# Patient Record
Sex: Female | Born: 1945
Health system: Southern US, Community
[De-identification: ages and names within clinical notes are randomized; demographics above are authoritative.]

## PROBLEM LIST (undated history)

## (undated) DIAGNOSIS — E785 Hyperlipidemia, unspecified: Secondary | ICD-10-CM

## (undated) DIAGNOSIS — H269 Unspecified cataract: Secondary | ICD-10-CM

## (undated) DIAGNOSIS — B3781 Candidal esophagitis: Secondary | ICD-10-CM

## (undated) DIAGNOSIS — I739 Peripheral vascular disease, unspecified: Secondary | ICD-10-CM

## (undated) DIAGNOSIS — K219 Gastro-esophageal reflux disease without esophagitis: Secondary | ICD-10-CM

## (undated) DIAGNOSIS — Z8489 Family history of other specified conditions: Secondary | ICD-10-CM

## (undated) DIAGNOSIS — I10 Essential (primary) hypertension: Secondary | ICD-10-CM

## (undated) DIAGNOSIS — R55 Syncope and collapse: Secondary | ICD-10-CM

## (undated) DIAGNOSIS — M65341 Trigger finger, right ring finger: Secondary | ICD-10-CM

## (undated) DIAGNOSIS — K220 Achalasia of cardia: Secondary | ICD-10-CM

## (undated) DIAGNOSIS — N179 Acute kidney failure, unspecified: Secondary | ICD-10-CM

## (undated) DIAGNOSIS — U071 COVID-19: Secondary | ICD-10-CM

## (undated) HISTORY — PX: ABDOMINAL HYSTERECTOMY: SHX81

## (undated) HISTORY — DX: Candidal esophagitis: B37.81

## (undated) HISTORY — DX: Unspecified cataract: H26.9

## (undated) HISTORY — DX: Hyperlipidemia, unspecified: E78.5

## (undated) HISTORY — PX: TOTAL HIP ARTHROPLASTY: SHX124

## (undated) HISTORY — DX: Peripheral vascular disease, unspecified: I73.9

## (undated) HISTORY — DX: Gastro-esophageal reflux disease without esophagitis: K21.9

## (undated) HISTORY — PX: BREAST BIOPSY: SHX20

## (undated) HISTORY — PX: UPPER GASTROINTESTINAL ENDOSCOPY: SHX188

---
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
## 1997-11-13 ENCOUNTER — Emergency Department (HOSPITAL_COMMUNITY): Admission: EM | Admit: 1997-11-13 | Discharge: 1997-11-13 | Payer: Self-pay | Admitting: Emergency Medicine

## 2002-10-29 ENCOUNTER — Ambulatory Visit (HOSPITAL_COMMUNITY): Admission: RE | Admit: 2002-10-29 | Discharge: 2002-10-29 | Payer: Self-pay | Admitting: Internal Medicine

## 2003-07-06 ENCOUNTER — Emergency Department (HOSPITAL_COMMUNITY): Admission: EM | Admit: 2003-07-06 | Discharge: 2003-07-06 | Payer: Self-pay

## 2005-10-18 ENCOUNTER — Ambulatory Visit: Payer: Self-pay | Admitting: Internal Medicine

## 2005-11-22 ENCOUNTER — Ambulatory Visit: Payer: Self-pay | Admitting: Internal Medicine

## 2005-11-22 ENCOUNTER — Encounter (INDEPENDENT_AMBULATORY_CARE_PROVIDER_SITE_OTHER): Payer: Self-pay | Admitting: *Deleted

## 2007-06-07 ENCOUNTER — Encounter: Admission: RE | Admit: 2007-06-07 | Discharge: 2007-06-07 | Payer: Self-pay | Admitting: Internal Medicine

## 2007-09-20 ENCOUNTER — Inpatient Hospital Stay (HOSPITAL_COMMUNITY): Admission: RE | Admit: 2007-09-20 | Discharge: 2007-09-26 | Payer: Self-pay | Admitting: Orthopedic Surgery

## 2007-09-25 ENCOUNTER — Ambulatory Visit: Payer: Self-pay | Admitting: Surgery

## 2007-09-25 ENCOUNTER — Encounter (INDEPENDENT_AMBULATORY_CARE_PROVIDER_SITE_OTHER): Payer: Self-pay | Admitting: Orthopedic Surgery

## 2008-04-08 ENCOUNTER — Emergency Department (HOSPITAL_COMMUNITY): Admission: EM | Admit: 2008-04-08 | Discharge: 2008-04-08 | Payer: Self-pay | Admitting: Emergency Medicine

## 2008-06-16 ENCOUNTER — Encounter: Admission: RE | Admit: 2008-06-16 | Discharge: 2008-06-16 | Payer: Self-pay | Admitting: Orthopedic Surgery

## 2008-10-30 ENCOUNTER — Encounter (INDEPENDENT_AMBULATORY_CARE_PROVIDER_SITE_OTHER): Payer: Self-pay | Admitting: *Deleted

## 2009-08-14 ENCOUNTER — Telehealth: Payer: Self-pay | Admitting: Internal Medicine

## 2009-12-01 DIAGNOSIS — D126 Benign neoplasm of colon, unspecified: Secondary | ICD-10-CM | POA: Insufficient documentation

## 2009-12-01 DIAGNOSIS — M1990 Unspecified osteoarthritis, unspecified site: Secondary | ICD-10-CM | POA: Insufficient documentation

## 2009-12-01 DIAGNOSIS — K219 Gastro-esophageal reflux disease without esophagitis: Secondary | ICD-10-CM | POA: Insufficient documentation

## 2010-03-05 DIAGNOSIS — M545 Low back pain, unspecified: Secondary | ICD-10-CM | POA: Insufficient documentation

## 2010-03-23 ENCOUNTER — Encounter (INDEPENDENT_AMBULATORY_CARE_PROVIDER_SITE_OTHER): Payer: Self-pay | Admitting: *Deleted

## 2010-05-08 ENCOUNTER — Ambulatory Visit: Payer: Self-pay | Admitting: Internal Medicine

## 2010-05-08 ENCOUNTER — Encounter (INDEPENDENT_AMBULATORY_CARE_PROVIDER_SITE_OTHER): Payer: Self-pay | Admitting: *Deleted

## 2010-05-08 DIAGNOSIS — E119 Type 2 diabetes mellitus without complications: Secondary | ICD-10-CM

## 2010-05-08 DIAGNOSIS — K220 Achalasia of cardia: Secondary | ICD-10-CM

## 2010-05-08 DIAGNOSIS — Z860100 Personal history of colon polyps, unspecified: Secondary | ICD-10-CM | POA: Insufficient documentation

## 2010-05-08 DIAGNOSIS — R109 Unspecified abdominal pain: Secondary | ICD-10-CM

## 2010-05-08 HISTORY — DX: Achalasia of cardia: K22.0

## 2010-06-08 DIAGNOSIS — F5104 Psychophysiologic insomnia: Secondary | ICD-10-CM | POA: Insufficient documentation

## 2010-06-19 ENCOUNTER — Telehealth: Payer: Self-pay | Admitting: Internal Medicine

## 2010-06-23 ENCOUNTER — Telehealth: Payer: Self-pay | Admitting: Internal Medicine

## 2010-06-24 ENCOUNTER — Ambulatory Visit: Payer: Self-pay | Admitting: Internal Medicine

## 2010-06-28 ENCOUNTER — Encounter: Payer: Self-pay | Admitting: Internal Medicine

## 2010-09-03 NOTE — Procedures (Signed)
Summary: Colonoscopy: Adenoma   Colonoscopy  Procedure date:  11/22/2005  Findings:      Comments: 1) Four (4) polyps removed from right colon. 2) Otherwise normal. 3) 17 min withdrawal time from cecum, about 5-6 mins involved in polyp removal.  ***MICROSCOPIC EXAMINATION AND DIAGNOSIS*** 2. COLON, POLYP(S): MULTIPLE FRAGMENTS OF ADENOMATOUS POLYP(S).   NO HIGH GRADE DYSPLASIA OR INVASIVE MALIGNANCY IDENTIFIED.   (BIOPSIES AT APPENDIX)    3. COLON, POLYP: TWO PORTIONS OF TUBULOVILLOUS ADENOMA(S), AND MULTIPLE FRAGMENTS OF ADENOMATOUS POLYPS. NO HIGH GRADE DYSPLASIA OR MALIGNANCY IDENTIFIED. (BIOPSIES, ASCENDING)  Procedures Next Due Date:    Colonoscopy: 11/2008  Patient Name: Meggie, Laseter. MRN:  Procedure Procedures: Colonoscopy CPT: [phone].    with biopsy. CPT: Q5068410.    with polypectomy. CPT: A3573898.  Personnel: Endoscopist: Iva Boop, MD, [facility].  Exam Location: Exam performed in Outpatient Clinic. Outpatient  Patient Consent: Procedure, Alternatives, Risks and Benefits discussed, consent obtained, from patient. Consent was obtained by the RN.  Indications Symptoms: Constipation Rectal Bleed.  History  Current Medications: Patient is not currently taking Coumadin.  Allergies: No known allergies.  Pre-Exam Physical: Performed Nov 22, 2005. Cardio-pulmonary exam, Rectal exam, HEENT exam , Abdominal exam, Mental status exam WNL.  Comments: Pt. history reviewed/updated, physical exam performed prior to initiation of sedation? YES Exam Exam: Extent of exam reached: Cecum, extent intended: Cecum.  The cecum was identified by appendiceal orifice and IC valve. Patient position: on left side. Images taken. ASA Classification: II.  Monitoring: Pulse and BP monitoring, Oximetry used. Supplemental O2 given.  Colon Prep Used MiraLax for colon prep. Prep results: excellent.  Sedation Meds: Patient assessed and found to be appropriate for moderate  (conscious) sedation. Residual sedation present from prior procedure today.  Versed 2 mg. given IV. Fentanyl 50 mcg. given IV.  Findings - NORMAL EXAM: Ascending Colon to Rectum.  MULTIPLE POLYPS: Ascending Colon. minimum size 2 mm, maximum size 7 mm. Procedure:  snare with cautery, removed, Polyp retrieved, 2 polyps Polyps sent to pathology. ICD9: Neoplasia, Benign, Large Bowel: 211.3.  MULTIPLE POLYPS: Cecum. minimum size 2 mm, maximum size 3 mm. Procedure:  biopsy without cautery, removed, Polyp retrieved, 2 polyps ICD9: Neoplasia, Benign, Large Bowel: 211.3.   Assessment  Diagnoses: 211.3: Neoplasia, Benign, Large Bowel.   Comments: 1) Four (4) polyps removed from right colon. 2) Otherwise normal. 3) 17 min withdrawal time from cecum, about 5-6 mins involved in polyp removal.  Events  Unplanned Interventions: No intervention was required.  Plans  Post Exam Instructions: No aspirin or non-steroidal containing medications: 2 weeks.  Comments: Prilosec OTC qd Disposition: After procedure patient sent to recovery. After recovery patient sent home.  Scheduling/Referral: Await pathology to schedule patient. Path Letter, to The Patient,  Colonoscopy, to Iva Boop, MD, [facility], 3- 5 yrs,  Clinic Visit, to Iva Boop, MD, [facility], 4-6 weeks,   CC:   Jarome Matin, MD  This report was created from the original endoscopy report, which was reviewed and signed by the above listed endoscopist.

## 2010-09-03 NOTE — Letter (Signed)
Summary: Patient Notice- Polyp Results  [facility]  [address] [place], [place] [address]   Phone: [phone]  Fax: [phone]        June 28, 2010 MRN: [id number]    Catherine Ramsey [address] [place], [place]  [address]    Dear [name],  The polyps removed from your colon were adenomatous. This means that they were pre-cancerous or that  they had the potential to change into cancer over time.   I recommend that you have a repeat colonoscopy in 3 years to determine if you have developed any new polyps over time and screen for colorectal cancer. If you develop any new rectal bleeding, abdominal pain or significant bowel habit changes, please contact us before then.  Please call us if you are having persistent problems or have questions about your condition that have not been fully answered at this time.  Sincerely,  Iva Boop MD, [facility]  This letter has been electronically signed by your physician.  Appended Document: Patient Notice- Polyp Results Letter mailed

## 2010-09-03 NOTE — Procedures (Signed)
Summary: EGD: H.Pylori   EGD  Procedure date:  11/22/2005  Findings:      Comments: 1) Distal esophageal stenosis, possibly related to dysphagia. Dilate with 54 Fr Maloney 2) Submucosal nodule in proximal stomach (biopsied) Doubt related to symptoms 3) Otherwise ok  Patient Name: Catherine Ramsey, Catherine Ramsey. MRN:  Procedure Procedures: Panendoscopy (EGD) CPT: 43235.    with biopsy(s)/brushing(s). CPT: [id number].    with [facility] Dilation of Esophagus Personnel: Endoscopist: Iva Boop, MD, [facility].  Exam Location: Exam performed in Outpatient Clinic. Outpatient  Patient Consent: Procedure, Alternatives, Risks and Benefits discussed, consent obtained, from patient. Consent was obtained by the RN.  Indications Symptoms: Dysphagia. Abdominal pain,  History  Current Medications: Patient is not currently taking Coumadin.  Allergies: No known allergies.  Comments: Periumbilical abdominal pain Pre-Exam Physical: Performed Nov 22, 2005  Cardio-pulmonary exam, HEENT exam, Abdominal exam, Mental status exam WNL.  Comments: Pt. history reviewed/updated, physical exam performed prior to initiation of sedation? YES Exam Exam Info: Maximum depth of insertion Duodenum, intended Duodenum. Patient position: on left side. Gastric retroflexion performed. Images taken. ASA Classification: II. Tolerance: excellent.  Sedation Meds: Patient assessed and found to be appropriate for moderate (conscious) sedation. Fentanyl 50 mcg. given IV. Versed 5 mg. given IV. Cetacaine Spray 2 sprays given aerosolized.  Monitoring: BP and pulse monitoring done. Oximetry used. Supplemental O2 given  Findings - Normal: Proximal Esophagus to Distal Esophagus.  NODULE: Maximum size: 5 mm. submucosal nodule in Fundus. Erosions not present. Biopsy/Nodule taken. Comments: hard .  STRICTURE / STENOSIS: Stenosis in Distal Esophagus.   - Dilation: Distal Esophagus. for dysphagia without stricture. Maloney dilator  used, Diameter: 18 mm, Minimal Resistance, No Heme present on extraction. Patient tolerance excellent. Comments: z-line at 40 cm.  - Normal: Body to Duodenal 2nd Portion. Comments: some patchy gastric erythema (non-specific).   Assessment  Comments: 1) Distal esophageal stenosis, possibly related to dysphagia. Dilate with 54 Fr Maloney 2) Submucosal nodule in proximal stomach (biopsied) Doubt related to symptoms 3) Otherwise ok Events  Unplanned Intervention: No unplanned interventions were required.  Plans Instructions: Clear or full liquids: clears until 1030 then soft today.  Disposition: After procedure patient sent to recovery. After recovery patient sent home.  Scheduling: Colonoscopy, to Iva Boop, MD, [facility], next   CC:   Jarome Matin, MD  This report was created from the original endoscopy report, which was reviewed and signed by the above listed endoscopist.     Appended Document: EGD: H.Pylori   EGD  Procedure date:  11/22/2005  Findings:      Comments: 1) Distal esophageal stenosis, possibly related to dysphagia. Dilate with 54 Fr Maloney 2) Submucosal nodule in proximal stomach (biopsied) Doubt related to symptoms 3) Otherwise ok   ***MICROSCOPIC EXAMINATION AND DIAGNOSIS***    1. STOMACH: CHRONIC GASTRITIS WITH HELICOBACTER PYLORI. NO INTESTINAL METAPLASIA, DYSPLASIA OR MALIGNANCY IDENTIFIED.   (BIOPSIES, FUNDUS)

## 2010-09-03 NOTE — Assessment & Plan Note (Signed)
Summary: lower abd pain,reflux...as.   History of Present Illness Visit Type: Initial Visit Primary GI MD: Stan Head MD [facility] Primary Adya Wirz: Jarome Matin, MD Chief Complaint: Abdominal pain upper region, dysphagia, and rectal bleeding History of Present Illness:   65 yo African-american woman with solid food dysphaga, especially meats. Helped by water and chewing well. Will regurgitate food bolus at times. Has had some heartburn at times but not much. Abdomen is also sore and achy most of the time. Has not been on a PPI in some time Remembers improvement after H. pyloi Rx 2007. Occasional constipation, chronically. Relieved with increased roughage.   GI Review of Systems    Reports abdominal pain, dysphagia with solids, and  weight gain.     Location of  Abdominal pain: upper abdomen.    Denies acid reflux, belching, bloating, chest pain, dysphagia with liquids, heartburn, loss of appetite, nausea, vomiting, vomiting blood, and  weight loss.      Reports constipation, hemorrhoids, and  rectal bleeding.     Denies anal fissure, black tarry stools, change in bowel habit, diarrhea, diverticulosis, fecal incontinence, heme positive stool, irritable bowel syndrome, jaundice, light color stool, liver problems, and  rectal pain. Preventive Screening-Counseling & Management  Alcohol-Tobacco     Smoking Status: never    Clinical Reports Reviewed:  Colonoscopy:  11/22/2005:  Comments: 1) Four (4) polyps removed from right colon. 2) Otherwise normal. 3) 17 min withdrawal time from cecum, about 5-6 mins involved in polyp removal.  ***MICROSCOPIC EXAMINATION AND DIAGNOSIS*** 2. COLON, POLYP(S): MULTIPLE FRAGMENTS OF ADENOMATOUS POLYP(S).   NO HIGH GRADE DYSPLASIA OR INVASIVE MALIGNANCY IDENTIFIED.   (BIOPSIES AT APPENDIX)    3. COLON, POLYP: TWO PORTIONS OF TUBULOVILLOUS ADENOMA(S), AND MULTIPLE FRAGMENTS OF ADENOMATOUS POLYPS. NO HIGH GRADE DYSPLASIA OR MALIGNANCY IDENTIFIED.  (BIOPSIES, ASCENDING)  EGD:  11/22/2005:  Comments: 1) Distal esophageal stenosis, possibly related to dysphagia. Dilate with 54 Fr Maloney 2) Submucosal nodule in proximal stomach (biopsied) Doubt related to symptoms 3) Otherwise ok   ***MICROSCOPIC EXAMINATION AND DIAGNOSIS***    1. STOMACH: CHRONIC GASTRITIS WITH HELICOBACTER PYLORI. NO INTESTINAL METAPLASIA, DYSPLASIA OR MALIGNANCY IDENTIFIED.   (BIOPSIES, FUNDUS)   Current Medications (verified): 1)  Humulin R 100 Unit/ml Soln (Insulin Regular Human) .... Take As Directed 2)  Lisinopril-Hydrochlorothiazide 10-12.5 Mg Tabs (Lisinopril-Hydrochlorothiazide) .Marland Kitchen.. 1 By Mouth Once Daily 3)  Klor-Con 8 Meq Cr-Tabs (Potassium Chloride) .Marland Kitchen.. 1 Two Times A Day 4)  Atenolol 50 Mg Tabs (Atenolol) .Marland Kitchen.. 1 By Mouth Two Times A Day 5)  Amlodipine Besylate 10 Mg Tabs (Amlodipine Besylate) .Marland Kitchen.. 1 By Mouth Once Daily 6)  Novolog Mix 70/30 70-30 % Susp (Insulin Aspart Prot & Aspart) .... Take As Directed  Allergies (verified): No Known Drug Allergies  Past History:  Past Medical History: Arthritis Diabetes II Hyperlipidemia Hypertension Obesity Colon adenomas 2007 GERD/esophageal dilation H. pylori gastritis 2007 Tx - Omep/Biaxin/Amox x 10 d  Past Surgical History: Hysterectomy - fibroids Hip Replacement  Rt.  Family History: Reviewed history and no changes required. Family History of Diabetes:  No FH of Colon Cancer:  Social History: Reviewed history and no changes required. Married 4 boys 4 boys Retired Patient has never smoked.  Alcohol Use - no Daily Caffeine Use Smoking Status:  never  Review of Systems       The patient complains of arthritis/joint pain, back pain, and change in vision.         occasional bluury vision, if blood sugars high All other  ROS negative except as per HPI.   Vital Signs:  Patient profile:   65 year old female Height:      67.5 inches Weight:      216 pounds BMI:     33.45 Pulse  rate:   76 / minute Pulse rhythm:   regular BP sitting:   162 / 84  (left arm)  Vitals Entered By: Milford Cage NCMA (May 08, 2010 1:45 PM)  Physical Exam  General:  obese.  NAD Eyes:   no icterus. Mouth:  upper dentures, missing molares lower and remaining teeth fair no mucosal lesions in mouth or pharynx Neck:  Supple; no masses or thyromegaly. Lungs:  Clear throughout to auscultation. Heart:  Regular rate and rhythm; no murmurs, rubs,  or bruits. Abdomen:  obese, soft, mildly tender diffusely in upper and periumbilical area no HSM or mass Rectal:  deferred until time of colonoscopy.   Extremities:  trace LE edema Neurologic:  Alert and  oriented x3 Cervical Nodes:  No significant cervical or supraclavicular adenopathy.  Psych:  Alert and cooperative. Normal mood and affect.   Impression & Recommendations:  Problem # 1:  ABDOMINAL PAIN, UPPER (ICD-789.09) Assessment New  Suspect acid-peptic in origin. start PPI and EGD if unhelpful consider other work-up other possibilities include functional and/or neuropathic - does have mild paresthesias in LE's but has not had retinopathy/ laser Tx  Orders: Endo Savary  Dil/Colon (Endo Sav Dil/Col)  Problem # 2:  OTHER DYSPHAGIA (ICD-787.29)  Recurent problem though last seen and treated for this in 2007. She had responded to PPI and dilation of esophagel stenosis then. Restart PPI EGD/dili Risks, benefits,and indications of endoscopic procedure(s) were reviewed with the patient and all questions answered.  Orders: Endo Savary  Dil/Colon (Endo Sav Dil/Col)  Problem # 3:  TUBULOVILLOUS ADENOMA, COLON, HX OF (ICD-V12.72)  Orders: Endo Savary  Dil/Colon (Endo Sav Dil/Col)  Problem # 4:  SCREENING, COLON CANCER (ICD-V76.51)  Orders: Endo Savary  Dil/Colon (Endo Sav Dil/Col)  Patient Instructions: 1)  Please pick up your medications at your pharmacy. MOVIPREP 2)  We will see you at your procedure on 06/24/10 3)   Begin Omeprazole is directed below. 4)  [facility] Patient Information Guide given to patient.  5)  Colonoscopy and Flexible Sigmoidoscopy brochure given.  6)  Upper Endoscopy with Dilatation brochure given.  7)  Copy sent to : Jarome Matin, MD 8)  The medication list was reviewed and reconciled.  All changed / newly prescribed medications were explained.  A complete medication list was provided to the patient / caregiver. Prescriptions: MOVIPREP 100 GM  SOLR (PEG-KCL-NACL-NASULF-NA ASC-C) As per prep instructions.  #1 x 0   Entered by:   Francee Piccolo CMA (AAMA)   Authorized by:   Iva Boop MD, [facility]   Signed by:   Francee Piccolo CMA (AAMA) on 05/08/2010   Method used:   [facility]  [address] [phone]* (retail)       [address]       [place], [place]  [address]       Ph: [phone] or [phone]       Fax: [phone]   RxID:   [id number] OMEPRAZOLE 40 MG CPDR (OMEPRAZOLE) 1 by mouth once daily take 30-60 minutes before breakfast  #30 x 2   Entered and Authorized by:   Iva Boop  MD, [facility]   Signed by:   Iva Boop MD, FACG on 05/08/2010   Method used:   [facility]        [facility]  [address] [phone]* (retail)       [address]       [place], [place]  [address]       Ph: [phone] or [phone]       Fax: [phone]   RxID:   [id number]

## 2010-09-03 NOTE — Progress Notes (Signed)
Summary: Procedure questions  Phone Note Call from Patient Call back at Home Phone [phone]   Caller: Patient Call For: Dr. Leone Payor Summary of Call: PT wants to speak with nurse about procedures, wants to only follow through with one or the other due to insurance, is not employed and scared of medical bills Initial call taken by: Raechel Chute,  June 19, 2010 3:12 PM  Follow-up for Phone Call        patient states she is doing better with her swallowing.  She states she chews her meat very well and is doing much better.  She is worried about other medical bills and want to proceed with the colon only.  I have canceled the EGD at her request. Follow-up by: Darcey Nora RN, CGRN,  June 19, 2010 3:35 PM  Additional Follow-up for Phone Call Additional follow up Details #1::        ok  Additional Follow-up by: Iva Boop MD, [facility],  June 19, 2010 4:02 PM

## 2010-09-03 NOTE — Progress Notes (Signed)
Summary: Schedule Colonoscopy  Phone Note Outgoing Call Call back at Home Phone [phone]   Call placed by: Harlow Mares CMA Duncan Dull),  August 14, 2009 10:11 AM Call placed to: Patient Summary of Call: patient states that she is disabilied and she will be getting MCD in May so she asked if i would call her back in late May or early June. I will send myself a reminder to call her when we get that schedule and she has MCD. She is on Insulin but can be done as a direct since the previsit nurses have standing orders for insulin patients now.  Initial call taken by: Harlow Mares CMA (AAMA),  August 14, 2009 10:13 AM

## 2010-09-03 NOTE — Letter (Signed)
Summary: Diabetic Instructions  [facility]  [address] [place], [place] [address]   Phone: [phone]  Fax: [phone]    JALIZA SEIFRIED July 20, 1946 MRN: [id number]    _X_   INSULIN (LONG ACTING) MEDICATION INSTRUCTIONS (Lantus, NPH, 70/30, Humulin, Novolin-N)   The day before your procedure:   Take  your regular evening dose    The day of your procedure:   Do not take your morning dose    _X_   INSULIN (SHORT ACTING) MEDICATION INSTRUCTIONS (Regular, Humulog, Novolog)   The day before your procedure:   Do not take your evening dose   The day of your procedure:   Do not take your morning dose

## 2010-09-03 NOTE — Progress Notes (Signed)
Summary: Prep meds too expensive  Phone Note Call from Patient Call back at Home Phone [phone] Call back at or cell [phone]   Call For: Dr Leone Payor Summary of Call: Wonders if she can have the generic alternative to the Moviprep that was ordered for her procedure tomorrow? [facility] [address] Initial call taken by: Leanor Kail [facility],  June 23, 2010 8:25 AM  Follow-up for Phone Call        Spoke to pt.  Advised to drink half of Colyte tonight and 1/2 tomorrow following Moviprep instruction times.  Pt to take 2 dulcolax 2 hours prior to first dose of Colyte.  Pt voices understanding.  Prep and Dulcolax sent to pharm. Follow-up by: Francee Piccolo CMA Duncan Dull),  June 23, 2010 10:31 AM    New/Updated Medications: COLYTE WITH FLAVOR PACKS 240 GM  SOLR (PEG 3350-KCL-NABCB-NACL-NASULF) As per prep instructions. DULCOLAX 5 MG  TBEC (BISACODYL) Day before procedure take 2 at 3pm Prescriptions: DULCOLAX 5 MG  TBEC (BISACODYL) Day before procedure take 2 at 3pm  #2 x 0   Entered by:   Francee Piccolo CMA (AAMA)   Authorized by:   Iva Boop MD, [facility]   Signed by:   Francee Piccolo CMA (AAMA) on 06/23/2010   Method used:   [facility]        [facility]  [address] [phone]* (retail)       [address]       [place], [place]  [address]       Ph: [phone] or [phone]       Fax: [phone]   RxID:   [id number] COLYTE WITH FLAVOR PACKS 240 GM  SOLR (PEG 3350-KCL-NABCB-NACL-NASULF) As per prep instructions.  #1 x 0   Entered by:   Francee Piccolo CMA (AAMA)   Authorized by:   Iva Boop MD, [facility]   Signed by:   Francee Piccolo CMA (AAMA) on 06/23/2010   Method used:   [facility]        [facility]  [address] [phone]* (retail)       [address]       [place], [place]  [address]       Ph: [phone] or [phone]       Fax: [phone]   RxID:   [id number]

## 2010-09-03 NOTE — Letter (Signed)
Summary: [facility] Instructions  [facility]  [address] [place], [place] [address]   Phone: [phone]  Fax: [phone]       Catherine Ramsey    12-31-1945    MRN: [id number]      Procedure Day Dorna BloomLulu Riding, 06/24/10     Arrival Time: 10:30 AM      Procedure Time: 11:30 AM    Location of Procedure:                    _X_  [facility] (4th Floor)                     PREPARATION FOR COLONOSCOPY WITH MOVIPREP   Starting 5 days prior to your procedure 06/19/10 do not eat nuts, seeds, popcorn, corn, beans, peas,  salads, or any raw vegetables.  Do not take any fiber supplements (e.g. Metamucil, Citrucel, and Benefiber).  THE DAY BEFORE YOUR PROCEDURE         TUESDAY, 06/23/10  1.  Drink clear liquids the entire day-NO SOLID FOOD  2.  Do not drink anything colored red or purple.  Avoid juices with pulp.  No orange juice.  3.  Drink at least 64 oz. (8 glasses) of fluid/clear liquids during the day to prevent dehydration and help the prep work efficiently.  CLEAR LIQUIDS INCLUDE: Water Jello Ice Popsicles Tea (sugar ok, no milk/cream) Powdered fruit flavored drinks Coffee (sugar ok, no milk/cream) Gatorade Juice: apple, white grape, white cranberry  Lemonade Clear bullion, consomm, broth Carbonated beverages (any kind) Strained chicken noodle soup Hard Candy                           4.  In the morning, mix first dose of MoviPrep solution:    Empty 1 Pouch A and 1 Pouch B into the disposable container    Add lukewarm drinking water to the top line of the container. Mix to dissolve    Refrigerate (mixed solution should be used within 24 hrs)  5.  Begin drinking the prep at 5:00 p.m. The MoviPrep container is divided by 4 marks.   Every 15 minutes drink the solution down to the next mark (approximately 8 oz) until the full liter is complete.   6.  Follow completed prep with 16 oz of clear liquid of your choice (Nothing red or purple).   Continue to drink clear liquids until bedtime.  7.  Before going to bed, mix second dose of MoviPrep solution:    Empty 1 Pouch A and 1 Pouch B into the disposable container    Add lukewarm drinking water to the top line of the container. Mix to dissolve    Refrigerate  THE DAY OF YOUR PROCEDURE      WEDNESDAY, 06/24/10  Beginning at 6:30 a.m. (5 hours before procedure):         1. Every 15 minutes, drink the solution down to the next mark (approx 8 oz) until the full liter is complete.  2. Follow completed prep with 16 oz. of clear liquid of your choice.    3. You may drink clear liquids until 9:30AM (2 HOURS BEFORE PROCEDURE).  MEDICATION INSTRUCTIONS  Unless otherwise instructed, you should take regular prescription medications with a small sip of water   as early as possible the morning of your procedure.  Diabetic patients - see separate instructions.      OTHER  INSTRUCTIONS  You will need a responsible adult at least 63 years of age to accompany you and drive you home.   This person must remain in the waiting room during your procedure.  Wear loose fitting clothing that is easily removed.  Leave jewelry and other valuables at home.  However, you may wish to bring a book to read or  an iPod/MP3 player to listen to music as you wait for your procedure to start.  Remove all body piercing jewelry and leave at home.  Total time from sign-in until discharge is approximately 2-3 hours.  You should go home directly after your procedure and rest.  You can resume normal activities the  day after your procedure.  The day of your procedure you should not:   Drive   Make legal decisions   Operate machinery   Drink alcohol   Return to work  You will receive specific instructions about eating, activities and medications before you leave.  The above instructions have been reviewed and explained to me by   _______________________   I fully understand and can  verbalize these instructions _____________________________ Date _________

## 2010-09-03 NOTE — Letter (Signed)
Summary: New Patient letter  [facility]  [address] [place], [place] [address]   Phone: [phone]  Fax: [phone]       03/23/2010 MRN: [id number]  Catherine Ramsey [address] [place], [place]  [address]  Dear Ms. Laural Benes,  Welcome to the Gastroenterology Division at [facility].    You are scheduled to see Dr. Leone Payor on 05/08/2010 at 1:45PM on the 3rd floor at [facility], [address]  We ask that you try to arrive at our office 15 minutes prior to your appointment time to allow for check-in.  We would like you to complete the enclosed self-administered evaluation form prior to your visit and bring it with you on the day of your appointment.  We will review it with you.  Also, please bring a complete list of all your medications or, if you prefer, bring the medication bottles and we will list them.  Please bring your insurance card so that we may make a copy of it.  If your insurance requires a referral to see a specialist, please bring your referral form from your primary care physician.  Co-payments are due at the time of your visit and may be paid by cash, check or credit card.     Your office visit will consist of a consult with your physician (includes a physical exam), any laboratory testing he/she may order, scheduling of any necessary diagnostic testing (e.g. x-ray, ultrasound, CT-scan), and scheduling of a procedure (e.g. Endoscopy, Colonoscopy) if required.  Please allow enough time on your schedule to allow for any/all of these possibilities.    If you cannot keep your appointment, please call [phone] to cancel or reschedule prior to your appointment date.  This allows [place] the opportunity to schedule an appointment for another patient in need of care.  If you do not cancel or reschedule by 5 p.m. the business day prior to your appointment date, you will be charged a $50.00 late cancellation/no-show fee.    Thank you for choosing  [facility] for your medical needs.  We appreciate the opportunity to care for you.  Please visit [place] at our website  to learn more about our practice.                     Sincerely,                                                             The Gastroenterology Division

## 2010-09-03 NOTE — Procedures (Signed)
Summary: Colonoscopy  Patient: Catherine Ramsey Note: All result statuses are Final unless otherwise noted.  Tests: (1) Colonoscopy (COL)   COL Colonoscopy           DONE     [facility]     [address]     [place], [place]  [address]           COLONOSCOPY PROCEDURE REPORT           PATIENT:  [name], [name]  MR#:  [id number]     BIRTHDATE:  06/13/1946, 64 yrs. old  GENDER:  female     ENDOSCOPIST:  Iva Boop, MD, [facility]           PROCEDURE DATE:  06/24/2010     PROCEDURE:  Colonoscopy with biopsy and snare polypectomy     ASA CLASS:  Class II     INDICATIONS:  surveillance and high-risk screening, history of     pre-cancerous (adenomatous) colon polyps 4 polyps (2 TV adenoma     and 2 adenoma) max size 7 mm removed 2007     MEDICATIONS:   Fentanyl 75 mcg IV, Versed 6 mg           DESCRIPTION OF PROCEDURE:   After the risks benefits and     alternatives of the procedure were thoroughly explained, informed     consent was obtained.  Digital rectal exam was performed and     revealed no abnormalities.   The LB PCF-[id number] endoscope     was introduced through the anus and advanced to the cecum, which     was identified by both the appendix and ileocecal valve, without     limitations.  The quality of the prep was excellent, using     MoviPrep.  The instrument was then slowly withdrawn as the colon     was fully examined. Insertion: 8:53 minutes Withdrawal: 9:36     minutes     <<PROCEDUREIMAGES>>           FINDINGS:  Five polyps were found. All diminutive (5mm or less)     cecum (1), ascending (2), transverse (2). The polyps were removed     using cold biopsy forceps. Polyps were snared without cautery.     Retrieval was successful.  This was otherwise a normal examination     of the colon.   Retroflexed views in the rectum revealed no     abnormalities.    The scope was then withdrawn from the patient     and the procedure completed.        COMPLICATIONS:  None     ENDOSCOPIC IMPRESSION:     1) Five diminutive polyps removed     2) Otherwise normal examination with excellent prep     3) personal history of tubulovillous adenomas and tubular     adenomas (4) 2007.           REPEAT EXAM:  In for Colonoscopy, pending biopsy results.           Iva Boop, MD, [facility]           CC:  The Patient     Jarome Matin, MD           n.     Rosalie Doctor:   Iva Boop at 06/24/2010 01:00 PM           [place] No, [id number]  Note: An exclamation mark (!) indicates a result  that was not dispersed into the flowsheet. Document Creation Date: 06/24/2010 1:00 PM _______________________________________________________________________  (1) Order result status: Final Collection or observation date-time: 06/24/2010 12:46 Requested date-time:  Receipt date-time:  Reported date-time:  Referring Physician:   Ordering Physician: Stan Head [phone]) Specimen Source:  Source: Launa Grill Order Number: [id number] Lab site:   Appended Document: Colonoscopy   Colonoscopy  Procedure date:  06/24/2010  Findings:          1) Five diminutive polyps removed     2) Otherwise normal examination with excellent prep     3) personal history of tubulovillous adenomas and tubular     adenomas (4) 2007.  1. Colon, polyp(s), cecum, ascending (2), transverse (2) :  TUBULAR ADENOMAS AND A SERRATED ADENOMA.  NO HIGH GRADE DYSPLASIA OR MALIGNANCY IDENTIFIED.  Comments:      Repeat colonoscopy in 3 years.     Procedures Next Due Date:    Colonoscopy: 07/2013

## 2010-10-13 LAB — GLUCOSE, CAPILLARY
Glucose-Capillary: 178 mg/dL — ABNORMAL HIGH (ref 70–99)
Glucose-Capillary: 224 mg/dL — ABNORMAL HIGH (ref 70–99)

## 2010-12-15 NOTE — Op Note (Signed)
Catherine Ramsey, Catherine Ramsey               ACCOUNT NO.:  [id number]   MEDICAL RECORD NO.:  [id number]          PATIENT TYPE:  INP   LOCATION:  0005                         FACILITY:  [facility]   PHYSICIAN:  Ollen Gross, M.D.    DATE OF BIRTH:  Jul 14, 1946   DATE OF PROCEDURE:  09/20/2007  DATE OF DISCHARGE:                               OPERATIVE REPORT   PREOPERATIVE DIAGNOSIS:  Post-traumatic osteoarthritis right hip.   POSTOPERATIVE DIAGNOSIS:  Post-traumatic osteoarthritis right hip.   PROCEDURE:  Conversion of previous hip surgery to right total hip  arthroplasty.   SURGEON:  Ollen Gross, M.D.   ASSISTANT:  Avel Peace PA-C.   ANESTHESIA:  General.   ESTIMATED BLOOD LOSS:  400 mL.   DRAIN:  Hemovac x1.   COMPLICATIONS:  None.   CONDITION:  Stable to recovery room.   BRIEF CLINICAL NOTE:  Catherine Ramsey is a 65 year old female with severe end-  stage arthritic changes of the right hip.  She had a trauma many years  ago requiring hip pinning.  She had Knowles hip pins placed.  She healed  uneventfully but went on to develop severe post-traumatic arthritis.  She presents now for conversion of her previous operation to a total hip  arthroplasty.   PROCEDURE IN DETAIL:  After successful initiation of general anesthetic,  the patient is placed in the left lateral decubitus position with the  right side up and held with the hip positioner.  Right lower extremity  is isolated from her perineum with plastic drapes, prepped and draped in  the usual sterile fashion.  Standard posterolateral incision was made  with a 10 blade through subcutaneous tissue to the level of the fascia  lata which was incised in line with the skin incision.  Sciatic nerve  was palpated and protected and short rotators isolated off the femur.  Capsulectomy is performed and I attempted to dislocate the hip.  It  could not be dislocated due to severe ankylosis.  I then made an  incision in the fascia of the vastus  lateralis to find the heads for the  Knowles pins.  We found them and then I removed each of the pins without  any adverse effects.   Since we could not dislocate the hip, I cut the femoral head in situ.  The head is cut and then separated from the femoral neck and shaft.  I  retracted the neck and shaft anteriorly and then removed the head from  the acetabulum with the corkscrew device.  We used the head for bone  graft.  The retractors were placed around the acetabulum and then the  labrum and osteophytes removed.  Acetabular reaming begins at 45 mm  coursing increments of 2 up to 53 mm.  She was in protrusio to begin  with so I used some of the reamings and some of our graft from the  femoral head in order to graft medially to build the cup out to a normal  position.  A 54 mL pinnacle acetabular shell was then impacted in  anatomic position and transfixed with two dome  screws.  The apex hole  eliminator was placed and the permanent 36 mm neutral Ultramet metal  liner was placed for a metal-on-metal hip replacement.   The femur was then prepared with the canal finder and irrigation.  Axial  reaming was performed to 17.5 mm, proximal reaming to  a 22B and the  sleeve machine to a large.  A 22B large trial sleeve was placed with 22  x 17 stem and a 36 standard neck about 10 degrees beyond her native  anteversion.  The 36 + 0  trial head is placed in the hips reduced with  great stability.  There was full extension, full external rotation, 70  degrees flexion, 40 degrees adduction, 90 degrees internal rotation, 90  degrees of flexion and 70 degrees of internal rotation.  By placing the  right leg on top of the left I felt as though leg lengths were equal.  Hips then dislocated.  Trials removed.  The permanent 22B large sleeve  is placed and 22 x 17 stem and 36 standard neck about 10 degrees beyond  native anteversion.  Once the stem was impacted, then the permit 36 + 0  head is placed  and the hip is reduced in same stability parameters.  Wound was copiously irrigated with saline solution and the short  rotators reattached to the femur through drill holes.  Fascia lata was  closed over Hemovac drain with interrupted #1 Vicryl, subcu closed with  #1-0 and #2-0 Vicryl and subcuticular running 4-0 Monocryl.  Incision  was cleaned and dried and Steri-Strips and a bulky sterile dressing are  applied.  The patient is then placed into the knee immobilizer, awakened  and transported to recovery in stable condition.      Ollen Gross, M.D.  Electronically Signed     FA/MEDQ  D:  09/20/2007  T:  09/21/2007  Job:  [id number]

## 2010-12-15 NOTE — H&P (Signed)
Catherine Ramsey, Catherine Ramsey NO.:  [id number]   MEDICAL RECORD NO.:  [id number]          PATIENT TYPE:  INP   LOCATION:  0005                         FACILITY:  [facility]   PHYSICIAN:  Ollen Gross, M.D.    DATE OF BIRTH:  07-31-1946   DATE OF ADMISSION:  09/20/2007  DATE OF DISCHARGE:                              HISTORY & PHYSICAL   NOTE:  Date of office visit history and physical was performed on  September 15, 2007.  Date of admission is September 20, 2007.   CHIEF COMPLAINT:  Right hip pain.   HISTORY OF PRESENT ILLNESS:  The patient is a 65 year old female seen by  Dr. Lequita Halt for ongoing right hip pain.  She has known end-stage  arthritis.  The pain has been getting progressively worse over time.  She had a previous hip surgery with a hip pinning years ago at age 46  which sounds like it was for a slipped capital epiphysis.  She has  developed arthritis beyond that.  She does have three Knowles pins in.  Unfortunately she has gone on to develop arthritis and has reached the  point where she would like to proceed with surgical intervention.  Risks  and benefits have been discussed and surgery will be performed by Dr.  Ollen Gross.   ALLERGIES:  No known drug allergies.   CURRENT MEDICATIONS:  Lantus, NovoLog, atenolol, amlodipine, quinapril,  potassium.   PAST MEDICAL HISTORY:  Arthritis, hypertension, insulin dependent  diabetes mellitus.   PAST SURGICAL HISTORY:  Hip pinning around 1962, hysterectomy 1989,  colonoscopy 2007, also lumpectomy.   SOCIAL HISTORY:  Married, works as a Engineer, structural, nonsmoker.  No alcohol.  Eight children and family will be assisting with care after surgery.   FAMILY HISTORY:  Father with diabetes.  Has a son with diabetes and a  daughter with kidney disease.   REVIEW OF SYSTEMS:  GENERAL:  No fevers, chills, night sweats.  NEUROLOGICAL:  No seizures, syncope or paralysis.  RESPIRATORY:  No  shortness of breath, productive cough  or hemoptysis.  CARDIOVASCULAR:  No chest pain, orthopnea.  GI:  No nausea, vomiting, diarrhea or  constipation.  GU:  No dysuria, hematuria or discharge.  MUSCULOSKELETAL:  Right hip.   PHYSICAL EXAMINATION:  VITAL SIGNS:  Pulse 74, respirations 12, blood  pressure 160/80.  GENERAL:  A 65 year old African American female well-nourished, well-  developed, slightly overweight.  She is alert and cooperative and  pleasant.  HEENT:  Normocephalic, atraumatic.  Pupils are round and reactive.  EOMs  intact.  NECK:  Neck is supple.  No carotid bruits.  CHEST:  Clear.  Anterior and posterior chest reveals no rhonchi, rales  or wheezes.  She did have a slight kyphotic thoracic spine.  HEART:  Regular rate and rhythm with grade 2/6 systolic ejection murmur  best heard over aortic point.  ABDOMEN:  Soft, slightly round.  Bowel sounds present.  BREASTS:  Not done.  RECTAL:  Not done.  GENITALIA:  Not done.  EXTREMITIES:  Right hip flexion 90 to zero, internal rotation 10  degrees, external  rotation about 10 degrees of abduction and she does  ambulate with an antalgic gait.   IMPRESSION:  1. Osteoarthritis right hip.  2. Hypertension.  3. Insulin dependent diabetes mellitus.   PLAN:  The patient was admitted to [facility] to undergo  removal of the Knowles pins and conversion over to a right total hip  arthroplasty.  Surgery will be performed by Dr. Ollen Gross.      Alexzandrew L. Perkins, P.A.C.      Ollen Gross, M.D.  Electronically Signed    ALP/MEDQ  D:  09/20/2007  T:  09/20/2007  Job:  [id number]

## 2010-12-18 NOTE — Discharge Summary (Signed)
[name], [name]               ACCOUNT NO.:  [id number]   MEDICAL RECORD NO.:  [id number]          PATIENT TYPE:  INP   LOCATION:  1613                         FACILITY:  [facility]   PHYSICIAN:  Ollen Gross, M.D.    DATE OF BIRTH:  08/30/1945   DATE OF ADMISSION:  09/20/2007  DATE OF DISCHARGE:  09/26/2007                               DISCHARGE SUMMARY   ADMISSION DIAGNOSES:  1. Osteoarthritis of the right hip.  2. Hypertension.  3. Insulin-dependent diabetes mellitus.   DISCHARGE DIAGNOSES:  1. Osteoarthritis right hip posttraumatic status post conversion      previous hip surgery over to right total hip arthroplasty. 2.      Postop blood loss anemia.  2. Status post transfusion without sequelae.  3. Postop hyponatremia improved.  4. Postop fevers likely viral syndrome.  5. Hypertension.  6. Insulin-dependent diabetes mellitus.   PROCEDURE:  September 20, 2007 conversion of previous hip surgery over to  right total hip surgery.  Surgeon Dr. Lequita Halt, assisting Avel Peace PA-  C.  Anesthesia general.   CONSULTATIONS:  Medical Dr. Jarome Matin.   BRIEF HISTORY:  Catherine Ramsey is a 65 year old female with severe history of  arthritis of the right hip.  She had a trauma many years ago requiring  hip pinning.  She had pins placed.  Eventually healed but went on to  develop severe posttraumatic arthritis and now plans for conversion over  to a total hip.   LABORATORY DATA:  Preop CBC:  Hemoglobin 11.5, hematocrit 34.7, white  cell count 6.5, platelets 176, hemoglobin dropped down to 9.4, then 8.9,  back up to 9, and then as low as 8.2.  Given 2 units of blood.  Postoperatively hemoglobin back up to 10.3 and __________ 29.6.  PT/PTT  preop 14.3 and 25, respectively.  INR 1.1.  Serial protimes followed.  PT/INR 32.8 and 3.1.  D-dimers taken on September 24, 2007 elevated to  2.34.  Chem panel on admission:  Low albumin 3.3, elevated glucose 352,  known diabetic.  Remaining chem  panel within normal limits.  Serial  BMET's were followed.  Sodium did drop from 140 down to 132, back up to  138.  Glucose came down to 251 and then 143.  Last noted normal level  82.  Creatinine went up from 1.07 to 1.3 back down to 1.1.  B-type  natriuretic peptide taken on September 22, 2007 less than 30.  Preop UA:  Positive glucose, few bacteria, 0-2 red, 0-2 white cells; otherwise,  negative. Follow-up UA:  Large hemoglobin 0-2 white cells, 11-20 red  cells, rare bacteria.  Blood group type B+.  Blood cultures x2 on  September 22, 2007 no growth on five days.  Urine culture no growth.  Influenza type A and type B both negative.   Doppler ultrasound:  No evidence of lower extremity DVT or superficial  thrombosis, right lower extremity difficulty due to suboptimal  positioning, fluid-filled area consistent with ruptured Baker's cyst.  EKG September 13, 2007:  Normal sinus rhythm, moderate voltage criteria  for LVH.  No change since last tracing  of July 06, 2003 confirmed by  Dr. Lady Deutscher.  Preop hip films September 13, 2007:  Advanced  osteoarthritis right hip.  Two-view chest preop September 13, 2007:  No  active cardiopulmonary disease.  Portable chest and hip on September 20, 2007:  Right hip arthroplasty without evidence of complication.  Two-  view chest taken on September 22, 2007:  No active disease.  Follow-up  chest on September 23, 2007:  Mild cardiac enlargement without an acute  pulmonary process.  CT chest September 24, 2007:  No evidence of acute  pulmonary embolus, small nodule in the right lung felt to be benign,  gastroesophageal reflux, hiatal hernia.   HOSPITAL COURSE:  The patient admitted to [facility].  Tolerated the procedure well.  Later transferred to the recovery room of  the orthopedic floor.  Started on the PCA and p.o. analgesic pain  control following surgery.  Did run a temperature on the evening of  surgery of 102.4 back down to 99 on the  morning of day #1.  Encouraged  incentive spirometer.  Unable to get much rest.  A little bit of itching  through the night was a little bit better.  If the temp spiked again we  would do further workup.  Had a normal white count of 7.7.  Started on  iron since hemoglobin was down to 9.4.  Low urinary output.  Did a  little bit of mild diuresis with Lasix.  Pressure was up.  The evening  of surgery had to be given clonidine. Started back on her home  medications.  She is a diabetic.  Started on sliding scale insulin for  diabetic control.  By day  #2 doing a little bit better, but spiked again up to 102.5.  Ordered  chest x-ray.  White count went up from 7 to 12.2 with a little bit of  leukocytosis and spiked a temp.  Did chest x-ray and UA.  Chest x-ray  did prove to be negative.  UA also negative.  Still had some positive  volume so we checked a BNP which was less than 30 and was normal.  Sodium dropped probably due to dilutional component, but that was  stable.  Dressing change incision looked good.  Started getting up out  of bed with therapy slowly progressing.  On the following day she was  still spiking temps so we called in a consult.  The patient was seen in  consultation by Dr. Jarome Matin who felt that most likely it was of  some type of acute viral syndrome.  Checked influenza A and B rapid  test, and those were both negative.  Repeated chest x-ray to rule out  early pneumonia, but that was also negative again.  Checked a D-dimer  the next day which was elevated.  On day #3 DC'd the Foley.  From a  therapy standpoint she was moving very slow and only walking short  distances.  Ordered a CT chest angiogram to rule out PE that was  negative.  Doppler studies were ordered, but were found to be negative  for blood clots but did show ruptured Baker's cyst with some fluid area.  Due to the negative workup it was felt that it was likely due to a viral  syndrome of a noninfectious  cause.  Continued to work with therapy, and  by September 25, 2007 the patient was doing much better.  Dressings were  changed starting on day #2.  Checked every day.  Incision was healing  well.  By September 25, 2007 she was walking 100 feet.  Incision  continued to heal.  Fevers with spikes initially in the first part of  the hospital course had improved back down once Doppler proved to be  negative.  The CT was proved to be negative.  It was felt that she was  stable.  She was discharged home later that day on September 25, 2007.   DISCHARGE PLAN:  1. The patient discharged home on September 25, 2007.  2. Discharge Diagnoses, please see above.  3. Discharge medications:  Coumadin, Percocet, Robaxin, Nu-Iron.   ACTIVITY:  Partial weightbearing right lower extremity.  Home health PT,  home health nursing, 25-50% weightbearing, hip precautions, total hip  protocol.   FOLLOWUP:  Follow up two weeks.   DIET:  Diabetic heart-healthy diet.   DISPOSITION:  Home.   CONDITION ON DISCHARGE:  Improved.      Alexzandrew L. Perkins, P.A.C.      Ollen Gross, M.D.  Electronically Signed    ALP/MEDQ  D:  11/06/2007  T:  11/06/2007  Job:  [id number]   cc:   Barry Dienes. Eloise Harman, M.D.  Fax: [phone]

## 2011-04-23 LAB — COMPREHENSIVE METABOLIC PANEL
ALT: 20
AST: 16
Albumin: 3.3 — ABNORMAL LOW
Alkaline Phosphatase: 88
BUN: 13
CO2: 29
Calcium: 9.2
Chloride: 105
Creatinine, Ser: 1.07
GFR calc Af Amer: >60
GFR calc non Af Amer: 52 — ABNORMAL LOW
Glucose, Bld: 352 — ABNORMAL HIGH
Potassium: 4.2
Sodium: 140
Total Bilirubin: 0.8
Total Protein: 6.7

## 2011-04-23 LAB — URINALYSIS, ROUTINE W REFLEX MICROSCOPIC
Bilirubin Urine: NEGATIVE
Bilirubin Urine: NEGATIVE
Glucose, UA: >1000 — AB
Glucose, UA: NEGATIVE
Hgb urine dipstick: NEGATIVE
Ketones, ur: NEGATIVE
Leukocytes, UA: NEGATIVE
Nitrite: NEGATIVE
Protein, ur: NEGATIVE
Protein, ur: NEGATIVE
Specific Gravity, Urine: 1.03
Urobilinogen, UA: 1
pH: 6

## 2011-04-23 LAB — BASIC METABOLIC PANEL
BUN: 13
BUN: 15
BUN: 17
CO2: 24
CO2: 28
CO2: 29
Calcium: 7.7 — ABNORMAL LOW
Calcium: 7.8 — ABNORMAL LOW
Chloride: 100
Creatinine, Ser: 1.29 — ABNORMAL HIGH
Creatinine, Ser: 1.34 — ABNORMAL HIGH
GFR calc Af Amer: 51 — ABNORMAL LOW
GFR calc non Af Amer: 42 — ABNORMAL LOW
Glucose, Bld: 110 — ABNORMAL HIGH
Glucose, Bld: 251 — ABNORMAL HIGH
Glucose, Bld: 82
Potassium: 3.9
Potassium: 4.2
Sodium: 132 — ABNORMAL LOW
Sodium: 138

## 2011-04-23 LAB — CBC
HCT: 34.7 — ABNORMAL LOW
HCT: 27.8 — ABNORMAL LOW
HCT: 29.6 — ABNORMAL LOW
Hemoglobin: 11.5 — ABNORMAL LOW
Hemoglobin: 10.3 — ABNORMAL LOW
Hemoglobin: 9.4 — ABNORMAL LOW
MCHC: 33.2
MCHC: 33.8
MCHC: 34.2
MCHC: 34.3
MCV: 83.9
MCV: 83.3
MCV: 83.8
Platelets: 176
Platelets: 123 — ABNORMAL LOW
Platelets: 129 — ABNORMAL LOW
Platelets: 136 — ABNORMAL LOW
Platelets: 137 — ABNORMAL LOW
RBC: 4.14
RDW: 13
RDW: 13
RDW: 13.3
RDW: 13.6
RDW: 13.6
WBC: 6.5
WBC: 14.8 — ABNORMAL HIGH

## 2011-04-23 LAB — URINE MICROSCOPIC-ADD ON

## 2011-04-23 LAB — CROSSMATCH
ABO/RH(D): B POS
Antibody Screen: NEGATIVE

## 2011-04-23 LAB — INFLUENZA A+B VIRUS AG-DIRECT(RAPID)
Inflenza A Ag: NEGATIVE
Influenza B Ag: NEGATIVE

## 2011-04-23 LAB — PROTIME-INR
INR: 1.1
INR: 1.8 — ABNORMAL HIGH
INR: 2.1 — ABNORMAL HIGH
INR: 2.3 — ABNORMAL HIGH
Prothrombin Time: 14.3
Prothrombin Time: 19.8 — ABNORMAL HIGH
Prothrombin Time: 21.6 — ABNORMAL HIGH
Prothrombin Time: 24.5 — ABNORMAL HIGH

## 2011-04-23 LAB — CULTURE, BLOOD (ROUTINE X 2): Culture: NO GROWTH

## 2011-04-23 LAB — APTT: aPTT: 25

## 2011-04-23 LAB — URINE CULTURE
Colony Count: NO GROWTH
Special Requests: NEGATIVE

## 2011-04-23 LAB — DIFFERENTIAL
Lymphocytes Relative: 17
Lymphs Abs: 2.5
Neutrophils Relative %: 74

## 2011-08-04 DIAGNOSIS — E669 Obesity, unspecified: Secondary | ICD-10-CM | POA: Insufficient documentation

## 2011-10-28 ENCOUNTER — Other Ambulatory Visit: Payer: Self-pay | Admitting: Gynecology

## 2011-10-28 DIAGNOSIS — Z1231 Encounter for screening mammogram for malignant neoplasm of breast: Secondary | ICD-10-CM

## 2011-11-02 ENCOUNTER — Ambulatory Visit: Payer: Self-pay

## 2012-01-24 ENCOUNTER — Emergency Department (HOSPITAL_COMMUNITY)
Admission: EM | Admit: 2012-01-24 | Discharge: 2012-01-24 | Disposition: A | Discharge status: Home / Self Care | Payer: Medicare Other | Source: Home / Self Care | Attending: Emergency Medicine | Admitting: Emergency Medicine

## 2012-01-24 ENCOUNTER — Encounter (HOSPITAL_COMMUNITY): Payer: Self-pay

## 2012-01-24 ENCOUNTER — Emergency Department (INDEPENDENT_AMBULATORY_CARE_PROVIDER_SITE_OTHER): Payer: Medicare Other

## 2012-01-24 DIAGNOSIS — M25519 Pain in unspecified shoulder: Secondary | ICD-10-CM

## 2012-01-24 HISTORY — DX: Essential (primary) hypertension: I10

## 2012-01-24 MED ORDER — MELOXICAM 7.5 MG PO TABS: 7.5000 mg | ORAL_TABLET | Freq: Every day | ORAL | Status: AC

## 2012-01-24 NOTE — ED Provider Notes (Signed)
History     CSN: [id number]  Arrival date & time 01/24/12  1112   First MD Initiated Contact with Patient 01/24/12 1117      Chief Complaint  Patient presents with  . Shoulder Pain    (Consider location/radiation/quality/duration/timing/severity/associated sxs/prior treatment) HPI Comments: Patient presents this morning to urgent care complaining of ongoing left shoulder pain (points to left acromioclavicular joint). Patient describes that she was stepping out of her physical she tripped and fell and a hard surface, since then her shoulder has been bothering her. She has been taking some pain medicines that she had leftovers from a previous hip replacement she can't recall the name that has been helping her. She decided to come in as it's been almost 2 weeks but the pain has not gone away completely she wanted to make sure she that she did not sustain any type of fracture. Says the pain is somewhat better and she's able to do most movements but it does hurt. Patient denies any other symptomatology such as numbness, tingling sensation or distal weakness.  Patient also denies any constitutional symptoms such as fevers, unintentional weight loss, body aches or arthralgias.  Patient is a 66 y.o. female presenting with shoulder pain. The history is provided by the patient.  Shoulder Pain This is a new problem. The current episode started more than 1 week ago. The problem occurs constantly. The problem has been gradually worsening. Pertinent negatives include no chest pain, no abdominal pain, no headaches and no shortness of breath. Exacerbated by: movement. Nothing relieves the symptoms. She has tried nothing for the symptoms.    Past Medical History  Diagnosis Date  . Diabetes mellitus   . Hypertension     Past Surgical History  Procedure Date  . Abdominal hysterectomy   . Total hip arthroplasty     No family history on file.  History  Substance Use Topics  . Smoking status: Never  Smoker   . Smokeless tobacco: Not on file  . Alcohol Use: No    OB History    Grav Para Term Preterm Abortions TAB SAB Ect Mult Living                  Review of Systems  Constitutional: Negative for fever, chills, activity change, appetite change and fatigue.  HENT: Negative for congestion and neck pain.   Respiratory: Negative for shortness of breath.   Cardiovascular: Negative for chest pain, palpitations and leg swelling.  Gastrointestinal: Negative for abdominal pain.  Genitourinary: Negative for flank pain.  Skin: Negative for color change and rash.  Neurological: Negative for dizziness, seizures, facial asymmetry, weakness, light-headedness, numbness and headaches.    Allergies  Review of patient's allergies indicates no known allergies.  Home Medications   Current Outpatient Rx  Name Route Sig Dispense Refill  . AMLODIPINE BESYLATE PO Oral Take by mouth.    . ATENOLOL PO Oral Take by mouth.    . INSULIN ISOPHANE HUMAN 100 UNIT/ML [place] SUSP Subcutaneous Inject 31 Units into the skin daily before breakfast.    . INSULIN ISOPHANE HUMAN 100 UNIT/ML [place] SUSP Subcutaneous Inject 11 Units into the skin at bedtime.    Marland Kitchen KLOR-CON PO Oral Take by mouth.      BP 148/87  Pulse 73  Temp 98.5 F (36.9 C) (Oral)  Resp 16  SpO2 100%  Physical Exam  Nursing note and vitals reviewed. Constitutional: She is oriented to person, place, and time. She appears well-developed and well-nourished.  HENT:  Head: Normocephalic.  Eyes: Conjunctivae are normal. Right eye exhibits no discharge.  Neck: Neck supple. No JVD present.  Abdominal: Soft.  Musculoskeletal: She exhibits tenderness. She exhibits no edema.       Left shoulder: She exhibits tenderness, bony tenderness and pain. She exhibits normal range of motion, no swelling, no effusion, no crepitus, no laceration, no spasm, normal pulse and normal strength.       Arms: Lymphadenopathy:    She has no cervical adenopathy.    Neurological: She is alert and oriented to person, place, and time. She displays normal reflexes. No cranial nerve deficit. She exhibits normal muscle tone. Coordination normal.    ED Course  Procedures (including critical care time)  Labs Reviewed - No data to display No results found.   No diagnosis found.    MDM  Acromioclavicular sprain strain. We are performing x-rays to rule out subluxations or other associated abnormalities.        Jimmie Molly, MD 01/24/12 1149

## 2012-01-24 NOTE — Discharge Instructions (Signed)
If this discomfort persists beyond 2 weeks despite this medicine doctor Dr. as you might need some guided physical therapy or you could also called his orthopedic provider.   Acromioclavicular Injuries The AC (acromioclavicular) joint is the joint in the shoulder where the collarbone (clavicle) meets the shoulder blade (scapula). The part of the shoulder blade connected to the collarbone is called the acromion. Common problems with and treatments for the [facility] joint are detailed below. ARTHRITIS Arthritis occurs when the joint has been injured and the smooth padding between the joints (cartilage) is lost. This is the wear and tear seen in most joints of the body if they have been overused. This causes the joint to produce pain and swelling which is worse with activity.  AC JOINT SEPARATION AC joint separation means that the ligaments connecting the acromion of the shoulder blade and collarbone have been damaged, and the two bones no longer line up. AC separations can be anywhere from mild to severe, and are "graded" depending upon which ligaments are torn and how badly they are torn.  Grade I Injury: the least damage is done, and the [facility] joint still lines up.   Grade II Injury: damage to the ligaments which reinforce the [facility] joint. In a Grade II injury, these ligaments are stretched but not entirely torn. When stressed, the [facility] joint becomes painful and unstable.   Grade III Injury: AC and secondary ligaments are completely torn, and the collarbone is no longer attached to the shoulder blade. This results in deformity; a prominence of the end of the clavicle.  AC JOINT FRACTURE AC joint fracture means that there has been a break in the bones of the [facility] joint, usually the end of the clavicle. TREATMENT TREATMENT OF AC ARTHRITIS  There is currently no way to replace the cartilage damaged by arthritis. The best way to improve the condition is to decrease the activities which aggravate the problem.  Application of ice to the joint helps decrease pain and soreness (inflammation). The use of non-steroidal anti-inflammatory medication is helpful.   If less conservative measures do not work, then cortisone shots (injections) may be used. These are anti-inflammatories; they decrease the soreness in the joint and swelling.   If non-surgical measures fail, surgery may be recommended. The procedure is generally removal of a portion of the end of the clavicle. This is the part of the collarbone closest to your acromion which is stabilized with ligaments to the acromion of the shoulder blade. This surgery may be performed using a tube-like instrument with a light (arthroscope) for looking into a joint. It may also be performed as an open surgery through a small incision by the surgeon. Most patients will have good range of motion within 6 weeks and may return to all activity including sports by 8-12 weeks, barring complications.  TREATMENT OF AN AC SEPARATION  The initial treatment is to decrease pain. This is best accomplished by immobilizing the arm in a sling and placing an ice pack to the shoulder for 20 to 30 minutes every 2 hours as needed. As the pain starts to subside, it is important to begin moving the fingers, wrist, elbow and eventually the shoulder in order to prevent a stiff or "frozen" shoulder. Instruction on when and how much to move the shoulder will be provided by your caregiver. The length of time needed to regain full motion and function depends on the amount or grade of the injury. Recovery from a Grade I AC separation  usually takes 10 to 14 days, whereas a Grade III may take 6 to 8 weeks.   Grade I and II separations usually do not require surgery. Even Grade III injuries usually allow return to full activity with few restrictions. Treatment is also based on the activity demands of the injured shoulder. For example, a high level quarterback with an injured throwing arm will receive more  aggressive treatment than someone with a desk job who rarely uses his/her arm for strenuous activities. In some cases, a painful lump may persist which could require a later surgery. Surgery can be very successful, but the benefits must be weighed against the potential risks.  TREATMENT OF AN AC JOINT FRACTURE Fracture treatment depends on the type of fracture. Sometimes a splint or sling may be all that is required. Other times surgery may be required for repair. This is more frequently the case when the ligaments supporting the clavicle are completely torn. Your caregiver will help you with these decisions and together you can decide what will be the best treatment. HOME CARE INSTRUCTIONS   Apply ice to the injury for 15 to 20 minutes each hour while awake for 2 days. Put the ice in a plastic bag and place a towel between the bag of ice and skin.   If a sling has been applied, wear it constantly for as long as directed by your caregiver, even at night. The sling or splint can be removed for bathing or showering or as directed. Be sure to keep the shoulder in the same place as when the sling is on. Do not lift the arm.   If a figure-of-eight splint has been applied it should be tightened gently by another person every day. Tighten it enough to keep the shoulders held back. Allow enough room to place the index finger between the body and strap. Loosen the splint immediately if there is numbness or tingling in the hands.   Take over-the-counter or prescription medicines for pain, discomfort or fever as directed by your caregiver.   If you or your child has received a follow up appointment, it is very important to keep that appointment in order to avoid long term complications, chronic pain or disability.  SEEK MEDICAL CARE IF:   The pain is not relieved with medications.   There is increased swelling or discoloration that continues to get worse rather than better.   You or your child has been  unable to follow up as instructed.   There is progressive numbness and tingling in the arm, forearm or hand.  SEEK IMMEDIATE MEDICAL CARE IF:   The arm is numb, cold or pale.   There is increasing pain in the hand, forearm or fingers.  MAKE SURE YOU:   Understand these instructions.   Will watch your condition.   Will get help right away if you are not doing well or get worse.  Document Released: 04/28/2005 Document Revised: 07/08/2011 Document Reviewed: 10/21/2008 [facility] Patient Information 2012 [place], [place].Acromioclavicular Injuries The acromioclavicular [facility]) joint is the joint in the shoulder. There are many bands of tissue (ligaments) that surround the [facility] bones and joints. These bands of tissue can tear, which can lead to sprains and separations. The bones of the [facility] joint can also break (fracture).  HOME CARE   Put ice on the injured area.   Put ice in a plastic bag.   Place a towel between your skin and the bag.   Leave the ice on for 15  to 20 minutes, 3 to 4 times a day.   Wear your sling as told by your doctor. Remove the sling before showering and bathing. Keep the shoulder in the same place as when the sling is on. Do not lift the arm.   Gently tighten your figure-eight splint (if applied) every day. Tighten it enough to keep the shoulders held back. There should be room to place your finger between your body and the strap. Loosen the splint right away if you lose feeling (numbness) or have tingling in your hands.   Only take medicine as told by your doctor.   Keep all follow-up visits with your doctor.  GET HELP RIGHT AWAY IF:   Your medicine does not help your pain.   You have more puffiness (swelling) or your bruising gets worse rather than better.   You were unable to follow up as told by your doctor.   You have tingling or lose even more feeling in your arm, forearm, or hand.   Your arm is cold or pale.   You have more pain in the hand, forearm, or  fingers.  MAKE SURE YOU:   Understand these instructions.   Will watch your condition.   Will get help right away if you are not doing well or get worse.  Document Released: 01/06/2010 Document Revised: 07/08/2011 Document Reviewed: 01/06/2010 [facility] Patient Information 2012 [place], [place].

## 2012-01-24 NOTE — ED Notes (Signed)
Pt states she fell 2 weeks ago and landed on lt shoulder.  Continues to have pain in lt shoulder with movement.

## 2012-07-08 ENCOUNTER — Emergency Department (HOSPITAL_COMMUNITY): Payer: Medicare Other

## 2012-07-08 ENCOUNTER — Encounter (HOSPITAL_COMMUNITY): Payer: Self-pay | Admitting: Emergency Medicine

## 2012-07-08 ENCOUNTER — Emergency Department (HOSPITAL_COMMUNITY)
Admission: EM | Admit: 2012-07-08 | Discharge: 2012-07-08 | Disposition: A | Discharge status: Home / Self Care | Payer: Medicare Other | Attending: Emergency Medicine | Admitting: Emergency Medicine

## 2012-07-08 DIAGNOSIS — R05 Cough: Secondary | ICD-10-CM | POA: Insufficient documentation

## 2012-07-08 DIAGNOSIS — R112 Nausea with vomiting, unspecified: Secondary | ICD-10-CM | POA: Insufficient documentation

## 2012-07-08 DIAGNOSIS — R059 Cough, unspecified: Secondary | ICD-10-CM | POA: Insufficient documentation

## 2012-07-08 DIAGNOSIS — R509 Fever, unspecified: Secondary | ICD-10-CM | POA: Insufficient documentation

## 2012-07-08 DIAGNOSIS — E119 Type 2 diabetes mellitus without complications: Secondary | ICD-10-CM | POA: Insufficient documentation

## 2012-07-08 DIAGNOSIS — I10 Essential (primary) hypertension: Secondary | ICD-10-CM | POA: Insufficient documentation

## 2012-07-08 DIAGNOSIS — B9789 Other viral agents as the cause of diseases classified elsewhere: Secondary | ICD-10-CM | POA: Insufficient documentation

## 2012-07-08 DIAGNOSIS — Z794 Long term (current) use of insulin: Secondary | ICD-10-CM | POA: Insufficient documentation

## 2012-07-08 DIAGNOSIS — B349 Viral infection, unspecified: Secondary | ICD-10-CM

## 2012-07-08 DIAGNOSIS — Z79899 Other long term (current) drug therapy: Secondary | ICD-10-CM | POA: Insufficient documentation

## 2012-07-08 LAB — CBC WITH DIFFERENTIAL/PLATELET
Eosinophils Absolute: 0 10*3/uL (ref 0.0–0.7)
Hemoglobin: 11.8 g/dL — ABNORMAL LOW (ref 12.0–15.0)
Lymphocytes Relative: 11 % — ABNORMAL LOW (ref 12–46)
Lymphs Abs: 0.9 10*3/uL (ref 0.7–4.0)
MCH: 27.8 pg (ref 26.0–34.0)
MCV: 83.1 fL (ref 78.0–100.0)
Monocytes Relative: 8 % (ref 3–12)
Neutrophils Relative %: 80 % — ABNORMAL HIGH (ref 43–77)
Platelets: 141 10*3/uL — ABNORMAL LOW (ref 150–400)
RBC: 4.25 MIL/uL (ref 3.87–5.11)
WBC: 7.7 10*3/uL (ref 4.0–10.5)

## 2012-07-08 LAB — BASIC METABOLIC PANEL
BUN: 15 mg/dL (ref 6–23)
CO2: 24 mEq/L (ref 19–32)
Chloride: 100 mEq/L (ref 96–112)
GFR calc non Af Amer: 60 mL/min — ABNORMAL LOW (ref >90)
Glucose, Bld: 214 mg/dL — ABNORMAL HIGH (ref 70–99)
Potassium: 3.2 mEq/L — ABNORMAL LOW (ref 3.5–5.1)
Sodium: 136 mEq/L (ref 135–145)

## 2012-07-08 LAB — URINALYSIS, ROUTINE W REFLEX MICROSCOPIC
Bilirubin Urine: NEGATIVE
Glucose, UA: 100 mg/dL — AB
Hgb urine dipstick: NEGATIVE
Specific Gravity, Urine: 1.029 (ref 1.005–1.030)
Urobilinogen, UA: 1 mg/dL (ref 0.0–1.0)
pH: 5.5 (ref 5.0–8.0)

## 2012-07-08 LAB — URINE MICROSCOPIC-ADD ON

## 2012-07-08 MED ORDER — OSELTAMIVIR PHOSPHATE 75 MG PO CAPS: 75.0000 mg | ORAL_CAPSULE | Freq: Two times a day (BID) | ORAL | Status: DC

## 2012-07-08 MED ORDER — MORPHINE SULFATE 4 MG/ML IJ SOLN: 6.0000 mg | Freq: Once | INTRAMUSCULAR | Status: DC

## 2012-07-08 MED ORDER — OSELTAMIVIR PHOSPHATE 75 MG PO CAPS
75.0000 mg | ORAL_CAPSULE | Freq: Once | ORAL | Status: AC
  Administered 2012-07-08: 75 mg via ORAL
  Filled 2012-07-08: qty 1

## 2012-07-08 MED ORDER — OSELTAMIVIR PHOSPHATE 75 MG PO CAPS: 75.0000 mg | ORAL_CAPSULE | Freq: Once | ORAL | Status: DC

## 2012-07-08 MED ORDER — ONDANSETRON HCL 4 MG PO TABS: 4.0000 mg | ORAL_TABLET | Freq: Three times a day (TID) | ORAL | Status: DC | PRN

## 2012-07-08 MED ORDER — ONDANSETRON HCL 4 MG/2ML IJ SOLN: 4.0000 mg | Freq: Once | INTRAMUSCULAR | Status: DC

## 2012-07-08 MED ORDER — POTASSIUM CHLORIDE 20 MEQ/15ML (10%) PO LIQD
40.0000 meq | Freq: Once | ORAL | Status: AC
  Administered 2012-07-08: 40 meq via ORAL
  Filled 2012-07-08: qty 30

## 2012-07-08 MED ORDER — ACETAMINOPHEN 325 MG PO TABS
325.0000 mg | ORAL_TABLET | Freq: Once | ORAL | Status: AC
  Administered 2012-07-08: 325 mg via ORAL
  Filled 2012-07-08: qty 1

## 2012-07-08 MED ORDER — ONDANSETRON 4 MG PO TBDP
4.0000 mg | ORAL_TABLET | Freq: Once | ORAL | Status: AC
  Administered 2012-07-08: 4 mg via ORAL
  Filled 2012-07-08: qty 1

## 2012-07-08 MED ORDER — HYDROCOD POLST-CHLORPHEN POLST 10-8 MG/5ML PO LQCR: 5.0000 mL | Freq: Two times a day (BID) | ORAL | Status: DC | PRN

## 2012-07-08 MED ORDER — OXYCODONE-ACETAMINOPHEN 5-325 MG PO TABS: ORAL_TABLET | ORAL | Status: DC

## 2012-07-08 MED ORDER — SODIUM CHLORIDE 0.9 % IV BOLUS (SEPSIS): 1000.0000 mL | Freq: Once | INTRAVENOUS | Status: DC

## 2012-07-08 NOTE — ED Provider Notes (Signed)
History     CSN: [id number]  Arrival date & time 07/08/12  1039   First MD Initiated Contact with Patient 07/08/12 1132      Chief Complaint  Patient presents with  . Generalized Body Aches    (Consider location/radiation/quality/duration/timing/severity/associated sxs/prior treatment) HPI  Catherine Ramsey is a 66 y.o. female complaining of dry cough, subjective fever, nausea, pleuritic chest pain, abdominal pain that is exacerbated by coughing, SOB, myalgia, HA (6/10 generalized). Denies change in bowel or bladder habits, sick contacts. Has not had a flu shot this year.   Past Medical History  Diagnosis Date  . Diabetes mellitus   . Hypertension     Past Surgical History  Procedure Date  . Abdominal hysterectomy   . Total hip arthroplasty     No family history on file.  History  Substance Use Topics  . Smoking status: Never Smoker   . Smokeless tobacco: Not on file  . Alcohol Use: No    OB History    Grav Para Term Preterm Abortions TAB SAB Ect Mult Living                  Review of Systems  Constitutional: Positive for fever.  Respiratory: Positive for cough and shortness of breath.   Cardiovascular: Negative for chest pain.  Gastrointestinal: Positive for nausea and vomiting. Negative for abdominal pain and diarrhea.  Musculoskeletal: Positive for myalgias.  Neurological: Positive for headaches.  All other systems reviewed and are negative.    Allergies  Review of patient's allergies indicates no known allergies.  Home Medications   Current Outpatient Rx  Name  Route  Sig  Dispense  Refill  . AMLODIPINE BESYLATE PO   Oral   Take by mouth.         . ATENOLOL PO   Oral   Take by mouth.         . INSULIN ISOPHANE HUMAN 100 UNIT/ML [place] SUSP   Subcutaneous   Inject 31 Units into the skin daily before breakfast.         . INSULIN ISOPHANE HUMAN 100 UNIT/ML [place] SUSP   Subcutaneous   Inject 11 Units into the skin at bedtime.         Marland Kitchen  KLOR-CON PO   Oral   Take by mouth.         . TRAMADOL HCL 50 MG PO TABS   Oral   Take 50 mg by mouth every 6 (six) hours as needed. For pain           BP 157/73  Pulse 70  Temp 100.7 F (38.2 C) (Oral)  Resp 23  SpO2 97%  Physical Exam  Nursing note and vitals reviewed. Constitutional: She is oriented to person, place, and time. She appears well-developed and well-nourished. No distress.       Appears acutely ill  HENT:  Head: Normocephalic.  Mouth/Throat: Oropharynx is clear and moist.  Eyes: Conjunctivae normal and EOM are normal. Pupils are equal, round, and reactive to light.  Cardiovascular: Normal rate, regular rhythm and intact distal pulses.   Pulmonary/Chest: Effort normal and breath sounds normal. No stridor. No respiratory distress. She has no wheezes. She has no rales. She exhibits no tenderness.       Lung sounds are clear, no respiratory distress.  Abdominal: Soft. Bowel sounds are normal. She exhibits no distension and no mass. There is no tenderness. There is no rebound and no guarding.  Musculoskeletal: Normal range  of motion.  Neurological: She is alert and oriented to person, place, and time.  Psychiatric: She has a normal mood and affect.    ED Course  Procedures (including critical care time)  Labs Reviewed  CBC WITH DIFFERENTIAL - Abnormal; Notable for the following:    Hemoglobin 11.8 (*)     HCT 35.3 (*)     Platelets 141 (*)     Neutrophils Relative 80 (*)     Lymphocytes Relative 11 (*)     All other components within normal limits  BASIC METABOLIC PANEL - Abnormal; Notable for the following:    Potassium 3.2 (*)     Glucose, Bld 214 (*)     GFR calc non Af Amer 60 (*)     GFR calc Af Amer 69 (*)     All other components within normal limits  URINALYSIS, ROUTINE W REFLEX MICROSCOPIC   Dg Chest 2 View  07/08/2012  *RADIOLOGY REPORT*  Clinical Data: Generalized body ache, nausea  CHEST - 2 VIEW  Comparison: 09/23/2007  Findings:  Cardiomediastinal silhouette is stable.  No acute infiltrate or pleural effusion.  No pulmonary edema.  Mild degenerative changes thoracic spine.  IMPRESSION: .  No active disease. No significant change.   Original Report Authenticated By: Natasha Mead, M.D.      1. Viral syndrome       MDM  Likely influenza, although patient is outside the window for Tamiflu she has a comorbidity of diabetes I think it is reasonable to start her on the medication in the ED. No leukocytosis, tachycardia or signs of sepsis. CXR shows no infiltrate.     Filed Vitals:   07/08/12 1137  BP: 157/73  Pulse: 70  Temp: 100.7 F (38.2 C)  Resp: 23    Pt verbalized understanding and agrees with care plan. Outpatient follow-up and return precautions given.    New Prescriptions   CHLORPHENIRAMINE-HYDROCODONE (TUSSIONEX PENNKINETIC ER) 10-8 MG/5ML LQCR    Take 5 mLs by mouth every 12 (twelve) hours as needed (Cough).   ONDANSETRON (ZOFRAN) 4 MG TABLET    Take 1 tablet (4 mg total) by mouth every 8 (eight) hours as needed for nausea.   OSELTAMIVIR (TAMIFLU) 75 MG CAPSULE    Take 1 capsule (75 mg total) by mouth every 12 (twelve) hours.   OXYCODONE-ACETAMINOPHEN (PERCOCET/ROXICET) 5-325 MG PER TABLET    1 to 2 tabs PO q6hrs  PRN for pain     Wynetta Emery, PA-C 07/08/12 1513

## 2012-07-08 NOTE — ED Notes (Signed)
Multiple IV start attempts unsuccessful.   IV team to start IV.

## 2012-07-08 NOTE — ED Notes (Signed)
Pt. Stated, my head hurst I'm coughing I just feel bad all over started yesterday.

## 2012-07-11 NOTE — ED Provider Notes (Signed)
Medical screening examination/treatment/procedure(s) were performed by non-physician practitioner and as supervising physician I was immediately available for consultation/collaboration.   Gavin Pound. Oletta Lamas, MD 07/11/12 [phone]

## 2012-09-11 DIAGNOSIS — M6780 Other specified disorders of synovium and tendon, unspecified site: Secondary | ICD-10-CM | POA: Insufficient documentation

## 2012-10-04 ENCOUNTER — Other Ambulatory Visit: Payer: Self-pay | Admitting: Internal Medicine

## 2012-10-04 DIAGNOSIS — R109 Unspecified abdominal pain: Secondary | ICD-10-CM

## 2012-10-05 ENCOUNTER — Other Ambulatory Visit: Payer: Self-pay | Admitting: Internal Medicine

## 2012-10-05 ENCOUNTER — Ambulatory Visit
Admission: RE | Admit: 2012-10-05 | Discharge: 2012-10-05 | Disposition: A | Discharge status: Home / Self Care | Payer: Medicare Other | Source: Ambulatory Visit | Attending: Internal Medicine | Admitting: Internal Medicine

## 2013-07-02 ENCOUNTER — Encounter: Payer: Self-pay | Admitting: Internal Medicine

## 2013-11-07 ENCOUNTER — Encounter (HOSPITAL_COMMUNITY): Payer: Self-pay | Admitting: Emergency Medicine

## 2013-11-07 ENCOUNTER — Emergency Department (HOSPITAL_COMMUNITY): Payer: Medicare HMO

## 2013-11-07 ENCOUNTER — Emergency Department (HOSPITAL_COMMUNITY)
Admission: EM | Admit: 2013-11-07 | Discharge: 2013-11-07 | Disposition: A | Discharge status: Home / Self Care | Payer: Medicare HMO | Attending: Emergency Medicine | Admitting: Emergency Medicine

## 2013-11-07 DIAGNOSIS — M25559 Pain in unspecified hip: Secondary | ICD-10-CM | POA: Insufficient documentation

## 2013-11-07 DIAGNOSIS — Z79899 Other long term (current) drug therapy: Secondary | ICD-10-CM | POA: Insufficient documentation

## 2013-11-07 DIAGNOSIS — Z794 Long term (current) use of insulin: Secondary | ICD-10-CM | POA: Insufficient documentation

## 2013-11-07 DIAGNOSIS — Z9889 Other specified postprocedural states: Secondary | ICD-10-CM | POA: Insufficient documentation

## 2013-11-07 DIAGNOSIS — R1012 Left upper quadrant pain: Secondary | ICD-10-CM | POA: Insufficient documentation

## 2013-11-07 DIAGNOSIS — M533 Sacrococcygeal disorders, not elsewhere classified: Secondary | ICD-10-CM | POA: Insufficient documentation

## 2013-11-07 DIAGNOSIS — R6883 Chills (without fever): Secondary | ICD-10-CM | POA: Insufficient documentation

## 2013-11-07 DIAGNOSIS — R1032 Left lower quadrant pain: Secondary | ICD-10-CM | POA: Insufficient documentation

## 2013-11-07 DIAGNOSIS — M461 Sacroiliitis, not elsewhere classified: Secondary | ICD-10-CM

## 2013-11-07 DIAGNOSIS — M25552 Pain in left hip: Secondary | ICD-10-CM

## 2013-11-07 DIAGNOSIS — I10 Essential (primary) hypertension: Secondary | ICD-10-CM | POA: Insufficient documentation

## 2013-11-07 DIAGNOSIS — E119 Type 2 diabetes mellitus without complications: Secondary | ICD-10-CM | POA: Insufficient documentation

## 2013-11-07 LAB — CBC WITH DIFFERENTIAL/PLATELET
BASOS PCT: 0 % (ref 0–1)
Basophils Absolute: 0 10*3/uL (ref 0.0–0.1)
EOS ABS: 0.2 10*3/uL (ref 0.0–0.7)
Eosinophils Relative: 3 % (ref 0–5)
HCT: 35.2 % — ABNORMAL LOW (ref 36.0–46.0)
Hemoglobin: 11.6 g/dL — ABNORMAL LOW (ref 12.0–15.0)
Lymphocytes Relative: 31 % (ref 12–46)
Lymphs Abs: 2.5 10*3/uL (ref 0.7–4.0)
MCH: 28 pg (ref 26.0–34.0)
MCHC: 33 g/dL (ref 30.0–36.0)
MCV: 84.8 fL (ref 78.0–100.0)
MONOS PCT: 8 % (ref 3–12)
Monocytes Absolute: 0.6 10*3/uL (ref 0.1–1.0)
NEUTROS PCT: 58 % (ref 43–77)
Neutro Abs: 4.6 10*3/uL (ref 1.7–7.7)
PLATELETS: 164 10*3/uL (ref 150–400)
RBC: 4.15 MIL/uL (ref 3.87–5.11)
RDW: 13.5 % (ref 11.5–15.5)
WBC: 7.9 10*3/uL (ref 4.0–10.5)

## 2013-11-07 LAB — URINALYSIS, ROUTINE W REFLEX MICROSCOPIC
BILIRUBIN URINE: NEGATIVE
Glucose, UA: NEGATIVE mg/dL
Hgb urine dipstick: NEGATIVE
Ketones, ur: NEGATIVE mg/dL
NITRITE: NEGATIVE
PROTEIN: 30 mg/dL — AB
SPECIFIC GRAVITY, URINE: 1.028 (ref 1.005–1.030)
UROBILINOGEN UA: 0.2 mg/dL (ref 0.0–1.0)
pH: 6 (ref 5.0–8.0)

## 2013-11-07 LAB — URINE MICROSCOPIC-ADD ON

## 2013-11-07 LAB — BASIC METABOLIC PANEL
BUN: 15 mg/dL (ref 6–23)
CALCIUM: 9 mg/dL (ref 8.4–10.5)
CO2: 26 mEq/L (ref 19–32)
Chloride: 107 mEq/L (ref 96–112)
Creatinine, Ser: 1.03 mg/dL (ref 0.50–1.10)
GFR, EST AFRICAN AMERICAN: 64 mL/min — AB (ref >90)
GFR, EST NON AFRICAN AMERICAN: 55 mL/min — AB (ref >90)
Glucose, Bld: 119 mg/dL — ABNORMAL HIGH (ref 70–99)
POTASSIUM: 4 meq/L (ref 3.7–5.3)
SODIUM: 145 meq/L (ref 137–147)

## 2013-11-07 MED ORDER — SODIUM CHLORIDE 0.9 % IV SOLN
INTRAVENOUS | Status: DC
  Administered 2013-11-07: 11:00:00 via INTRAVENOUS

## 2013-11-07 MED ORDER — ONDANSETRON HCL 4 MG/2ML IJ SOLN: 4.0000 mg | Freq: Once | INTRAMUSCULAR | Status: DC

## 2013-11-07 MED ORDER — IOHEXOL 300 MG/ML  SOLN
25.0000 mL | Freq: Once | INTRAMUSCULAR | Status: AC | PRN
  Administered 2013-11-07: 25 mL via ORAL

## 2013-11-07 MED ORDER — PREDNISONE 20 MG PO TABS: 40.0000 mg | ORAL_TABLET | Freq: Every day | ORAL | Status: DC

## 2013-11-07 MED ORDER — OXYCODONE-ACETAMINOPHEN 5-325 MG PO TABS: 1.0000 | ORAL_TABLET | ORAL | Status: DC | PRN

## 2013-11-07 MED ORDER — IOHEXOL 300 MG/ML  SOLN
80.0000 mL | Freq: Once | INTRAMUSCULAR | Status: AC | PRN
  Administered 2013-11-07: 80 mL via INTRAVENOUS

## 2013-11-07 NOTE — ED Provider Notes (Signed)
CSN: [id number]     Arrival date & time 11/07/13  0945 History   First MD Initiated Contact with Patient 11/07/13 1006    This chart was scribed for Earney Navy, a non-physician practitioner working with No att. providers found by Denice Bors, ED Scribe. This patient was seen in room A09C/A09C and the patient's care was started at 6:35 PM      Chief Complaint  Patient presents with  . Hip Pain     (Consider location/radiation/quality/duration/timing/severity/associated sxs/prior Treatment) The history is provided by the patient. No language interpreter was used.   HPI Comments: Catherine Ramsey is a 68 y.o. female who presents to the Emergency Department complaining of constant left hip pain onset 5 days. Denies any precipitating factors. Describes pain as shooting, worsening in severity, and radiating to anterior right upper thigh. Reports pain is exacerbated by weight bearing. Denies any alleviating factors. Denies associated recent injury/trauma, fever, emesis, weakness, diarrhea, change in bowel movements, nausea, abdominal pain, constipation, and numbness.Reports PMHx of total right hip replacement.  Past Medical History  Diagnosis Date  . Diabetes mellitus   . Hypertension    Past Surgical History  Procedure Laterality Date  . Abdominal hysterectomy    . Total hip arthroplasty     No family history on file. History  Substance Use Topics  . Smoking status: Never Smoker   . Smokeless tobacco: Not on file  . Alcohol Use: No   OB History   Grav Para Term Preterm Abortions TAB SAB Ect Mult Living                 Review of Systems  Constitutional: Positive for chills. Negative for fever.  Gastrointestinal: Positive for abdominal pain. Negative for nausea, vomiting, diarrhea and constipation.  Musculoskeletal: Positive for arthralgias.  Psychiatric/Behavioral: Negative for confusion.      Allergies  Review of patient's allergies indicates no known  allergies.  Home Medications   Current Outpatient Rx  Name  Route  Sig  Dispense  Refill  . amLODipine (NORVASC) 10 MG tablet   Oral   Take 10 mg by mouth daily.         Marland Kitchen atenolol (TENORMIN) 50 MG tablet   Oral   Take 50 mg by mouth 2 (two) times daily.         . insulin NPH (HUMULIN N,NOVOLIN N) 100 UNIT/ML injection   Subcutaneous   Inject 31 Units into the skin daily before breakfast.         . potassium chloride (KLOR-CON) 8 MEQ tablet   Oral   Take 8 mEq by mouth 2 (two) times daily.         . traMADol (ULTRAM) 50 MG tablet   Oral   Take 50 mg by mouth every 6 (six) hours as needed. For pain         . insulin NPH (HUMULIN N,NOVOLIN N) 100 UNIT/ML injection   Subcutaneous   Inject 11 Units into the skin at bedtime.         Marland Kitchen oxyCODONE-acetaminophen (PERCOCET/ROXICET) 5-325 MG per tablet   Oral   Take 1 tablet by mouth every 4 (four) hours as needed.   15 tablet   0   . predniSONE (DELTASONE) 20 MG tablet   Oral   Take 2 tablets (40 mg total) by mouth daily. Take 40 mg by mouth daily for 3 days, then 20mg  by mouth daily for 3 days, then 10mg  daily for 3 days  12 tablet   0    BP 176/76  Pulse 58  Temp(Src) 97.7 F (36.5 C) (Oral)  Resp 16  Ht 5\' 7"  (1.702 m)  Wt 210 lb (95.255 kg)  BMI 32.88 kg/m2  SpO2 98% Physical Exam  Nursing note and vitals reviewed. Constitutional: She is oriented to person, place, and time. She appears well-developed and well-nourished. No distress.  HENT:  Head: Normocephalic and atraumatic.  Eyes: EOM are normal.  Neck: Neck supple. No tracheal deviation present.  Cardiovascular: Normal rate and regular rhythm.   Pulmonary/Chest: Effort normal and breath sounds normal. No respiratory distress.  Abdominal: Soft. She exhibits no distension. There is tenderness. There is guarding. There is no rebound.  TTP of LLQ and LUQ  Exam was limited by body habitus   Musculoskeletal: Normal range of motion.  TTP to left  lumbar paraspinal region   Antalgic gait   No midline C-spine, T-spine, or L-spine tenderness with no step-offs or deformities noted   Normal and symmetric strength to bilateral legs   Neurological: She is alert and oriented to person, place, and time.  Skin: Skin is warm and dry. No rash noted. No erythema.  No lesions, no induration noted   Psychiatric: She has a normal mood and affect. Her behavior is normal.    ED Course  Procedures (including critical care time) COORDINATION OF CARE:  Nursing notes reviewed. Vital signs reviewed. Initial pt interview and examination performed.   Filed Vitals:   11/07/13 1130 11/07/13 1230 11/07/13 1315 11/07/13 1345  BP: 172/76 172/76 170/79 176/76  Pulse:  65 65 58  Temp:      TempSrc:      Resp:      Height:      Weight:      SpO2:  99% 93% 98%    6:35 PM-Discussed work up plan with pt at bedside, which includes  Orders Placed This Encounter  Procedures  . CT Abdomen Pelvis W Contrast    Standing Status: Standing     Number of Occurrences: 1     Standing Expiration Date:     Order Specific Question:  Reason for exam:    Answer:  LLQ pain and left hip pain    Order Specific Question:  Does the patient have a contrast media/X-ray dye allergy?    Answer:  No  . CBC with Differential    Standing Status: Standing     Number of Occurrences: 1     Standing Expiration Date:   . Basic metabolic panel    Standing Status: Standing     Number of Occurrences: 1     Standing Expiration Date:   . Urinalysis, Routine w reflex microscopic    Standing Status: Standing     Number of Occurrences: 1     Standing Expiration Date:   . Urine microscopic-add on    Standing Status: Standing     Number of Occurrences: 1     Standing Expiration Date:   . Pt agrees with plan.   Treatment plan initiated: Medications  iohexol (OMNIPAQUE) 300 MG/ML solution 25 mL (25 mLs Oral Contrast Given 11/07/13 1048)  iohexol (OMNIPAQUE) 300 MG/ML solution  80 mL (80 mLs Intravenous Contrast Given 11/07/13 1158)     Initial diagnostic testing ordered.       Labs Review Labs Reviewed  CBC WITH DIFFERENTIAL - Abnormal; Notable for the following:    Hemoglobin 11.6 (*)    HCT 35.2 (*)  All other components within normal limits  BASIC METABOLIC PANEL - Abnormal; Notable for the following:    Glucose, Bld 119 (*)    GFR calc non Af Amer 55 (*)    GFR calc Af Amer 64 (*)    All other components within normal limits  URINALYSIS, ROUTINE W REFLEX MICROSCOPIC - Abnormal; Notable for the following:    APPearance CLOUDY (*)    Protein, ur 30 (*)    Leukocytes, UA MODERATE (*)    All other components within normal limits  URINE MICROSCOPIC-ADD ON - Abnormal; Notable for the following:    Squamous Epithelial / LPF MANY (*)    Bacteria, UA FEW (*)    All other components within normal limits   Imaging Review Ct Abdomen Pelvis W Contrast  11/07/2013   CLINICAL DATA:  Abdominal and pelvic pain.  EXAM: CT ABDOMEN AND PELVIS WITH CONTRAST  TECHNIQUE: Multidetector CT imaging of the abdomen and pelvis was performed using the standard protocol following bolus administration of intravenous contrast.  CONTRAST:  66mL OMNIPAQUE IOHEXOL 300 MG/ML  SOLN  COMPARISON:  None.  FINDINGS: The lung bases are clear. No pleural effusion or pulmonary nodule. There is a moderate-sized hiatal hernia. The heart is normal in size. No pericardial effusion.  The liver is unremarkable. No focal hepatic lesions or intrahepatic biliary dilatation. The gallbladder is normal. No common bile duct dilatation. The pancreas is normal. The spleen is normal. The adrenal glands and kidneys are unremarkable.  The stomach, duodenum, small bowel and colon are unremarkable. No inflammatory changes, mass lesions or obstructive findings. The appendix is normal. No mesenteric or retroperitoneal mass or adenopathy. There are atherosclerotic calcifications involving the aorta but no focal aneurysm  or dissection. The major branch vessels are patent. She major venous structures are patent.  The uterus is surgically absent. Both ovaries are still present and appear normal except for small calcifications. The bladder is unremarkable. No pelvic mass or adenopathy. No free pelvic fluid collections. No inguinal mass or adenopathy.  Examination bony structures demonstrates a right hip prosthesis. No complicating features are demonstrated. The left hip demonstrates advanced degenerative changes with joint space narrowing, osteophytic spurring and subchondral cystic change. No fracture or AVN. There are left-sided SI joint degenerative changes suggesting sacroiliitis with small erosions and mild sclerosis.  Moderate lumbar spine degenerative spondylosis.  IMPRESSION: 1. No acute abdominal/pelvic findings, mass lesions or adenopathy. 2. Moderate-sized hiatal hernia. 3. Left-sided sacroiliitis 4. Moderate to advanced left hip joint degenerative changes. Intact right hip prosthesis.   Electronically Signed   By: Kalman Jewels M.D.   On: 11/07/2013 12:26     EKG Interpretation None      MDM   Final diagnoses:  Sacroiliac inflammation  Left hip pain    Patient originally describes hip pain to triage but during examination I am concerned about pain from abdominal etiology because of her tenderness. Labs and CT scan abd/pelv ordered and patient transferred to [place] from [place] for continued care. Pt hand off to Quincy Carnes, PA-C  I personally performed the services described in this documentation, which was scribed in my presence. The recorded information has been reviewed and is accurate.   Linus Mako, PA-C 11/07/13 [phone]

## 2013-11-07 NOTE — Discharge Instructions (Signed)
Take the prescribed medication as directed.  Prednisone may cause your blood sugar to spike-- adjust insulin accordingly. Follow-up with your primary care physician.  Resource guide attached if you do not have one. Return to the ED for new or worsening symptoms--  Numbness/weakness of legs, inability to walk, loss of bowel or bladder control, etc.   Emergency Department Resource Guide 1) Find a Doctor and Pay Out of Pocket Although you won't have to find out who is covered by your insurance plan, it is a good idea to ask around and get recommendations. You will then need to call the office and see if the doctor you have chosen will accept you as a new patient and what types of options they offer for patients who are self-pay. Some doctors offer discounts or will set up payment plans for their patients who do not have insurance, but you will need to ask so you aren't surprised when you get to your appointment.  2) Contact Your Local Health Department Not all health departments have doctors that can see patients for sick visits, but many do, so it is worth a call to see if yours does. If you don't know where your local health department is, you can check in your phone book. The CDC also has a tool to help you locate your state's health department, and many state websites also have listings of all of their local health departments.  3) Find a [facility] If your illness is not likely to be very severe or complicated, you may want to try a walk in clinic. These are popping up all over the country in pharmacies, drugstores, and shopping centers. They're usually staffed by nurse practitioners or physician assistants that have been trained to treat common illnesses and complaints. They're usually fairly quick and inexpensive. However, if you have serious medical issues or chronic medical problems, these are probably not your best option.  No Primary Care Doctor: - Call Health Connect at  [phone] - they  can help you locate a primary care doctor that  accepts your insurance, provides certain services, etc. - Physician Referral Service- [phone]  Chronic Pain Problems: Organization         Address  Phone   Notes  [facility]  [phone] Patients need to be referred by their primary care doctor.   Medication Assistance: Organization         Address  Phone   Notes  [facility] Medication [facility] [address] [place], [place] [address] [phone] --Must be a resident of [facility] -- Must have NO insurance coverage whatsoever (no Medicaid/ Medicare, etc.) -- The pt. MUST have a primary care doctor that directs their care regularly and follows them in the community   MedAssist  [phone]   [employer]  [phone]    Agencies that provide inexpensive medical care: Organization         Address  Phone   Notes  [place]  [phone]   [facility]    [phone]   [facility] [place], [place] [address] [phone]   [place] [address], [place] [phone]   Planned Parenthood    [phone]   [facility]    [phone]   [place] [address], [place] Phone:  [phone], Fax:  [phone] Hours of Operation:  9 am - 6 pm, M-F.  Also accepts Medicaid/Medicare and self-pay.  [facility] for [place], [address], [place] Phone: [phone], Fax: [phone]. Hours of Operation:  8:30 am - 5:30 pm, M-F.  Also accepts Medicaid and self-pay.  [facility] [place] [address], [place] Phone: [phone]   [place], [place], [place] [phone], Ext. [phone] Mondays & Thursdays: 7-9 AM.  First 15 patients are seen on a first come, first serve basis.    [place] Providers:  Organization         Address  Phone   Notes  [facility] [address], [place] [phone] Also accepts self-pay patients.  [facility] [address], [place]  [phone]   [place], [address], [place] [phone]   [facility] [address] [place] [phone]   Lucianne Lei [address], [place]   [phone] Only accepts [place] Access [place] patients after they have their name applied to their card.   Self-Pay (no insurance) in [facility]:  Organization         Address  Phone   Notes  Sickle Cell Patients, [facility] [place] [phone]   [facility] [place] [phone]   [facility] [place]  [place], [place], [place] [phone]   [facility]/Dr. Osei-Bonsu  [address], [place] [address], [place] [phone] Phone number for both [place] and [place] locations is the same.  [facility] [address] [place] [phone]   [facility] [address] [place] or [address] [phone]   [facility] [address], [place] [phone], phone; [phone], fax Sees patients 1st and 3rd Saturday of every month.  Must not qualify for public or private insurance (i.e. Medicaid, Medicare, [place] Health Choice, Veterans' Benefits)  Household income should be no more than 200% of the poverty level The clinic cannot treat you if you are pregnant or think you are pregnant  Sexually transmitted diseases are not treated at the clinic.    Dental Care: Organization         Address  Phone  Notes  [facility] Department of [facility] [place] [phone] Accepts children up to age 51 who are enrolled in [place] or [place]; pregnant women with a Medicaid card; and children who have applied for Medicaid or [place] Health Choice, but were declined, whose parents can pay a reduced fee at time of service.  [facility] Department of [facility]  [address], [place] [phone] Accepts children up to age 57 who are enrolled in [place] or [place]; pregnant women with a Medicaid card; and children who have applied for Medicaid or [place] Health Choice, but were declined, whose parents can pay a reduced fee at time of service.  [place] Adult [facility] Access PROGRAM  [place] [phone] Patients are seen by appointment only. Walk-ins are not accepted. [place]  [facility]ental will see patients 74 years of age and older. Monday - Tuesday (8am-5pm) Most Wednesdays (8:30-5pm) $30 per visit, cash only  [facility] Adult Dental Access PROGRAM  [address], [facility] [phone] Patients are seen by appointment only. Walk-ins are not accepted. [place] will see patients 52 years of age and older. One Wednesday Evening (Monthly: Volunteer Based).  $30 per visit, cash only  [place]  [phone] for adults; Children under age 91, call Graduate Pediatric Dentistry at [phone]. Children aged 57-14, please call [phone] to request a pediatric application.  Dental services are provided in all areas of dental care including fillings, crowns and bridges, complete and partial dentures, implants, gum treatment, root canals, and extractions. Preventive care is also provided. Treatment is provided to both adults and children. Patients are selected via a lottery and there is often a waiting list.   [facility] [address] [place]  [phone] [email or web address]   Rescue  [facility] [address] [place], [place] [phone], Ext. [phone] Second and Fourth Thursday of each month, opens at 6:30 AM; Clinic ends at 9 AM.  Patients are seen on a first-come first-served basis, and a limited number are seen during each clinic.   [facility]  [address] [place] D[place]er [place], [place] [phone]   Eligibility Requirements You must have lived in [place], [place], or [place] counties for at least the last three months.   You cannot be eligible for state or federal sponsored [employer], including [employer], [place], or [employer].   You generally cannot be eligible for healthcare insurance through your employer.    How to apply: Eligibility screenings are held every Tuesday and Wednesday afternoon from 1:00 pm until 4:00 pm. You do not need an appointment for the interview!  [facility] [address] [place], [place]   [place]  [place] Department  [place]  [phone]    Behavioral Health Resources in the Community: Intensive Outpatient Programs Organization         Address  Phone  Notes  [place]. [address] [place], [place] [phone]   [facility] Outpatient [address], [place], [place]   ADS: Alcohol & Drug Svcs [address], [place], [place]   [place] [address]  [place], [place] or [phone]   Substance Abuse Resources Organization         Address  Phone  Notes  Alcohol and Drug Services  [phone]   [place]  [phone]   [facility] [place]   Chinita Pester  [phone]   Residential & Outpatient Substance Abuse Program  [phone]   Psychological Services Organization         Address  Phone  Notes  [facility] [place]   [place]  [phone]   [place] [address], [place] or [phone]    Mobile Crisis Teams Organization         Address  Phone  Notes  [facility], Mobile Crisis Care Unit  [phone]   Assertive Psychotherapeutic Services  [address] [place], [place]   [facility] [address] [place] [phone]    Self-Help/Support Groups Organization  Address  Phone             Notes  [place]. of [place] - variety of support groups  [place] Call for more information  Narcotics Anonymous (NA), [facility] [address], [employer] [place]  2 meetings at this location   Special educational needs teacher         Address  Phone  Notes  ASAP Residential Treatment [place],    [place]  [phone]   [facility]  [address], [place] [address], [place], [place]   [place], [place] [phone] Admissions: 8am-3pm M-F  Incentives Substance [place] [address],    [place], [place] [phone]   [facility] [address] [place], [place], [place]   [facility] [address]  [place], [place]   [facility] Programs - Intensive Outpatient [address], [place], [place]   [facility] ([address] [place].,  [place], [place] [phone] or [phone]   [facility] Services (RTS) [address], [place], [place] Accepts Medicaid  [facility] [place] [address]  [place] [phone] Substance Abuse/Addiction Treatment   [facility] Organization         Address  Phone  Notes  [facility]  [phone]   Domenic Schwab, PhD [address] Arlis Porta [place], [place]   [phone] or  [phone]   [place], [place] [phone]   [facility] [address] [place], [place] [phone] Insurance/Medicaid/sponsorship through [facility] Families [address], [address] [place]                                    [place], [place] [phone] [place] [address], [place] [phone]    Dr. Adele Schilder  [phone]   [facility] Dept. 1) [address], [place] 2) [place] 3)  [place] [address], [place] [phone]  [phone]   [place] [phone] or [phone] (After Hours)

## 2013-11-07 NOTE — ED Provider Notes (Signed)
Medical screening examination/treatment/procedure(s) were performed by non-physician practitioner and as supervising physician I was immediately available for consultation/collaboration.   EKG Interpretation None        Blanchard Kelch, MD 11/07/13 2154

## 2013-11-07 NOTE — ED Provider Notes (Signed)
Medical screening examination/treatment/procedure(s) were performed by non-physician practitioner and as supervising physician I was immediately available for consultation/collaboration.   EKG Interpretation None        Santiaga Butzin S Gedalia Mcmillon, MD 11/07/13 2154 

## 2013-11-07 NOTE — ED Notes (Signed)
Patient states started having L hip pain yesterday.  Patient denies injury.  She states "it just started hurting".

## 2013-11-07 NOTE — ED Notes (Signed)
Upon examination by PA - patient's pain is not actually hip pain.   It is abdominal pain.

## 2013-11-07 NOTE — ED Provider Notes (Signed)
Pt received in sign out from [employer].  68 y.o. F with PMH significant for HTN, DM, presenting to the ED for left hip pain.  No recent injuries, trauma, or falls.  On exam, pt was found to have some left posterior hip pain as well as LLQ abdominal pain.  No nausea, vomiting, or diarrhea.  No focal neuro deficits on exam.  No loss of bowel or bladder control.  Plan:  Labs and CT abd/pelvis pending.  Dispo accordingly.  Results for orders placed during the hospital encounter of 11/07/13  CBC WITH DIFFERENTIAL      Result Value Ref Range   WBC 7.9  4.0 - 10.5 K/uL   RBC 4.15  3.87 - 5.11 MIL/uL   Hemoglobin 11.6 (*) 12.0 - 15.0 g/dL   HCT 35.2 (*) 36.0 - 46.0 %   MCV 84.8  78.0 - 100.0 fL   MCH 28.0  26.0 - 34.0 pg   MCHC 33.0  30.0 - 36.0 g/dL   RDW 13.5  11.5 - 15.5 %   Platelets 164  150 - 400 K/uL   Neutrophils Relative % 58  43 - 77 %   Neutro Abs 4.6  1.7 - 7.7 K/uL   Lymphocytes Relative 31  12 - 46 %   Lymphs Abs 2.5  0.7 - 4.0 K/uL   Monocytes Relative 8  3 - 12 %   Monocytes Absolute 0.6  0.1 - 1.0 K/uL   Eosinophils Relative 3  0 - 5 %   Eosinophils Absolute 0.2  0.0 - 0.7 K/uL   Basophils Relative 0  0 - 1 %   Basophils Absolute 0.0  0.0 - 0.1 K/uL  BASIC METABOLIC PANEL      Result Value Ref Range   Sodium 145  137 - 147 mEq/L   Potassium 4.0  3.7 - 5.3 mEq/L   Chloride 107  96 - 112 mEq/L   CO2 26  19 - 32 mEq/L   Glucose, Bld 119 (*) 70 - 99 mg/dL   BUN 15  6 - 23 mg/dL   Creatinine, Ser 1.03  0.50 - 1.10 mg/dL   Calcium 9.0  8.4 - 10.5 mg/dL   GFR calc non Af Amer 55 (*) >90 mL/min   GFR calc Af Amer 64 (*) >90 mL/min  URINALYSIS, ROUTINE W REFLEX MICROSCOPIC      Result Value Ref Range   Color, Urine YELLOW  YELLOW   APPearance CLOUDY (*) CLEAR   Specific Gravity, Urine 1.028  1.005 - 1.030   pH 6.0  5.0 - 8.0   Glucose, UA NEGATIVE  NEGATIVE mg/dL   Hgb urine dipstick NEGATIVE  NEGATIVE   Bilirubin Urine NEGATIVE  NEGATIVE   Ketones, ur NEGATIVE   NEGATIVE mg/dL   Protein, ur 30 (*) NEGATIVE mg/dL   Urobilinogen, UA 0.2  0.0 - 1.0 mg/dL   Nitrite NEGATIVE  NEGATIVE   Leukocytes, UA MODERATE (*) NEGATIVE  URINE MICROSCOPIC-ADD ON      Result Value Ref Range   Squamous Epithelial / LPF MANY (*) RARE   WBC, UA 3-6  <3 WBC/hpf   RBC / HPF 0-2  <3 RBC/hpf   Bacteria, UA FEW (*) RARE   Ct Abdomen Pelvis W Contrast  11/07/2013   CLINICAL DATA:  Abdominal and pelvic pain.  EXAM: CT ABDOMEN AND PELVIS WITH CONTRAST  TECHNIQUE: Multidetector CT imaging of the abdomen and pelvis was performed using the standard protocol following bolus administration of intravenous  contrast.  CONTRAST:  28mL OMNIPAQUE IOHEXOL 300 MG/ML  SOLN  COMPARISON:  None.  FINDINGS: The lung bases are clear. No pleural effusion or pulmonary nodule. There is a moderate-sized hiatal hernia. The heart is normal in size. No pericardial effusion.  The liver is unremarkable. No focal hepatic lesions or intrahepatic biliary dilatation. The gallbladder is normal. No common bile duct dilatation. The pancreas is normal. The spleen is normal. The adrenal glands and kidneys are unremarkable.  The stomach, duodenum, small bowel and colon are unremarkable. No inflammatory changes, mass lesions or obstructive findings. The appendix is normal. No mesenteric or retroperitoneal mass or adenopathy. There are atherosclerotic calcifications involving the aorta but no focal aneurysm or dissection. The major branch vessels are patent. She major venous structures are patent.  The uterus is surgically absent. Both ovaries are still present and appear normal except for small calcifications. The bladder is unremarkable. No pelvic mass or adenopathy. No free pelvic fluid collections. No inguinal mass or adenopathy.  Examination bony structures demonstrates a right hip prosthesis. No complicating features are demonstrated. The left hip demonstrates advanced degenerative changes with joint space narrowing,  osteophytic spurring and subchondral cystic change. No fracture or AVN. There are left-sided SI joint degenerative changes suggesting sacroiliitis with small erosions and mild sclerosis.  Moderate lumbar spine degenerative spondylosis.  IMPRESSION: 1. No acute abdominal/pelvic findings, mass lesions or adenopathy. 2. Moderate-sized hiatal hernia. 3. Left-sided sacroiliitis 4. Moderate to advanced left hip joint degenerative changes. Intact right hip prosthesis.   Electronically Signed   By: Kalman Jewels M.D.   On: 11/07/2013 12:26    CT revealing left sacroiliitis, no abdominal findings.  After pain meds pt states she feels better, has been able to ambulate to the bathroom without difficulty.  No remains without focal neuro deficits or signs/sx concerning for cauda equina.  Will be discharged with percocet and prednisone.  She was advised that prednisone may elevate her blood sugar and she should adjust insulin accordingly.  FU with PCP.  Signs or symptoms that would warrant ED return including increasing pain, numbness or paresthesias of lower extremities, loss of bowel or bladder control, gait disturbance, were discussed-- patient acknowledged understanding and agreed with plan of care.  Larene Pickett, PA-C 11/07/13 1453

## 2013-11-19 ENCOUNTER — Encounter: Payer: Self-pay | Admitting: Internal Medicine

## 2013-12-11 ENCOUNTER — Encounter: Payer: Self-pay | Admitting: Internal Medicine

## 2014-01-21 ENCOUNTER — Ambulatory Visit (AMBULATORY_SURGERY_CENTER): Payer: Commercial Managed Care - HMO | Admitting: *Deleted

## 2014-01-21 VITALS — Ht 67.0 in | Wt 206.0 lb

## 2014-01-21 DIAGNOSIS — Z86010 Personal history of colonic polyps: Secondary | ICD-10-CM

## 2014-01-21 MED ORDER — NA SULFATE-K SULFATE-MG SULF 17.5-3.13-1.6 GM/177ML PO SOLN: ORAL | Status: DC

## 2014-01-21 NOTE — Progress Notes (Signed)
Patient denies any allergies to eggs or soy. Patient denies any problems with anesthesia/sedation. Patient denies any oxygen use at home and does not take any diet/weight loss medications. EMMI education assisgned to patient on colonoscopy, this was explained and instructions given to patient. Patient c/o difficulty swallowing meats, States has to drink water then all of it comes back up, states this has been going on for years. PCP is aware and told her to chew food better. She states this does not happen with soft foods. Denies any reflux symptoms, no burning. Made patient office visit to see Dr.Gessner, and told her to call us back if symptoms worsen. Also told her to talk with Dr.Gessner during colonoscopy visit. Pt verbalizes understanding.

## 2014-01-31 ENCOUNTER — Ambulatory Visit (AMBULATORY_SURGERY_CENTER): Payer: 59 | Admitting: Internal Medicine

## 2014-01-31 ENCOUNTER — Encounter: Payer: Self-pay | Admitting: Internal Medicine

## 2014-01-31 VITALS — BP 169/84 | HR 58 | Temp 97.5°F | Resp 14 | Ht 67.0 in | Wt 206.0 lb

## 2014-01-31 DIAGNOSIS — Z86010 Personal history of colonic polyps: Secondary | ICD-10-CM

## 2014-01-31 DIAGNOSIS — D126 Benign neoplasm of colon, unspecified: Secondary | ICD-10-CM

## 2014-01-31 HISTORY — PX: COLONOSCOPY: SHX174

## 2014-01-31 LAB — GLUCOSE, CAPILLARY
GLUCOSE-CAPILLARY: 176 mg/dL — AB (ref 70–99)
Glucose-Capillary: 191 mg/dL — ABNORMAL HIGH (ref 70–99)

## 2014-01-31 MED ORDER — SODIUM CHLORIDE 0.9 % IV SOLN: 500.0000 mL | INTRAVENOUS | Status: DC

## 2014-01-31 NOTE — Patient Instructions (Addendum)
I found and removed 2 tiny polyps today. I suspect you will not need another routine colonoscopy until 2020. I will let you know pathology results and when to have another routine colonoscopy by mail.  I appreciate the opportunity to care for you. Gatha Mayer, MD, FACG YOU HAD AN ENDOSCOPIC PROCEDURE TODAY AT [facility]: Refer to the procedure report that was given to you for any specific questions about what was found during the examination.  If the procedure report does not answer your questions, please call your gastroenterologist to clarify.  If you requested that your care partner not be given the details of your procedure findings, then the procedure report has been included in a sealed envelope for you to review at your convenience later.  YOU SHOULD EXPECT: Some feelings of bloating in the abdomen. Passage of more gas than usual.  Walking can help get rid of the air that was put into your GI tract during the procedure and reduce the bloating. If you had a lower endoscopy (such as a colonoscopy or flexible sigmoidoscopy) you may notice spotting of blood in your stool or on the toilet paper. If you underwent a bowel prep for your procedure, then you may not have a normal bowel movement for a few days.  DIET: Your first meal following the procedure should be a light meal and then it is ok to progress to your normal diet.  A half-sandwich or bowl of soup is an example of a good first meal.  Heavy or fried foods are harder to digest and may make you feel nauseous or bloated.  Likewise meals heavy in dairy and vegetables can cause extra gas to form and this can also increase the bloating.  Drink plenty of fluids but you should avoid alcoholic beverages for 24 hours.  ACTIVITY: Your care partner should take you home directly after the procedure.  You should plan to take it easy, moving slowly for the rest of the day.  You can resume normal activity the day after the procedure  however you should NOT DRIVE or use heavy machinery for 24 hours (because of the sedation medicines used during the test).    SYMPTOMS TO REPORT IMMEDIATELY: A gastroenterologist can be reached at any hour.  During normal business hours, 8:30 AM to 5:00 PM Monday through Friday, call [phone].  After hours and on weekends, please call the GI answering service at [phone] who will take a message and have the physician on call contact you.   Following lower endoscopy (colonoscopy or flexible sigmoidoscopy):  Excessive amounts of blood in the stool  Significant tenderness or worsening of abdominal pains  Swelling of the abdomen that is new, acute  Fever of 100F or higher    FOLLOW UP: If any biopsies were taken you will be contacted by phone or by letter within the next 1-3 weeks.  Call your gastroenterologist if you have not heard about the biopsies in 3 weeks.  Our staff will call the home number listed on your records the next business day following your procedure to check on you and address any questions or concerns that you may have at that time regarding the information given to you following your procedure. This is a courtesy call and so if there is no answer at the home number and we have not heard from you through the emergency physician on call, we will assume that you have returned to your regular daily activities without  incident.  SIGNATURES/CONFIDENTIALITY: You and/or your care partner have signed paperwork which will be entered into your electronic medical record.  These signatures attest to the fact that that the information above on your After Visit Summary has been reviewed and is understood.  Full responsibility of the confidentiality of this discharge information lies with you and/or your care-partner.   [place] office visit for August 31.  Previsit for upper endoscopy August 14, at [place].  Upper endoscopy August 21, at 3:30PM.  Polyp information given.

## 2014-01-31 NOTE — Progress Notes (Signed)
Procedure ends, to recovery, report given and VSS. 

## 2014-01-31 NOTE — Progress Notes (Signed)
Called to room to assist during endoscopic procedure.  Patient ID and intended procedure confirmed with present staff. Received instructions for my participation in the procedure from the performing physician.  

## 2014-01-31 NOTE — Op Note (Addendum)
[facility]  [address] [place], [id number]   COLONOSCOPY PROCEDURE REPORT  PATIENT: Catherine Ramsey, Catherine Ramsey  MR#: [id number] BIRTHDATE: 1946/06/26 , 83  yrs. old GENDER: Female ENDOSCOPIST: Gatha Mayer, MD, [facility] PROCEDURE DATE:  01/31/2014 PROCEDURE:   Colonoscopy with biopsy and snare polypectomy First Screening Colonoscopy - Avg.  risk and is 50 yrs.  old or older - No.  Prior Negative Screening - Now for repeat screening. N/A  History of Adenoma - Now for follow-up colonoscopy & has been > or = to 3 yrs.  Yes hx of adenoma.  Has been 3 or more years since last colonoscopy.  Polyps Removed Today? Yes. ASA CLASS:   Class III INDICATIONS:Patient's personal history of adenomatous colon polyps.  MEDICATIONS: Propofol (Diprivan) 240 mg IV, MAC sedation, administered by CRNA, and These medications were titrated to patient response per physician's verbal order  DESCRIPTION OF PROCEDURE:   After the risks benefits and alternatives of the procedure were thoroughly explained, informed consent was obtained.  A digital rectal exam revealed no abnormalities of the rectum.   The LB [id number]  endoscope was introduced through the anus and advanced to the cecum, which was identified by both the appendix and ileocecal valve. No adverse events experienced.   The quality of the prep was excellent using Suprep  The instrument was then slowly withdrawn as the colon was fully examined.  COLON FINDINGS: Two diminutive sessile polyps were found at the cecum and in the ascending colon.  A polypectomy was performed with cold forceps (ascending) and with a cold snare (cecum).  The resection was complete and the polyp tissue was completely retrieved.   The colon mucosa was otherwise normal.  Retroflexed views revealed no abnormalities. The time to cecum=2 minutes 37 seconds.  Withdrawal time=8 minutes 45 seconds.  The scope was withdrawn and the procedure  completed. COMPLICATIONS: There were no complications.  ENDOSCOPIC IMPRESSION: 1.   Two diminutive sessile polyps were found at the cecum and in the ascending colon; polypectomy was performed with cold forceps and with a cold snare 2.   The colon mucosa was otherwise normal - exellent prep - hx pre-cancerous polyps 2007 and 2011  RECOMMENDATIONS: 1.  Timing of repeat colonoscopy will be determined by pathology findings. 2.   Likely 2020 (5 yrs) 3.   Having recurrent solid dysphagia - will arrange for EGD/dilation (direct)   eSigned:  Gatha Mayer, MD, [facility] 01/31/2014 9:26 AM Revised: 01/31/2014 9:26 AM  cc: The Patient and Leanna Battles, MD

## 2014-02-04 ENCOUNTER — Telehealth: Payer: Self-pay | Admitting: *Deleted

## 2014-02-04 NOTE — Telephone Encounter (Signed)
No answer.Message left for the patient. 

## 2014-02-04 NOTE — Telephone Encounter (Signed)
Duplicate

## 2014-02-08 ENCOUNTER — Encounter: Payer: Self-pay | Admitting: Internal Medicine

## 2014-02-08 NOTE — Progress Notes (Signed)
Quick Note:  Polyps not pre-cancerous - repeat colonoscopy 2020 (prior adenomas) ______

## 2014-03-08 ENCOUNTER — Encounter: Payer: Self-pay | Admitting: Internal Medicine

## 2014-03-15 ENCOUNTER — Ambulatory Visit (AMBULATORY_SURGERY_CENTER): Payer: Commercial Managed Care - HMO

## 2014-03-15 VITALS — Ht 67.5 in | Wt 204.4 lb

## 2014-03-15 DIAGNOSIS — R1319 Other dysphagia: Secondary | ICD-10-CM

## 2014-03-15 NOTE — Progress Notes (Signed)
Per pt, no allergies to soy or egg products.Pt not taking any weight loss meds or using  O2 at home. 

## 2014-03-21 ENCOUNTER — Encounter (HOSPITAL_COMMUNITY): Payer: Self-pay

## 2014-03-21 ENCOUNTER — Ambulatory Visit (AMBULATORY_SURGERY_CENTER): Payer: Commercial Managed Care - HMO | Admitting: Internal Medicine

## 2014-03-21 ENCOUNTER — Other Ambulatory Visit: Payer: Self-pay

## 2014-03-21 ENCOUNTER — Encounter: Payer: Self-pay | Admitting: Internal Medicine

## 2014-03-21 VITALS — BP 161/80 | HR 62 | Temp 97.3°F | Resp 15 | Ht 67.5 in | Wt 204.0 lb

## 2014-03-21 DIAGNOSIS — K208 Other esophagitis without bleeding: Secondary | ICD-10-CM

## 2014-03-21 DIAGNOSIS — B3781 Candidal esophagitis: Secondary | ICD-10-CM

## 2014-03-21 DIAGNOSIS — R1319 Other dysphagia: Secondary | ICD-10-CM | POA: Diagnosis present

## 2014-03-21 DIAGNOSIS — R1310 Dysphagia, unspecified: Secondary | ICD-10-CM

## 2014-03-21 DIAGNOSIS — K209 Esophagitis, unspecified without bleeding: Secondary | ICD-10-CM

## 2014-03-21 LAB — GLUCOSE, CAPILLARY
Glucose-Capillary: 180 mg/dL — ABNORMAL HIGH (ref 70–99)
Glucose-Capillary: 161 mg/dL — ABNORMAL HIGH (ref 70–99)

## 2014-03-21 SURGERY — MANOMETRY, ESOPHAGUS
Anesthesia: Topical

## 2014-03-21 SURGERY — MANOMETRY, ESOPHAGUS

## 2014-03-21 MED ORDER — SODIUM CHLORIDE 0.9 % IV SOLN: 500.0000 mL | INTRAVENOUS | Status: DC

## 2014-03-21 NOTE — Patient Instructions (Addendum)
The esophagus looks like it is not squeezing and relaxing as it should. You need further testing -   1) a barium swallow (drink barium to check esophagus)   2) an esophageal manometry (slender tube is inserted into esophagus for a few minutes to measure esophagus function)  My office will set these up.  I appreciate the opportunity to care for you. Gatha Mayer, MD, FACG  YOU HAD AN ENDOSCOPIC PROCEDURE TODAY AT [facility]: Refer to the procedure report that was given to you for any specific questions about what was found during the examination.  If the procedure report does not answer your questions, please call your gastroenterologist to clarify.  If you requested that your care partner not be given the details of your procedure findings, then the procedure report has been included in a sealed envelope for you to review at your convenience later.  YOU SHOULD EXPECT: Some feelings of bloating in the abdomen. Passage of more gas than usual.  Walking can help get rid of the air that was put into your GI tract during the procedure and reduce the bloating. If you had a lower endoscopy (such as a colonoscopy or flexible sigmoidoscopy) you may notice spotting of blood in your stool or on the toilet paper. If you underwent a bowel prep for your procedure, then you may not have a normal bowel movement for a few days.  DIET: Your first meal following the procedure should be a light meal and then it is ok to progress to your normal diet.  A half-sandwich or bowl of soup is an example of a good first meal.  Heavy or fried foods are harder to digest and may make you feel nauseous or bloated.  Likewise meals heavy in dairy and vegetables can cause extra gas to form and this can also increase the bloating.  Drink plenty of fluids but you should avoid alcoholic beverages for 24 hours.  ACTIVITY: Your care partner should take you home directly after the procedure.  You should plan to take it  easy, moving slowly for the rest of the day.  You can resume normal activity the day after the procedure however you should NOT DRIVE or use heavy machinery for 24 hours (because of the sedation medicines used during the test).    SYMPTOMS TO REPORT IMMEDIATELY: A gastroenterologist can be reached at any hour.  During normal business hours, 8:30 AM to 5:00 PM Monday through Friday, call [phone].  After hours and on weekends, please call the GI answering service at [phone] who will take a message and have the physician on call contact you.   Following upper endoscopy (EGD)  Vomiting of blood or coffee ground material  New chest pain or pain under the shoulder blades  Painful or persistently difficult swallowing  New shortness of breath  Fever of 100F or higher  Black, tarry-looking stools  FOLLOW UP: If any biopsies were taken you will be contacted by phone or by letter within the next 1-3 weeks.  Call your gastroenterologist if you have not heard about the biopsies in 3 weeks.  Our staff will call the home number listed on your records the next business day following your procedure to check on you and address any questions or concerns that you may have at that time regarding the information given to you following your procedure. This is a courtesy call and so if there is no answer at the home number and  we have not heard from you through the emergency physician on call, we will assume that you have returned to your regular daily activities without incident.  SIGNATURES/CONFIDENTIALITY: You and/or your care partner have signed paperwork which will be entered into your electronic medical record.  These signatures attest to the fact that that the information above on your After Visit Summary has been reviewed and is understood.  Full responsibility of the confidentiality of this discharge information lies with you and/or your care-partner.

## 2014-03-21 NOTE — Op Note (Signed)
[facility]  [address] [place], [id number]   ENDOSCOPY PROCEDURE REPORT  PATIENT: Catherine, Ramsey  MR#: [id number] BIRTHDATE: 30-Jan-1946 , 50  yrs. old GENDER: Female ENDOSCOPIST: Gatha Mayer, MD, [facility] PROCEDURE DATE:  03/21/2014 PROCEDURE:  EGD w/ biopsy ASA CLASS:     Class II INDICATIONS:  Dysphagia. MEDICATIONS: propofol (Diprivan) 200mg  IV, MAC sedation, administered by CRNA, and These medications were titrated to patient response per physician's verbal order TOPICAL ANESTHETIC: none  DESCRIPTION OF PROCEDURE: After the risks benefits and alternatives of the procedure were thoroughly explained, informed consent was obtained.  The LB [id number] endoscope was introduced through the mouth and advanced to the second portion of the duodenum. Without limitations.  The instrument was slowly withdrawn as the mucosa was fully examined.     ESOPHAGUS: Dilated, diffuse white mucosal plaques.  Biopsies taken. Stenosis at GE junction, passed with slight scope pressure.  The remainder of the upper endoscopy exam was otherwise normal. Retroflexed views revealed no abnormalities.     The scope was then withdrawn from the patient and the procedure completed.  COMPLICATIONS: There were no complications. ENDOSCOPIC IMPRESSION: 1.   Dilated, diffuse white plaques.  Biopsies taken. 2.   Stenosis at GE junction, passed with slight scope pressure.  - Overall findings suggest achalasia 3.   The remainder of the upper endoscopy exam was otherwise normal  RECOMMENDATIONS: 1.  Await biopsy results 2.  Schedule barium swallow with tablet and esophageal manometry to evaluate dysphagia.   eSigned:  Gatha Mayer, MD, [facility] 03/21/2014 2:09 PM   XT:GGYIRS Philip Aspen, MD and The Patient

## 2014-03-21 NOTE — Progress Notes (Signed)
Report to PACU, RN, vss, BBS= Clear.  

## 2014-03-21 NOTE — Progress Notes (Signed)
Called to room to assist during endoscopic procedure.  Patient ID and intended procedure confirmed with present staff. Received instructions for my participation in the procedure from the performing physician.  

## 2014-03-22 ENCOUNTER — Telehealth: Payer: Self-pay

## 2014-03-22 ENCOUNTER — Encounter: Payer: Commercial Managed Care - HMO | Admitting: Internal Medicine

## 2014-03-22 ENCOUNTER — Ambulatory Visit (HOSPITAL_COMMUNITY): Admit: 2014-03-22 | Payer: Self-pay | Admitting: Internal Medicine

## 2014-03-22 ENCOUNTER — Telehealth: Payer: Self-pay | Admitting: *Deleted

## 2014-03-22 NOTE — Telephone Encounter (Signed)
Message copied by Greggory Keen on Fri Mar 22, 2014  3:16 PM ------      Message from: Silvano Rusk E      Created: Thu Mar 21, 2014  2:13 PM      Regarding: needs mano and ba swallow       She will need a ba swallow with tablet and an esophageal manometry scheduled (just completed EGD)             ------

## 2014-03-22 NOTE — Telephone Encounter (Signed)
Patient advised to go to [facility] 03/28/14 at 10:15 for barium swallow with tablet and again on 04/01/14 at 8:15 for esophageal manometry. Instructed on NPO and do not take her insulin until after procedure. Patient expresses understanding.

## 2014-03-22 NOTE — Telephone Encounter (Signed)
  Follow up Call-  Call back number 03/21/2014 01/31/2014  Post procedure Call Back phone  # [phone]  Permission to leave phone message Yes Yes     Patient questions:  Message left to call us if necessary.

## 2014-03-26 ENCOUNTER — Encounter: Payer: Self-pay | Admitting: Internal Medicine

## 2014-03-26 DIAGNOSIS — B3781 Candidal esophagitis: Secondary | ICD-10-CM

## 2014-03-26 HISTORY — DX: Candidal esophagitis: B37.81

## 2014-03-26 NOTE — Progress Notes (Signed)
Quick Note:  She has Candida esophagitis - Tx with fluconazole 100 mg - take 2 day 1 then 1 each day total 21 days # 22 no refill  Await Ba swallow also ______

## 2014-03-28 ENCOUNTER — Ambulatory Visit (HOSPITAL_COMMUNITY)
Admission: RE | Admit: 2014-03-28 | Discharge: 2014-03-28 | Disposition: A | Discharge status: Home / Self Care | Payer: Medicare HMO | Source: Ambulatory Visit | Attending: Internal Medicine | Admitting: Internal Medicine

## 2014-03-28 ENCOUNTER — Other Ambulatory Visit: Payer: Self-pay

## 2014-03-28 DIAGNOSIS — R1310 Dysphagia, unspecified: Secondary | ICD-10-CM | POA: Diagnosis present

## 2014-03-28 DIAGNOSIS — K220 Achalasia of cardia: Secondary | ICD-10-CM | POA: Insufficient documentation

## 2014-03-28 MED ORDER — FLUCONAZOLE 100 MG PO TABS: ORAL_TABLET | ORAL | Status: DC

## 2014-03-28 NOTE — Progress Notes (Signed)
Quick Note:  Let her know this test shows achalasia- esophagus does not squeeze properly and valve between esophagus and stomach does not relax properly  She has esophageal mano Monday which will be last test - after that will likely recommend she have surgery to fix - i will call her after the Virgel Gess is done (may take until Wed to get results)  In mean time try Levsin SL 0.125 mg Sl before meals # 90 no refills ______

## 2014-03-29 ENCOUNTER — Other Ambulatory Visit: Payer: Self-pay

## 2014-03-29 MED ORDER — HYOSCYAMINE SULFATE 0.125 MG SL SUBL: SUBLINGUAL_TABLET | SUBLINGUAL | Status: DC

## 2014-04-01 ENCOUNTER — Ambulatory Visit: Payer: Commercial Managed Care - HMO | Admitting: Internal Medicine

## 2014-04-01 ENCOUNTER — Ambulatory Visit (HOSPITAL_COMMUNITY)
Admission: RE | Admit: 2014-04-01 | Discharge: 2014-04-01 | Disposition: A | Discharge status: Home / Self Care | Payer: Medicare HMO | Source: Ambulatory Visit | Attending: Internal Medicine | Admitting: Internal Medicine

## 2014-04-01 ENCOUNTER — Encounter (HOSPITAL_COMMUNITY)
Admission: RE | Disposition: A | Discharge status: Home / Self Care | Payer: Self-pay | Source: Ambulatory Visit | Attending: Internal Medicine

## 2014-04-01 DIAGNOSIS — K220 Achalasia of cardia: Secondary | ICD-10-CM | POA: Diagnosis not present

## 2014-04-01 DIAGNOSIS — R1310 Dysphagia, unspecified: Secondary | ICD-10-CM | POA: Insufficient documentation

## 2014-04-01 HISTORY — DX: Achalasia of cardia: K22.0

## 2014-04-01 HISTORY — PX: ESOPHAGEAL MANOMETRY: SHX5429

## 2014-04-01 SURGERY — MANOMETRY, ESOPHAGUS

## 2014-04-01 MED ORDER — LIDOCAINE VISCOUS 2 % MT SOLN
OROMUCOSAL | Status: AC
  Filled 2014-04-01: qty 15

## 2014-04-01 SURGICAL SUPPLY — 2 items
FACESHIELD LNG OPTICON STERILE (SAFETY) IMPLANT
GLOVE BIO SURGEON STRL SZ8 (GLOVE) ×6 IMPLANT

## 2014-04-02 ENCOUNTER — Encounter (HOSPITAL_COMMUNITY): Payer: Self-pay | Admitting: Internal Medicine

## 2014-04-02 NOTE — Op Note (Signed)
Patient: Catherine Ramsey, Catherine Ramsey   [id number] Gender: Female Physician: Dr. Silvano Rusk   DOB / Age: [birth year] Operator: Verlon Au, RN, BSN   Height: 5 ft 7 in Referring Physician:    Procedure: EMZ Examination Date: 04/01/2014    Swallow Composite (mean of 10 swallows) Resting Pressure Profile & Anatomy     Basal Pressures* LES, respiratory mean(mmHg) 58.8 (13-43) UES mean(mmHg) 103.0 (34-104)  Anatomy* LES proximal(cm) 42.2 LES intraabdominal(cm) 3.0 Esophageal length(cm) 24.9 Hiatal hernia No    Motility* Distal contr. integral(mmHg-cm-s) N/A [phone]) Distal contr. int. (highest)(mmHg-cm-s) N/A Incomplete bolus clearance(%) 100  Residual Pressures* LES (mean)(mmHg) 38.4 (<15.0) UES (mean)(mmHg) 11.4 (<12.0)  *Notes. Motility values are mean among swallows; Normal values in (xxx.x):  Simultaneous contractions: Velocity > 8.0 cm/s; eSlv: eSleeve; 3SN, IRP, DCI, IBP - See manual definitions  Lower Esophageal Sphincter Region  Normal Esophageal Motility  Normal  Landmarks   Number of swallows evaluated 10       Proximal LES (from nares)(cm) 42.2  High Resolution Parameters        LES length(cm) 5.7 2.7-4.8     Distal contractile integral(mean)(mmHg-cm-s) N/A [phone]      Esophageal length (LES-UES centers)(cm) 24.9      Distal contractile integral(highest)(mmHg-cm-s) N/A       Intraabdominal LES length(cm) 3.0      Contractile front velocity(cm/s) N/A <9.0      Hiatal hernia? No  Chicago Classification    LES Pressures       Distal latency N/A       Pressure meas. method eSleeve,IRP      % failed (Chicago Classification) 100       Basal (respiratory min.)(mmHg) 36.0 4.8-32.0     % panesophageal pressurization 100       Basal (respiratory mean)(mmHg) 58.8 13-43     % premature contraction 0       Residual (mean)(mmHg) 38.4 <15.0     % rapid contraction 0          % large breaks 0          % small breaks 0      Impedance analysis           Incomplete bolus clearance(%) 100          Upper Esophageal Sphincter  Normal Pharyngeal / UES Motility  Normal  Mean basal pressure(mmHg) 103.0 34-104 No. swallows evaluated 10   Mean residual pressure(mmHg) 11.4 <12.0 Evaluated @ 3.0 & N/A above UES           Mean peak pressure(mmHg) 12.1       Chicago Classification Findings*  EGJ: Outflow obstruction         Mean IRP (38.4 mmHg) is greater than 15 mmHg Esophageal body: No normal peristalsis, Panesophageal pressurization          % panesophageal pressurization (100%) is greater than 20% Finding: Achalasia With Esophageal Compression (Subtype II) * Findings are based on published [place] Classification scheme and are only intended to serve as a guide for patient diagnosis   Procedure  After confirmation of potential allergies, a topical analgesic was used to numb the nares followed by trans-nasal insertion of a High Resolution Manometry Catheter. Pressure bands of both UES and LES were observed on the color contour. Patient instructed to take deep breath to verify placement of catheter; diaphragmatic pinch noted on inspiration. Patient was assisted to supine position and catheter was stabilized. Patient encouraged to relax while acclimating to catheter for approximately  5 minutes. A 30 second baseline pressure was obtained to identify the UES and LES followed by a series of wet swallows using 5 ccs room temperature water to assess esophageal motility. At the conclusion of the procedure; the catheter was removed.   Indications  Dysphagia  Impression : [place] Type II  Plan :  Refer for Laparoscopic Heller Myotomy  Esigned : Gatha Mayer, MD, [facility] 1700 04/02/14

## 2014-05-22 ENCOUNTER — Encounter (HOSPITAL_BASED_OUTPATIENT_CLINIC_OR_DEPARTMENT_OTHER): Admitting: General Surgery

## 2014-07-03 ENCOUNTER — Other Ambulatory Visit: Payer: Self-pay | Admitting: Internal Medicine

## 2014-07-03 DIAGNOSIS — Z1231 Encounter for screening mammogram for malignant neoplasm of breast: Secondary | ICD-10-CM

## 2014-07-30 ENCOUNTER — Ambulatory Visit: Payer: Commercial Managed Care - HMO

## 2014-08-27 ENCOUNTER — Ambulatory Visit
Admission: RE | Admit: 2014-08-27 | Discharge: 2014-08-27 | Disposition: A | Discharge status: Home / Self Care | Payer: Commercial Managed Care - HMO | Source: Ambulatory Visit | Attending: Internal Medicine | Admitting: Internal Medicine

## 2014-08-27 DIAGNOSIS — Z1231 Encounter for screening mammogram for malignant neoplasm of breast: Secondary | ICD-10-CM | POA: Diagnosis not present

## 2014-08-28 ENCOUNTER — Other Ambulatory Visit: Payer: Self-pay | Admitting: Internal Medicine

## 2014-08-28 DIAGNOSIS — R928 Other abnormal and inconclusive findings on diagnostic imaging of breast: Secondary | ICD-10-CM

## 2014-09-02 ENCOUNTER — Other Ambulatory Visit: Payer: Commercial Managed Care - HMO

## 2014-09-04 ENCOUNTER — Ambulatory Visit
Admission: RE | Admit: 2014-09-04 | Discharge: 2014-09-04 | Disposition: A | Discharge status: Home / Self Care | Payer: Commercial Managed Care - HMO | Source: Ambulatory Visit | Attending: Internal Medicine | Admitting: Internal Medicine

## 2014-09-04 DIAGNOSIS — N6489 Other specified disorders of breast: Secondary | ICD-10-CM | POA: Diagnosis not present

## 2014-09-04 DIAGNOSIS — R928 Other abnormal and inconclusive findings on diagnostic imaging of breast: Secondary | ICD-10-CM

## 2014-10-21 ENCOUNTER — Other Ambulatory Visit: Payer: Self-pay | Admitting: *Deleted

## 2014-10-21 NOTE — Patient Outreach (Signed)
Telephone call to patient; left message on voice mail requesting call back.    Sherrin Daisy, RN BSN CCM [phone] [facility] Care Management

## 2014-10-28 ENCOUNTER — Ambulatory Visit: Payer: Self-pay | Admitting: *Deleted

## 2014-10-30 ENCOUNTER — Other Ambulatory Visit: Payer: Self-pay | Admitting: *Deleted

## 2014-10-30 NOTE — Patient Outreach (Signed)
Catherine Ramsey) Care Management  10/30/2014  Catherine Ramsey 01-Jun-1946 [id number]   [employer] delegation referral: Telephone call to patient; HIPPA verification received from patient. Patient verified that she currently has [employer].   Patient states major health issues are diabetes, hypertension and gastric reflux which are currently being managed medically.   Plan-follow up with patient regarding enrolling in disease management program for diabetes; contact doctor regarding insulin dosage discrepancy.  Sherrin Daisy, RN BSN [place] Management Coordinator [facility] Care Management  [phone]

## 2014-10-31 ENCOUNTER — Other Ambulatory Visit: Payer: Self-pay | Admitting: *Deleted

## 2014-10-31 NOTE — Patient Outreach (Signed)
[facility]) Care Management  10/31/2014  Catherine Ramsey 01/04/1946 [id number]   Telephone call to patient regarding disease management program for diabetes mellitus.   Patient consents to [facility] care management services.    Appointment made with patient to start assessments to determine educational needs. Patient agrees to time for next appointment.  Plan: will send Welcome Packet to patient.  Sherrin Daisy, RN BSN [place] Management Coordinator [facility] Care Management  [phone]

## 2014-11-01 ENCOUNTER — Encounter: Payer: Self-pay | Admitting: *Deleted

## 2014-11-05 ENCOUNTER — Other Ambulatory Visit: Payer: Self-pay | Admitting: *Deleted

## 2014-11-05 NOTE — Patient Outreach (Signed)
[place] [facility]) Care Management  11/05/2014   CHEMIKA NIGHTENGALE 02-Oct-1945 [id number]  [employer] referral:  Telephone call to patient to start health assessments and determine care plan goals.  She accepted disease management program.   Sherrin Daisy, RN BSN [place] Management Coordinator [facility] Care Management  [phone]

## 2014-11-05 NOTE — Patient Instructions (Signed)
Hemoglobin A1c Test The hemoglobin (HbA1c) test measures the average amount of sugar (glucose) in your blood during the 2-3 months just before the test (rather than measuring the current amount of glucose in the sample of blood). Some of the glucose that circulates in your blood binds to blood proteins. Hemoglobin (Hb) is a blood protein that carries oxygen in the red blood cells (RBCs). When glucose binds to Hb, the glucose-coated Hb is called glycated Hb. Once Hb is glycated, it remains that way for the life of the RBC (about 120 days). The HbA1c test is used to diagnose diabetes mellitus and to monitor long-term control of blood sugar in people who have diabetes mellitus. The HbA1c test can be used in place of or in combination with fasting blood glucose level and oral glucose tolerance tests. There are several methods for measuring the concentration of HbA1c in the blood. One method uses antibodies that bind specifically to HbA1c. A lab instrument then measures the concentration of bound antibodies in a sample. A second method called ion exchange high-pressure liquid chromatography separates HbA1c from other types of Hb on the basis of differences in electrical charge. A third method for measuring HbA1c uses enzymes that react specifically with the glucose on HbA1c to produce a measurable color change. RESULTS  It is your responsibility to obtain your test results. Ask the lab or department performing the test when and how you will get your results. Contact your health care provider to discuss any questions you have about your results.  Range of Normal Values Ranges for normal values may vary among different labs and hospitals. You should always check with your health care provider after having lab work or other tests done to discuss the meaning of your test results and whether your values are considered within normal limits. The range for normal HbA1c test results is from 4.5% to less than 5.7%. Meaning  of Results Outside Normal Value Ranges Abnormally high HbA1c values are most commonly an indication of prediabetes mellitus and diabetes mellitus:  An HbA1c result of 5.7-6.4% is considered diagnostic of prediabetes mellitus.  An HbA1c result of 6.5% or higher on two separate occasions is considered diagnostic of diabetes mellitus. There are certain conditions that can cause a falsely low HbA1c test result. These conditions include pregnancy, blood loss, blood transfusions, anemia caused by premature destruction of red blood cells (hemolytic anemia), and certain unusual forms of Hb (Hb variants), such as sickle cell trait. There are certain conditions that can cause a falsely low HbA1c test result. These conditions include iron deficiency, kidney failure, and certain Hb variants. Document Released: 08/10/2004 Document Revised: 12/03/2013 Document Reviewed: 06/15/2012 [facility] Patient Information 2015 [place], [place]. This information is not intended to replace advice given to you by your health care provider. Make sure you discuss any questions you have with your health care provider.

## 2014-11-14 ENCOUNTER — Other Ambulatory Visit: Payer: Self-pay | Admitting: *Deleted

## 2014-11-14 NOTE — Patient Outreach (Signed)
[place] [facility]) Care Management  11/14/2014  KENYATTE GRUBER 07-07-1946 [id number]  Telephone call to patient; unable to talk now. Requesting a call back next week regarding disease management program. [place] .  Plan: Make appointment to continue health assessments and disease management. Patient agrees with telephonic appointment set up for next week .  Sherrin Daisy, RN BSN [place] Management Coordinator [facility] Care Management  [phone]

## 2014-11-15 ENCOUNTER — Encounter: Payer: Self-pay | Admitting: *Deleted

## 2014-11-15 NOTE — Patient Outreach (Signed)
[facility]) Care Management  11/15/2014  Catherine Ramsey 11-27-1945 [id number]  Late entry for 11/05/14-see additional documentation encounter for 11/05/14  Sherrin Daisy, [place] Management Coordinator [facility] Care Management  [phone]

## 2014-11-19 ENCOUNTER — Other Ambulatory Visit: Payer: Self-pay | Admitting: *Deleted

## 2014-11-19 NOTE — Patient Outreach (Addendum)
[facility] [facility]) Care Management  11/19/2014  Catherine Ramsey [birth year] [id number]  Telephone call to patient; left message on voice mail requesting call back.  Plan; will follow up and continue with care plan.  Sherrin Daisy, RN BSN [place] Management Coordinator [facility] Care Management  [phone]

## 2014-11-29 ENCOUNTER — Other Ambulatory Visit: Payer: Self-pay | Admitting: *Deleted

## 2014-11-29 NOTE — Patient Outreach (Signed)
[place] [facility]) Care Management  11/29/2014  Catherine Ramsey 1946/06/24 [id number]   Follow up call to patient; left message requesting call back  Plan: will follow with patient next week.  Sherrin Daisy, RN BSN [place] Management Coordinator [facility] Care Management  [phone]

## 2014-12-02 ENCOUNTER — Other Ambulatory Visit: Payer: Self-pay | Admitting: *Deleted

## 2014-12-02 ENCOUNTER — Encounter: Payer: Self-pay | Admitting: *Deleted

## 2014-12-02 NOTE — Patient Outreach (Signed)
[place] [facility]) Care Management  12/02/2014  HENRIETTA CIESLEWICZ 1945-10-08 [id number]  Follow up call to patient for disease management. Patient voices that she has reviewed educational materials that were sent. States currently she is getting Hemoglobin A1c levels drawn every 6 months.   Explanations & re-inforcement of care plan reviewed with patient. Teach back method used with patient. Care plan updated.  Plan: Will follow up and continue with  care plan goals. Patient agrees with time set for next appointment.  Patient understands that appointment will be telephonic.   Sherrin Daisy, RN BSN [place] Management Coordinator [facility] Care Management  [phone]

## 2014-12-05 NOTE — Progress Notes (Signed)
This encounter was created in error - please disregard.

## 2014-12-17 ENCOUNTER — Other Ambulatory Visit: Payer: Commercial Managed Care - HMO | Admitting: *Deleted

## 2014-12-17 ENCOUNTER — Encounter: Payer: Self-pay | Admitting: *Deleted

## 2014-12-17 NOTE — Patient Outreach (Signed)
[facility]) Care Management  12/17/2014  Catherine Ramsey Oct 19, 1945 [id number]  Follow up telephone call to patient for disease management.  Patient voices that she has not had hospital admission or emergency room  visit since our last call. States next doctor's appointment in June 2016.States no changes in medications.  Patient was able to state reasons that hemoglobin A1c level should be checked and knew complications  of elevated level (hemoglobin A1c.)  Plan: will continue to follow care plan. Patient agrees to appointment set for next call.  Sherrin Daisy, RN BSN [place] Management Coordinator [facility] Care Management  [phone]

## 2015-01-03 ENCOUNTER — Encounter: Payer: Self-pay | Admitting: Internal Medicine

## 2015-02-12 ENCOUNTER — Other Ambulatory Visit: Payer: Self-pay | Admitting: *Deleted

## 2015-02-12 NOTE — Patient Outreach (Signed)
[place] [facility]) Care Management  02/12/2015  Catherine Ramsey May 02, 1946 [id number]   Follow up call to patient; left message on voice mail requesting return call.  Plan: will follow up.

## 2015-02-18 ENCOUNTER — Other Ambulatory Visit: Payer: Self-pay | Admitting: *Deleted

## 2015-02-18 ENCOUNTER — Encounter: Payer: Self-pay | Admitting: *Deleted

## 2015-02-18 NOTE — Patient Outreach (Signed)
[place] [facility]) Care Management  02/18/2015  Catherine Ramsey Jul 11, 1946 [id number]   Follow up call to patient. Patient voices that she is feeling well and exercising at least 3 days weekly with walking and use of exercise equipment. States blood sugar level has been stable. Plans to call to make appointment for follow up visit with primary care provider for end of summer.   Patient states she has been in contact with customer service with her insurance company and has been able to get information that she needed. States she has received Scientist, clinical (histocompatibility and immunogenetics) of diabetes and reviewed. Sates review was good for her.   Patient goals have been met as noted. .  Plan: will close out case and send closure letter to primary care to provider.   Sherrin Daisy, RN BSN [place] Management Coordinator [facility] Care Management  [phone]

## 2015-03-14 NOTE — Patient Outreach (Signed)
[place] [facility]) Care Management  03/07/2015  Catherine Ramsey 02/26/1946 [id number]   Notification from Sherrin Daisy, RN to close case due to goals met with [place] Management.  Thanks, Ronnell Freshwater. [place], [place] Assistant Phone: [phone] Fax: [phone]

## 2015-03-20 DIAGNOSIS — G589 Mononeuropathy, unspecified: Secondary | ICD-10-CM | POA: Insufficient documentation

## 2015-03-20 DIAGNOSIS — E1165 Type 2 diabetes mellitus with hyperglycemia: Secondary | ICD-10-CM | POA: Insufficient documentation

## 2015-03-20 DIAGNOSIS — E1151 Type 2 diabetes mellitus with diabetic peripheral angiopathy without gangrene: Secondary | ICD-10-CM | POA: Diagnosis not present

## 2015-04-17 DIAGNOSIS — M542 Cervicalgia: Secondary | ICD-10-CM | POA: Diagnosis not present

## 2015-04-17 DIAGNOSIS — M791 Myalgia: Secondary | ICD-10-CM | POA: Diagnosis not present

## 2015-06-25 DIAGNOSIS — R300 Dysuria: Secondary | ICD-10-CM | POA: Diagnosis not present

## 2015-06-25 DIAGNOSIS — E1151 Type 2 diabetes mellitus with diabetic peripheral angiopathy without gangrene: Secondary | ICD-10-CM | POA: Diagnosis not present

## 2015-06-25 DIAGNOSIS — Z6834 Body mass index (BMI) 34.0-34.9, adult: Secondary | ICD-10-CM | POA: Diagnosis not present

## 2015-06-25 DIAGNOSIS — I10 Essential (primary) hypertension: Secondary | ICD-10-CM | POA: Diagnosis not present

## 2015-06-25 DIAGNOSIS — I739 Peripheral vascular disease, unspecified: Secondary | ICD-10-CM | POA: Diagnosis not present

## 2015-06-25 DIAGNOSIS — M545 Low back pain: Secondary | ICD-10-CM | POA: Diagnosis not present

## 2015-07-05 DIAGNOSIS — M1712 Unilateral primary osteoarthritis, left knee: Secondary | ICD-10-CM | POA: Diagnosis not present

## 2015-07-05 DIAGNOSIS — M25469 Effusion, unspecified knee: Secondary | ICD-10-CM | POA: Diagnosis not present

## 2015-07-05 DIAGNOSIS — M25462 Effusion, left knee: Secondary | ICD-10-CM | POA: Diagnosis not present

## 2015-07-07 DIAGNOSIS — M25562 Pain in left knee: Secondary | ICD-10-CM | POA: Insufficient documentation

## 2015-07-17 DIAGNOSIS — Z6834 Body mass index (BMI) 34.0-34.9, adult: Secondary | ICD-10-CM | POA: Diagnosis not present

## 2015-07-17 DIAGNOSIS — I10 Essential (primary) hypertension: Secondary | ICD-10-CM | POA: Diagnosis not present

## 2015-07-17 DIAGNOSIS — E1165 Type 2 diabetes mellitus with hyperglycemia: Secondary | ICD-10-CM | POA: Diagnosis not present

## 2015-07-31 DIAGNOSIS — Z6834 Body mass index (BMI) 34.0-34.9, adult: Secondary | ICD-10-CM | POA: Diagnosis not present

## 2015-07-31 DIAGNOSIS — I10 Essential (primary) hypertension: Secondary | ICD-10-CM | POA: Diagnosis not present

## 2015-07-31 DIAGNOSIS — E1165 Type 2 diabetes mellitus with hyperglycemia: Secondary | ICD-10-CM | POA: Diagnosis not present

## 2015-08-14 DIAGNOSIS — Z6834 Body mass index (BMI) 34.0-34.9, adult: Secondary | ICD-10-CM | POA: Diagnosis not present

## 2015-08-14 DIAGNOSIS — E1165 Type 2 diabetes mellitus with hyperglycemia: Secondary | ICD-10-CM | POA: Diagnosis not present

## 2015-08-14 DIAGNOSIS — I10 Essential (primary) hypertension: Secondary | ICD-10-CM | POA: Diagnosis not present

## 2015-11-04 DIAGNOSIS — K222 Esophageal obstruction: Secondary | ICD-10-CM | POA: Diagnosis not present

## 2015-11-04 DIAGNOSIS — K219 Gastro-esophageal reflux disease without esophagitis: Secondary | ICD-10-CM | POA: Diagnosis not present

## 2015-11-04 DIAGNOSIS — I7389 Other specified peripheral vascular diseases: Secondary | ICD-10-CM | POA: Diagnosis not present

## 2015-11-04 DIAGNOSIS — E1151 Type 2 diabetes mellitus with diabetic peripheral angiopathy without gangrene: Secondary | ICD-10-CM | POA: Diagnosis not present

## 2015-11-04 DIAGNOSIS — Z6833 Body mass index (BMI) 33.0-33.9, adult: Secondary | ICD-10-CM | POA: Diagnosis not present

## 2015-11-04 DIAGNOSIS — I10 Essential (primary) hypertension: Secondary | ICD-10-CM | POA: Diagnosis not present

## 2015-12-25 ENCOUNTER — Ambulatory Visit (INDEPENDENT_AMBULATORY_CARE_PROVIDER_SITE_OTHER): Payer: Commercial Managed Care - HMO | Admitting: Physician Assistant

## 2015-12-25 ENCOUNTER — Telehealth: Payer: Self-pay | Admitting: *Deleted

## 2015-12-25 ENCOUNTER — Encounter: Payer: Self-pay | Admitting: Physician Assistant

## 2015-12-25 ENCOUNTER — Other Ambulatory Visit: Payer: Self-pay | Admitting: *Deleted

## 2015-12-25 VITALS — BP 130/60 | HR 70 | Ht 67.0 in | Wt 211.0 lb

## 2015-12-25 DIAGNOSIS — K220 Achalasia of cardia: Secondary | ICD-10-CM

## 2015-12-25 DIAGNOSIS — R1031 Right lower quadrant pain: Secondary | ICD-10-CM

## 2015-12-25 DIAGNOSIS — G8929 Other chronic pain: Secondary | ICD-10-CM

## 2015-12-25 DIAGNOSIS — R1032 Left lower quadrant pain: Secondary | ICD-10-CM

## 2015-12-25 NOTE — Patient Instructions (Signed)
Continue to do a soft diet. We are referring you to [facility] for a consult with Dr. Zella Richer. We will call you with the appointment.

## 2015-12-25 NOTE — Progress Notes (Signed)
Patient ID: Catherine Ramsey, female   DOB: 04-29-1946, 70 y.o.   MRN: [id number]   Subjective:    Patient ID: Catherine Ramsey, female    DOB: 1945/10/29, 70 y.o.   MRN: [id number]  HPI Catherine Ramsey is a pleasant 70 year old African American female known to Dr. Carlean Purl. She has history of insulin-dependent diabetes mellitus, hypertension, history of adenomatous colon polyps and has a diagnosis of achalasia type II. She comes in today with complaints of feeling "like my food is not digesting". She says everything she eats and drinks seems to sit in her chest for a long time and sometimes early in the mornings she will regurgitate up food that she had eaten the day previous. She had tried taking digestive enzymes for a period of time which seems to help a little bit but her problems have persisted. She has been having more frequent episodes of regurgitation happening almost daily at this point. She's been cooking her food very well done, very soft and cutting her food into very small pieces. She denies any heartburn and has not had any chest pain. She has gradually lost about 30 pounds over the past year and she says she fills up quickly. Her other complaint is some left-sided discomfort which she says is been present for years and is worse with activity.  Patient had undergone workup in summer of 2015 with colonoscopy which showed 2 diminutive sessile polyps which were removed and were hyperplastic. She was slated for 5 year interval follow-up given prior history of adenomatous polyps. EGD in 2015 showed a diffusely dilated esophagus with white plaques adherent and a stenosis at the GE junction. Biopsy showed Candida esophagitis and she was treated. She was then scheduled for esophageal manometry which confirmed a diagnosis of type II achalasia. Notes a she was to be referred to surgery the patient says that nothing was done after she had the manometry and she wasn't aware that she was supposed to see a Psychologist, sport and exercise. CT  imaging of the abdomen and pelvis had also been done in summer of 2015 for complaints of her lower abdominal pain in this showed severe degenerative changes in her left hip and sacroiliitis.  Review of Systems Pertinent positive and negative review of systems were noted in the above HPI section.  All other review of systems was otherwise negative.  Outpatient Encounter Prescriptions as of 12/25/2015  Medication Sig  . amLODipine (NORVASC) 10 MG tablet Take 10 mg by mouth daily.  Marland Kitchen atenolol (TENORMIN) 50 MG tablet Take 50 mg by mouth 2 (two) times daily.  . insulin NPH (HUMULIN N,NOVOLIN N) 100 UNIT/ML injection Inject 31 Units into the skin daily before breakfast. Patient states she is taking 50 units of Novolin N insulin in AM.  . insulin NPH (HUMULIN N,NOVOLIN N) 100 UNIT/ML injection Inject 11 Units into the skin at bedtime.  . potassium chloride (KLOR-CON) 8 MEQ tablet Take 8 mEq by mouth 2 (two) times daily.  . traMADol (ULTRAM) 50 MG tablet Take 50 mg by mouth every 6 (six) hours as needed. For pain  . [DISCONTINUED] fluconazole (DIFLUCAN) 100 MG tablet Take 200 mg on day one then 100 mg a day until gone (Patient not taking: Reported on 12/02/2014)  . [DISCONTINUED] hyoscyamine (LEVSIN SL) 0.125 MG SL tablet Place one tablet under tongue prior to each meal (Patient not taking: Reported on 12/25/2015)   No facility-administered encounter medications on file as of 12/25/2015.   No Known Allergies Patient Active Problem List  Diagnosis Date Noted  . Candida esophagitis ([place]) 03/26/2014  . DIABETES MELLITUS, TYPE II 05/08/2010  . Achalasia - Type II 05/08/2010  . ABDOMINAL PAIN, UPPER 05/08/2010  . Personal history of colonic polyps - adenomas, TV adenomas, serrated polyps 05/08/2010   Social History   Social History  . Marital Status: Married    Spouse Name: N/A  . Number of Children: N/A  . Years of Education: N/A   Occupational History  . Not on file.   Social History Main  Topics  . Smoking status: Never Smoker   . Smokeless tobacco: Never Used  . Alcohol Use: No  . Drug Use: No  . Sexual Activity: Not on file   Other Topics Concern  . Not on file   Social History Narrative    [name]'s family history is not on file.      Objective:    Filed Vitals:   12/25/15 0941  BP: 130/60  Pulse: 70    Physical Exam  well-developed older African-American female in no acute distress, pleasant blood pressure 130/60 pulse 70 height 5 foot 7 weight 211. HEENT; nontraumatic normocephalic EOMI PERRLA sclera anicteric, Cardiovascular; regular rate and rhythm with S1-S2 no murmur or gallop, Pulmonary; clear bilaterally, Abdomen ;soft nontender no palpable mass or hepatosplenomegaly, she is tender over the iliac crest on the left, Bowel sounds are present, Rectal; exam not done, Ext; no clubbing cyanosis or edema skin warm and dry, Neuropsych; mood and affect appropriate     Assessment & Plan:   #19 70 -year-old female with progressive symptoms of dysphagia early satiety and frequent regurgitation. Symptoms are consistent with her diagnosis of achalasia which has not been treated. #2 chronic left lower abdominal pain worse with activity I suspect this is actually secondary to degenerative arthritis in her hip and spine. #3 history of adenomatous colon polyps, last colonoscopy July 2015 with hyperplastic polyps only due for follow-up in 2020 #4 insulin-dependent diabetes mellitus  Plan; achalasia and management was discussed at length with the patient we discussed definitive management with laparoscopic Heller myotomy and less definitive option of Botox injections ordered pneumatic dilation. She is a reasonable surgical candidate and would like to proceed with surgical opinion. We'll arrange appointment with Dr. Zella Richer  In the antrum she will continue very soft diet and upright positioning for meals and for 2 hours postprandially. Also advised her to elevate the  head of her bed 60. She is advised that if she decides not to proceed with surgery to call back for appointment with Dr. Carlean Purl or myself so we can arrange endoscopic therapy for her achalasia.  Jamarie Joplin Genia Harold PA-C 12/25/2015   Cc: Leanna Battles, MD

## 2015-12-25 NOTE — Progress Notes (Signed)
Agree with Ms. Esterwood's assessment and plan. Carl E. Gessner, MD, FACG   

## 2015-12-25 NOTE — Telephone Encounter (Signed)
Faxed notes today to [facility]. Patient has an appointment with  Dr. Merryl Hacker for 01-05-2016 .  She needs to arrive at 3:00 PM for a 3:30 PM appointment. Patient informed.

## 2016-01-05 DIAGNOSIS — K220 Achalasia of cardia: Secondary | ICD-10-CM | POA: Diagnosis not present

## 2016-04-29 DIAGNOSIS — E784 Other hyperlipidemia: Secondary | ICD-10-CM | POA: Diagnosis not present

## 2016-04-29 DIAGNOSIS — E1151 Type 2 diabetes mellitus with diabetic peripheral angiopathy without gangrene: Secondary | ICD-10-CM | POA: Diagnosis not present

## 2016-04-29 DIAGNOSIS — Z0000 Encounter for general adult medical examination without abnormal findings: Secondary | ICD-10-CM | POA: Insufficient documentation

## 2016-04-29 DIAGNOSIS — I10 Essential (primary) hypertension: Secondary | ICD-10-CM | POA: Diagnosis not present

## 2016-05-06 DIAGNOSIS — E1151 Type 2 diabetes mellitus with diabetic peripheral angiopathy without gangrene: Secondary | ICD-10-CM | POA: Diagnosis not present

## 2016-05-06 DIAGNOSIS — E784 Other hyperlipidemia: Secondary | ICD-10-CM | POA: Diagnosis not present

## 2016-05-06 DIAGNOSIS — K220 Achalasia of cardia: Secondary | ICD-10-CM | POA: Diagnosis not present

## 2016-05-06 DIAGNOSIS — Z1389 Encounter for screening for other disorder: Secondary | ICD-10-CM | POA: Diagnosis not present

## 2016-05-06 DIAGNOSIS — K222 Esophageal obstruction: Secondary | ICD-10-CM | POA: Diagnosis not present

## 2016-05-06 DIAGNOSIS — I7389 Other specified peripheral vascular diseases: Secondary | ICD-10-CM | POA: Diagnosis not present

## 2016-05-06 DIAGNOSIS — Z0000 Encounter for general adult medical examination without abnormal findings: Secondary | ICD-10-CM | POA: Diagnosis not present

## 2016-05-06 DIAGNOSIS — M25552 Pain in left hip: Secondary | ICD-10-CM | POA: Diagnosis not present

## 2016-05-06 DIAGNOSIS — I10 Essential (primary) hypertension: Secondary | ICD-10-CM | POA: Diagnosis not present

## 2016-05-17 DIAGNOSIS — E119 Type 2 diabetes mellitus without complications: Secondary | ICD-10-CM | POA: Diagnosis not present

## 2016-06-05 ENCOUNTER — Encounter (HOSPITAL_COMMUNITY): Payer: Self-pay | Admitting: Emergency Medicine

## 2016-06-05 ENCOUNTER — Ambulatory Visit (HOSPITAL_COMMUNITY)
Admission: EM | Admit: 2016-06-05 | Discharge: 2016-06-05 | Disposition: A | Discharge status: Home / Self Care | Payer: Commercial Managed Care - HMO | Attending: Emergency Medicine | Admitting: Emergency Medicine

## 2016-06-05 DIAGNOSIS — M26621 Arthralgia of right temporomandibular joint: Secondary | ICD-10-CM | POA: Diagnosis not present

## 2016-06-05 MED ORDER — ACETAMINOPHEN 325 MG PO TABS
ORAL_TABLET | ORAL | Status: AC
  Filled 2016-06-05: qty 2

## 2016-06-05 MED ORDER — TRAMADOL HCL 50 MG PO TABS: 50.0000 mg | ORAL_TABLET | Freq: Four times a day (QID) | ORAL | 0 refills | Status: DC | PRN

## 2016-06-05 MED ORDER — ACETAMINOPHEN 325 MG PO TABS
650.0000 mg | ORAL_TABLET | Freq: Once | ORAL | Status: AC
  Administered 2016-06-05: 650 mg via ORAL

## 2016-06-05 MED ORDER — IBUPROFEN 800 MG PO TABS
ORAL_TABLET | ORAL | Status: AC
  Filled 2016-06-05: qty 1

## 2016-06-05 MED ORDER — IBUPROFEN 800 MG PO TABS
400.0000 mg | ORAL_TABLET | Freq: Once | ORAL | Status: AC
  Administered 2016-06-05: 400 mg via ORAL

## 2016-06-05 MED ORDER — NAPROXEN 250 MG PO TABS: 250.0000 mg | ORAL_TABLET | Freq: Two times a day (BID) | ORAL | 0 refills | Status: DC

## 2016-06-05 NOTE — ED Triage Notes (Signed)
Pain in front of left ear, pain runs along right jaw line.  Chewing, swallowing is painful.

## 2016-06-05 NOTE — ED Provider Notes (Signed)
CSN: [id number]     Arrival date & time 06/05/16  1450 History   First MD Initiated Contact with Patient 06/05/16 1701     Chief Complaint  Patient presents with  . Dental Pain   (Consider location/radiation/quality/duration/timing/severity/associated sxs/prior Treatment) 70 year old female developed pain and tenderness to the right jaw last evening. Pain is located in the right 3 irregular area and is greatest over the right TMJ. Pain also radiates anteriorly and inferiorly to the mandible. Pain is worse with chewing or other movements of the jaw. Denies earache or specific toothache. She states she wears dentures. Denies any known trauma.      Past Medical History:  Diagnosis Date  . Achalasia - Type II 05/08/2010   Qualifier: Diagnosis of  By: Carlean Purl MD, Tonna Boehringer E   . Candida esophagitis ([place]) 03/26/2014  . Diabetes mellitus   . Hypertension    Past Surgical History:  Procedure Laterality Date  . ABDOMINAL HYSTERECTOMY    . BREAST BIOPSY     left/benign  . ESOPHAGEAL MANOMETRY N/A 04/01/2014   Procedure: ESOPHAGEAL MANOMETRY (EM);  Surgeon: Gatha Mayer, MD;  Location: [facility];  Service: Endoscopy;  Laterality: N/A;  . TOTAL HIP ARTHROPLASTY     right   No family history on file. Social History  Substance Use Topics  . Smoking status: Never Smoker  . Smokeless tobacco: Never Used  . Alcohol use No   OB History    No data available     Review of Systems  Constitutional: Negative.   HENT: Negative for congestion, dental problem, ear discharge, ear pain, facial swelling, mouth sores, nosebleeds, postnasal drip, rhinorrhea, sinus pressure and trouble swallowing.   Eyes: Negative.   Respiratory: Negative.   Cardiovascular: Negative for chest pain.  Gastrointestinal: Negative.   Neurological: Negative for dizziness, speech difficulty and headaches.    Allergies  Review of patient's allergies indicates no known allergies.  Home Medications   Prior to  Admission medications   Medication Sig Start Date End Date Taking? Authorizing Provider  LOSARTAN POTASSIUM PO Take by mouth.   Yes Historical Provider, MD  PRAVASTATIN SODIUM PO Take by mouth.   Yes Historical Provider, MD  amLODipine (NORVASC) 10 MG tablet Take 10 mg by mouth daily.    Historical Provider, MD  atenolol (TENORMIN) 50 MG tablet Take 50 mg by mouth 2 (two) times daily.    Historical Provider, MD  insulin NPH (HUMULIN N,NOVOLIN N) 100 UNIT/ML injection Inject 31 Units into the skin daily before breakfast. Patient states she is taking 50 units of Novolin N insulin in AM.    Historical Provider, MD  insulin NPH (HUMULIN N,NOVOLIN N) 100 UNIT/ML injection Inject 11 Units into the skin at bedtime.    Historical Provider, MD  naproxen (NAPROSYN) 250 MG tablet Take 1 tablet (250 mg total) by mouth 2 (two) times daily with a meal. Prn jaw pain 06/05/16   Janne Napoleon, NP  potassium chloride (KLOR-CON) 8 MEQ tablet Take 8 mEq by mouth 2 (two) times daily.    Historical Provider, MD  traMADol (ULTRAM) 50 MG tablet Take 1 tablet (50 mg total) by mouth every 6 (six) hours as needed. 06/05/16   Janne Napoleon, NP   Meds Ordered and Administered this Visit   Medications  ibuprofen (ADVIL,MOTRIN) tablet 400 mg (not administered)  acetaminophen (TYLENOL) tablet 650 mg (not administered)    BP 175/66 (BP Location: Left Arm)   Pulse 64   Temp 99.6 F (37.6  C) (Oral)   Resp 22   SpO2 99%  No data found.   Physical Exam  Constitutional: She is oriented to person, place, and time. She appears well-developed and well-nourished. No distress.  HENT:  Head: Normocephalic and atraumatic.  Mouth/Throat: Oropharynx is clear and moist. No oropharyngeal exudate.  Bilateral TMs are normal. EACs are normal. No tenderness to the outer ear or EAC. There is tenderness to the preauricular space and just anterior along the TMJ or the greatest amount of tenderness is located. Having the patient opening close the  jaw increases pain. Intraoral exam reveals no dental tenderness. She does have natural that decayed and eroding teeth in the front lower jaw but dentures cover most of the other areas. No dental tenderness, no tenderness over the gumline or gingiva. No gingival swelling or erythema. No swelling, tenderness or discoloration to the buccal mucosa. There is substantial tenderness with palpation of the right pterygoid muscle.  Eyes: EOM are normal.  Neck: Normal range of motion. Neck supple.  Cardiovascular: Normal rate.   Pulmonary/Chest: Effort normal.  Musculoskeletal: Normal range of motion. She exhibits no edema or deformity.  Lymphadenopathy:    She has no cervical adenopathy.  Neurological: She is alert and oriented to person, place, and time.  Skin: Skin is warm and dry.  Nursing note and vitals reviewed.   Urgent Care Course   Clinical Course    Procedures (including critical care time)  Labs Review Labs Reviewed - No data to display  Imaging Review No results found.   Visual Acuity Review  Right Eye Distance:   Left Eye Distance:   Bilateral Distance:    Right Eye Near:   Left Eye Near:    Bilateral Near:         MDM   1. Arthralgia of right temporomandibular joint    First try applying cold compresses or ice to the right side of the jaw. If this helps continue doing so off and on. If this is not helping you may apply heat. Limit your chewing and no chewing such foods as steak or other foods that require much chewing. Mostly drink fluids and eat soft foods. Take the medication as directed. Take it with food so it does not upset her stomach. The tramadol may cause some drowsiness. Follow-up with your doctor next week. He may also need to see your orthodontist or dentist to check the feeding of your dentures as this sometimes can calls malalignment and pain originating from the jaw joint. Meds ordered this encounter  Medications  . LOSARTAN POTASSIUM PO    Sig:  Take by mouth.  Marland Kitchen PRAVASTATIN SODIUM PO    Sig: Take by mouth.  Marland Kitchen ibuprofen (ADVIL,MOTRIN) tablet 400 mg  . acetaminophen (TYLENOL) tablet 650 mg  . naproxen (NAPROSYN) 250 MG tablet    Sig: Take 1 tablet (250 mg total) by mouth 2 (two) times daily with a meal. Prn jaw pain    Dispense:  20 tablet    Refill:  0    Order Specific Question:   Supervising Provider    Answer:   Melony Overly [id number]  . traMADol (ULTRAM) 50 MG tablet    Sig: Take 1 tablet (50 mg total) by mouth every 6 (six) hours as needed.    Dispense:  15 tablet    Refill:  0    Order Specific Question:   Supervising Provider    Answer:   Melony Overly [id number]   Do  not take Ibuprofen with the pain prescriptions written today.    Janne Napoleon, NP 06/05/16 1728

## 2016-06-05 NOTE — Discharge Instructions (Signed)
First try applying cold compresses or ice to the right side of the jaw. If this helps continue doing so off and on. If this is not helping you may apply heat. Limit your chewing and no chewing such foods as steak or other foods that require much chewing. Mostly drink fluids and eat soft foods. Take the medication as directed. Take it with food so it does not upset her stomach. The tramadol may cause some drowsiness. Follow-up with your doctor next week. He may also need to see your orthodontist or dentist to check the feeding of your dentures as this sometimes can calls malalignment and pain originating from the jaw joint.

## 2016-09-06 DIAGNOSIS — Z1389 Encounter for screening for other disorder: Secondary | ICD-10-CM | POA: Diagnosis not present

## 2016-09-06 DIAGNOSIS — K220 Achalasia of cardia: Secondary | ICD-10-CM | POA: Diagnosis not present

## 2016-09-06 DIAGNOSIS — Z6832 Body mass index (BMI) 32.0-32.9, adult: Secondary | ICD-10-CM | POA: Diagnosis not present

## 2016-09-06 DIAGNOSIS — I10 Essential (primary) hypertension: Secondary | ICD-10-CM | POA: Diagnosis not present

## 2016-09-06 DIAGNOSIS — E1151 Type 2 diabetes mellitus with diabetic peripheral angiopathy without gangrene: Secondary | ICD-10-CM | POA: Diagnosis not present

## 2016-09-09 DIAGNOSIS — M79644 Pain in right finger(s): Secondary | ICD-10-CM | POA: Diagnosis not present

## 2016-09-09 DIAGNOSIS — M65341 Trigger finger, right ring finger: Secondary | ICD-10-CM | POA: Diagnosis not present

## 2016-09-13 DIAGNOSIS — M65341 Trigger finger, right ring finger: Secondary | ICD-10-CM | POA: Diagnosis not present

## 2016-09-13 DIAGNOSIS — M79644 Pain in right finger(s): Secondary | ICD-10-CM | POA: Diagnosis not present

## 2016-10-07 DIAGNOSIS — M65341 Trigger finger, right ring finger: Secondary | ICD-10-CM

## 2016-10-07 DIAGNOSIS — M79644 Pain in right finger(s): Secondary | ICD-10-CM | POA: Diagnosis not present

## 2016-10-07 HISTORY — DX: Trigger finger, right ring finger: M65.341

## 2016-11-15 ENCOUNTER — Ambulatory Visit: Payer: Self-pay | Admitting: General Surgery

## 2016-11-15 DIAGNOSIS — K220 Achalasia of cardia: Secondary | ICD-10-CM | POA: Diagnosis not present

## 2016-11-15 NOTE — H&P (Signed)
Catherine Ramsey 11/15/2016 10:52 AM Location: [facility] Patient #: [id number] DOB: [birth year] Married / Language: English / Race: Black or African American Female  History of Present Illness Catherine Hollingshead MD; 11/15/2016 11:09 AM) The patient is a 71 year old female.   Note:She presents today because she would like to schedule a laparoscopic Heller myotomy for achalasia. I saw her 10 months ago and recommended the surgery. She was interested at that time but decided she would try to make do without having the operation. Her dysphagia has worsened. She has lost weight. She has trouble keeping most solids down and regurgitates most solids. She is interested in proceeding with the operation.  Allergies (April Staton, CMA; 11/15/2016 10:52 AM) No Known Drug Allergies 01/05/2016  Medication History (April Staton, CMA; 11/15/2016 10:53 AM) AmLODIPine Besylate (10MG Tablet, Oral) Active. Atenolol (50MG Tablet, Oral) Active. Accu-Chek Aviva (In Vitro) Active. Accu-Chek Aviva Plus (In Vitro) Active. Accu-Chek Aviva Plus (w/Device Kit,) Active. HumaLOG (100UNIT/ML Solution, Subcutaneous) Active. Gabapentin (300MG Capsule, Oral) Active. NovoLOG FlexPen (100UNIT/ML Solution, Subcutaneous) Active. Losartan Potassium-HCTZ (100-25MG Tablet, Oral) Active. PriLOSEC (20MG Capsule DR, Oral) Active. Sure Comfort Pen Needles (31G X 8 MM Misc,) Active. Medications Reconciled    Vitals (April Staton CMA; 11/15/2016 10:53 AM) 11/15/2016 10:53 AM Weight: 188.25 lb Height: 67.5in Body Surface Area: 1.98 m Body Mass Index: 29.05 kg/m  Temp.: 98.44F(Oral)  Pulse: 83 (Regular)  BP: 150/80 (Sitting, Left Arm, Standard)      Physical Exam Catherine Hollingshead MD; 11/15/2016 11:11 AM)  The physical exam findings are as follows: Note:GENERAL APPEARANCE: Overweight female in NAD. Weight down approximately 20 pounds since last visit Pleasant and  cooperative.  EARS, NOSE, MOUTH THROAT: [place]/AT external ears: no lesions or deformities external nose: no lesions or deformities hearing: grossly normal lips: moist, no deformities EYES external: conjunctiva, lids, sclerae normal pupils: equal, round glasses: no  NECK: Supple, no obvious mass or thyroid mass/enlargement  CV ascultation: RRR, no murmur extremity edema: no carotid bruit: no  RESP auscultation: breath sounds equal and clear respiratory effort: normal  GASTROINTESTINAL abdomen: Soft, non-tender, non-distended, no masses liver and spleen: not enlarged. hernia: none present scar: none present  MUSCULOSKELETAL station and gait: normal digits/nails: no clubbing or cyanosis deformities: none instability: none  LYMPHATIC: No palpable cervical, supraclavicular adenopathy.  SKIN rash or lesion: Very dry skin on legs  NEUROLOGIC speech: normal  PSYCHIATRIC alertness and orientation: normal mood/affect/behavior: normal judgement and insight: normal    Assessment & Plan Catherine Hollingshead MD; 11/15/2016 11:13 AM)  ACHALASIA (K22.0) Impression: Her symptoms have progressed. She would like to proceed with the operation.  Plan: Laparoscopic Heller myotomy and Dor fundoplication. The procedure and risks of the operation were explained. Risks include but are not limited to bleeding, infection, wound healing problems, anesthesia, esophageal perforation, injury to the stomach/colon/spleen/liver, need for reoperation failure of operation to improve the symptoms, reflux disease, dysphagia to some foods. We talked about importance of eating certain types of foods and certain behavioral changes with respect to diet.  In the meantime, we'll put her on a liquid and pured-type diet.  Jackolyn Confer, M.D.

## 2016-12-01 NOTE — Patient Instructions (Addendum)
Catherine Ramsey  12/01/2016   Your procedure is scheduled on: 12/07/16  Report to [facility] Main  Entrance Take [place]  elevators to 3rd floor to  [place] at     1015AM.    Call this number if you have problems the morning of surgery [phone]    Remember: ONLY 1 PERSON MAY GO WITH YOU TO SHORT STAY TO GET  READY MORNING OF YOUR SURGERY.  Do not eat food or drink liquids :After Midnight.     Take these medicines the morning of surgery with A SIP OF WATER: amlodipine, atenelol,gabapentin                                You may not have any metal on your body including hair pins and              piercings  Do not wear jewelry, make-up, lotions, powders or perfumes, deodorant             Do not wear nail polish.  Do not shave  48 hours prior to surgery.     Do not bring valuables to the hospital. [place].  Contacts, dentures or bridgework may not be worn into surgery.  Leave suitcase in the car. After surgery it may be brought to your room.                 Please read over the following fact sheets you were given: _____________________________________________________________________           [facility] - Preparing for Surgery Before surgery, you can play an important role.  Because skin is not sterile, your skin needs to be as free of germs as possible.  You can reduce the number of germs on your skin by washing with CHG (chlorahexidine gluconate) soap before surgery.  CHG is an antiseptic cleaner which kills germs and bonds with the skin to continue killing germs even after washing. Please DO NOT use if you have an allergy to CHG or antibacterial soaps.  If your skin becomes reddened/irritated stop using the CHG and inform your nurse when you arrive at Short Stay. Do not shave (including legs and underarms) for at least 48 hours prior to the first CHG shower.  You may shave your face/neck. Please  follow these instructions carefully:  1.  Shower with CHG Soap the night before surgery and the  morning of Surgery.  2.  If you choose to wash your hair, wash your hair first as usual with your  normal  shampoo.  3.  After you shampoo, rinse your hair and body thoroughly to remove the  shampoo.                           4.  Use CHG as you would any other liquid soap.  You can apply chg directly  to the skin and wash                       Gently with a scrungie or clean washcloth.  5.  Apply the CHG Soap to your body ONLY FROM THE NECK DOWN.   Do not use  on face/ open                           Wound or open sores. Avoid contact with eyes, ears mouth and genitals (private parts).                       Wash face,  Genitals (private parts) with your normal soap.             6.  Wash thoroughly, paying special attention to the area where your surgery  will be performed.  7.  Thoroughly rinse your body with warm water from the neck down.  8.  DO NOT shower/wash with your normal soap after using and rinsing off  the CHG Soap.                9.  Pat yourself dry with a clean towel.            10.  Wear clean pajamas.            11.  Place clean sheets on your bed the night of your first shower and do not  sleep with pets. Day of Surgery : Do not apply any lotions/deodorants the morning of surgery.  Please wear clean clothes to the hospital/surgery center.  FAILURE TO FOLLOW THESE INSTRUCTIONS MAY RESULT IN THE CANCELLATION OF YOUR SURGERY PATIENT SIGNATURE_________________________________  NURSE SIGNATURE__________________________________  ________________________________________________________________________ How to Manage Your Diabetes Before and After Surgery  Why is it important to control my blood sugar before and after surgery? . Improving blood sugar levels before and after surgery helps healing and can limit problems. . A way of improving blood sugar control is eating a healthy diet  by: o  Eating less sugar and carbohydrates o  Increasing activity/exercise o  Talking with your doctor about reaching your blood sugar goals . High blood sugars (greater than 180 mg/dL) can raise your risk of infections and slow your recovery, so you will need to focus on controlling your diabetes during the weeks before surgery. . Make sure that the doctor who takes care of your diabetes knows about your planned surgery including the date and location.  How do I manage my blood sugar before surgery? . Check your blood sugar at least 4 times a day, starting 2 days before surgery, to make sure that the level is not too high or low. o Check your blood sugar the morning of your surgery when you wake up and every 2 hours until you get to the Short Stay unit. . If your blood sugar is less than 70 mg/dL, you will need to treat for low blood sugar: o Do not take insulin. o Treat a low blood sugar (less than 70 mg/dL) with  cup of clear juice (cranberry or apple), 4 glucose tablets, OR glucose gel. o Recheck blood sugar in 15 minutes after treatment (to make sure it is greater than 70 mg/dL). If your blood sugar is not greater than 70 mg/dL on recheck, call [phone] for further instructions. . Report your blood sugar to the short stay nurse when you get to Short Stay.  . If you are admitted to the hospital after surgery: o Your blood sugar will be checked by the staff and you will probably be given insulin after surgery (instead of oral diabetes medicines) to make sure you have good blood sugar levels. o The goal for blood sugar control after surgery  is 80-180 mg/dL.   WHAT DO I DO ABOUT MY DIABETES MEDICATION?  Marland Kitchen Do not take oral diabetes medicines (pills) the morning of surgery.  . THE NIGHT BEFORE SURGERY, take      no      insulin.       . THE MORNING OF SURGERY, take  10 units of       touje   insulin.   50% of normal dose .         If blood sugar greater than 220 take 1/2 of sliding  scale dose . The day of surgery, do not take other diabetes injectables, including Byetta (exenatide), Bydureon (exenatide ER), Victoza (liraglutide), or Trulicity (dulaglutide)      Patient Signature:  Date:   Nurse Signature:  Date:   Reviewed and Endorsed by [facility] Patient Education Committee, August 2015

## 2016-12-03 ENCOUNTER — Encounter (HOSPITAL_COMMUNITY): Payer: Self-pay

## 2016-12-03 ENCOUNTER — Encounter (HOSPITAL_COMMUNITY)
Admission: RE | Admit: 2016-12-03 | Discharge: 2016-12-03 | Disposition: A | Discharge status: Home / Self Care | Payer: Medicare HMO | Source: Ambulatory Visit | Attending: General Surgery | Admitting: General Surgery

## 2016-12-03 DIAGNOSIS — Z01810 Encounter for preprocedural cardiovascular examination: Secondary | ICD-10-CM | POA: Insufficient documentation

## 2016-12-03 DIAGNOSIS — Z01812 Encounter for preprocedural laboratory examination: Secondary | ICD-10-CM | POA: Insufficient documentation

## 2016-12-03 HISTORY — DX: Family history of other specified conditions: Z84.89

## 2016-12-03 LAB — COMPREHENSIVE METABOLIC PANEL
ALBUMIN: 3.5 g/dL (ref 3.5–5.0)
ALK PHOS: 89 U/L (ref 38–126)
ALT: 15 U/L (ref 14–54)
ANION GAP: 9 (ref 5–15)
AST: 21 U/L (ref 15–41)
BILIRUBIN TOTAL: 1 mg/dL (ref 0.3–1.2)
BUN: 10 mg/dL (ref 6–20)
CALCIUM: 8.6 mg/dL — AB (ref 8.9–10.3)
CO2: 28 mmol/L (ref 22–32)
CREATININE: 0.93 mg/dL (ref 0.44–1.00)
Chloride: 105 mmol/L (ref 101–111)
GFR calc Af Amer: >60 mL/min (ref >60)
GFR calc non Af Amer: >60 mL/min (ref >60)
GLUCOSE: 247 mg/dL — AB (ref 65–99)
Potassium: 3.3 mmol/L — ABNORMAL LOW (ref 3.5–5.1)
SODIUM: 142 mmol/L (ref 135–145)
TOTAL PROTEIN: 7 g/dL (ref 6.5–8.1)

## 2016-12-03 LAB — CBC WITH DIFFERENTIAL/PLATELET
BASOS PCT: 0 %
Basophils Absolute: 0 10*3/uL (ref 0.0–0.1)
Eosinophils Absolute: 0.3 10*3/uL (ref 0.0–0.7)
Eosinophils Relative: 3 %
HEMATOCRIT: 33.6 % — AB (ref 36.0–46.0)
HEMOGLOBIN: 11 g/dL — AB (ref 12.0–15.0)
Lymphocytes Relative: 38 %
Lymphs Abs: 2.9 10*3/uL (ref 0.7–4.0)
MCH: 27.6 pg (ref 26.0–34.0)
MCHC: 32.7 g/dL (ref 30.0–36.0)
MCV: 84.4 fL (ref 78.0–100.0)
MONOS PCT: 4 %
Monocytes Absolute: 0.3 10*3/uL (ref 0.1–1.0)
NEUTROS ABS: 4.2 10*3/uL (ref 1.7–7.7)
NEUTROS PCT: 55 %
Platelets: 193 10*3/uL (ref 150–400)
RBC: 3.98 MIL/uL (ref 3.87–5.11)
RDW: 13.3 % (ref 11.5–15.5)
WBC: 7.7 10*3/uL (ref 4.0–10.5)

## 2016-12-03 LAB — GLUCOSE, CAPILLARY: Glucose-Capillary: 210 mg/dL — ABNORMAL HIGH (ref 65–99)

## 2016-12-03 NOTE — Progress Notes (Signed)
cmp done 12/03/16 routed to Dr. Zella Richer via epic

## 2016-12-07 ENCOUNTER — Inpatient Hospital Stay (HOSPITAL_COMMUNITY): Payer: Medicare HMO | Admitting: Anesthesiology

## 2016-12-07 ENCOUNTER — Encounter (HOSPITAL_COMMUNITY)
Admission: RE | Disposition: A | Discharge status: Home / Self Care | Payer: Self-pay | Source: Ambulatory Visit | Attending: General Surgery

## 2016-12-07 ENCOUNTER — Inpatient Hospital Stay (HOSPITAL_COMMUNITY)
Admission: RE | Admit: 2016-12-07 | Discharge: 2016-12-08 | DRG: 328 | Disposition: A | Discharge status: Home / Self Care | Payer: Medicare HMO | Source: Ambulatory Visit | Attending: General Surgery | Admitting: General Surgery

## 2016-12-07 ENCOUNTER — Encounter (HOSPITAL_COMMUNITY): Payer: Self-pay | Admitting: *Deleted

## 2016-12-07 DIAGNOSIS — I10 Essential (primary) hypertension: Secondary | ICD-10-CM | POA: Diagnosis present

## 2016-12-07 DIAGNOSIS — Z79899 Other long term (current) drug therapy: Secondary | ICD-10-CM | POA: Diagnosis not present

## 2016-12-07 DIAGNOSIS — Z794 Long term (current) use of insulin: Secondary | ICD-10-CM | POA: Diagnosis not present

## 2016-12-07 DIAGNOSIS — K449 Diaphragmatic hernia without obstruction or gangrene: Secondary | ICD-10-CM | POA: Diagnosis not present

## 2016-12-07 DIAGNOSIS — E119 Type 2 diabetes mellitus without complications: Secondary | ICD-10-CM | POA: Diagnosis present

## 2016-12-07 DIAGNOSIS — R933 Abnormal findings on diagnostic imaging of other parts of digestive tract: Secondary | ICD-10-CM | POA: Diagnosis not present

## 2016-12-07 DIAGNOSIS — R634 Abnormal weight loss: Secondary | ICD-10-CM | POA: Diagnosis present

## 2016-12-07 DIAGNOSIS — K220 Achalasia of cardia: Principal | ICD-10-CM | POA: Diagnosis present

## 2016-12-07 DIAGNOSIS — B3781 Candidal esophagitis: Secondary | ICD-10-CM | POA: Diagnosis not present

## 2016-12-07 HISTORY — PX: HELLER MYOTOMY: SHX5259

## 2016-12-07 LAB — TYPE AND SCREEN
ABO/RH(D): B POS
Antibody Screen: NEGATIVE

## 2016-12-07 LAB — GLUCOSE, CAPILLARY
GLUCOSE-CAPILLARY: 144 mg/dL — AB (ref 65–99)
GLUCOSE-CAPILLARY: 189 mg/dL — AB (ref 65–99)
Glucose-Capillary: 156 mg/dL — ABNORMAL HIGH (ref 65–99)

## 2016-12-07 SURGERY — ESOPHAGOMYOTOMY, LAPAROSCOPIC, HELLER
Anesthesia: General

## 2016-12-07 MED ORDER — ROCURONIUM BROMIDE 10 MG/ML (PF) SYRINGE
PREFILLED_SYRINGE | INTRAVENOUS | Status: DC | PRN
  Administered 2016-12-07: 50 mg via INTRAVENOUS
  Administered 2016-12-07: 20 mg via INTRAVENOUS
  Administered 2016-12-07: 5 mg via INTRAVENOUS
  Administered 2016-12-07 (×2): 10 mg via INTRAVENOUS

## 2016-12-07 MED ORDER — MIDAZOLAM HCL 2 MG/2ML IJ SOLN
INTRAMUSCULAR | Status: AC
  Filled 2016-12-07: qty 2

## 2016-12-07 MED ORDER — LIDOCAINE 2% (20 MG/ML) 5 ML SYRINGE
INTRAMUSCULAR | Status: DC | PRN
  Administered 2016-12-07: 50 mg via INTRAVENOUS

## 2016-12-07 MED ORDER — ONDANSETRON HCL 4 MG/2ML IJ SOLN
INTRAMUSCULAR | Status: AC
  Filled 2016-12-07: qty 2

## 2016-12-07 MED ORDER — PROPOFOL 10 MG/ML IV BOLUS
INTRAVENOUS | Status: DC | PRN
  Administered 2016-12-07: 140 mg via INTRAVENOUS

## 2016-12-07 MED ORDER — ONDANSETRON HCL 4 MG/2ML IJ SOLN
INTRAMUSCULAR | Status: AC
Start: 2016-12-07 — End: 2016-12-07
  Filled 2016-12-07: qty 2

## 2016-12-07 MED ORDER — ROCURONIUM BROMIDE 50 MG/5ML IV SOSY
PREFILLED_SYRINGE | INTRAVENOUS | Status: AC
  Filled 2016-12-07: qty 5

## 2016-12-07 MED ORDER — BUPIVACAINE HCL (PF) 0.5 % IJ SOLN
INTRAMUSCULAR | Status: AC
  Filled 2016-12-07: qty 30

## 2016-12-07 MED ORDER — SUCCINYLCHOLINE CHLORIDE 200 MG/10ML IV SOSY
PREFILLED_SYRINGE | INTRAVENOUS | Status: DC | PRN
  Administered 2016-12-07: 200 mg via INTRAVENOUS

## 2016-12-07 MED ORDER — CEFAZOLIN SODIUM-DEXTROSE 2-4 GM/100ML-% IV SOLN
2.0000 g | INTRAVENOUS | Status: AC
  Administered 2016-12-07: 2 g via INTRAVENOUS
  Filled 2016-12-07: qty 100

## 2016-12-07 MED ORDER — ONDANSETRON HCL 4 MG/2ML IJ SOLN
4.0000 mg | INTRAMUSCULAR | Status: AC
  Administered 2016-12-07 – 2016-12-08 (×3): 4 mg via INTRAVENOUS
  Filled 2016-12-07 (×3): qty 2

## 2016-12-07 MED ORDER — CEFAZOLIN SODIUM-DEXTROSE 2-4 GM/100ML-% IV SOLN
2.0000 g | Freq: Three times a day (TID) | INTRAVENOUS | Status: AC
  Administered 2016-12-07: 22:00:00 2 g via INTRAVENOUS
  Filled 2016-12-07: qty 100

## 2016-12-07 MED ORDER — POTASSIUM CHLORIDE IN NACL 40-0.9 MEQ/L-% IV SOLN
INTRAVENOUS | Status: DC
  Administered 2016-12-07 – 2016-12-08 (×2): 100 mL/h via INTRAVENOUS
  Filled 2016-12-07 (×3): qty 1000

## 2016-12-07 MED ORDER — INSULIN ASPART 100 UNIT/ML ~~LOC~~ SOLN
0.0000 [IU] | SUBCUTANEOUS | Status: DC
  Administered 2016-12-07 – 2016-12-08 (×3): 3 [IU] via SUBCUTANEOUS
  Administered 2016-12-08: 5 [IU] via SUBCUTANEOUS
  Administered 2016-12-08: 2 [IU] via SUBCUTANEOUS

## 2016-12-07 MED ORDER — ATROPINE SULFATE 1 MG/10ML IJ SOSY
PREFILLED_SYRINGE | INTRAMUSCULAR | Status: AC
  Filled 2016-12-07: qty 10

## 2016-12-07 MED ORDER — SUGAMMADEX SODIUM 200 MG/2ML IV SOLN
INTRAVENOUS | Status: DC | PRN
  Administered 2016-12-07: 400 mg via INTRAVENOUS

## 2016-12-07 MED ORDER — HYDRALAZINE HCL 20 MG/ML IJ SOLN
20.0000 mg | INTRAMUSCULAR | Status: DC | PRN
  Administered 2016-12-08: 20 mg via INTRAVENOUS
  Filled 2016-12-07: qty 1

## 2016-12-07 MED ORDER — HYDROMORPHONE HCL 2 MG/ML IJ SOLN
INTRAMUSCULAR | Status: AC
  Filled 2016-12-07: qty 1

## 2016-12-07 MED ORDER — HYDROMORPHONE HCL 1 MG/ML IJ SOLN
INTRAMUSCULAR | Status: DC | PRN
  Administered 2016-12-07: .4 mg via INTRAVENOUS

## 2016-12-07 MED ORDER — HYDROMORPHONE HCL 1 MG/ML IJ SOLN: 0.2500 mg | INTRAMUSCULAR | Status: DC | PRN

## 2016-12-07 MED ORDER — LACTATED RINGERS IR SOLN
Status: DC | PRN
  Administered 2016-12-07: 1000 mL

## 2016-12-07 MED ORDER — PROPOFOL 10 MG/ML IV BOLUS
INTRAVENOUS | Status: AC
  Filled 2016-12-07: qty 20

## 2016-12-07 MED ORDER — DEXAMETHASONE SODIUM PHOSPHATE 10 MG/ML IJ SOLN
INTRAMUSCULAR | Status: DC | PRN
  Administered 2016-12-07: 4 mg via INTRAVENOUS

## 2016-12-07 MED ORDER — ATROPINE SULFATE 0.4 MG/ML IJ SOLN
INTRAMUSCULAR | Status: DC | PRN
  Administered 2016-12-07: 0.4 mg via INTRAVENOUS

## 2016-12-07 MED ORDER — LIDOCAINE 2% (20 MG/ML) 5 ML SYRINGE
INTRAMUSCULAR | Status: AC
  Filled 2016-12-07: qty 5

## 2016-12-07 MED ORDER — MIDAZOLAM HCL 2 MG/2ML IJ SOLN
INTRAMUSCULAR | Status: DC | PRN
  Administered 2016-12-07: 1 mg via INTRAVENOUS

## 2016-12-07 MED ORDER — PROMETHAZINE HCL 25 MG/ML IJ SOLN: 6.2500 mg | INTRAMUSCULAR | Status: DC | PRN

## 2016-12-07 MED ORDER — SODIUM CHLORIDE 0.9 % IJ SOLN
INTRAMUSCULAR | Status: AC
  Filled 2016-12-07: qty 10

## 2016-12-07 MED ORDER — LACTATED RINGERS IV SOLN
INTRAVENOUS | Status: DC
  Administered 2016-12-07 (×3): via INTRAVENOUS

## 2016-12-07 MED ORDER — PANTOPRAZOLE SODIUM 40 MG IV SOLR
40.0000 mg | Freq: Every day | INTRAVENOUS | Status: DC
  Administered 2016-12-07: 23:00:00 40 mg via INTRAVENOUS
  Filled 2016-12-07: qty 40

## 2016-12-07 MED ORDER — LACTATED RINGERS IV SOLN: INTRAVENOUS | Status: DC

## 2016-12-07 MED ORDER — ONDANSETRON HCL 4 MG/2ML IJ SOLN: 4.0000 mg | INTRAMUSCULAR | Status: DC | PRN

## 2016-12-07 MED ORDER — KETOROLAC TROMETHAMINE 30 MG/ML IJ SOLN: 30.0000 mg | Freq: Once | INTRAMUSCULAR | Status: DC | PRN

## 2016-12-07 MED ORDER — METOPROLOL TARTRATE 5 MG/5ML IV SOLN
5.0000 mg | Freq: Two times a day (BID) | INTRAVENOUS | Status: DC
  Administered 2016-12-07 – 2016-12-08 (×2): 5 mg via INTRAVENOUS
  Filled 2016-12-07 (×2): qty 5

## 2016-12-07 MED ORDER — SUGAMMADEX SODIUM 200 MG/2ML IV SOLN
INTRAVENOUS | Status: AC
  Filled 2016-12-07: qty 2

## 2016-12-07 MED ORDER — SUFENTANIL CITRATE 50 MCG/ML IV SOLN
INTRAVENOUS | Status: AC
  Filled 2016-12-07: qty 1

## 2016-12-07 MED ORDER — EPHEDRINE SULFATE-NACL 50-0.9 MG/10ML-% IV SOSY
PREFILLED_SYRINGE | INTRAVENOUS | Status: DC | PRN
  Administered 2016-12-07 (×2): 5 mg via INTRAVENOUS

## 2016-12-07 MED ORDER — 0.9 % SODIUM CHLORIDE (POUR BTL) OPTIME
TOPICAL | Status: DC | PRN
  Administered 2016-12-07: 1000 mL

## 2016-12-07 MED ORDER — MORPHINE SULFATE (PF) 4 MG/ML IV SOLN
2.0000 mg | INTRAVENOUS | Status: DC | PRN
  Administered 2016-12-07 – 2016-12-08 (×2): 2 mg via INTRAVENOUS
  Filled 2016-12-07 (×2): qty 1

## 2016-12-07 MED ORDER — ENOXAPARIN SODIUM 40 MG/0.4ML ~~LOC~~ SOLN
40.0000 mg | SUBCUTANEOUS | Status: DC
  Administered 2016-12-08: 40 mg via SUBCUTANEOUS
  Filled 2016-12-07: qty 0.4

## 2016-12-07 MED ORDER — DEXAMETHASONE SODIUM PHOSPHATE 10 MG/ML IJ SOLN
INTRAMUSCULAR | Status: AC
  Filled 2016-12-07: qty 1

## 2016-12-07 MED ORDER — ONDANSETRON 4 MG PO TBDP: 4.0000 mg | ORAL_TABLET | Freq: Four times a day (QID) | ORAL | Status: DC | PRN

## 2016-12-07 MED ORDER — BUPIVACAINE HCL (PF) 0.5 % IJ SOLN
INTRAMUSCULAR | Status: DC | PRN
  Administered 2016-12-07: 15 mL

## 2016-12-07 MED ORDER — SUFENTANIL CITRATE 50 MCG/ML IV SOLN
INTRAVENOUS | Status: DC | PRN
  Administered 2016-12-07: 5 ug via INTRAVENOUS
  Administered 2016-12-07: 10 ug via INTRAVENOUS
  Administered 2016-12-07 (×7): 5 ug via INTRAVENOUS

## 2016-12-07 MED ORDER — ONDANSETRON HCL 4 MG/2ML IJ SOLN
INTRAMUSCULAR | Status: DC | PRN
  Administered 2016-12-07: 4 mg via INTRAVENOUS

## 2016-12-07 SURGICAL SUPPLY — 56 items
APL SKNCLS STERI-STRIP NONHPOA (GAUZE/BANDAGES/DRESSINGS) ×1
APPLIER CLIP 5 13 M/L LIGAMAX5 (MISCELLANEOUS)
APPLIER CLIP ROT 10 11.4 M/L (STAPLE)
APR CLP MED LRG 11.4X10 (STAPLE)
APR CLP MED LRG 5 ANG JAW (MISCELLANEOUS)
BENZOIN TINCTURE PRP APPL 2/3 (GAUZE/BANDAGES/DRESSINGS) ×3 IMPLANT
CABLE HIGH FREQUENCY MONO STRZ (ELECTRODE) ×3 IMPLANT
CHLORAPREP W/TINT 26ML (MISCELLANEOUS) ×3 IMPLANT
CLIP APPLIE 5 13 M/L LIGAMAX5 (MISCELLANEOUS) IMPLANT
CLIP APPLIE ROT 10 11.4 M/L (STAPLE) IMPLANT
CLOSURE WOUND 1/2 X4 (GAUZE/BANDAGES/DRESSINGS) ×1
DECANTER SPIKE VIAL GLASS SM (MISCELLANEOUS) ×3 IMPLANT
DEVICE SUT QUICK LOAD TK 5 (STAPLE) ×6 IMPLANT
DEVICE SUT TI-KNOT TK 5X26 (MISCELLANEOUS) ×2 IMPLANT
DEVICE SUTURE ENDOST 10MM (ENDOMECHANICALS) ×3 IMPLANT
DEVICE TI KNOT TK5 (MISCELLANEOUS) ×1
DISSECTOR BLUNT TIP ENDO 5MM (MISCELLANEOUS) ×3 IMPLANT
DRAIN CHANNEL 19F RND (DRAIN) IMPLANT
DRAIN PENROSE 18X1/2 LTX STRL (DRAIN) ×3 IMPLANT
DRSG TEGADERM 2-3/8X2-3/4 SM (GAUZE/BANDAGES/DRESSINGS) ×2 IMPLANT
ELECT REM PT RETURN 15FT ADLT (MISCELLANEOUS) ×3 IMPLANT
EVACUATOR SILICONE 100CC (DRAIN) IMPLANT
FELT TEFLON 4 X1 (MISCELLANEOUS) IMPLANT
FILTER SMOKE EVAC LAPAROSHD (FILTER) ×3 IMPLANT
GAUZE SPONGE 2X2 8PLY STRL LF (GAUZE/BANDAGES/DRESSINGS) IMPLANT
GLOVE ECLIPSE 8.0 STRL XLNG CF (GLOVE) ×3 IMPLANT
GLOVE INDICATOR 8.0 STRL GRN (GLOVE) ×3 IMPLANT
GOWN STRL REUS W/TWL XL LVL3 (GOWN DISPOSABLE) ×16 IMPLANT
KIT BASIN OR (CUSTOM PROCEDURE TRAY) ×3 IMPLANT
PAD POSITIONING PINK XL (MISCELLANEOUS) ×1 IMPLANT
QUICK LOAD TK 5 (STAPLE) ×3
RELOAD ENDO STITCH (ENDOMECHANICALS) ×27 IMPLANT
RELOAD SUT TRIPLE-STITCH 2-0 (ENDOMECHANICALS) IMPLANT
SCISSORS LAP 5X35 DISP (ENDOMECHANICALS) ×3 IMPLANT
SET IRRIG TUBING LAPAROSCOPIC (IRRIGATION / IRRIGATOR) ×2 IMPLANT
SHEARS HARMONIC ACE PLUS 36CM (ENDOMECHANICALS) ×3 IMPLANT
SLEEVE XCEL OPT CAN 5 100 (ENDOMECHANICALS) ×11 IMPLANT
SPONGE DRAIN TRACH 4X4 STRL 2S (GAUZE/BANDAGES/DRESSINGS) IMPLANT
SPONGE GAUZE 2X2 STER 10/PKG (GAUZE/BANDAGES/DRESSINGS) ×2
STRIP CLOSURE SKIN 1/2X4 (GAUZE/BANDAGES/DRESSINGS) ×2 IMPLANT
SUT ETHILON 3 0 PS 1 (SUTURE) IMPLANT
SUT MNCRL AB 4-0 PS2 18 (SUTURE) ×3 IMPLANT
SUT PDS AB 4-0 SH 27 (SUTURE) ×2 IMPLANT
SUT SURGIDAC NAB ES-9 0 48 120 (SUTURE) ×15 IMPLANT
TIP INNERVISION DETACH 40FR (MISCELLANEOUS) IMPLANT
TIP INNERVISION DETACH 50FR (MISCELLANEOUS) IMPLANT
TIP INNERVISION DETACH 56FR (MISCELLANEOUS) IMPLANT
TIPS INNERVISION DETACH 40FR (MISCELLANEOUS)
TOWEL OR 17X26 10 PK STRL BLUE (TOWEL DISPOSABLE) ×3 IMPLANT
TOWEL OR NON WOVEN STRL DISP B (DISPOSABLE) ×3 IMPLANT
TRAY FOLEY BAG SILVER LF 14FR (CATHETERS) ×2 IMPLANT
TRAY LAPAROSCOPIC (CUSTOM PROCEDURE TRAY) ×3 IMPLANT
TROCAR BLADELESS OPT 5 100 (ENDOMECHANICALS) ×3 IMPLANT
TROCAR XCEL BLUNT TIP 100MML (ENDOMECHANICALS) IMPLANT
TROCAR XCEL NON-BLD 11X100MML (ENDOMECHANICALS) ×3 IMPLANT
TUBING INSUF HEATED (TUBING) ×3 IMPLANT

## 2016-12-07 NOTE — Anesthesia Preprocedure Evaluation (Signed)
Anesthesia Evaluation  Patient identified by MRN, date of birth, ID band Patient awake    Reviewed: Allergy & Precautions, NPO status , Patient's Chart, lab work & pertinent test results  Airway Mallampati: II  TM Distance: >3 FB Neck ROM: Full    Dental no notable dental hx.    Pulmonary neg pulmonary ROS,    Pulmonary exam normal breath sounds clear to auscultation       Cardiovascular hypertension, Normal cardiovascular exam Rhythm:Regular Rate:Normal     Neuro/Psych negative neurological ROS  negative psych ROS   GI/Hepatic negative GI ROS, Neg liver ROS,   Endo/Other  diabetes, Insulin Dependent  Renal/GU negative Renal ROS  negative genitourinary   Musculoskeletal negative musculoskeletal ROS (+)   Abdominal   Peds negative pediatric ROS (+)  Hematology negative hematology ROS (+)   Anesthesia Other Findings   Reproductive/Obstetrics negative OB ROS                             Anesthesia Physical Anesthesia Plan  ASA: III  Anesthesia Plan: General   Post-op Pain Management:    Induction: Intravenous and Rapid sequence  Airway Management Planned: Oral ETT  Additional Equipment:   Intra-op Plan:   Post-operative Plan: Extubation in OR  Informed Consent: I have reviewed the patients History and Physical, chart, labs and discussed the procedure including the risks, benefits and alternatives for the proposed anesthesia with the patient or authorized representative who has indicated his/her understanding and acceptance.   Dental advisory given  Plan Discussed with: CRNA and Surgeon  Anesthesia Plan Comments:         Anesthesia Quick Evaluation

## 2016-12-07 NOTE — Op Note (Signed)
12/07/2016  3:06 PM  PATIENT:  Catherine Ramsey, 71 y.o., female, MRN: [id number]  PREOP DIAGNOSIS:  achalasia  POSTOP DIAGNOSIS:   Achalasia, s/p Heller myotomy by Dr. Zella Richer  PROCEDURE:  Intraoperative esophagogastroscopy  SURGEON:   Alphonsa Overall, M.D.  ANESTHESIA:   General  INDICATIONS FOR PROCEDURE:  [name] is a 71 y.o. (DOB: 20-Oct-1945)  AA female whose primary care physician is Leanna Battles, MD who has had a Heller myotomy for achalasia by Dr. Brien Mates.  I am doing the upper endoscopy to document the myotomy and make sure that there is no leak.   The indications and risks of the endoscopy were explained to the patient.  The risks include, but are not limited to, perforation, bleeding, or injury to the bowel.  PROCEDURE:  A flexible Pentax endoscope was passed down the throat without difficulty.   I did the endoscopy both after the Heller myotomy and after the Dor fundoplication.  Findings include:   Esophagus:   Dilated with retained food stuff.  The food stuff was more soft mushy matter and not large particles.   GE junction at: 39 cm.  The myotomy appeared to extend at least 5 cm above the GE junction and 3 cm below the GE junction on to the anterior stomach.  The scope was advanced into the stomach without resistance.  Dr. Zella Richer flooded the upper abdomen with saline and there was not leak.   Stomach: Normal   Duodenum:   Not cannulated.  Alphonsa Overall, MD, [facility] Pager: [phone] Office phone:  [phone]

## 2016-12-07 NOTE — Op Note (Signed)
OPERATIVE NOTE-LAPAROSCOPIC HELLER MYOTOMY, DOR FUNDOPLICATION, INTRAOPERATIVE ENDOSCOPY  Preop Dx:  Achalasia  Postop Dx:  Same with hiatal hernia  Procedure:  Laparoscopic Heller myotomy, hiatal hernia repair, Dor fundoplication, intraoperative upper endoscopy  Surgeon:  Jackolyn Confer, M.D.  Asst:  Alphonsa Overall, M.D.  Anesthesia:  General  Estimated blood loss:  150 cc  Indication:  [name] is a 71 year old female with achalasia that is progressively worsening. She has weight loss. She now presents for the above procedure.   Procedure Detail:  She was brought to the operating room and placed supine on the operating table and a general anesthetic was given. A Foley catheter was inserted. An oral gastric tube was inserted. The abdominal wall was widely sterilely prepped and draped. A timeout was performed.  Local anesthetic was infiltrated in the infraumbilical region in the left subcostal region. She is placed in slight reverse Trendelenburg position. Using a 5 mm Optiview trocar and laparoscope, access was gained into the peritoneal cavity and a pneumoperitoneum was created. The area underneath the trochars visualized and there is no evidence of bleeding or organ injury.   She was then placed in more  reverse Trendelenburg position. A 5 mm trocar was placed just to the left of the umbilicus.  A 5 trocar was placed in the right upper quadrant. A second 5 mm trocar  was placed in the left upper quadrant. An 11 mm trocar was placed in the midepigastrium.  A 5 mm incision was made in the subxiphoid area and the self-retaining liver retractor was placed into the peritoneal cavity. This retracted the left lobe of liver anteriorly exposing the hiatus. A small to moderate hiatal hernia was noted that reduce spontaneously and without tension.  Using the harmonic scalpel, the gastrohepatic ligament was divided up to the level of the right crus.  The phrenoesophageal ligament was divided  exposing the anterior esophagus. I then mobilized the left crus and reduce part of the hernia sac. Using blunt dissection, a retroesophageal window was created. A Penrose drain was then placed through this window. I dissected the posterior hernia sac free from the crura.    Inferior retraction on the gastroesophageal junction was performed. Using a combination of blunt dissection, electrocautery, and the Harmonic scalpel longitudinal fibers of the GE junctioin and distal esophagus were divided followed by circular fibers exposing the mucosa and heading superiorly approximately 6 cm. A 180 myotomy was performed. The myotomy was then extended inferiorly across the gastroesophageal junction and then onto the stomach for 3 cm.  At this time, upper endoscopy was performed by Dr. Lucia Gaskins. The area of the myotomy was put under irrigation fluid and there was no evidence of mucosal leak.  The gastroesophageal junction was widely patent. There is a little bit of narrowing proximal to the proximal portion of the myotomy and I extended this another 1-2 cm.  Next, I repaired the hiatal hernia with 2 interrupted nonabsorbable 0 sutures.  Next, a Dor fundoplication was performed. Beginning on the left side, 2 nonabsorbable size 0 sutures were placed through the fundus of the stomach, the left crus, and the myotomy muscle fibers. A third suture was placed distal to these incorporating the fundus and the myotomy fibers.  The stomach was then placed anteriorly over the myotomy. 2 nonabsorbable size 0 sutures were placed to the fundus of the stomach, the myotomy fibers, and the right crus.  A third suture was placed distal to these incorporating the fundus and the myotomy fibers. A  suture was then placed between the anterior stomach and the apex of the hiatus. There was no tension on the fundoplication.  The liver retractor was removed. A four quadrant and central inspection demonstrated no evidence of bleeding or organ  injury. The subumbilical trocar was removed and the fascial defects closed under laparoscopic vision by tying down the pursestring suture. The remaining trocars were removed and the pneumoperitoneum released.  All skin incisions were closed with 4-0 Monocryl subcuticular stitches. Steri-Strips and sterile dressings were applied.  She tolerated the procedure well without any apparent complications and was taken to the recovery room in satisfactory condition.

## 2016-12-07 NOTE — H&P (View-Only) (Signed)
Catherine Ramsey 11/15/2016 10:52 AM Location: [facility] Patient #: [id number] DOB: [birth year] Married / Language: English / Race: Black or African American Female  History of Present Illness Odis Hollingshead MD; 11/15/2016 11:09 AM) The patient is a 71 year old female.   Note:She presents today because she would like to schedule a laparoscopic Heller myotomy for achalasia. I saw her 10 months ago and recommended the surgery. She was interested at that time but decided she would try to make do without having the operation. Her dysphagia has worsened. She has lost weight. She has trouble keeping most solids down and regurgitates most solids. She is interested in proceeding with the operation.  Allergies (April Staton, CMA; 11/15/2016 10:52 AM) No Known Drug Allergies 01/05/2016  Medication History (April Staton, CMA; 11/15/2016 10:53 AM) AmLODIPine Besylate (10MG Tablet, Oral) Active. Atenolol (50MG Tablet, Oral) Active. Accu-Chek Aviva (In Vitro) Active. Accu-Chek Aviva Plus (In Vitro) Active. Accu-Chek Aviva Plus (w/Device Kit,) Active. HumaLOG (100UNIT/ML Solution, Subcutaneous) Active. Gabapentin (300MG Capsule, Oral) Active. NovoLOG FlexPen (100UNIT/ML Solution, Subcutaneous) Active. Losartan Potassium-HCTZ (100-25MG Tablet, Oral) Active. PriLOSEC (20MG Capsule DR, Oral) Active. Sure Comfort Pen Needles (31G X 8 MM Misc,) Active. Medications Reconciled    Vitals (April Staton CMA; 11/15/2016 10:53 AM) 11/15/2016 10:53 AM Weight: 188.25 lb Height: 67.5in Body Surface Area: 1.98 m Body Mass Index: 29.05 kg/m  Temp.: 98.10F(Oral)  Pulse: 83 (Regular)  BP: 150/80 (Sitting, Left Arm, Standard)      Physical Exam Odis Hollingshead MD; 11/15/2016 11:11 AM)  The physical exam findings are as follows: Note:GENERAL APPEARANCE: Overweight female in NAD. Weight down approximately 20 pounds since last visit Pleasant and  cooperative.  EARS, NOSE, MOUTH THROAT: [place]/AT external ears: no lesions or deformities external nose: no lesions or deformities hearing: grossly normal lips: moist, no deformities EYES external: conjunctiva, lids, sclerae normal pupils: equal, round glasses: no  NECK: Supple, no obvious mass or thyroid mass/enlargement  CV ascultation: RRR, no murmur extremity edema: no carotid bruit: no  RESP auscultation: breath sounds equal and clear respiratory effort: normal  GASTROINTESTINAL abdomen: Soft, non-tender, non-distended, no masses liver and spleen: not enlarged. hernia: none present scar: none present  MUSCULOSKELETAL station and gait: normal digits/nails: no clubbing or cyanosis deformities: none instability: none  LYMPHATIC: No palpable cervical, supraclavicular adenopathy.  SKIN rash or lesion: Very dry skin on legs  NEUROLOGIC speech: normal  PSYCHIATRIC alertness and orientation: normal mood/affect/behavior: normal judgement and insight: normal    Assessment & Plan Odis Hollingshead MD; 11/15/2016 11:13 AM)  ACHALASIA (K22.0) Impression: Her symptoms have progressed. She would like to proceed with the operation.  Plan: Laparoscopic Heller myotomy and Dor fundoplication. The procedure and risks of the operation were explained. Risks include but are not limited to bleeding, infection, wound healing problems, anesthesia, esophageal perforation, injury to the stomach/colon/spleen/liver, need for reoperation failure of operation to improve the symptoms, reflux disease, dysphagia to some foods. We talked about importance of eating certain types of foods and certain behavioral changes with respect to diet.  In the meantime, we'll put her on a liquid and pured-type diet.  Jackolyn Confer, M.D.

## 2016-12-07 NOTE — Anesthesia Postprocedure Evaluation (Signed)
Anesthesia Post Note  Patient: Catherine Ramsey  Procedure(s) Performed: Procedure(s) (LRB): LAPAROSCOPIC HELLER MYOTOMY, DOR FUNDOPLICATION, HIATAL HERNIA REPAIR, INTRAOPERATIVE UPPER ENDOSCOPY (N/A)  Patient location during evaluation: PACU Anesthesia Type: General Level of consciousness: awake and alert Pain management: pain level controlled Vital Signs Assessment: post-procedure vital signs reviewed and stable Respiratory status: spontaneous breathing, nonlabored ventilation, respiratory function stable and patient connected to nasal cannula oxygen Cardiovascular status: blood pressure returned to baseline and stable Postop Assessment: no signs of nausea or vomiting Anesthetic complications: no       Last Vitals:  Vitals:   12/07/16 1600 12/07/16 1615  BP: (!) 192/95 (!) 188/89  Pulse: 63 64  Resp: 12 14  Temp:  36.4 C    Last Pain:  Vitals:   12/07/16 1615  TempSrc:   PainSc: 0-No pain                 Dreamer Carillo S

## 2016-12-07 NOTE — Anesthesia Procedure Notes (Signed)
Procedure Name: Intubation Date/Time: 12/07/2016 11:46 AM Performed by: Danley Danker L Patient Re-evaluated:Patient Re-evaluated prior to inductionOxygen Delivery Method: Circle system utilized Preoxygenation: Pre-oxygenation with 100% oxygen Intubation Type: IV induction, Rapid sequence and Cricoid Pressure applied Laryngoscope Size: Mac and 3 Grade View: Grade I Tube type: Oral Tube size: 7.5 mm Number of attempts: 1 Airway Equipment and Method: Stylet Placement Confirmation: ETT inserted through vocal cords under direct vision,  positive ETCO2 and breath sounds checked- equal and bilateral Secured at: 21 cm Tube secured with: Tape Dental Injury: Teeth and Oropharynx as per pre-operative assessment

## 2016-12-07 NOTE — Interval H&P Note (Signed)
History and Physical Interval Note:  12/07/2016 11:13 AM  Catherine Ramsey  has presented today for surgery, with the diagnosis of achalasia  The various methods of treatment have been discussed with the patient and family. After consideration of risks, benefits and other options for treatment, the patient has consented to  Procedure(s): [place] (N/A) ESOPHAGOGASTRODUODENOSCOPY (EGD)UPPER ENDOSCOPY (N/A) as a surgical intervention .  The patient's history has been reviewed, patient examined, no change in status, stable for surgery.  I have reviewed the patient's chart and labs.  Questions were answered to the patient's satisfaction.     Laiyla Slagel Lenna Sciara

## 2016-12-07 NOTE — Discharge Instructions (Addendum)
[facility] [place] Instructions  Stay on a Level 1 diet-"blendarized foods." No lifting push or pulling over 10 pounds until released to do so by MD. Do not drive until you are pain-free. Walk frequently. You may shower. Office visit in 2 weeks.  Please call and make this appointment if you do not already have an appointment. Call for high fever (>101.5), persistent vomiting, wound problems.   EATING AFTER YOUR ESOPHAGEAL SURGERY  After your esophageal surgery, you can expect some difficulty swallowing.  If food sticks when you eat, it is called "dysphagia".  This is due to swelling around your surgery site and will most likely resolve within a few weeks.  To help you through this temporary phase, we start you out on a pureed diet.  Your first meal in the hospital was clear liquids.  You should have been given a pureed diet by the time you left the hospital.  We ask patients to stay on a pureed diet for the first two weeks to avoid anything getting "stuck" near your recent surgery.  Don't be alarmed if your ability to swallow doesn't progress according to this plan.  Everyone is different and some take longer or shorter.  Use common sense.  If you are having trouble swallowing a particular food, then avoid it.  If food is sticking when you advance your diet, go back to the previous day or two.  In general some simple rules to follow are:  Maintain an upright position (as near 90 degrees as possible) whenever eating or drinking.  Take small bites - only 1/2 to 1 teaspoon at a time.  Eat slowly.  It may also help to eat only one food at a time.  Stop eating when you first start feeling full.  Do not overeat.  Avoid talking while eating.  Do not mix solid foods and liquids in the same mouthful and do not "wash foods down" with liquids, unless you have been instructed to do so by your surgeon.  Eat in a relaxed atmosphere, with no  distractions.  Following each meal, sit in an upright position (90 degree angle) for 60 to 90 minutes.  Avoid carbonated (bubbly) drinks.  If food does stick, don't panic.  Try to relax and let the food pass on its own.  Sipping strong hot black tea can also help.  If you have any questions please call our office at [phone].   LEVEL 1 PUREED FOODS:  1ST 2 WEEKS AFTER SURGERY Foods in this group are pureed or blenderized to a smooth, mashed potato-like consistency.  If necessary, the pureed foods can keep their shape with the addition of a thickening agent.  Meat should be pureed to a smooth pasty consistency.  Hot broth or gravy may be added to the pureed meat, approximately 1 oz. of liquid per 3 oz. serving of meat. CAUTION:  If any foods do not puree into a smooth consistency, it may make eating for swallowing more difficult.  For example, zucchini seeds sometimes do not blend well. Hot Foods Cold Foods  Pureed scrambled eggs and cheese Pureed cottage cheese  Baby cereals Thickened juices and nectars  Thinned cooked cereals (no lumps) Thickened milk or eggnog  Pureed [place] toast or pancakes Ensure  Mashed potatoes Ice cream  Pureed parsley, au gratin, scalloped potatoes, candied sweet potatoes Fruit or [place] ice, sherbet  Pureed buttered or alfredo noodles Plain yogurt  Pureed vegetables (no corn or peas)  Instant breakfast  Pureed soups and creamed soups Smooth pudding, mousse, custard  Pureed scalloped apples Whipped gelatin  Gravies Sugar, syrup, honey, jelly  Sauces, cheese, tomato, barbecue, white, creamed Cream  Any baby food Creamer  Alcohol in moderation (not beer or champagne) Margarine  Coffee or tea Mayonnaise   Ketchup, mustard   Apple sauce   SAMPLE MENU:  PUREED DIET Breakfast Lunch Dinner   Orange juice, 1/2 cup  Cream of wheat, 1/2 cup  Pineapple juice, 1/2 cup  Pureed [place], barley soup, 3/4 cup  Pureed Hawaiian chicken, 3 oz   Scrambled eggs,  mashed or blended with cheese, 1/2 cup  Tea or coffee, 1 cup   Whole milk, 1 cup   Non-dairy creamer, 2 Tbsp.  Mashed potatoes, 1/2 cup  Pureed cooled broccoli, 1/2 cup  Apple sauce, 1/2 cup  Coffee or tea  Mashed potatoes, 1/2 cup  Pureed spinach, 1/2 cup  Frozen yogurt, 1/2 cup  Tea or coffee    LEVEL 2 After your first 2 weeks, you can advance to a soft diet.  Keep on this diet until everything goes down easily. Hot Foods Cold Foods  White fish Cottage cheese  Stuffed fish Junior baby fruit  Baby food meals Semi thickened juices  Minced soft cooked, scrambled, poached eggs nectars  Souffle & omelets Ripe mashed bananas  Cooked cereals Canned fruit, pineapple sauce, milk  potatoes Milkshake  Buttered or Alfredo noodles Custard  Cooked cooled vegetable Puddings, including tapioca  Sherbet Yogurt  Vegetable soup or alphabet soup Fruit ice, [place] ice  Gravies Whipped gelatin  Sugar, syrup, honey, jelly Junior baby desserts  Sauces:  Cheese, creamed, barbecue, tomato, white Cream  Coffee or tea Margarine   SAMPLE MENU:  LEVEL 2 Breakfast Lunch Dinner   Orange juice, 1/2 cup  Oatmeal, 1/2 cup  Scrambled eggs with cheese, 1/2 cup  Decaffeinated tea, 1 cup  Whole milk, 1 cup  Non-dairy creamer, 2 Tbsp  Pineapple juice, 1/2 cup  Minced beef, 3 oz  Gravy, 2 Tbsp  Mashed potatoes, 1/2 cup  Minced fresh broccoli, 1/2 cup  Applesauce, 1/2 cup  Coffee, 1 cup  [place], barley soup, 3/4 cup  Minced Hawaiian chicken, 3 oz  Mashed potatoes, 1/2 cup  Cooked spinach, 1/2 cup  Frozen yogurt, 1/2 cup  Non-dairy creamer, 2 Tbsp    LEVEL 3 After all the foods in level 2 (soft diet) are passing through well you should advance up to the next level.  It is still important to cut these foods into small pieces and eat slowly. Hot Foods Cold Foods  Poultry Cottage cheese  Chopped Swedish meatballs Yogurt  Meat salads (ground or flaked meat) Milk  Flaked  fish (tuna) Milkshakes  Poached or scrambled eggs Soft, cold, dry cereal  Souffles and omelets Fruit juices or nectars  Cooked cereals Chopped canned fruit  Chopped [place] toast or pancakes Canned fruit cocktail  Noodles or pasta (no rice) Pudding, mousse, custard  Cooked vegetables (no frozen peas, corn, or mixed vegetables) Green salad  Canned small sweet peas Ice cream  Creamed soup or vegetable soup Fruit ice, [place] ice  Pureed vegetable soup or alphabet soup Non-dairy creamer  Ground scalloped apples Margarine  Gravies Mayonnaise  Sauces:  Cheese, creamed, barbecue, tomato, white Ketchup  Coffee or tea Mustard   SAMPLE MENU:  LEVEL 3 Breakfast Lunch Dinner   Orange juice, 1/2 cup  Oatmeal, 1/2 cup  Scrambled eggs with cheese, 1/2 cup  Decaffeinated  tea, 1 cup  Whole milk, 1 cup  Non-dairy creamer, 2 Tbsp  Ketchup, 1 Tbsp  Margarine, 1 tsp  Salt, 1/4 tsp  Sugar, 2 tsp  Pineapple juice, 1/2 cup  Ground beef, 3 oz  Gravy, 2 Tbsp  Mashed potatoes, 1/2 cup  Cooked spinach, 1/2 cup  Applesauce, 1/2 cup  Decaffeinated coffee  Whole milk  Non-dairy creamer, 2 Tbsp  Margarine, 1 tsp  Salt, 1/4 tsp  Pureed [place], barley soup, 3/4 cup  Barbecue chicken, 3 oz  Mashed potatoes, 1/2 cup  Ground fresh broccoli, 1/2 cup  Frozen yogurt, 1/2 cup  Decaffeinated tea, 1 cup  Non-dairy creamer, 2 Tbsp  Margarine, 1 tsp  Salt, 1/4 tsp  Sugar, 1 tsp    LEVEL 4:  REGULAR FOODS Foods in this group are soft, moist, regularly textured foods.  This level includes red meat and breads, which tend to be the hardest things to swallow.  Eat very slow, chew well and continue to avoid carbonated drinks. Hot Foods Cold Foods  Baked fish or skinned Soft cheeses - cottage cheese  Souffles and omelets Cream cheese  Eggs Yogurt  Stuffed shells Milk  Spaghetti with meat sauce Milkshakes  Cooked cereal Cold dry cereals (no nuts, dried fruit, coconut)  [place] toast  or pancakes Crackers  Buttered toast Fruit juices or nectars  Noodles or pasta (no rice) Canned fruit  Potatoes (all types) Ripe bananas  Soft, cooked vegetables (no corn, lima, or baked beans) Peeled, ripe, fresh fruit  Creamed soups or vegetable soup Cakes (no nuts, dried fruit, coconut)  Canned chicken noodle soup Plain doughnuts  Gravies Ice cream  Bacon dressing Pudding, mousse, custard  Sauces:  Cheese, creamed, barbecue, tomato, white Fruit ice, [place] ice, sherbet  Decaffeinated tea or coffee Whipped gelatin  Pork chops Regular gelatin   Canned fruited gelatin molds   Sugar, syrup, honey, jam, jelly   Cream   Non-dairy   Margarine   Oil   Mayonnaise   Ketchup   Mustard

## 2016-12-07 NOTE — Transfer of Care (Signed)
Immediate Anesthesia Transfer of Care Note  Patient: Catherine Ramsey  Procedure(s) Performed: Procedure(s): LAPAROSCOPIC HELLER MYOTOMY, DOR FUNDOPLICATION, HIATAL HERNIA REPAIR, INTRAOPERATIVE UPPER ENDOSCOPY (N/A)  Patient Location: PACU  Anesthesia Type:General  Level of Consciousness: awake and alert   Airway & Oxygen Therapy: Patient Spontanous Breathing and Patient connected to face mask oxygen  Post-op Assessment: Report given to RN and Post -op Vital signs reviewed and stable  Post vital signs: Reviewed and stable  Last Vitals:  Vitals:   12/07/16 1027 12/07/16 1522  BP: (!) 183/84   Pulse: (!) 52   Resp: 20 (P) 12  Temp: 37.3 C (P) 36.6 C    Last Pain:  Vitals:   12/07/16 1522  TempSrc:   PainSc: (P) 0-No pain      Patients Stated Pain Goal: (P) 4 (33/43/56 8616)  Complications: No apparent anesthesia complications

## 2016-12-08 ENCOUNTER — Inpatient Hospital Stay (HOSPITAL_COMMUNITY): Payer: Medicare HMO

## 2016-12-08 ENCOUNTER — Encounter (HOSPITAL_COMMUNITY): Payer: Self-pay | Admitting: General Surgery

## 2016-12-08 LAB — GLUCOSE, CAPILLARY
GLUCOSE-CAPILLARY: 170 mg/dL — AB (ref 65–99)
GLUCOSE-CAPILLARY: 143 mg/dL — AB (ref 65–99)
GLUCOSE-CAPILLARY: 168 mg/dL — AB (ref 65–99)
Glucose-Capillary: 113 mg/dL — ABNORMAL HIGH (ref 65–99)
Glucose-Capillary: 170 mg/dL — ABNORMAL HIGH (ref 65–99)
Glucose-Capillary: 201 mg/dL — ABNORMAL HIGH (ref 65–99)

## 2016-12-08 LAB — BASIC METABOLIC PANEL
Anion gap: 9 (ref 5–15)
BUN: 13 mg/dL (ref 6–20)
CO2: 26 mmol/L (ref 22–32)
CREATININE: 0.97 mg/dL (ref 0.44–1.00)
Calcium: 8.2 mg/dL — ABNORMAL LOW (ref 8.9–10.3)
Chloride: 105 mmol/L (ref 101–111)
GFR, EST NON AFRICAN AMERICAN: 58 mL/min — AB (ref >60)
Glucose, Bld: 214 mg/dL — ABNORMAL HIGH (ref 65–99)
Potassium: 3.9 mmol/L (ref 3.5–5.1)
SODIUM: 140 mmol/L (ref 135–145)

## 2016-12-08 MED ORDER — HYDROCODONE-HOMATROPINE 5-1.5 MG/5ML PO SYRP
5.0000 mL | ORAL_SOLUTION | ORAL | Status: DC | PRN
  Administered 2016-12-08 (×2): 5 mL via ORAL
  Filled 2016-12-08 (×2): qty 5

## 2016-12-08 MED ORDER — HYDROCODONE-HOMATROPINE 5-1.5 MG/5ML PO SYRP: 5.0000 mL | ORAL_SOLUTION | ORAL | 0 refills | Status: DC | PRN

## 2016-12-08 MED ORDER — IOPAMIDOL (ISOVUE-300) INJECTION 61%
INTRAVENOUS | Status: AC
  Filled 2016-12-08: qty 150

## 2016-12-08 MED ORDER — IOPAMIDOL (ISOVUE-300) INJECTION 61%
150.0000 mL | Freq: Once | INTRAVENOUS | Status: AC | PRN
  Administered 2016-12-08: 110 mL via ORAL

## 2016-12-08 MED ORDER — ATENOLOL 50 MG PO TABS
50.0000 mg | ORAL_TABLET | Freq: Two times a day (BID) | ORAL | Status: DC
  Administered 2016-12-08: 12:00:00 50 mg via ORAL
  Filled 2016-12-08: qty 1

## 2016-12-08 MED ORDER — AMLODIPINE BESYLATE 10 MG PO TABS
10.0000 mg | ORAL_TABLET | Freq: Every day | ORAL | Status: DC
  Administered 2016-12-08: 12:00:00 10 mg via ORAL
  Filled 2016-12-08: qty 1

## 2016-12-08 NOTE — Progress Notes (Signed)
UGI negative for leak.  Will start full liquid diet.

## 2016-12-08 NOTE — Progress Notes (Signed)
Inpatient Diabetes Program Recommendations  AACE/ADA: New Consensus Statement on Inpatient Glycemic Control (2015)  Target Ranges:  Prepandial:   less than 140 mg/dL      Peak postprandial:   less than 180 mg/dL (1-2 hours)      Critically ill patients:  140 - 180 mg/dL   Lab Results  Component Value Date   GLUCAP 170 (H) 12/08/2016    Review of Glycemic Control  Diabetes history: DM2 Outpatient Diabetes medications: Toujeo 20 units QD, Novolog 10 units QD Current orders for Inpatient glycemic control: Novolog 0-15 units Q4H.   Needs a portion of home basal insulin.  Inpatient Diabetes Program Recommendations:    Add Lantus 10 units Q24H.  Will follow while inpatient.  Thank you. Lorenda Peck, RD, LDN, CDE Inpatient Diabetes Coordinator [phone]

## 2016-12-08 NOTE — Progress Notes (Signed)
Pt is tolerating her diet with no complaints with swallowing.  Medicated for pain.  Foley was discontinued, MD said ok to go home before urinating.  Instructions were given, as well as a prescription.  Understanding was verbalized.

## 2016-12-08 NOTE — Progress Notes (Signed)
Assessment Active Problems:   Achalasia - Type II s/p laparoscopic Heller myotomy, hiatal hernia repair, Dor fundoplication 09/04/28-QTMAUQ overnight    DM-II:  Cbgs 170-201    HTN:  On IV Lopressor and prn Hydralazine        Plan:  Gastrograffin UGI this AM.  If no leak, will start full liquids.  Discharge diet and instructions discussed with her.   LOS: 1 day     1 Day Post-Op  Chief Complaint/Subjective: Upper abdominal soreness.  Operation discussed with her.  Family in room.  Objective: Vital signs in last 24 hours: Temp:  [97.6 F (36.4 C)-99.3 F (37.4 C)] 98.7 F (37.1 C) (05/09 0406) Pulse Rate:  [52-79] 65 (05/09 0406) Resp:  [12-20] 16 (05/09 0406) BP: (133-197)/(61-96) 133/61 (05/09 0406) SpO2:  [100 %] 100 % (05/09 0406) Weight:  [86.2 kg (190 lb)] 86.2 kg (190 lb) (05/08 1044) Last BM Date: 12/06/16  Intake/Output from previous day: 05/08 0701 - 05/09 0700 In: 3393.3 [P.O.:80; I.V.:3313.3] Out: 2425 [Urine:2375; Blood:50] Intake/Output this shift: No intake/output data recorded.  PE: General- In NAD.  Awake and alert. Abdomen-soft, dressings dry  Lab Results:  No results for input(s): WBC, HGB, HCT, PLT in the last 72 hours. BMET  Recent Labs  12/08/16 0440  NA 140  K 3.9  CL 105  CO2 26  GLUCOSE 214*  BUN 13  CREATININE 0.97  CALCIUM 8.2*   PT/INR No results for input(s): LABPROT, INR in the last 72 hours. Comprehensive Metabolic Panel:    Component Value Date/Time   NA 140 12/08/2016 0440   NA 142 12/03/2016 1300   K 3.9 12/08/2016 0440   K 3.3 (L) 12/03/2016 1300   CL 105 12/08/2016 0440   CL 105 12/03/2016 1300   CO2 26 12/08/2016 0440   CO2 28 12/03/2016 1300   BUN 13 12/08/2016 0440   BUN 10 12/03/2016 1300   CREATININE 0.97 12/08/2016 0440   CREATININE 0.93 12/03/2016 1300   GLUCOSE 214 (H) 12/08/2016 0440   GLUCOSE 247 (H) 12/03/2016 1300   CALCIUM 8.2 (L) 12/08/2016 0440   CALCIUM 8.6 (L) 12/03/2016 1300   AST 21  12/03/2016 1300   AST 16 09/13/2007 1035   ALT 15 12/03/2016 1300   ALT 20 09/13/2007 1035   ALKPHOS 89 12/03/2016 1300   ALKPHOS 88 09/13/2007 1035   BILITOT 1.0 12/03/2016 1300   BILITOT 0.8 09/13/2007 1035   PROT 7.0 12/03/2016 1300   PROT 6.7 09/13/2007 1035   ALBUMIN 3.5 12/03/2016 1300   ALBUMIN 3.3 (L) 09/13/2007 1035     Studies/Results: No results found.  Anti-infectives: Anti-infectives    Start     Dose/Rate Route Frequency Ordered Stop   12/07/16 1800  ceFAZolin (ANCEF) IVPB 2g/100 mL premix     2 g 200 mL/hr over 30 Minutes Intravenous Every 8 hours 12/07/16 1650 12/07/16 2233   12/07/16 1016  ceFAZolin (ANCEF) IVPB 2g/100 mL premix     2 g 200 mL/hr over 30 Minutes Intravenous On call to O.R. 12/07/16 1016 12/07/16 1158       Emonni Depasquale J 12/08/2016

## 2016-12-14 ENCOUNTER — Encounter (HOSPITAL_COMMUNITY): Payer: Self-pay | Admitting: General Surgery

## 2016-12-14 NOTE — Discharge Summary (Signed)
Physician Discharge Summary  Patient ID: Catherine Ramsey MRN: [id number] DOB/AGE: Mar 14, 1946 71 y.o.  Admit date: 12/07/2016 Discharge date: 12/08/2016  Admission Diagnoses:  Achalasia  Discharge Diagnoses:  Achalasia Hiatal Hernia Diabetes Mellitus HTN    Discharged Condition: good  Hospital Course: She underwent her operation 12/07/16 and tolerated it well.  UGI on POD #1 showed no leak and she was started on a full liquid diet which she tolerated well.  She was discharged later that day. Discharge instructions were given to her.   Treatments: Laparoscopic Heller myotomy, Dor fundoplication, hiatal hernia repair  Discharge Exam: Blood pressure (!) 149/70, pulse 69, temperature 98.7 F (37.1 C), temperature source Oral, resp. rate 16, height 5\' 7"  (1.702 m), weight 86.2 kg (190 lb), SpO2 96 %.   Disposition: 01-Home or Self Care   Allergies as of 12/08/2016   No Known Allergies     Medication List    STOP taking these medications   naproxen 250 MG tablet Commonly known as:  NAPROSYN   traMADol 50 MG tablet Commonly known as:  ULTRAM     TAKE these medications   amLODipine 10 MG tablet Commonly known as:  NORVASC Take 10 mg by mouth daily.   atenolol 50 MG tablet Commonly known as:  TENORMIN Take 50 mg by mouth 2 (two) times daily.   gabapentin 300 MG capsule Commonly known as:  NEURONTIN Take 300 mg by mouth daily as needed (pain).   HYDROcodone-homatropine 5-1.5 MG/5ML syrup Commonly known as:  HYCODAN Take 5 mLs by mouth every 4 (four) hours as needed for cough.   losartan-hydrochlorothiazide 100-25 MG tablet Commonly known as:  HYZAAR Take 1 tablet by mouth daily.   NOVOLOG FLEXPEN 100 UNIT/ML FlexPen Generic drug:  insulin aspart Inject 10 Units into the skin daily.   potassium chloride 8 MEQ tablet Commonly known as:  KLOR-CON Take 8 mEq by mouth 2 (two) times daily.   pravastatin 20 MG tablet Commonly known as:  PRAVACHOL Take 20 mg by mouth  daily.   TOUJEO SOLOSTAR 300 UNIT/ML Sopn Generic drug:  Insulin Glargine Inject 20 Units into the skin daily.        Signed: Odis Hollingshead 12/14/2016, 9:25 AM

## 2016-12-16 DIAGNOSIS — E1165 Type 2 diabetes mellitus with hyperglycemia: Secondary | ICD-10-CM | POA: Diagnosis not present

## 2016-12-16 DIAGNOSIS — I10 Essential (primary) hypertension: Secondary | ICD-10-CM | POA: Diagnosis not present

## 2017-01-03 NOTE — Addendum Note (Signed)
Addendum  created 01/03/17 1426 by Aveion Nguyen, MD   Sign clinical note    

## 2017-01-03 NOTE — Anesthesia Postprocedure Evaluation (Signed)
Anesthesia Post Note  Patient: Catherine Ramsey  Procedure(s) Performed: Procedure(s) (LRB): LAPAROSCOPIC HELLER MYOTOMY, DOR FUNDOPLICATION, HIATAL HERNIA REPAIR, INTRAOPERATIVE UPPER ENDOSCOPY (N/A)     Anesthesia Post Evaluation  Last Vitals:  Vitals:   12/08/16 1014 12/08/16 1453  BP: (!) 163/81 (!) 149/70  Pulse: 64 69  Resp: 16 16  Temp: 37.1 C 37.1 C    Last Pain:  Vitals:   12/08/16 1630  TempSrc:   PainSc: 6                  Jacen Carlini S

## 2017-01-06 DIAGNOSIS — I10 Essential (primary) hypertension: Secondary | ICD-10-CM | POA: Diagnosis not present

## 2017-01-06 DIAGNOSIS — E1165 Type 2 diabetes mellitus with hyperglycemia: Secondary | ICD-10-CM | POA: Diagnosis not present

## 2017-01-06 DIAGNOSIS — K222 Esophageal obstruction: Secondary | ICD-10-CM | POA: Diagnosis not present

## 2017-01-06 DIAGNOSIS — K219 Gastro-esophageal reflux disease without esophagitis: Secondary | ICD-10-CM | POA: Diagnosis not present

## 2017-01-06 DIAGNOSIS — K220 Achalasia of cardia: Secondary | ICD-10-CM | POA: Diagnosis not present

## 2017-01-06 DIAGNOSIS — Z6830 Body mass index (BMI) 30.0-30.9, adult: Secondary | ICD-10-CM | POA: Diagnosis not present

## 2017-04-21 DIAGNOSIS — Z6833 Body mass index (BMI) 33.0-33.9, adult: Secondary | ICD-10-CM | POA: Diagnosis not present

## 2017-04-21 DIAGNOSIS — M25552 Pain in left hip: Secondary | ICD-10-CM | POA: Diagnosis not present

## 2017-04-21 DIAGNOSIS — R109 Unspecified abdominal pain: Secondary | ICD-10-CM | POA: Diagnosis not present

## 2017-04-21 DIAGNOSIS — R600 Localized edema: Secondary | ICD-10-CM | POA: Insufficient documentation

## 2017-05-03 DIAGNOSIS — E7849 Other hyperlipidemia: Secondary | ICD-10-CM | POA: Diagnosis not present

## 2017-05-03 DIAGNOSIS — R82998 Other abnormal findings in urine: Secondary | ICD-10-CM | POA: Diagnosis not present

## 2017-05-03 DIAGNOSIS — Z0000 Encounter for general adult medical examination without abnormal findings: Secondary | ICD-10-CM | POA: Diagnosis not present

## 2017-05-03 DIAGNOSIS — E1165 Type 2 diabetes mellitus with hyperglycemia: Secondary | ICD-10-CM | POA: Diagnosis not present

## 2017-05-10 DIAGNOSIS — M545 Low back pain: Secondary | ICD-10-CM | POA: Diagnosis not present

## 2017-05-10 DIAGNOSIS — Z23 Encounter for immunization: Secondary | ICD-10-CM | POA: Diagnosis not present

## 2017-05-10 DIAGNOSIS — E1151 Type 2 diabetes mellitus with diabetic peripheral angiopathy without gangrene: Secondary | ICD-10-CM | POA: Diagnosis not present

## 2017-05-10 DIAGNOSIS — Z0000 Encounter for general adult medical examination without abnormal findings: Secondary | ICD-10-CM | POA: Diagnosis not present

## 2017-05-10 DIAGNOSIS — I7389 Other specified peripheral vascular diseases: Secondary | ICD-10-CM | POA: Diagnosis not present

## 2017-05-10 DIAGNOSIS — I10 Essential (primary) hypertension: Secondary | ICD-10-CM | POA: Diagnosis not present

## 2017-05-10 DIAGNOSIS — Z6833 Body mass index (BMI) 33.0-33.9, adult: Secondary | ICD-10-CM | POA: Diagnosis not present

## 2017-05-10 DIAGNOSIS — E7849 Other hyperlipidemia: Secondary | ICD-10-CM | POA: Diagnosis not present

## 2017-05-17 DIAGNOSIS — Z1212 Encounter for screening for malignant neoplasm of rectum: Secondary | ICD-10-CM | POA: Diagnosis not present

## 2017-05-17 DIAGNOSIS — E1165 Type 2 diabetes mellitus with hyperglycemia: Secondary | ICD-10-CM | POA: Diagnosis not present

## 2017-06-15 ENCOUNTER — Ambulatory Visit: Payer: Medicare HMO | Admitting: Podiatry

## 2017-06-15 ENCOUNTER — Encounter: Payer: Self-pay | Admitting: Podiatry

## 2017-06-15 DIAGNOSIS — E1151 Type 2 diabetes mellitus with diabetic peripheral angiopathy without gangrene: Secondary | ICD-10-CM

## 2017-06-15 DIAGNOSIS — E1142 Type 2 diabetes mellitus with diabetic polyneuropathy: Secondary | ICD-10-CM

## 2017-06-15 DIAGNOSIS — B351 Tinea unguium: Secondary | ICD-10-CM | POA: Diagnosis not present

## 2017-06-15 DIAGNOSIS — K220 Achalasia of cardia: Secondary | ICD-10-CM | POA: Diagnosis not present

## 2017-06-26 NOTE — Progress Notes (Signed)
  Subjective:  Patient ID: Catherine Ramsey, female    DOB: 12/02/1945,  MRN: [id number]  Chief Complaint  Patient presents with  . Nail Problem    i am diabetic and i need my feet checked and my toenails trimmed and would like to get diabetic shoes   71 y.o. female returns for diabetic foot care. Last A1cwas 7.0. Reports numbness and tingling in their feet. Reports cramping in legs and thighs.  Objective:  There were no vitals filed for this visit. General AA&O x3. Normal mood and affect.  Vascular Dorsalis pedis pulses present 1+ bilaterally  Posterior tibial pulses absent bilaterally  Capillary refill normal to all digits. Pedal hair growth diminished.  Neurologic Epicritic sensation present bilaterally. Protective sensation with 5.07 monofilament  present bilaterally. Vibratory sensation absent bilaterally.  Dermatologic No open lesions. Interspaces clear of maceration.  Normal skin temperature and turgor. Hyperkeratotic lesions: None bilaterally. Nails: brittle, discoloration yellow, thickening  Orthopedic: No history of amputation. MMT 5/5 in dorsiflexion, plantarflexion, inversion, and eversion. Normal lower extremity joint ROM without pain or crepitus.   Assessment & Plan:  Patient was evaluated and treated and all questions answered.  Diabetes with PAD/DPN, Onychomycosis -Educated on diabetic footcare. Diabetic risk level 1 -At risk foot care provided as below.  Procedure: Nail Debridement Rationale: Patient meets criteria for routine foot care due to Class B findings Type of Debridement: manual, sharp debridement. Instrumentation: Nail nipper, rotary burr. Number of Nails: 10  Return in about 3 months (around 09/15/2017) for Diabetic Foot Care.

## 2017-08-03 ENCOUNTER — Ambulatory Visit: Payer: Medicare HMO

## 2017-08-08 ENCOUNTER — Telehealth: Payer: Self-pay | Admitting: Podiatry

## 2017-08-08 NOTE — Telephone Encounter (Signed)
I told pt that since she was still having pain and was diabetic she should come in to be evaluated. Pt states understanding and is transferred to schedulers.

## 2017-08-08 NOTE — Telephone Encounter (Signed)
I came in the other day and got measured/fitted for diabetic shoes. My problem is that I stumped my 5th right toe and I had stumped it one time before. It is giving me a fit and I don't know if it is fractured. If someone could tell me what to do or do I need to come in to have it seen. You can reach me at [phone]. Thank you.

## 2017-08-11 ENCOUNTER — Ambulatory Visit: Payer: Medicare HMO | Admitting: Podiatry

## 2017-08-11 ENCOUNTER — Ambulatory Visit (INDEPENDENT_AMBULATORY_CARE_PROVIDER_SITE_OTHER): Payer: Medicare HMO

## 2017-08-11 DIAGNOSIS — M79674 Pain in right toe(s): Secondary | ICD-10-CM

## 2017-08-11 DIAGNOSIS — M779 Enthesopathy, unspecified: Secondary | ICD-10-CM | POA: Diagnosis not present

## 2017-08-11 DIAGNOSIS — E1142 Type 2 diabetes mellitus with diabetic polyneuropathy: Secondary | ICD-10-CM

## 2017-08-11 DIAGNOSIS — M2041 Other hammer toe(s) (acquired), right foot: Secondary | ICD-10-CM

## 2017-08-18 ENCOUNTER — Other Ambulatory Visit: Payer: Self-pay | Admitting: Podiatry

## 2017-08-18 DIAGNOSIS — E1142 Type 2 diabetes mellitus with diabetic polyneuropathy: Secondary | ICD-10-CM

## 2017-08-25 ENCOUNTER — Ambulatory Visit (INDEPENDENT_AMBULATORY_CARE_PROVIDER_SITE_OTHER): Payer: Medicare HMO | Admitting: Podiatry

## 2017-08-25 DIAGNOSIS — M2041 Other hammer toe(s) (acquired), right foot: Secondary | ICD-10-CM | POA: Diagnosis not present

## 2017-08-25 MED ORDER — CEPHALEXIN 500 MG PO CAPS
500.0000 mg | ORAL_CAPSULE | Freq: Two times a day (BID) | ORAL | 0 refills | Status: DC
Start: 2017-08-25 — End: 2018-08-17

## 2017-09-08 ENCOUNTER — Ambulatory Visit: Payer: Medicare HMO | Admitting: Podiatry

## 2017-09-08 ENCOUNTER — Ambulatory Visit (INDEPENDENT_AMBULATORY_CARE_PROVIDER_SITE_OTHER): Payer: Medicare HMO | Admitting: Orthotics

## 2017-09-08 ENCOUNTER — Encounter: Payer: Self-pay | Admitting: Podiatry

## 2017-09-08 DIAGNOSIS — E1151 Type 2 diabetes mellitus with diabetic peripheral angiopathy without gangrene: Secondary | ICD-10-CM

## 2017-09-08 DIAGNOSIS — M2042 Other hammer toe(s) (acquired), left foot: Secondary | ICD-10-CM

## 2017-09-08 DIAGNOSIS — E1142 Type 2 diabetes mellitus with diabetic polyneuropathy: Secondary | ICD-10-CM

## 2017-09-08 DIAGNOSIS — B351 Tinea unguium: Secondary | ICD-10-CM

## 2017-09-08 DIAGNOSIS — M2040 Other hammer toe(s) (acquired), unspecified foot: Secondary | ICD-10-CM

## 2017-09-08 DIAGNOSIS — M79674 Pain in right toe(s): Secondary | ICD-10-CM | POA: Diagnosis not present

## 2017-09-08 NOTE — Progress Notes (Signed)

## 2017-09-15 ENCOUNTER — Ambulatory Visit: Payer: Medicare HMO | Admitting: Podiatry

## 2017-09-19 NOTE — Progress Notes (Signed)
  Subjective:  Patient ID: Catherine Ramsey, female    DOB: 05-23-1946,  MRN: [id number]  Chief Complaint  Patient presents with  . Diabetes    [place]/Diabetic    72 y.o. female returns for diabetic foot care.  Did not check sugars this a.m.  Picking up her diabetic shoes today. Objective:   General AA&O x3. Normal mood and affect.  Vascular Dorsalis pedis pulses present 1+ bilaterally  Posterior tibial pulses absent bilaterally  Capillary refill normal to all digits. Pedal hair growth normal.  Neurologic Epicritic sensation present bilaterally. Protective sensation with 5.07 monofilament  present bilaterally. Vibratory sensation present bilaterally.  Dermatologic No open lesions. Interspaces clear of maceration.  Normal skin temperature and turgor. Hyperkeratotic lesions: None bilaterally. Nails: brittle, onychomycosis, thickening, elongation  Orthopedic: No history of amputation. MMT 5/5 in dorsiflexion, plantarflexion, inversion, and eversion. Normal lower extremity joint ROM without pain or crepitus.  Left second hammertoe with pain to palpation HAV deformity bilateral hammertoe deformity bilateral   Assessment & Plan:  Patient was evaluated and treated and all questions answered.  Left second toe hammertoe deformity  -Discussed continued surgical versus conservative therapy with patient.  Hold off surgical intervention at this time  Diabetes with PAD/DPN Onychomycosis -Educated on diabetic footcare. Diabetic risk level 1 -At risk foot care provided as below.  Procedure: Nail Debridement Rationale: Patient meets criteria for routine foot care due to PAD Type of Debridement: manual, sharp debridement. Instrumentation: Nail nipper, rotary burr. Number of Nails: 10  Return in about 6 weeks (around 10/20/2017) for Diabetic Foot Care, Hammertoe L 2nd Toe.

## 2017-09-27 NOTE — Progress Notes (Signed)
  Subjective:  Patient ID: Catherine Ramsey, female    DOB: [birth year],  MRN: [id number]  Chief Complaint  Patient presents with  . Toe Pain    right 5th toe acute x 10 days - unable to sleep at night   72 y.o. female returns for the above complaint.  Reports pain to the right fifth toe times 10 days.  States that the pain is somewhat better but she cannot sleep.  States that she feels okay when she is up and walking around but when she lies down it starts throbbing.  Objective:  There were no vitals filed for this visit. General AA&O x3. Normal mood and affect.  Vascular Pedal pulses palpable.  Neurologic Epicritic sensation grossly intact.  Dermatologic No open lesions. Skin normal texture and turgor.  Orthopedic: Fifth toe adductovarus right foot deformity pain to palpation   Assessment & Plan:  Patient was evaluated and treated and all questions answered.  Hammertoe right foot -Educated on padding and shoe gear changes to prevent recurrence. -Discussed surgical therapy with patient patient wishes to hold off  Return in about 2 months (around 10/09/2017) for Hammertoe f/u.

## 2017-09-27 NOTE — Progress Notes (Signed)
  Subjective:  Patient ID: Catherine Ramsey, female    DOB: 09-05-1945,  MRN: [id number]  Chief Complaint  Patient presents with  . Ingrown Toenail    toe is swollen/with pus  . Diabetes    Type 2 w/PAD   72 y.o. female returns for the above complaint.  States that the right fifth toe is very sore states that she is soaked it noticed that the nail softened and pus came out from underneath the nail.  She states she cannot stand to touch it.  Objective:  There were no vitals filed for this visit. General AA&O x3. Normal mood and affect.  Vascular Pedal pulses palpable.  Neurologic Epicritic sensation grossly intact.  Dermatologic Slight purulence expressible underneath the right fifth toenail.  Orthopedic: No pain to palpation either foot.   Assessment & Plan:  Patient was evaluated and treated and all questions answered.  Abscess of toe -Slight amount of purulence expressed today.  Area cleansed.  Dressed with dry sterile dressing -Rx Keflex -Discussed soaking the digit.  Return in about 2 weeks (around 09/08/2017) for Hammertoe.

## 2017-10-20 ENCOUNTER — Ambulatory Visit: Payer: Medicare HMO | Admitting: Podiatry

## 2017-10-27 ENCOUNTER — Ambulatory Visit: Payer: Medicare HMO | Admitting: Podiatry

## 2017-10-27 DIAGNOSIS — L02611 Cutaneous abscess of right foot: Secondary | ICD-10-CM | POA: Diagnosis not present

## 2017-10-27 DIAGNOSIS — L03031 Cellulitis of right toe: Secondary | ICD-10-CM

## 2017-10-27 NOTE — Progress Notes (Signed)
  Subjective:  Patient ID: Catherine Ramsey, female    DOB: Aug 12, 1945,  MRN: [id number]  Chief Complaint  Patient presents with  . Toe Pain    right 5th toe very painful the last 2 weeks - especially at night - can't stand covers to touch it   72 y.o. female returns for the above complaint.  Reports the right fifth toe is very painful the last 2 weeks.  Worse at night.  States that she can even take the covers touching the toe.  Has been using over-the-counter medications without relief.  Objective:  There were no vitals filed for this visit. General AA&O x3. Normal mood and affect.  Vascular Pedal pulses palpable.  Neurologic Epicritic sensation grossly intact.  Dermatologic  subungual abscess right fifth toe with purulent drainage pain to palpation about the toe nail  Orthopedic: Pain palpation right fifth toe   Assessment & Plan:  Patient was evaluated and treated and all questions answered.  Local abscess right fifth toe  -I&D performed as below. Nail completely removed. -Discussed daily dressing consisting of antibiotic ointment and dry sterile dressing -Educated on postop soaking  Procedure: I&D abscess right fifth toe Anesthesia: Lidocaine 1% plain; 1.52mL and Marcaine 0.5% plain; 1.62mL Instrumentation: 312 blade, tissue nipper following Technique: Anesthesia and sterile skin prep with Betadine, the nail and hyperkeratotic tissue removed with a tissue nipper.  All purulence was expressed.  The area was thoroughly cleansed and dressed with antibiotic cream and dry sterile dressing. Dressing: Dry, sterile, compression dressing. Disposition: Patient tolerated procedure well. Patient to return in 2 weeks educated on postop soaking for follow-up.   Return in about 2 weeks (around 11/10/2017) for Abscess f/u.

## 2017-11-10 ENCOUNTER — Encounter: Payer: Self-pay | Admitting: Podiatry

## 2017-11-10 ENCOUNTER — Ambulatory Visit: Payer: Medicare HMO | Admitting: Podiatry

## 2017-11-10 DIAGNOSIS — L03031 Cellulitis of right toe: Secondary | ICD-10-CM | POA: Diagnosis not present

## 2017-11-29 NOTE — Progress Notes (Signed)
  Subjective:  Patient ID: Catherine Ramsey, female    DOB: 07/05/1946,  MRN: [id number]  Chief Complaint  Patient presents with  . paronychia    F/U R 5th toe Pt. stated," it's doing much better, no pain at all." Tx: epsom salt and antibiotic ointment   72 y.o. female returns for the above complaint.  States that there is doing much better.  Denies pain.  Has been using Epsom salts soaks and antibiotic ointment.  Objective:  There were no vitals filed for this visit. General AA&O x3. Normal mood and affect.  Vascular Pedal pulses palpable.  Neurologic Epicritic sensation grossly intact.  Dermatologic Roght fifth toe site healed without drainage erythema warmth pain to palpation  Orthopedic: Pain palpation right fifth toe   Assessment & Plan:  Patient was evaluated and treated and all questions answered.  Local abscess right fifth toe  -Resolved. -Advised to continue soaking PRN. -Monitor for signs of recurrence.  Return in about 1 month (around 12/08/2017).

## 2017-12-08 ENCOUNTER — Ambulatory Visit: Payer: Medicare HMO | Admitting: Podiatry

## 2017-12-08 ENCOUNTER — Other Ambulatory Visit: Payer: Self-pay

## 2018-03-06 DIAGNOSIS — E1151 Type 2 diabetes mellitus with diabetic peripheral angiopathy without gangrene: Secondary | ICD-10-CM | POA: Diagnosis not present

## 2018-03-22 DIAGNOSIS — M25552 Pain in left hip: Secondary | ICD-10-CM | POA: Diagnosis not present

## 2018-03-22 DIAGNOSIS — Z6834 Body mass index (BMI) 34.0-34.9, adult: Secondary | ICD-10-CM | POA: Diagnosis not present

## 2018-04-26 ENCOUNTER — Other Ambulatory Visit: Payer: Self-pay | Admitting: Internal Medicine

## 2018-04-26 DIAGNOSIS — N6489 Other specified disorders of breast: Secondary | ICD-10-CM

## 2018-05-02 ENCOUNTER — Ambulatory Visit
Admission: RE | Admit: 2018-05-02 | Discharge: 2018-05-02 | Disposition: A | Discharge status: Home / Self Care | Payer: Medicare HMO | Source: Ambulatory Visit | Attending: Internal Medicine | Admitting: Internal Medicine

## 2018-05-02 DIAGNOSIS — N6489 Other specified disorders of breast: Secondary | ICD-10-CM

## 2018-05-02 DIAGNOSIS — R928 Other abnormal and inconclusive findings on diagnostic imaging of breast: Secondary | ICD-10-CM | POA: Diagnosis not present

## 2018-05-16 DIAGNOSIS — E7849 Other hyperlipidemia: Secondary | ICD-10-CM | POA: Diagnosis not present

## 2018-05-16 DIAGNOSIS — R82998 Other abnormal findings in urine: Secondary | ICD-10-CM | POA: Diagnosis not present

## 2018-05-16 DIAGNOSIS — E1165 Type 2 diabetes mellitus with hyperglycemia: Secondary | ICD-10-CM | POA: Diagnosis not present

## 2018-05-16 DIAGNOSIS — I10 Essential (primary) hypertension: Secondary | ICD-10-CM | POA: Diagnosis not present

## 2018-05-19 DIAGNOSIS — E1151 Type 2 diabetes mellitus with diabetic peripheral angiopathy without gangrene: Secondary | ICD-10-CM | POA: Diagnosis not present

## 2018-05-19 DIAGNOSIS — E7849 Other hyperlipidemia: Secondary | ICD-10-CM | POA: Diagnosis not present

## 2018-05-19 DIAGNOSIS — Z0000 Encounter for general adult medical examination without abnormal findings: Secondary | ICD-10-CM | POA: Diagnosis not present

## 2018-05-19 DIAGNOSIS — Z1389 Encounter for screening for other disorder: Secondary | ICD-10-CM | POA: Diagnosis not present

## 2018-05-19 DIAGNOSIS — K219 Gastro-esophageal reflux disease without esophagitis: Secondary | ICD-10-CM | POA: Diagnosis not present

## 2018-05-19 DIAGNOSIS — I10 Essential (primary) hypertension: Secondary | ICD-10-CM | POA: Diagnosis not present

## 2018-05-19 DIAGNOSIS — M25552 Pain in left hip: Secondary | ICD-10-CM | POA: Diagnosis not present

## 2018-05-19 DIAGNOSIS — I7389 Other specified peripheral vascular diseases: Secondary | ICD-10-CM | POA: Diagnosis not present

## 2018-05-19 DIAGNOSIS — M545 Low back pain: Secondary | ICD-10-CM | POA: Diagnosis not present

## 2018-06-06 ENCOUNTER — Encounter: Payer: Self-pay | Admitting: Podiatry

## 2018-06-06 ENCOUNTER — Ambulatory Visit: Payer: Medicare HMO | Admitting: Podiatry

## 2018-06-06 DIAGNOSIS — M79675 Pain in left toe(s): Secondary | ICD-10-CM

## 2018-06-06 DIAGNOSIS — B351 Tinea unguium: Secondary | ICD-10-CM | POA: Diagnosis not present

## 2018-06-06 DIAGNOSIS — M79674 Pain in right toe(s): Secondary | ICD-10-CM

## 2018-06-06 DIAGNOSIS — E1142 Type 2 diabetes mellitus with diabetic polyneuropathy: Secondary | ICD-10-CM | POA: Diagnosis not present

## 2018-06-06 NOTE — Patient Instructions (Signed)

## 2018-06-24 NOTE — Progress Notes (Signed)
Subjective: Catherine Ramsey presents today for diabetic foot care.  She relates painful, discolored, thick toenails which interfere with daily activities.  Pain is aggravated when wearing enclosed shoe gear and relieved with periodic professional debridement.  Catherine Ramsey relates no new pedal concerns on today's visit  Primary care physician Dr. Leanna Battles  Objective: Vascular Examination: Capillary refill time immediate to all 10 digits. Dorsalis pedis 1/4 bilaterally Posterior tibial pulses nonpalpable bilaterally Digital hair decreased x 10 digits Skin temperature gradient within normal limits bilaterally  Dermatological Examination: Skin with normal turgor texture and tone bilaterally Toenails 1-5 b/l discolored, thick, dystrophic with subungual debris and pain with palpation to nailbeds due to thickness of nails. No hyperkeratotic lesions No open wounds No interdigital macerations  Musculoskeletal: Muscle strength 5/5 to all LE muscle groups Hammertoe left second digit Hallux valgus with bunion deformity bilaterally  Neurological: Sensation intact with 10 gram monofilament. Vibratory sensation intact.  Assessment: 1.  Painful onychomycosis toenails 1-5 b/l  2.  NIDDM with PAD and neuropathy  Plan: 1. Continue diabetic foot care principles.  Literature dispensed to the patient on today. 2. Toenails 1-5 b/l were debrided in length and girth without iatrogenic bleeding. 3. Patient to continue soft, supportive shoe gear 4. Patient to report any pedal injuries to medical professional immediately. 5. Follow up 3 months. Patient/POA to call should there be a concern in the interim.

## 2018-06-26 DIAGNOSIS — M25552 Pain in left hip: Secondary | ICD-10-CM | POA: Diagnosis not present

## 2018-06-26 DIAGNOSIS — M545 Low back pain: Secondary | ICD-10-CM | POA: Diagnosis not present

## 2018-06-28 DIAGNOSIS — M1610 Unilateral primary osteoarthritis, unspecified hip: Secondary | ICD-10-CM | POA: Insufficient documentation

## 2018-07-20 ENCOUNTER — Ambulatory Visit (INDEPENDENT_AMBULATORY_CARE_PROVIDER_SITE_OTHER): Payer: Self-pay | Admitting: Physical Medicine and Rehabilitation

## 2018-08-17 ENCOUNTER — Ambulatory Visit: Payer: Medicare HMO | Admitting: Podiatry

## 2018-08-17 ENCOUNTER — Encounter: Payer: Self-pay | Admitting: Podiatry

## 2018-08-17 DIAGNOSIS — M79676 Pain in unspecified toe(s): Secondary | ICD-10-CM

## 2018-08-17 DIAGNOSIS — L600 Ingrowing nail: Secondary | ICD-10-CM

## 2018-08-17 DIAGNOSIS — L03031 Cellulitis of right toe: Secondary | ICD-10-CM

## 2018-08-17 MED ORDER — CEPHALEXIN 500 MG PO CAPS: 500.0000 mg | ORAL_CAPSULE | Freq: Two times a day (BID) | ORAL | 0 refills | Status: DC

## 2018-08-17 NOTE — Progress Notes (Signed)
Subjective:  Patient ID: Catherine Ramsey, female    DOB: August 08, 1945,  MRN: [id number]  Chief Complaint  Patient presents with  . Nail Problem    debridement of painful nails; "think i have ingrowns in both big toes (medial/lateral); all nails have been tender since last visit"    73 y.o. female presents with the above complaint.  Reports painful ingrown toenail to the right great toe states that she noticed pus coming out of it a couple days ago also has pain in the left great toe.  Has tried soaking and cutting the nails herself..   Review of Systems: Negative except as noted in the HPI. Denies N/V/F/Ch.  Past Medical History:  Diagnosis Date  . Achalasia - Type II 05/08/2010   Qualifier: Diagnosis of  By: Carlean Purl MD, Tonna Boehringer E   . Candida esophagitis ([place]) 03/26/2014  . Diabetes mellitus    type 2  . Family history of adverse reaction to anesthesia    son hard to wake up  . Hypertension     Current Outpatient Medications:  .  amLODipine (NORVASC) 10 MG tablet, Take 10 mg by mouth daily., Disp: , Rfl:  .  atenolol (TENORMIN) 50 MG tablet, Take 50 mg by mouth 2 (two) times daily., Disp: , Rfl:  .  cephALEXin (KEFLEX) 500 MG capsule, Take 1 capsule (500 mg total) by mouth 2 (two) times daily., Disp: 10 capsule, Rfl: 0 .  gabapentin (NEURONTIN) 300 MG capsule, Take 300 mg by mouth daily as needed (pain)., Disp: , Rfl:  .  HYDROcodone-homatropine (HYCODAN) 5-1.5 MG/5ML syrup, Take 5 mLs by mouth every 4 (four) hours as needed for cough., Disp: 120 mL, Rfl: 0 .  insulin aspart (NOVOLOG FLEXPEN) 100 UNIT/ML FlexPen, Inject 10 Units into the skin daily., Disp: , Rfl:  .  Insulin Glargine (TOUJEO SOLOSTAR) 300 UNIT/ML SOPN, Inject 20 Units into the skin daily. , Disp: , Rfl:  .  KLOR-CON M20 20 MEQ tablet, Take 20 mEq by mouth daily., Disp: , Rfl: 0 .  losartan-hydrochlorothiazide (HYZAAR) 100-25 MG tablet, Take 1 tablet by mouth daily., Disp: , Rfl:  .  potassium chloride (KLOR-CON) 8  MEQ tablet, Take 8 mEq by mouth 2 (two) times daily., Disp: , Rfl:  .  pravastatin (PRAVACHOL) 20 MG tablet, Take 20 mg by mouth daily., Disp: , Rfl:   Social History   Tobacco Use  Smoking Status Never Smoker  Smokeless Tobacco Never Used    No Known Allergies Objective:  There were no vitals filed for this visit. There is no height or weight on file to calculate BMI. Constitutional Well developed. Well nourished.  Vascular Dorsalis pedis pulses palpable bilaterally. Posterior tibial pulses palpable bilaterally. Capillary refill normal to all digits.  No cyanosis or clubbing noted. Pedal hair growth normal.  Neurologic Normal speech. Oriented to person, place, and time. Epicritic sensation to light touch grossly present bilaterally.  Dermatologic Painful paronychia at lateral nail borders of the hallux nail right with purulence noted upon incision and drainage No other open wounds. No skin lesions. Ingrown nail lateral border left hallux nail  Orthopedic: Normal joint ROM without pain or crepitus bilaterally. No visible deformities. No bony tenderness.   Radiographs: None Assessment:   1. Paronychia of great toe, right    Plan:  Patient was evaluated and treated and all questions answered.  Paronychia, right -Discussed with patient proceeding with incision and drainage of the paronychia.  Verbal consent obtained -Educated on post-procedure care  including soaking. Written instructions provided and reviewed. -Patient to follow up in 2 weeks for nail check.  Procedure: Incision and drainage of paronychia Anesthesia: 1.5 mL's of Marcaine half percent plain, 1.5 mL's lidocaine 1% plain Instrumentation: 15 blade, nail splitter Technique: Following sterile skin prep with Betadine, a 15 blade is used to incise the portion of the lateral nail border to expose the ingrown nail purulent material was released.  The nail was then split with a nail splitter and avulsed with a  hemostat.  The area was then thoroughly cleansed.  A dry sterile dressing was then applied Dressing: Dry, sterile, compression dressing. Disposition: Patient tolerated procedure well. Patient to return in 2 week for follow-up.  Ingrown toenail left great toe -Nail debrided and slant back fashion no evidence of paronychia  Return in about 2 weeks (around 08/31/2018) for paronychia right great toe f/u .

## 2018-08-17 NOTE — Patient Instructions (Signed)

## 2018-08-31 ENCOUNTER — Ambulatory Visit (INDEPENDENT_AMBULATORY_CARE_PROVIDER_SITE_OTHER): Payer: Medicare HMO | Admitting: Podiatry

## 2018-08-31 DIAGNOSIS — E1142 Type 2 diabetes mellitus with diabetic polyneuropathy: Secondary | ICD-10-CM

## 2018-08-31 DIAGNOSIS — M205X2 Other deformities of toe(s) (acquired), left foot: Secondary | ICD-10-CM | POA: Diagnosis not present

## 2018-08-31 DIAGNOSIS — M2042 Other hammer toe(s) (acquired), left foot: Secondary | ICD-10-CM

## 2018-08-31 NOTE — Progress Notes (Signed)
Subjective:  Patient ID: Catherine Ramsey, female    DOB: [birth year],  MRN: [id number]  Chief Complaint  Patient presents with  . Ingrown Toenail    2 Week FU - Right great toenail infected/oozing puss from nail (Diabetic)    73 y.o. female presents with the above complaint.  States that the toes are doing great she is having no problems at all very pleased.  Requesting care of her toenails today endorses diabetes and reports numbness in her feet.  Review of Systems: Negative except as noted in the HPI. Denies N/V/F/Ch.  Past Medical History:  Diagnosis Date  . Achalasia - Type II 05/08/2010   Qualifier: Diagnosis of  By: Carlean Purl MD, Tonna Boehringer E   . Candida esophagitis ([place]) 03/26/2014  . Diabetes mellitus    type 2  . Family history of adverse reaction to anesthesia    son hard to wake up  . Hypertension     Current Outpatient Medications:  .  amLODipine (NORVASC) 10 MG tablet, Take 10 mg by mouth daily., Disp: , Rfl:  .  atenolol (TENORMIN) 50 MG tablet, Take 50 mg by mouth 2 (two) times daily., Disp: , Rfl:  .  cephALEXin (KEFLEX) 500 MG capsule, Take 1 capsule (500 mg total) by mouth 2 (two) times daily., Disp: 10 capsule, Rfl: 0 .  gabapentin (NEURONTIN) 300 MG capsule, Take 300 mg by mouth daily as needed (pain)., Disp: , Rfl:  .  HYDROcodone-homatropine (HYCODAN) 5-1.5 MG/5ML syrup, Take 5 mLs by mouth every 4 (four) hours as needed for cough., Disp: 120 mL, Rfl: 0 .  insulin aspart (NOVOLOG FLEXPEN) 100 UNIT/ML FlexPen, Inject 10 Units into the skin daily., Disp: , Rfl:  .  Insulin Glargine (TOUJEO SOLOSTAR) 300 UNIT/ML SOPN, Inject 20 Units into the skin daily. , Disp: , Rfl:  .  KLOR-CON M20 20 MEQ tablet, Take 20 mEq by mouth daily., Disp: , Rfl: 0 .  losartan (COZAAR) 100 MG tablet, , Disp: , Rfl:  .  losartan-hydrochlorothiazide (HYZAAR) 100-25 MG tablet, Take 1 tablet by mouth daily., Disp: , Rfl:  .  potassium chloride (KLOR-CON) 8 MEQ tablet, Take 8 mEq by mouth 2  (two) times daily., Disp: , Rfl:  .  pravastatin (PRAVACHOL) 20 MG tablet, Take 20 mg by mouth daily., Disp: , Rfl:   Social History   Tobacco Use  Smoking Status Never Smoker  Smokeless Tobacco Never Used    No Known Allergies Objective:  There were no vitals filed for this visit. There is no height or weight on file to calculate BMI. Constitutional Well developed. Well nourished.  Vascular Dorsalis pedis pulses palpable bilaterally. Posterior tibial pulses palpable bilaterally. Capillary refill normal to all digits.  No cyanosis or clubbing noted. Pedal hair growth normal.  Neurologic Normal speech. Oriented to person, place, and time. Epicritic sensation to light touch grossly present bilaterally.  Dermatologic Elongated thickened dystrophic nails x10 Pre-ulcerative callus left second toe PIPJ  Orthopedic: Normal joint ROM without pain or crepitus bilaterally. Left second toe flexible digital contracture Lesser digital contractures bilaterally   Radiographs: None Assessment:   1. DM type 2 with diabetic peripheral neuropathy ([place])   2. Hammertoe of left foot   3. Contracture of toe of left foot    Plan:  Patient was evaluated and treated and all questions answered.  Paronychia, right -Resolved no pain.  Diabetes with DPN, Onychomycosis -Educated on diabetic footcare. Diabetic risk level 1 -Nails x10 debrided sharply and manually with  large nail nipper and rotary burr.   Procedure: Nail Debridement Rationale: Patient meets criteria for routine foot care due to DPN Type of Debridement: manual, sharp debridement. Instrumentation: Nail nipper, rotary burr. Number of Nails: 10  Flexible hammertoe left second toe with ulcerative callus -Discussed with patient that due to the pre-ulcer callus would benefit from flexor tenotomy to alleviate contracture and prevent ulceration.  -Patient has failed all conservative therapy and wishes to proceed with surgical  intervention. All risks, benefits, and alternatives discussed with patient. No guarantees given. Consent reviewed and signed by patient. -Planned procedures: Left 2nd toe flexor tenotomy.     Return for post op.

## 2018-08-31 NOTE — Patient Instructions (Signed)
Pre-Operative Instructions  Congratulations, you have decided to take an important step towards improving your quality of life.  You can be assured that the doctors and staff at [facility] will be with you every step of the way.  Here are some important things you should know:  1. Plan to be at the surgery center/hospital at least 1 (one) hour prior to your scheduled time, unless otherwise directed by the surgical center/hospital staff.  You must have a responsible adult accompany you, remain during the surgery and drive you home.  Make sure you have directions to the surgical center/hospital to ensure you arrive on time. 2. If you are having surgery at [facility] or [facility] hospitals, you will need a copy of your medical history and physical form from your family physician within one month prior to the date of surgery. We will give you a form for your primary physician to complete.  3. We make every effort to accommodate the date you request for surgery.  However, there are times where surgery dates or times have to be moved.  We will contact you as soon as possible if a change in schedule is required.   4. No aspirin/ibuprofen for one week before surgery.  If you are on aspirin, any non-steroidal anti-inflammatory medications (Mobic, Aleve, Ibuprofen) should not be taken seven (7) days prior to your surgery.  You make take Tylenol for pain prior to surgery.  5. Medications - If you are taking daily heart and blood pressure medications, seizure, reflux, allergy, asthma, anxiety, pain or diabetes medications, make sure you notify the surgery center/hospital before the day of surgery so they can tell you which medications you should take or avoid the day of surgery. 6. No food or drink after midnight the night before surgery unless directed otherwise by surgical center/hospital staff. 7. No alcoholic beverages 24-hours prior to surgery.  No smoking 24-hours prior or 24-hours after  surgery. 8. Wear loose pants or shorts. They should be loose enough to fit over bandages, boots, and casts. 9. Don't wear slip-on shoes. Sneakers are preferred. 10. Bring your boot with you to the surgery center/hospital.  Also bring crutches or a walker if your physician has prescribed it for you.  If you do not have this equipment, it will be provided for you after surgery. 11. If you have not been contacted by the surgery center/hospital by the day before your surgery, call to confirm the date and time of your surgery. 12. Leave-time from work may vary depending on the type of surgery you have.  Appropriate arrangements should be made prior to surgery with your employer. 13. Prescriptions will be provided immediately following surgery by your doctor.  Fill these as soon as possible after surgery and take the medication as directed. Pain medications will not be refilled on weekends and must be approved by the doctor. 14. Remove nail polish on the operative foot and avoid getting pedicures prior to surgery. 15. Wash the night before surgery.  The night before surgery wash the foot and leg well with water and the antibacterial soap provided. Be sure to pay special attention to beneath the toenails and in between the toes.  Wash for at least three (3) minutes. Rinse thoroughly with water and dry well with a towel.  Perform this wash unless told not to do so by your physician.  Enclosed: 1 Ice pack (please put in freezer the night before surgery)   1 Hibiclens skin cleaner     Pre-op instructions  If you have any questions regarding the instructions, please do not hesitate to call our office.  [place]: [address], [place], [place] [address] -- [phone]  [place]: [address] [place], [place] [address] -- [phone]  [place]: [address]  [place], [place] [address] -- [phone]  [place]: [address], [address], [place], [place] [address] -- [phone]  Website:  [email or web address] 

## 2018-09-05 ENCOUNTER — Ambulatory Visit: Payer: Medicare HMO | Admitting: Podiatry

## 2018-09-08 ENCOUNTER — Encounter: Payer: Medicare HMO | Admitting: Podiatry

## 2018-09-15 ENCOUNTER — Ambulatory Visit: Payer: Medicare HMO

## 2018-09-15 ENCOUNTER — Telehealth: Payer: Self-pay | Admitting: *Deleted

## 2018-09-15 NOTE — Telephone Encounter (Signed)
"  I was supposed to have, I missed it I didn't know I did, on my toe on my left foot.  The lady told me it was for the seventh, I thought it was for tomorrow.  I need to talk to Dr. March Rummage and let him know why I want to postpone the surgery for right now.  I'll give him a call back when I can talk to him.  Call back and give me a number that I can reach Dr. March Rummage."

## 2018-09-22 DIAGNOSIS — E1151 Type 2 diabetes mellitus with diabetic peripheral angiopathy without gangrene: Secondary | ICD-10-CM | POA: Diagnosis not present

## 2018-09-22 DIAGNOSIS — L03039 Cellulitis of unspecified toe: Secondary | ICD-10-CM | POA: Insufficient documentation

## 2018-09-22 DIAGNOSIS — I7389 Other specified peripheral vascular diseases: Secondary | ICD-10-CM | POA: Diagnosis not present

## 2018-09-22 DIAGNOSIS — E7849 Other hyperlipidemia: Secondary | ICD-10-CM | POA: Diagnosis not present

## 2018-09-22 DIAGNOSIS — I10 Essential (primary) hypertension: Secondary | ICD-10-CM | POA: Diagnosis not present

## 2018-09-22 DIAGNOSIS — Z6834 Body mass index (BMI) 34.0-34.9, adult: Secondary | ICD-10-CM | POA: Diagnosis not present

## 2018-09-29 NOTE — Progress Notes (Signed)
Erroneous encounter, please disregard

## 2018-10-17 NOTE — Telephone Encounter (Signed)
I am returning your call.  You wanted to reschedule your surgery that you missed with Dr. March Rummage?"  I don't want any more surgeries.  The two toes that he trimmed the last time I was there got infected.  I can hardly wear shoes now because they hurt so bad."  Well if your toes are infected, you need to be seen.  Are you Diabetic?  "Yes, I am."  You need to come in right away to see Dr. March Rummage.  Leave the scheduler a message to call you in the morning to get you in for an appointment.  "Okay, I will but I don't want any surgery."    I'm calling you back.  Are you taking an antibiotic or anything?  "No, I'm not taking anything."  Are you putting any ointments on it like Neosporin or Triple Antibiotic Ointment and putting a band-aid or anything on it?  "I put some Neosporin on it and put a sock on it one time."  Is much drainage coming out of them or are they red and inflamed?  "They were at one time but it has turned black around it."  Okay put some ointment, Neosporin, on them and a band-aid and I'll ask them to call you tomorrow to make an appointment.

## 2018-10-24 ENCOUNTER — Telehealth: Payer: Self-pay | Admitting: Podiatry

## 2018-10-24 NOTE — Telephone Encounter (Addendum)
I called pt and she said the left big toe is still painful after the surgery and it looks bad, the bottom is red, and the top is black and swollen. I told pt we would get her an appt to be seen this week, to perform daily epsom salt 1/2 cup to one qt water and cover with neosporin and gauze not bandaid.

## 2018-10-24 NOTE — Telephone Encounter (Signed)
Patient is having pain/no oozing or redness. Patient would like to know what to do next

## 2018-10-26 ENCOUNTER — Ambulatory Visit: Payer: Medicare HMO | Admitting: Podiatry

## 2018-10-26 ENCOUNTER — Other Ambulatory Visit: Payer: Self-pay

## 2018-10-26 DIAGNOSIS — L600 Ingrowing nail: Secondary | ICD-10-CM

## 2018-10-26 MED ORDER — NEOMYCIN-POLYMYXIN-HC 3.5-10000-1 OT SOLN: OTIC | 0 refills | Status: DC

## 2018-10-26 NOTE — Patient Instructions (Signed)

## 2018-10-27 NOTE — Progress Notes (Signed)
Subjective:  Patient ID: Catherine Ramsey, female    DOB: 09/07/1945,  MRN: [id number]  Chief Complaint  Patient presents with  . Ingrown Toenail    left foot ingrown toenail medial side, from last procedure, pt states that she is doing alot better due to the antibiotics she recieved last time, pt states big toe might be infected    73 y.o. female presents with the above complaint. Hx as above.  Review of Systems: Negative except as noted in the HPI. Denies N/V/F/Ch.  Past Medical History:  Diagnosis Date  . Achalasia - Type II 05/08/2010   Qualifier: Diagnosis of  By: Carlean Purl MD, Tonna Boehringer E   . Candida esophagitis ([place]) 03/26/2014  . Diabetes mellitus    type 2  . Family history of adverse reaction to anesthesia    son hard to wake up  . Hypertension     Current Outpatient Medications:  .  amLODipine (NORVASC) 10 MG tablet, Take 10 mg by mouth daily., Disp: , Rfl:  .  atenolol (TENORMIN) 50 MG tablet, Take 50 mg by mouth 2 (two) times daily., Disp: , Rfl:  .  cephALEXin (KEFLEX) 500 MG capsule, Take 1 capsule (500 mg total) by mouth 2 (two) times daily., Disp: 10 capsule, Rfl: 0 .  gabapentin (NEURONTIN) 300 MG capsule, Take 300 mg by mouth daily as needed (pain)., Disp: , Rfl:  .  HYDROcodone-homatropine (HYCODAN) 5-1.5 MG/5ML syrup, Take 5 mLs by mouth every 4 (four) hours as needed for cough., Disp: 120 mL, Rfl: 0 .  insulin aspart (NOVOLOG FLEXPEN) 100 UNIT/ML FlexPen, Inject 10 Units into the skin daily., Disp: , Rfl:  .  Insulin Glargine (TOUJEO SOLOSTAR) 300 UNIT/ML SOPN, Inject 20 Units into the skin daily. , Disp: , Rfl:  .  KLOR-CON M20 20 MEQ tablet, Take 20 mEq by mouth daily., Disp: , Rfl: 0 .  losartan (COZAAR) 100 MG tablet, , Disp: , Rfl:  .  losartan-hydrochlorothiazide (HYZAAR) 100-25 MG tablet, Take 1 tablet by mouth daily., Disp: , Rfl:  .  potassium chloride (KLOR-CON) 8 MEQ tablet, Take 8 mEq by mouth 2 (two) times daily., Disp: , Rfl:  .  pravastatin  (PRAVACHOL) 20 MG tablet, Take 20 mg by mouth daily., Disp: , Rfl:  .  neomycin-polymyxin-hydrocortisone (CORTISPORIN) OTIC solution, Apply 2 drops to the ingrown toenail site twice daily. Cover with band-aid., Disp: 10 mL, Rfl: 0  Social History   Tobacco Use  Smoking Status Never Smoker  Smokeless Tobacco Never Used    No Known Allergies Objective:  There were no vitals filed for this visit. There is no height or weight on file to calculate BMI. Constitutional Well developed. Well nourished.  Vascular Dorsalis pedis pulses palpable bilaterally. Posterior tibial pulses palpable bilaterally. Capillary refill normal to all digits.  No cyanosis or clubbing noted. Pedal hair growth normal.  Neurologic Normal speech. Oriented to person, place, and time. Epicritic sensation to light touch grossly present bilaterally.  Dermatologic Painful ingrowing nail at medial nail borders of the hallux nail left. No other open wounds. No skin lesions.  Orthopedic: Normal joint ROM without pain or crepitus bilaterally. No visible deformities. No bony tenderness.   Radiographs: None Assessment:   1. Ingrown nail    Plan:  Patient was evaluated and treated and all questions answered.  Ingrown Nail, left -Patient elects to proceed with minor surgery to remove ingrown toenail removal today. Consent reviewed and signed by patient. -Ingrown nail excised. See procedure note. -  Educated on post-procedure care including soaking. Written instructions provided and reviewed. -Patient to follow up in 2 weeks for nail check.  Procedure: Excision of Ingrown Toenail Location: Left 1st medial  nail borders. Anesthesia: Lidocaine 1% plain; 1.5 mL and Marcaine 0.5% plain; 1.5 mL, digital block. Skin Prep: Betadine. Dressing: Silvadene; telfa; dry, sterile, compression dressing. Technique: Following skin prep, the toe was exsanguinated and a tourniquet was secured at the base of the toe. The affected nail  border was freed, split with a nail splitter, and excised. Chemical matrixectomy was then performed with phenol and irrigated out with alcohol. The tourniquet was then removed and sterile dressing applied. Disposition: Patient tolerated procedure well. Patient to return in 2 weeks for follow-up.   No follow-ups on file.

## 2018-11-30 DIAGNOSIS — Z794 Long term (current) use of insulin: Secondary | ICD-10-CM | POA: Diagnosis not present

## 2018-11-30 DIAGNOSIS — E1165 Type 2 diabetes mellitus with hyperglycemia: Secondary | ICD-10-CM | POA: Diagnosis not present

## 2018-11-30 DIAGNOSIS — I10 Essential (primary) hypertension: Secondary | ICD-10-CM | POA: Diagnosis not present

## 2018-12-05 DIAGNOSIS — I10 Essential (primary) hypertension: Secondary | ICD-10-CM | POA: Diagnosis not present

## 2018-12-05 DIAGNOSIS — Z794 Long term (current) use of insulin: Secondary | ICD-10-CM | POA: Diagnosis not present

## 2018-12-05 DIAGNOSIS — E1165 Type 2 diabetes mellitus with hyperglycemia: Secondary | ICD-10-CM | POA: Diagnosis not present

## 2018-12-05 DIAGNOSIS — E1151 Type 2 diabetes mellitus with diabetic peripheral angiopathy without gangrene: Secondary | ICD-10-CM | POA: Diagnosis not present

## 2018-12-18 NOTE — Progress Notes (Unsigned)
This encounter was created in error - please disregard.

## 2019-01-25 ENCOUNTER — Encounter: Payer: Self-pay | Admitting: Internal Medicine

## 2019-01-26 DIAGNOSIS — I739 Peripheral vascular disease, unspecified: Secondary | ICD-10-CM | POA: Diagnosis not present

## 2019-01-26 DIAGNOSIS — M1612 Unilateral primary osteoarthritis, left hip: Secondary | ICD-10-CM | POA: Diagnosis not present

## 2019-01-26 DIAGNOSIS — K220 Achalasia of cardia: Secondary | ICD-10-CM | POA: Diagnosis not present

## 2019-01-26 DIAGNOSIS — I10 Essential (primary) hypertension: Secondary | ICD-10-CM | POA: Diagnosis not present

## 2019-01-26 DIAGNOSIS — E1151 Type 2 diabetes mellitus with diabetic peripheral angiopathy without gangrene: Secondary | ICD-10-CM | POA: Diagnosis not present

## 2019-01-26 DIAGNOSIS — M545 Low back pain: Secondary | ICD-10-CM | POA: Diagnosis not present

## 2019-02-07 ENCOUNTER — Ambulatory Visit (INDEPENDENT_AMBULATORY_CARE_PROVIDER_SITE_OTHER): Payer: Medicare HMO

## 2019-02-07 ENCOUNTER — Ambulatory Visit (INDEPENDENT_AMBULATORY_CARE_PROVIDER_SITE_OTHER): Payer: Medicare HMO | Admitting: Orthopaedic Surgery

## 2019-02-07 ENCOUNTER — Other Ambulatory Visit: Payer: Self-pay

## 2019-02-07 ENCOUNTER — Encounter: Payer: Self-pay | Admitting: Orthopaedic Surgery

## 2019-02-07 VITALS — Ht 67.5 in | Wt 205.0 lb

## 2019-02-07 DIAGNOSIS — M4807 Spinal stenosis, lumbosacral region: Secondary | ICD-10-CM

## 2019-02-07 DIAGNOSIS — M545 Low back pain: Secondary | ICD-10-CM

## 2019-02-07 DIAGNOSIS — G8929 Other chronic pain: Secondary | ICD-10-CM

## 2019-02-07 DIAGNOSIS — M25552 Pain in left hip: Secondary | ICD-10-CM

## 2019-02-07 DIAGNOSIS — M5442 Lumbago with sciatica, left side: Secondary | ICD-10-CM

## 2019-02-07 NOTE — Progress Notes (Signed)
Office Visit Note   Patient: Catherine Ramsey           Date of Birth: 1945-12-30           MRN: [id number] Visit Date: 02/07/2019              Requested by: Leanna Battles, MD [place],  [place] [address] PCP: Leanna Battles, MD   Assessment & Plan: Visit Diagnoses:  1. Chronic bilateral low back pain, unspecified whether sciatica present   2. Pain in left hip   3. Chronic left-sided low back pain with left-sided sciatica     Plan: This seems to be more of a spine issue than it does a hip issue.  I did let her know she does have some arthritis in her left hip but right now based on her clinical exam and signs and symptoms I do not feel that hip replacement is warranted.  However I do feel an MRI is warranted given the severe degenerative changes and scoliosis of the lumbar spine combined with her radicular symptoms going down the left side.  An MRI be helpful determining what her next intervention would be and to what level an ESI may be appropriate.  She agrees with this treatment plan as well.  We will see her back once we have the MRI.  Follow-Up Instructions: Return in about 3 weeks (around 02/28/2019).   Orders:  Orders Placed This Encounter  Procedures  . XR HIP UNILAT W OR W/O PELVIS 1V LEFT  . XR Lumbar Spine 2-3 Views   No orders of the defined types were placed in this encounter.     Procedures: No procedures performed   Clinical Data: No additional findings.   Subjective: Chief Complaint  Patient presents with  . Left Hip - Pain  . Lower Back - Pain  The patient is a very pleasant 73 year old female who comes in as referral from Dr. Bevelyn Buckles of [place] to evaluate right hip and leg pain as well as low back pain.  This is been slowly worsening over 2 to 3 years.  She says is painful to stand a lot and she points her sciatic region her low back as well as into the groin as a source of her pain but some of this is  actually proximal to the groin crease.  She does have a remote history of a right total hip arthroplasty.  She is a diabetic but reports good control.  She denies any weakness in her legs.  She does have some peripheral neuropathy that is longstanding but is minimal.  HPI  Review of Systems She currently denies any headache, chest pain, shortness of breath, fever, chills, nausea, vomiting  Objective: Vital Signs: Ht 5' 7.5" (1.715 m)   Wt 205 lb (93 kg)   BMI 31.63 kg/m   Physical Exam She is alert and orient x3 and in no acute distress Ortho Exam Examination of her right hip shows that it moves fluidly.  Examination of her left painful hip has no blocks to rotation with only mild pain in the groin area.  She has more pain of the lumbar spine with flexion extension and in the paraspinal muscles to the lumbar spine.  She does have an obvious slight curvature of her lumbar spine.  There is limitations with flexion extension.  She has a positive straight leg raise as well on the left side.  There is no weakness in her legs that  I can see on muscle exam. Specialty Comments:  No specialty comments available.  Imaging: Xr Hip Unilat W Or W/o Pelvis 1v Left  Result Date: 02/07/2019 An AP pelvis and lateral of the right hip shows a total hip arthroplasty on the right side with no complicating features.  The AP view does show moderate arthritic changes in the left hip with joint space narrowing  Xr Lumbar Spine 2-3 Views  Result Date: 02/07/2019 An AP and lateral of the lumbar spine shows severe degenerative scoliosis with significant arthritic changes at multiple levels as well as disc space narrowing.    PMFS History: Patient Active Problem List   Diagnosis Date Noted  . Trigger ring finger of right hand 10/07/2016  . Pain in finger of right hand 10/07/2016  . Candida esophagitis ([place]) 03/26/2014  . DIABETES MELLITUS, TYPE II 05/08/2010  . Achalasia - Type II 05/08/2010  . ABDOMINAL  PAIN, UPPER 05/08/2010  . Personal history of colonic polyps - adenomas, TV adenomas, serrated polyps 05/08/2010   Past Medical History:  Diagnosis Date  . Achalasia - Type II 05/08/2010   Qualifier: Diagnosis of  By: Carlean Purl MD, Tonna Boehringer E   . Candida esophagitis ([place]) 03/26/2014  . Diabetes mellitus    type 2  . Family history of adverse reaction to anesthesia    son hard to wake up  . Hypertension     History reviewed. No pertinent family history.  Past Surgical History:  Procedure Laterality Date  . ABDOMINAL HYSTERECTOMY    . BREAST BIOPSY     left/benign  . ESOPHAGEAL MANOMETRY N/A 04/01/2014   Procedure: ESOPHAGEAL MANOMETRY (EM);  Surgeon: Gatha Mayer, MD;  Location: [facility];  Service: Endoscopy;  Laterality: N/A;  . HELLER MYOTOMY N/A 12/07/2016   Procedure: LAPAROSCOPIC HELLER MYOTOMY, DOR FUNDOPLICATION, HIATAL HERNIA REPAIR, INTRAOPERATIVE UPPER ENDOSCOPY;  Surgeon: Jackolyn Confer, MD;  Location: [facility];  Service: General;  Laterality: N/A;  . TOTAL HIP ARTHROPLASTY     right pin then replacement   Social History   Occupational History  . Not on file  Tobacco Use  . Smoking status: Never Smoker  . Smokeless tobacco: Never Used  Substance and Sexual Activity  . Alcohol use: No  . Drug use: No  . Sexual activity: Yes

## 2019-02-24 DIAGNOSIS — Z20828 Contact with and (suspected) exposure to other viral communicable diseases: Secondary | ICD-10-CM | POA: Diagnosis not present

## 2019-02-24 DIAGNOSIS — Z7189 Other specified counseling: Secondary | ICD-10-CM | POA: Diagnosis not present

## 2019-02-25 ENCOUNTER — Emergency Department (HOSPITAL_COMMUNITY): Payer: Medicare HMO

## 2019-02-25 ENCOUNTER — Inpatient Hospital Stay (HOSPITAL_COMMUNITY)
Admission: EM | Admit: 2019-02-25 | Discharge: 2019-03-01 | DRG: 682 | Disposition: A | Discharge status: Home / Self Care | Payer: Medicare HMO | Attending: Family Medicine | Admitting: Family Medicine

## 2019-02-25 ENCOUNTER — Other Ambulatory Visit: Payer: Self-pay

## 2019-02-25 ENCOUNTER — Encounter (HOSPITAL_COMMUNITY): Payer: Self-pay | Admitting: Emergency Medicine

## 2019-02-25 ENCOUNTER — Observation Stay (HOSPITAL_COMMUNITY): Payer: Medicare HMO

## 2019-02-25 DIAGNOSIS — Z8249 Family history of ischemic heart disease and other diseases of the circulatory system: Secondary | ICD-10-CM

## 2019-02-25 DIAGNOSIS — E876 Hypokalemia: Secondary | ICD-10-CM | POA: Diagnosis present

## 2019-02-25 DIAGNOSIS — E669 Obesity, unspecified: Secondary | ICD-10-CM | POA: Diagnosis present

## 2019-02-25 DIAGNOSIS — R001 Bradycardia, unspecified: Secondary | ICD-10-CM

## 2019-02-25 DIAGNOSIS — E0865 Diabetes mellitus due to underlying condition with hyperglycemia: Secondary | ICD-10-CM | POA: Diagnosis not present

## 2019-02-25 DIAGNOSIS — I10 Essential (primary) hypertension: Secondary | ICD-10-CM | POA: Diagnosis present

## 2019-02-25 DIAGNOSIS — R4020 Unspecified coma: Secondary | ICD-10-CM | POA: Diagnosis not present

## 2019-02-25 DIAGNOSIS — U071 COVID-19: Secondary | ICD-10-CM | POA: Diagnosis present

## 2019-02-25 DIAGNOSIS — I361 Nonrheumatic tricuspid (valve) insufficiency: Secondary | ICD-10-CM | POA: Diagnosis not present

## 2019-02-25 DIAGNOSIS — R569 Unspecified convulsions: Secondary | ICD-10-CM | POA: Diagnosis not present

## 2019-02-25 DIAGNOSIS — Z833 Family history of diabetes mellitus: Secondary | ICD-10-CM | POA: Diagnosis not present

## 2019-02-25 DIAGNOSIS — R55 Syncope and collapse: Secondary | ICD-10-CM

## 2019-02-25 DIAGNOSIS — E1165 Type 2 diabetes mellitus with hyperglycemia: Secondary | ICD-10-CM | POA: Diagnosis present

## 2019-02-25 DIAGNOSIS — Z90710 Acquired absence of both cervix and uterus: Secondary | ICD-10-CM | POA: Diagnosis not present

## 2019-02-25 DIAGNOSIS — Z794 Long term (current) use of insulin: Secondary | ICD-10-CM

## 2019-02-25 DIAGNOSIS — Z6832 Body mass index (BMI) 32.0-32.9, adult: Secondary | ICD-10-CM

## 2019-02-25 DIAGNOSIS — D696 Thrombocytopenia, unspecified: Secondary | ICD-10-CM | POA: Diagnosis present

## 2019-02-25 DIAGNOSIS — Z8349 Family history of other endocrine, nutritional and metabolic diseases: Secondary | ICD-10-CM

## 2019-02-25 DIAGNOSIS — R0902 Hypoxemia: Secondary | ICD-10-CM | POA: Diagnosis not present

## 2019-02-25 DIAGNOSIS — IMO0001 Reserved for inherently not codable concepts without codable children: Secondary | ICD-10-CM

## 2019-02-25 DIAGNOSIS — N170 Acute kidney failure with tubular necrosis: Principal | ICD-10-CM

## 2019-02-25 DIAGNOSIS — N179 Acute kidney failure, unspecified: Secondary | ICD-10-CM | POA: Diagnosis not present

## 2019-02-25 DIAGNOSIS — Z209 Contact with and (suspected) exposure to unspecified communicable disease: Secondary | ICD-10-CM | POA: Diagnosis not present

## 2019-02-25 DIAGNOSIS — E119 Type 2 diabetes mellitus without complications: Secondary | ICD-10-CM

## 2019-02-25 DIAGNOSIS — E785 Hyperlipidemia, unspecified: Secondary | ICD-10-CM | POA: Diagnosis present

## 2019-02-25 DIAGNOSIS — E860 Dehydration: Secondary | ICD-10-CM | POA: Diagnosis present

## 2019-02-25 DIAGNOSIS — Z96641 Presence of right artificial hip joint: Secondary | ICD-10-CM | POA: Diagnosis present

## 2019-02-25 DIAGNOSIS — Z79899 Other long term (current) drug therapy: Secondary | ICD-10-CM

## 2019-02-25 DIAGNOSIS — Z791 Long term (current) use of non-steroidal anti-inflammatories (NSAID): Secondary | ICD-10-CM | POA: Diagnosis not present

## 2019-02-25 DIAGNOSIS — J9811 Atelectasis: Secondary | ICD-10-CM | POA: Diagnosis present

## 2019-02-25 DIAGNOSIS — G8929 Other chronic pain: Secondary | ICD-10-CM | POA: Diagnosis present

## 2019-02-25 DIAGNOSIS — E1159 Type 2 diabetes mellitus with other circulatory complications: Secondary | ICD-10-CM | POA: Diagnosis present

## 2019-02-25 DIAGNOSIS — M4186 Other forms of scoliosis, lumbar region: Secondary | ICD-10-CM | POA: Diagnosis present

## 2019-02-25 DIAGNOSIS — I152 Hypertension secondary to endocrine disorders: Secondary | ICD-10-CM | POA: Diagnosis present

## 2019-02-25 DIAGNOSIS — R5081 Fever presenting with conditions classified elsewhere: Secondary | ICD-10-CM | POA: Diagnosis not present

## 2019-02-25 DIAGNOSIS — R0602 Shortness of breath: Secondary | ICD-10-CM | POA: Diagnosis not present

## 2019-02-25 HISTORY — DX: Essential (primary) hypertension: I10

## 2019-02-25 HISTORY — DX: Syncope and collapse: R55

## 2019-02-25 HISTORY — DX: COVID-19: U07.1

## 2019-02-25 LAB — BASIC METABOLIC PANEL
Anion gap: 10 (ref 5–15)
BUN: 18 mg/dL (ref 8–23)
CO2: 25 mmol/L (ref 22–32)
Calcium: 8.1 mg/dL — ABNORMAL LOW (ref 8.9–10.3)
Chloride: 105 mmol/L (ref 98–111)
Creatinine, Ser: 2.45 mg/dL — ABNORMAL HIGH (ref 0.44–1.00)
GFR calc Af Amer: 22 mL/min — ABNORMAL LOW (ref >60)
GFR calc non Af Amer: 19 mL/min — ABNORMAL LOW (ref >60)
Glucose, Bld: 142 mg/dL — ABNORMAL HIGH (ref 70–99)
Potassium: 3.2 mmol/L — ABNORMAL LOW (ref 3.5–5.1)
Sodium: 140 mmol/L (ref 135–145)

## 2019-02-25 LAB — CBC WITH DIFFERENTIAL/PLATELET
Abs Immature Granulocytes: 0.01 10*3/uL (ref 0.00–0.07)
Basophils Absolute: 0 10*3/uL (ref 0.0–0.1)
Basophils Relative: 0 %
Eosinophils Absolute: 0 10*3/uL (ref 0.0–0.5)
Eosinophils Relative: 0 %
HCT: 33.6 % — ABNORMAL LOW (ref 36.0–46.0)
Hemoglobin: 10.9 g/dL — ABNORMAL LOW (ref 12.0–15.0)
Immature Granulocytes: 0 %
Lymphocytes Relative: 35 %
Lymphs Abs: 1.6 10*3/uL (ref 0.7–4.0)
MCH: 27.9 pg (ref 26.0–34.0)
MCHC: 32.4 g/dL (ref 30.0–36.0)
MCV: 86.2 fL (ref 80.0–100.0)
Monocytes Absolute: 0.4 10*3/uL (ref 0.1–1.0)
Monocytes Relative: 9 %
Neutro Abs: 2.5 10*3/uL (ref 1.7–7.7)
Neutrophils Relative %: 56 %
Platelets: 99 10*3/uL — ABNORMAL LOW (ref 150–400)
RBC: 3.9 MIL/uL (ref 3.87–5.11)
RDW: 13.1 % (ref 11.5–15.5)
WBC: 4.5 10*3/uL (ref 4.0–10.5)
nRBC: 0 % (ref 0.0–0.2)

## 2019-02-25 LAB — SARS CORONAVIRUS 2 BY RT PCR (HOSPITAL ORDER, PERFORMED IN ~~LOC~~ HOSPITAL LAB): SARS Coronavirus 2: POSITIVE — AB

## 2019-02-25 LAB — CBG MONITORING, ED: Glucose-Capillary: 140 mg/dL — ABNORMAL HIGH (ref 70–99)

## 2019-02-25 LAB — TROPONIN I (HIGH SENSITIVITY): Troponin I (High Sensitivity): 16 ng/L (ref <18)

## 2019-02-25 MED ORDER — ENOXAPARIN SODIUM 30 MG/0.3ML ~~LOC~~ SOLN
30.0000 mg | SUBCUTANEOUS | Status: DC
  Administered 2019-02-26 – 2019-02-27 (×2): 30 mg via SUBCUTANEOUS
  Filled 2019-02-25 (×2): qty 0.3

## 2019-02-25 MED ORDER — ACETAMINOPHEN 650 MG RE SUPP: 650.0000 mg | Freq: Four times a day (QID) | RECTAL | Status: DC | PRN

## 2019-02-25 MED ORDER — SODIUM CHLORIDE 0.9 % IV BOLUS
500.0000 mL | Freq: Once | INTRAVENOUS | Status: AC
  Administered 2019-02-25: 500 mL via INTRAVENOUS

## 2019-02-25 MED ORDER — ONDANSETRON HCL 4 MG PO TABS: 4.0000 mg | ORAL_TABLET | Freq: Four times a day (QID) | ORAL | Status: DC | PRN

## 2019-02-25 MED ORDER — INSULIN GLARGINE 100 UNIT/ML ~~LOC~~ SOLN
15.0000 [IU] | Freq: Every day | SUBCUTANEOUS | Status: DC
  Administered 2019-02-26: 15 [IU] via SUBCUTANEOUS
  Filled 2019-02-25 (×3): qty 0.15

## 2019-02-25 MED ORDER — SODIUM CHLORIDE 0.9% FLUSH
3.0000 mL | Freq: Two times a day (BID) | INTRAVENOUS | Status: DC
  Administered 2019-02-26 – 2019-03-01 (×4): 3 mL via INTRAVENOUS

## 2019-02-25 MED ORDER — ONDANSETRON HCL 4 MG/2ML IJ SOLN: 4.0000 mg | Freq: Four times a day (QID) | INTRAMUSCULAR | Status: DC | PRN

## 2019-02-25 MED ORDER — INSULIN ASPART 100 UNIT/ML ~~LOC~~ SOLN
0.0000 [IU] | Freq: Three times a day (TID) | SUBCUTANEOUS | Status: DC
  Administered 2019-02-26: 1 [IU] via SUBCUTANEOUS

## 2019-02-25 MED ORDER — ATENOLOL 25 MG PO TABS
50.0000 mg | ORAL_TABLET | Freq: Two times a day (BID) | ORAL | Status: DC
  Filled 2019-02-25: qty 2

## 2019-02-25 MED ORDER — PRAVASTATIN SODIUM 40 MG PO TABS
40.0000 mg | ORAL_TABLET | Freq: Every day | ORAL | Status: DC
  Administered 2019-02-25 – 2019-02-28 (×4): 40 mg via ORAL
  Filled 2019-02-25 (×4): qty 1

## 2019-02-25 MED ORDER — AMLODIPINE BESYLATE 10 MG PO TABS
10.0000 mg | ORAL_TABLET | Freq: Every day | ORAL | Status: DC
  Administered 2019-02-26 – 2019-03-01 (×4): 10 mg via ORAL
  Filled 2019-02-25 (×4): qty 1

## 2019-02-25 MED ORDER — GABAPENTIN 300 MG PO CAPS: 300.0000 mg | ORAL_CAPSULE | Freq: Every day | ORAL | Status: DC | PRN

## 2019-02-25 MED ORDER — POTASSIUM CHLORIDE CRYS ER 20 MEQ PO TBCR
40.0000 meq | EXTENDED_RELEASE_TABLET | Freq: Once | ORAL | Status: AC
  Administered 2019-02-25: 40 meq via ORAL
  Filled 2019-02-25: qty 2

## 2019-02-25 MED ORDER — ACETAMINOPHEN 325 MG PO TABS
650.0000 mg | ORAL_TABLET | Freq: Four times a day (QID) | ORAL | Status: DC | PRN
  Administered 2019-02-27: 650 mg via ORAL
  Filled 2019-02-25: qty 2

## 2019-02-25 NOTE — H&P (Addendum)
History and Physical    Catherine Ramsey WCB:[id number] DOB: 1946/02/15 DOA: 02/25/2019  PCP: Leanna Battles, MD  Patient coming from: Home  I have personally briefly reviewed patient's old medical records in [place]  Chief Complaint: Syncope  HPI: Catherine Ramsey is a 73 y.o. female with medical history significant of DM2, HTN.  Patient presents to the ED with c/o 2 syncopal episodes.  One last Friday and another today.  She had just gotten home from bringing her daughter food, she did not spend any extended period of time out in the heat.  She states she was feeling well, and sat on the chair next to her husband when she all of a sudden syncopized.  She had no prodrome of lightheadedness, headache, palpitations, or chest pain.  It is reported that she had lost consciousness for 10 minutes, and when she woke up she is alert and oriented.  She is asymptomatic at this time, no CP, no SOB, no cough, no fevers.   ED Course: K 3.2.  Creat 2.45 up from 0.9 baseline in 2018.  Trop 16.  CXR neg, COVID has surprisingly come back positive!   Review of Systems: As per HPI, otherwise all review of systems negative.  Past Medical History:  Diagnosis Date  . Achalasia - Type II 05/08/2010   Qualifier: Diagnosis of  By: Carlean Purl MD, Tonna Boehringer E   . Candida esophagitis ([place]) 03/26/2014  . Diabetes mellitus    type 2  . Family history of adverse reaction to anesthesia    son hard to wake up  . Hypertension     Past Surgical History:  Procedure Laterality Date  . ABDOMINAL HYSTERECTOMY    . BREAST BIOPSY     left/benign  . ESOPHAGEAL MANOMETRY N/A 04/01/2014   Procedure: ESOPHAGEAL MANOMETRY (EM);  Surgeon: Gatha Mayer, MD;  Location: [facility];  Service: Endoscopy;  Laterality: N/A;  . HELLER MYOTOMY N/A 12/07/2016   Procedure: LAPAROSCOPIC HELLER MYOTOMY, DOR FUNDOPLICATION, HIATAL HERNIA REPAIR, INTRAOPERATIVE UPPER ENDOSCOPY;  Surgeon: Jackolyn Confer, MD;  Location: [facility];  Service: General;  Laterality: N/A;  . TOTAL HIP ARTHROPLASTY     right pin then replacement     reports that she has never smoked. She has never used smokeless tobacco. She reports that she does not drink alcohol or use drugs.  No Known Allergies  No family history on file. No sick contacts.  Prior to Admission medications   Medication Sig Start Date End Date Taking? Authorizing Provider  amLODipine (NORVASC) 10 MG tablet Take 10 mg by mouth daily.   Yes [provider]  atenolol (TENORMIN) 50 MG tablet Take 50 mg by mouth 2 (two) times daily.   Yes [provider]  gabapentin (NEURONTIN) 300 MG capsule Take 300 mg by mouth daily as needed (pain).   Yes [provider]  insulin aspart (NOVOLOG FLEXPEN) 100 UNIT/ML FlexPen Inject 1-10 Units into the skin 3 (three) times daily with meals. Sliding scale   Yes [provider]  insulin glargine (LANTUS) 100 UNIT/ML injection Inject 20 Units into the skin daily.   Yes [provider]  KLOR-CON M20 20 MEQ tablet Take 20 mEq by mouth daily. 09/06/17  Yes [provider]  losartan-hydrochlorothiazide (HYZAAR) 100-25 MG tablet Take 1 tablet by mouth daily.   Yes [provider]  pravastatin (PRAVACHOL) 40 MG tablet Take 40 mg by mouth at bedtime. 12/02/18  Yes [provider]  Physical Exam: Vitals:   02/25/19 2015 02/25/19 2030 02/25/19 2100 02/25/19 2115  BP: 140/70 (!) 162/97 118/67 (!) 110/55  Pulse: (!) 45 (!) 42 (!) 43 (!) 43  Resp: 17 12 15 16   Temp:      TempSrc:      SpO2: 96% 100% 96% 95%  Weight:      Height:        Constitutional: NAD, calm, comfortable Eyes: PERRL, lids and conjunctivae normal ENMT: Mucous membranes are moist. Posterior pharynx clear of any exudate or lesions.Normal dentition.  Neck: normal, supple, no masses, no thyromegaly Respiratory: clear to auscultation bilaterally, no wheezing, no crackles. Normal respiratory effort. No  accessory muscle use.  Cardiovascular: Regular rate and rhythm, no murmurs / rubs / gallops. No extremity edema. 2+ pedal pulses. No carotid bruits.  Abdomen: no tenderness, no masses palpated. No hepatosplenomegaly. Bowel sounds positive.  Musculoskeletal: no clubbing / cyanosis. No joint deformity upper and lower extremities. Good ROM, no contractures. Normal muscle tone.  Skin: no rashes, lesions, ulcers. No induration Neurologic: CN 2-12 grossly intact. Sensation intact, DTR normal. Strength 5/5 in all 4.  Psychiatric: Normal judgment and insight. Alert and oriented x 3. Normal mood.    Labs on Admission: I have personally reviewed following labs and imaging studies  CBC: Recent Labs  Lab 02/25/19 2121  WBC 4.5  NEUTROABS 2.5  HGB 10.9*  HCT 33.6*  MCV 86.2  PLT 99*   Basic Metabolic Panel: Recent Labs  Lab 02/25/19 2121  NA 140  K 3.2*  CL 105  CO2 25  GLUCOSE 142*  BUN 18  CREATININE 2.45*  CALCIUM 8.1*   GFR: Estimated Creatinine Clearance: 24.4 mL/min (A) (by C-G formula based on SCr of 2.45 mg/dL (H)). Liver Function Tests: No results for input(s): AST, ALT, ALKPHOS, BILITOT, PROT, ALBUMIN in the last 168 hours. No results for input(s): LIPASE, AMYLASE in the last 168 hours. No results for input(s): AMMONIA in the last 168 hours. Coagulation Profile: No results for input(s): INR, PROTIME in the last 168 hours. Cardiac Enzymes: No results for input(s): CKTOTAL, CKMB, CKMBINDEX, TROPONINI in the last 168 hours. BNP (last 3 results) No results for input(s): PROBNP in the last 8760 hours. HbA1C: No results for input(s): HGBA1C in the last 72 hours. CBG: Recent Labs  Lab 02/25/19 2030  GLUCAP 140*   Lipid Profile: No results for input(s): CHOL, HDL, LDLCALC, TRIG, CHOLHDL, LDLDIRECT in the last 72 hours. Thyroid Function Tests: No results for input(s): TSH, T4TOTAL, FREET4, T3FREE, THYROIDAB in the last 72 hours. Anemia Panel: No results for input(s):  VITAMINB12, FOLATE, FERRITIN, TIBC, IRON, RETICCTPCT in the last 72 hours. Urine analysis:    Component Value Date/Time   COLORURINE YELLOW 11/07/2013 1112   APPEARANCEUR CLOUDY (A) 11/07/2013 1112   LABSPEC 1.028 11/07/2013 1112   PHURINE 6.0 11/07/2013 1112   GLUCOSEU NEGATIVE 11/07/2013 1112   HGBUR NEGATIVE 11/07/2013 1112   BILIRUBINUR NEGATIVE 11/07/2013 1112   KETONESUR NEGATIVE 11/07/2013 1112   PROTEINUR 30 (A) 11/07/2013 1112   UROBILINOGEN 0.2 11/07/2013 1112   NITRITE NEGATIVE 11/07/2013 1112   LEUKOCYTESUR MODERATE (A) 11/07/2013 1112    Radiological Exams on Admission: Dg Chest Port 1 View  Result Date: 02/25/2019 CLINICAL DATA:  Syncope EXAM: PORTABLE CHEST 1 VIEW COMPARISON:  07/08/2012 FINDINGS: Minimal elevation of left diaphragm. Mild atelectasis at left base. No pleural effusion. Borderline heart size. No pneumothorax. IMPRESSION: Mild bibasilar atelectasis. Electronically Signed   By: Donavan Foil  M.D.   On: 02/25/2019 21:45    EKG: Independently reviewed.  Assessment/Plan Principal Problem:   AKI (acute kidney injury) ([place]) Active Problems:   DM2 (diabetes mellitus, type 2) (HCC)   HTN (hypertension)   Syncope   COVID-19 virus detected    1. AKI - 1. Unclear cause 2. ? NSAID use, she does use Ibuprofen 2-3 times a day for past couple of months for back pain 1. Holding ibuprofen 3. Holding lisinopril-hctz 4. Repeat BMP in AM 5. US renal 6. Strict intake and output 2. Syncope - 1. Syncope pathway 2. 2d echo 3. Tele monitor 4. Serial trops 3. COVID-19 virus detected - 1. No fever, no SOB, no cough, just syncope and AKI.  O2 sat 100% on RA. 2. Monitor for development of COVID symptoms 3. Checking D.dimer given COVID and syncope... 1. VQ scan if positive 4. HTN - 1. Hold lisinopril-hctz 2. Continue amlodipine and atenolol 5. DM2 - 1. Lantus 15 daily 2. Sensitive SSI AC  DVT prophylaxis: Lovenox Code Status: Full Family Communication: No  family in room Disposition Plan: Home after admit Consults called: None Admission status: Place in 26     , [facility]  How to contact the [facility] Attending or Consulting provider [place] or covering provider during after hours [place], for this patient?  1. Check the care team in [facility] and look for a) attending/consulting [facility] provider listed and b) the [facility] team listed 2. Log into [email or web address]  Amion Physician Scheduling and messaging for groups and whole hospitals  On call and physician scheduling software for group practices, residents, hospitalists and other medical providers for call, clinic, rotation and shift schedules. OnCall [employer] is a hospital-wide system for scheduling doctors and paging doctors on call. EasyPlot is for scientific plotting and data analysis.  [email or web address]  and use [facility]'s universal password to access. If you do not have the password, please contact the hospital operator.  3. Locate the [facility] provider you are looking for under [facility] and page to a number that you can be directly reached. 4. If you still have difficulty reaching the provider, please page the [facility] (Director on Call) for the Hospitalists listed on amion for assistance.  02/25/2019, 11:40 PM

## 2019-02-25 NOTE — ED Notes (Addendum)
Unsuccessful blood draw. Tech Bre informed

## 2019-02-25 NOTE — ED Provider Notes (Signed)
[facility] EMERGENCY DEPARTMENT Provider Note   CSN: [id number] Arrival date & time: 02/25/19  1907    History   Chief Complaint Chief Complaint  Patient presents with  . Loss of Consciousness    HPI Catherine Ramsey is a 73 y.o. female with past medical history of type 2 diabetes, hypertension, presenting to the emergency department with multiple syncopal episodes.  Patient states she had one episode last week and again today.  She had just gotten home from bringing her daughter food, she did not spend any extended period of time out in the heat.  She states she was feeling well, and sat on the chair next to her husband when she all of a sudden syncopized.  She had no prodrome of lightheadedness, headache, palpitations, or chest pain.  It is reported that she had lost consciousness for 10 minutes, and when she woke up she is alert and oriented.  She states this is very similar to the initial syncopal episode last week, she did not have any prodrome.  She feels well and is asymptomatic at this time.  Not on anticoagulation.     The history is provided by the patient.    Past Medical History:  Diagnosis Date  . Achalasia - Type II 05/08/2010   Qualifier: Diagnosis of  By: Carlean Purl MD, Tonna Boehringer E   . Candida esophagitis ([place]) 03/26/2014  . Diabetes mellitus    type 2  . Family history of adverse reaction to anesthesia    son hard to wake up  . Hypertension     Patient Active Problem List   Diagnosis Date Noted  . Trigger ring finger of right hand 10/07/2016  . Pain in finger of right hand 10/07/2016  . Candida esophagitis ([place]) 03/26/2014  . DIABETES MELLITUS, TYPE II 05/08/2010  . Achalasia - Type II 05/08/2010  . ABDOMINAL PAIN, UPPER 05/08/2010  . Personal history of colonic polyps - adenomas, TV adenomas, serrated polyps 05/08/2010    Past Surgical History:  Procedure Laterality Date  . ABDOMINAL HYSTERECTOMY    . BREAST BIOPSY     left/benign  .  ESOPHAGEAL MANOMETRY N/A 04/01/2014   Procedure: ESOPHAGEAL MANOMETRY (EM);  Surgeon: Gatha Mayer, MD;  Location: [facility];  Service: Endoscopy;  Laterality: N/A;  . HELLER MYOTOMY N/A 12/07/2016   Procedure: LAPAROSCOPIC HELLER MYOTOMY, DOR FUNDOPLICATION, HIATAL HERNIA REPAIR, INTRAOPERATIVE UPPER ENDOSCOPY;  Surgeon: Jackolyn Confer, MD;  Location: [facility] ORS;  Service: General;  Laterality: N/A;  . TOTAL HIP ARTHROPLASTY     right pin then replacement     OB History   No obstetric history on file.      Home Medications    Prior to Admission medications   Medication Sig Start Date End Date Taking? Authorizing Provider  amLODipine (NORVASC) 10 MG tablet Take 10 mg by mouth daily.    [provider]  atenolol (TENORMIN) 50 MG tablet Take 50 mg by mouth 2 (two) times daily.    [provider]  cephALEXin (KEFLEX) 500 MG capsule Take 1 capsule (500 mg total) by mouth 2 (two) times daily. 08/17/18   Evelina Bucy, DPM  gabapentin (NEURONTIN) 300 MG capsule Take 300 mg by mouth daily as needed (pain).    [provider]  HYDROcodone-homatropine (HYCODAN) 5-1.5 MG/5ML syrup Take 5 mLs by mouth every 4 (four) hours as needed for cough. 12/08/16   Jackolyn Confer, MD  insulin aspart (NOVOLOG FLEXPEN) 100 UNIT/ML FlexPen Inject  10 Units into the skin daily.    [provider]  Insulin Glargine (TOUJEO SOLOSTAR) 300 UNIT/ML SOPN Inject 20 Units into the skin daily.     [provider]  KLOR-CON M20 20 MEQ tablet Take 20 mEq by mouth daily. 09/06/17   [provider]  losartan (COZAAR) 100 MG tablet  08/17/18   [provider]  losartan-hydrochlorothiazide (HYZAAR) 100-25 MG tablet Take 1 tablet by mouth daily.    [provider]  neomycin-polymyxin-hydrocortisone (CORTISPORIN) OTIC solution Apply 2 drops to the ingrown toenail site twice daily. Cover with band-aid. 10/26/18   Evelina Bucy, DPM  potassium chloride  (KLOR-CON) 8 MEQ tablet Take 8 mEq by mouth 2 (two) times daily.    [provider]  pravastatin (PRAVACHOL) 20 MG tablet Take 20 mg by mouth daily.    [provider]    Family History No family history on file.  Social History Social History   Tobacco Use  . Smoking status: Never Smoker  . Smokeless tobacco: Never Used  Substance Use Topics  . Alcohol use: No  . Drug use: No     Allergies   Patient has no known allergies.   Review of Systems Review of Systems  Constitutional: Negative for fever.  Eyes: Negative for visual disturbance.  Respiratory: Negative for shortness of breath.   Cardiovascular: Negative for chest pain and palpitations.  Neurological: Positive for syncope. Negative for headaches.  All other systems reviewed and are negative.    Physical Exam Updated Vital Signs BP (!) 110/55   Pulse (!) 43   Temp 98.2 F (36.8 C) (Oral)   Resp 16   Ht 5' 6.5" (1.689 m)   Wt 93 kg   SpO2 95%   BMI 32.59 kg/m   Physical Exam Vitals signs and nursing note reviewed.  Constitutional:      General: She is not in acute distress.    Appearance: She is well-developed. She is not ill-appearing.  HENT:     Head: Normocephalic and atraumatic.  Eyes:     Conjunctiva/sclera: Conjunctivae normal.  Cardiovascular:     Rate and Rhythm: Regular rhythm. Bradycardia present.  Pulmonary:     Effort: Pulmonary effort is normal. No respiratory distress.     Breath sounds: Normal breath sounds.  Abdominal:     General: Bowel sounds are normal.     Palpations: Abdomen is soft.     Tenderness: There is no abdominal tenderness. There is no guarding or rebound.  Skin:    General: Skin is warm.  Neurological:     Mental Status: She is alert.     Comments: Mental Status:  Alert, oriented, thought content appropriate, able to give a coherent history. Speech fluent without evidence of aphasia. Able to follow 2 step commands without difficulty.  Cranial  Nerves:  II:  Peripheral visual fields grossly normal, pupils equal, round, reactive to light III,IV, VI: ptosis not present, extra-ocular motions intact bilaterally  V,VII: smile symmetric, facial light touch sensation equal VIII: hearing grossly normal to voice  X: uvula elevates symmetrically  XI: bilateral shoulder shrug symmetric and strong XII: midline tongue extension without fassiculations Motor:  Normal tone. 5/5 in upper and lower extremities bilaterally including strong and equal grip strength and dorsiflexion/plantar flexion Sensory: grossly normal in all extremities.  Cerebellar: normal finger-to-nose with bilateral upper extremities Gait: normal gait and balance CV: distal pulses palpable throughout    Psychiatric:  Behavior: Behavior normal.      ED Treatments / Results  Labs (all labs ordered are listed, but only abnormal results are displayed) Labs Reviewed  BASIC METABOLIC PANEL - Abnormal; Notable for the following components:      Result Value   Potassium 3.2 (*)    Glucose, Bld 142 (*)    Creatinine, Ser 2.45 (*)    Calcium 8.1 (*)    GFR calc non Af Amer 19 (*)    GFR calc Af Amer 22 (*)    All other components within normal limits  CBC WITH DIFFERENTIAL/PLATELET - Abnormal; Notable for the following components:   Hemoglobin 10.9 (*)    HCT 33.6 (*)    Platelets 99 (*)    All other components within normal limits  CBG MONITORING, ED - Abnormal; Notable for the following components:   Glucose-Capillary 140 (*)    All other components within normal limits  SARS CORONAVIRUS 2 (HOSPITAL ORDER, [facility])  URINALYSIS, ROUTINE W REFLEX MICROSCOPIC  TROPONIN I (HIGH SENSITIVITY)    EKG EKG Interpretation  Date/Time:  Sunday February 25 2019 19:23:07 EDT Ventricular Rate:  52 PR Interval:    QRS Duration: 102 QT Interval:  427 QTC Calculation: 398 R Axis:   25 Text Interpretation:  Sinus rhythm Atrial premature  complex No significant change since last tracing Confirmed by Wandra Arthurs ([id number]) on 02/25/2019 8:56:47 PM   Radiology Dg Chest Port 1 View  Result Date: 02/25/2019 CLINICAL DATA:  Syncope EXAM: PORTABLE CHEST 1 VIEW COMPARISON:  07/08/2012 FINDINGS: Minimal elevation of left diaphragm. Mild atelectasis at left base. No pleural effusion. Borderline heart size. No pneumothorax. IMPRESSION: Mild bibasilar atelectasis. Electronically Signed   By: Donavan Foil M.D.   On: 02/25/2019 21:45    Procedures Procedures (including critical care time)  Medications Ordered in ED Medications  sodium chloride 0.9 % bolus 500 mL (500 mLs Intravenous New Bag/Given 02/25/19 2136)     Initial Impression / Assessment and Plan / ED Course  I have reviewed the triage vital signs and the nursing notes.  Pertinent labs & imaging results that were available during my care of the patient were reviewed by me and considered in my medical decision making (see chart for details).        Patient presenting to the ED with 2 syncopal episodes without a prodrome over the last week.  She states she was sitting in chair both times and had a sudden loss of consciousness without any prodrome of lightheadedness, palpitations, or other symptoms.  Patient is noted to be bradycardic in sinus rhythm on arrival without heart block.  However per chart review, patient is noted to have low normal heart rates in the past in the 50s and 60s.  She has normal neuro exam.  No headaches or vision changes to suggest head bleed.  Labs today reveal high-sensitivity troponin of 16, mild hypokalemia and new AKI with a creatinine of 2.45.  CBG is 140.  Patient's presentation is concerning for possible cardiac cause of syncope given sudden loss of consciousness without prodrome.  Consult placed to [facility] hospitalist for admission.  Patient discussed with Dr. Darl Householder, who agrees with plan for admission.  The patient appears reasonably stabilized for  admission considering the current resources, flow, and capabilities available in the ED at this time, and I doubt any other [facility] requiring further screening and/or treatment in the ED prior to admission.   Final Clinical Impressions(s) /  ED Diagnoses   Final diagnoses:  AKI (acute kidney injury) ([place])  Syncope, unspecified syncope type    ED Discharge Orders    None       Zubair Lofton, [place] N, PA-C 02/25/19 2229    Drenda Freeze, MD 02/27/19 1530

## 2019-02-25 NOTE — ED Triage Notes (Signed)
Pt to ED via [facility] after reported having a syncopal episode lasting approx 10 mins.  EMS reports pt was alert and oriented x's 4 when she woke up.  Pt st's she had same episode last week but was not seen at that time.  Pt denies any pain or discomfort.  Pt alert and oriented x's 4

## 2019-02-25 NOTE — ED Triage Notes (Signed)
Pt did not fall, was sitting in a chair when episode happened

## 2019-02-26 ENCOUNTER — Observation Stay (HOSPITAL_COMMUNITY): Payer: Medicare HMO

## 2019-02-26 ENCOUNTER — Inpatient Hospital Stay (HOSPITAL_COMMUNITY): Payer: Medicare HMO

## 2019-02-26 DIAGNOSIS — N179 Acute kidney failure, unspecified: Secondary | ICD-10-CM

## 2019-02-26 DIAGNOSIS — E785 Hyperlipidemia, unspecified: Secondary | ICD-10-CM | POA: Diagnosis present

## 2019-02-26 DIAGNOSIS — E860 Dehydration: Secondary | ICD-10-CM | POA: Diagnosis present

## 2019-02-26 DIAGNOSIS — R001 Bradycardia, unspecified: Secondary | ICD-10-CM | POA: Diagnosis not present

## 2019-02-26 DIAGNOSIS — G8929 Other chronic pain: Secondary | ICD-10-CM | POA: Diagnosis present

## 2019-02-26 DIAGNOSIS — I10 Essential (primary) hypertension: Secondary | ICD-10-CM | POA: Diagnosis present

## 2019-02-26 DIAGNOSIS — I361 Nonrheumatic tricuspid (valve) insufficiency: Secondary | ICD-10-CM | POA: Diagnosis not present

## 2019-02-26 DIAGNOSIS — Z79899 Other long term (current) drug therapy: Secondary | ICD-10-CM | POA: Diagnosis not present

## 2019-02-26 DIAGNOSIS — R569 Unspecified convulsions: Secondary | ICD-10-CM

## 2019-02-26 DIAGNOSIS — Z794 Long term (current) use of insulin: Secondary | ICD-10-CM | POA: Diagnosis not present

## 2019-02-26 DIAGNOSIS — R55 Syncope and collapse: Secondary | ICD-10-CM | POA: Diagnosis present

## 2019-02-26 DIAGNOSIS — Z8349 Family history of other endocrine, nutritional and metabolic diseases: Secondary | ICD-10-CM | POA: Diagnosis not present

## 2019-02-26 DIAGNOSIS — D696 Thrombocytopenia, unspecified: Secondary | ICD-10-CM | POA: Diagnosis present

## 2019-02-26 DIAGNOSIS — N170 Acute kidney failure with tubular necrosis: Secondary | ICD-10-CM | POA: Diagnosis present

## 2019-02-26 DIAGNOSIS — Z8249 Family history of ischemic heart disease and other diseases of the circulatory system: Secondary | ICD-10-CM | POA: Diagnosis not present

## 2019-02-26 DIAGNOSIS — E0865 Diabetes mellitus due to underlying condition with hyperglycemia: Secondary | ICD-10-CM | POA: Diagnosis not present

## 2019-02-26 DIAGNOSIS — M4186 Other forms of scoliosis, lumbar region: Secondary | ICD-10-CM | POA: Diagnosis present

## 2019-02-26 DIAGNOSIS — R5081 Fever presenting with conditions classified elsewhere: Secondary | ICD-10-CM | POA: Diagnosis not present

## 2019-02-26 DIAGNOSIS — U071 COVID-19: Secondary | ICD-10-CM | POA: Diagnosis present

## 2019-02-26 DIAGNOSIS — E876 Hypokalemia: Secondary | ICD-10-CM | POA: Diagnosis present

## 2019-02-26 DIAGNOSIS — J9811 Atelectasis: Secondary | ICD-10-CM | POA: Diagnosis present

## 2019-02-26 DIAGNOSIS — Z90710 Acquired absence of both cervix and uterus: Secondary | ICD-10-CM | POA: Diagnosis not present

## 2019-02-26 DIAGNOSIS — Z791 Long term (current) use of non-steroidal anti-inflammatories (NSAID): Secondary | ICD-10-CM | POA: Diagnosis not present

## 2019-02-26 DIAGNOSIS — E1165 Type 2 diabetes mellitus with hyperglycemia: Secondary | ICD-10-CM | POA: Diagnosis present

## 2019-02-26 DIAGNOSIS — E669 Obesity, unspecified: Secondary | ICD-10-CM | POA: Diagnosis present

## 2019-02-26 DIAGNOSIS — Z833 Family history of diabetes mellitus: Secondary | ICD-10-CM | POA: Diagnosis not present

## 2019-02-26 DIAGNOSIS — Z96641 Presence of right artificial hip joint: Secondary | ICD-10-CM | POA: Diagnosis present

## 2019-02-26 DIAGNOSIS — Z6832 Body mass index (BMI) 32.0-32.9, adult: Secondary | ICD-10-CM | POA: Diagnosis not present

## 2019-02-26 HISTORY — DX: Acute kidney failure, unspecified: N17.9

## 2019-02-26 LAB — GLUCOSE, CAPILLARY
Glucose-Capillary: 150 mg/dL — ABNORMAL HIGH (ref 70–99)
Glucose-Capillary: 193 mg/dL — ABNORMAL HIGH (ref 70–99)
Glucose-Capillary: 102 mg/dL — ABNORMAL HIGH (ref 70–99)
Glucose-Capillary: 101 mg/dL — ABNORMAL HIGH (ref 70–99)

## 2019-02-26 LAB — URINALYSIS, COMPLETE (UACMP) WITH MICROSCOPIC
Bilirubin Urine: NEGATIVE
Glucose, UA: NEGATIVE mg/dL
Hgb urine dipstick: NEGATIVE
Ketones, ur: NEGATIVE mg/dL
Leukocytes,Ua: NEGATIVE
Nitrite: NEGATIVE
Protein, ur: 100 mg/dL — AB
Specific Gravity, Urine: 1.009 (ref 1.005–1.030)
pH: 5 (ref 5.0–8.0)

## 2019-02-26 LAB — BASIC METABOLIC PANEL
Anion gap: 13 (ref 5–15)
BUN: 21 mg/dL (ref 8–23)
CO2: 20 mmol/L — ABNORMAL LOW (ref 22–32)
Calcium: 8.2 mg/dL — ABNORMAL LOW (ref 8.9–10.3)
Chloride: 106 mmol/L (ref 98–111)
Creatinine, Ser: 2.26 mg/dL — ABNORMAL HIGH (ref 0.44–1.00)
GFR calc Af Amer: 24 mL/min — ABNORMAL LOW (ref >60)
GFR calc non Af Amer: 21 mL/min — ABNORMAL LOW (ref >60)
Glucose, Bld: 213 mg/dL — ABNORMAL HIGH (ref 70–99)
Potassium: 3.7 mmol/L (ref 3.5–5.1)
Sodium: 139 mmol/L (ref 135–145)

## 2019-02-26 LAB — TROPONIN I (HIGH SENSITIVITY): Troponin I (High Sensitivity): 14 ng/L (ref <18)

## 2019-02-26 LAB — HEMOGLOBIN A1C
Hgb A1c MFr Bld: 9.1 % — ABNORMAL HIGH (ref 4.8–5.6)
Mean Plasma Glucose: 214.47 mg/dL

## 2019-02-26 LAB — TSH: TSH: 2.59 u[IU]/mL (ref 0.350–4.500)

## 2019-02-26 LAB — ECHOCARDIOGRAM COMPLETE
Height: 67 in
Weight: 3195.79 oz

## 2019-02-26 LAB — MAGNESIUM: Magnesium: 1.4 mg/dL — ABNORMAL LOW (ref 1.7–2.4)

## 2019-02-26 LAB — D-DIMER, QUANTITATIVE: D-Dimer, Quant: 4.83 ug/mL-FEU — ABNORMAL HIGH (ref 0.00–0.50)

## 2019-02-26 MED ORDER — TECHNETIUM TO 99M ALBUMIN AGGREGATED
1.5600 | Freq: Once | INTRAVENOUS | Status: AC | PRN
  Administered 2019-02-26: 1.56 via INTRAVENOUS

## 2019-02-26 MED ORDER — SODIUM CHLORIDE 0.9 % IV SOLN
INTRAVENOUS | Status: AC
  Administered 2019-02-26 – 2019-02-27 (×3): via INTRAVENOUS

## 2019-02-26 MED ORDER — INSULIN ASPART 100 UNIT/ML ~~LOC~~ SOLN
3.0000 [IU] | Freq: Three times a day (TID) | SUBCUTANEOUS | Status: DC
  Administered 2019-02-26 – 2019-03-01 (×6): 3 [IU] via SUBCUTANEOUS

## 2019-02-26 MED ORDER — INSULIN ASPART 100 UNIT/ML ~~LOC~~ SOLN
0.0000 [IU] | Freq: Three times a day (TID) | SUBCUTANEOUS | Status: DC
  Administered 2019-02-26: 3 [IU] via SUBCUTANEOUS
  Administered 2019-02-27: 6 [IU] via SUBCUTANEOUS
  Administered 2019-02-27: 2 [IU] via SUBCUTANEOUS
  Administered 2019-02-28: 5 [IU] via SUBCUTANEOUS
  Administered 2019-02-28: 11 [IU] via SUBCUTANEOUS

## 2019-02-26 MED ORDER — ENSURE ENLIVE PO LIQD
237.0000 mL | Freq: Two times a day (BID) | ORAL | Status: DC
  Administered 2019-02-28 – 2019-03-01 (×2): 237 mL via ORAL

## 2019-02-26 MED ORDER — INSULIN GLARGINE 100 UNIT/ML ~~LOC~~ SOLN
20.0000 [IU] | Freq: Every day | SUBCUTANEOUS | Status: DC
  Administered 2019-02-27 – 2019-03-01 (×3): 20 [IU] via SUBCUTANEOUS
  Filled 2019-02-26 (×3): qty 0.2

## 2019-02-26 NOTE — Progress Notes (Signed)
Chaplain visited patient requesting prayer.  Patient was sitting upright on phone when chaplain came in room.  Chaplain was aware that pt's mother was across the hall.  Patient is one of 8; she has six sisters and one brother.  Her 71 year old mother has Covid as well.  She was talking to one of her sisters when chaplain entered the room, she is aware that her mother also was found to have cancer and does not have much time.  "we are making plans" she said.  Patient herself is mother of eight.  She said her husband is a Presenter, broadcasting who was currently ill in the ED.  She named other family members with Covid. She believes they contracted at church even though they were of them worshipping.   Patient wanted prayer.  However, neurologist came to the room and asked chaplain to depart.  Chaplain rec'd calls to other floors while waiting. Will try to call patient later for prayer. Rev. Tamsen Snider Pager [phone]

## 2019-02-26 NOTE — Consult Note (Addendum)
NEURO HOSPITALIST CONSULT NOTE   Requestig physician: Dr. Clementeen Graham  Reason for Consult: Syncope  History obtained from:   Patient and Chart     HPI:                                                                                                                                          Catherine Ramsey is an 73 y.o. female w/ a PMHx notable for HTN, HLD, DMII who presented to the ED after her second episode of syncope in two days. Per patient report today, she had been feeling unwell for several days, ~ 4 days prior to her first syncopal events on 07/24 in the late afternoon. She stated that she had not eaten anything in that time period nor was she drinking more than a glass of water daily. That afternoon she had continued to feel "achy" and tired but nothing more so than usual. She had gone outside to sit with her husband as he likes to sit in the sun, but after approximately 20-39min she could no longer take the heat and went inside. She recalls feeling cold and clammy prior to her husband gently slapping her on the cheeks to wake her up some unknown time later. Her overall condition did not worsen as a result so she ignored the event until on the afternoon of 07/26 a very similar event occurred; with this event she came to in the presence of EMS personal. Duration of the syncopal spell was unknown, as she was inside for several minutes before she syncopized.  For both events she denied a prodrome, specifically denying headache, nausea, visual changes or visual aura, auditory aura, focal weakness, tremor, muscle fasciculations or fever.  She endorsed decreased urinary output that tapered off to a dark yellow color over the past few days. She denied fever, cough, dyspnea, constipation, diarrhea, dysuria, hematuria, abdominal pain or skin breakdown.   The patient denied missing doses of her antihypertensives and specifically recalled taking the combo ACEi/thiazide medication. She  also endorses using 200 mg tablets of ibuprofen ~3 times per day for chronic back pain. She also endorses a history of decreased blood pressure readings at home with her diastolic decreasing most notably.   Past Medical History:  Diagnosis Date  . Achalasia - Type II 05/08/2010   Qualifier: Diagnosis of  By: Carlean Purl MD, Tonna Boehringer E   . Candida esophagitis ([place]) 03/26/2014  . Diabetes mellitus    type 2  . Family history of adverse reaction to anesthesia    son hard to wake up  . Hypertension     Past Surgical History:  Procedure Laterality Date  . ABDOMINAL HYSTERECTOMY    . BREAST BIOPSY     left/benign  . ESOPHAGEAL MANOMETRY N/A 04/01/2014  Procedure: ESOPHAGEAL MANOMETRY (EM);  Surgeon: Gatha Mayer, MD;  Location: [facility];  Service: Endoscopy;  Laterality: N/A;  . HELLER MYOTOMY N/A 12/07/2016   Procedure: LAPAROSCOPIC HELLER MYOTOMY, DOR FUNDOPLICATION, HIATAL HERNIA REPAIR, INTRAOPERATIVE UPPER ENDOSCOPY;  Surgeon: Jackolyn Confer, MD;  Location: [facility];  Service: General;  Laterality: N/A;  . TOTAL HIP ARTHROPLASTY     right pin then replacement   No family history on file.            Family History: Mother HTN, Father HTN  Social History:  reports that she has never smoked. She has never used smokeless tobacco. She reports that she does not drink alcohol or use drugs.  No Known Allergies  MEDICATIONS:                                                                                                                     Scheduled: . amLODipine  10 mg Oral Daily  . enoxaparin (LOVENOX) injection  30 mg Subcutaneous Q24H  . feeding supplement (ENSURE ENLIVE)  237 mL Oral BID BM  . insulin aspart  0-15 Units Subcutaneous TID WC  . insulin aspart  3 Units Subcutaneous TID WC  . [START ON 02/27/2019] insulin glargine  20 Units Subcutaneous Daily  . pravastatin  40 mg Oral QHS  . sodium chloride flush  3 mL Intravenous Q12H    ROS:                                                                                                                                        History obtained from the patient  ROS negative except as per HPI.  Blood pressure (!) 151/69, pulse (!) 47, temperature 98.7 F (37.1 C), temperature source Oral, resp. rate 18, height 5\' 7"  (1.702 m), weight 90.6 kg, SpO2 91 %.   General Examination:  Physical Exam  HEENT-  Normocephalic, no lesions, without obvious abnormality.  Normal external eye and conjunctiva.   Cardiovascular- S1-S2 audible, pulses palpable throughout   Lungs-no rhonchi or wheezing noted, no excessive working breathing.  Saturations within normal limits Abdomen- All 4 quadrants palpated and nontender Extremities- Warm, dry and intact Musculoskeletal-no joint tenderness, deformity or swelling Skin-warm and dry, no hyperpigmentation, vitiligo, or suspicious lesions  Neurological Examination Mental Status: Alert, oriented, thought content appropriate.  Speech fluent without evidence of aphasia.  Able to follow 3 step commands without difficulty. Cranial Nerves: II: Discs flat bilaterally; Visual fields grossly normal,  III,IV, VI: ptosis not present, extra-ocular motions intact bilaterally pupils equal, round, reactive to light and accommodation V,VII: smile symmetric, facial light touch sensation normal bilaterally VIII: hearing normal bilaterally IX,X: uvula rises symmetrically XI: bilateral shoulder shrug XII: midline tongue extension Motor: Right : Upper extremity   5/5    Left:     Upper extremity   5/5  Lower extremity   5/5     Lower extremity   5/5 Tone and bulk:normal tone throughout; no atrophy noted Sensory: Pinprick and light touch intact throughout, bilaterally Deep Tendon Reflexes: 2+ and symmetric throughout Plantars: Right: downgoing   Left: downgoing Cerebellar: normal finger-to-nose, normal  rapid alternating movements and normal heel-to-shin test Gait: normal station, gait not tested due to IV length    Lab Results: Basic Metabolic Panel: Recent Labs  Lab 02/25/19 2121 02/26/19 0231  NA 140 139  K 3.2* 3.7  CL 105 106  CO2 25 20*  GLUCOSE 142* 213*  BUN 18 21  CREATININE 2.45* 2.26*  CALCIUM 8.1* 8.2*  MG  --  1.4*   CBC: Recent Labs  Lab 02/25/19 2121  WBC 4.5  NEUTROABS 2.5  HGB 10.9*  HCT 33.6*  MCV 86.2  PLT 99*   Cardiac Enzymes: No results for input(s): CKTOTAL, CKMB, CKMBINDEX, TROPONINI in the last 168 hours.  Lipid Panel: No results for input(s): CHOL, TRIG, HDL, CHOLHDL, VLDL, LDLCALC in the last 168 hours.  Imaging: US Renal  Result Date: 02/26/2019 CLINICAL DATA:  Acute kidney injury, COVID positive EXAM: RENAL / URINARY TRACT ULTRASOUND COMPLETE COMPARISON:  None. FINDINGS: Right Kidney: Renal measurements: 8.8 x 4.7 x 5.1 cm = volume: 107 mL . Echogenicity within normal limits. No mass or hydronephrosis visualized. Left Kidney: Renal measurements: 10.4 x 5.3 x 5.6 cm = volume: 160 mL. Echogenicity within normal limits. No mass or hydronephrosis visualized. Bladder: Not discretely visualized/underdistended. IMPRESSION: Bilateral kidneys are within normal limits.  No hydronephrosis. Bladder is not discretely visualized. Electronically Signed   By: Julian Hy M.D.   On: 02/26/2019 02:58   Dg Chest Port 1 View  Result Date: 02/25/2019 CLINICAL DATA:  Syncope EXAM: PORTABLE CHEST 1 VIEW COMPARISON:  07/08/2012 FINDINGS: Minimal elevation of left diaphragm. Mild atelectasis at left base. No pleural effusion. Borderline heart size. No pneumothorax. IMPRESSION: Mild bibasilar atelectasis. Electronically Signed   By: Donavan Foil M.D.   On: 02/25/2019 21:45   02/26/2019, 3:36 PM   Assessment: 73 y.o. female w/ a PMHx notable for HTN, HLD, DMII who presented to the ED after her second episode of syncope in two days.  1. Overall presentation  suggestive of COVID induced anorexia combining with thiazide diuretic use and heat exposure, precipitating multiple syncopal events.  2. Per chart review and patient supplied history there was no evidence of seizure like activity.  3. Cardiac monitoring unremarkable, no history of exercise-induced or  stress induced events, no migrainous like prodrome, or cardiac valvular abnormalities.  4. Prolonged time down was roughly estimated by the patient to the Hospitalist as ~89min but today she could not definitively determine the time frame.  5. An EEG was requested to assess for possible epileptiform activity. This is likely to be low-yield given syncope as the most likely etiology for her presentation.     Recommendations: 1. EEG  2. IV hydration   Kathi Ludwig, MD [facility] Medicine, PGY-3  I have interviewed and examined the patient. 73 year old female presenting with syncopal events in the setting of volume depletion. Plan is for EEG. IV hydration will likely result in improvement. Covid management as per primary team.   Electronically signed: Dr. Kerney Elbe

## 2019-02-26 NOTE — Progress Notes (Signed)
EEG Completed; Results Pending  

## 2019-02-26 NOTE — Procedures (Signed)
Patient Name: Catherine Ramsey  MRN: [id number]  Epilepsy Attending: Lora Havens  Referring Physician/Provider: Dr Vassie Moment Date: 02/26/19 Duration: 23.15mins  Patient history: 74 y.o. female w/ a PMHx notable for HTN, HLD, DMII who presented to the ED after her second episode of syncope in two days. EEG ordered to evaluate for seizure  Level of alertness: awake, asleep  AEDs during EEG study: None  Technical aspects: This EEG study was done with scalp electrodes positioned according to the 10-20 International system of electrode placement. Electrical activity was reviewed with a high frequency filter of 70Hz  and a low frequency filter of 1Hz . EEG data were recorded continuously and digitally stored.   BACKGROUND ACTIVITY: Posterior dominant rhythm: The posterior dominant rhythm consists of 9-10 Hz activity of moderate voltage (25-35 uV) seen predominantly in posterior head regions, symmetric and reactive to eye opening and eye closing.          Slowing: Intermittent 3-5 Hz  theta-delta slowing in left temporal region  SLEEP RECORDINGS:  Vertex waves maximal in frontocentral region were noted.  ACTIVATION PROCEDURES:  Physiologic photic driving was seen during photic stimulation.  IMPRESSION: This study is suggestive of cortical dysfunction in left temporal lobe. No seizures or epileptiform discharges were seen throughout the recording.  [name]

## 2019-02-26 NOTE — Progress Notes (Signed)
Initial Nutrition Assessment  DOCUMENTATION CODES:   Obesity unspecified  INTERVENTION:   -Ensure Enlive po BID, each supplement provides 350 kcal and 20 grams of protein  NUTRITION DIAGNOSIS:   Increased nutrient needs related to acute illness as evidenced by estimated needs.  GOAL:   Patient will meet greater than or equal to 90% of their needs  MONITOR:   PO intake, Supplement acceptance, Labs, Weight trends, I & O's  REASON FOR ASSESSMENT:   Malnutrition Screening Tool    ASSESSMENT:   73 year old obese female with history of diabetes mellitus and hypertension presented with 2 episodes of syncope. Pt is COVID-19 positive.  **RD working remotely**  Patient reports not eating or drinking well for ~4 days since symptoms began. Pt ate 90% of breakfast this morning . Pt has been ordered Ensure supplements, will continue given increased needs from COVID-19 infection.  Per weight records, pt with minimal weight loss recently.   Medications reviewed. Labs reviewed:  CBGs: 150-193 Low Mg GFR: 24  NUTRITION - FOCUSED PHYSICAL EXAM:  Unable to perform -working remotely  Diet Order:   Diet Order            Diet Carb Modified Fluid consistency: Thin; Room service appropriate? Yes  Diet effective now              EDUCATION NEEDS:   No education needs have been identified at this time  Skin:  Skin Assessment: Reviewed RN Assessment  Last BM:  7/25  Height:   Ht Readings from Last 1 Encounters:  02/26/19 5\' 7"  (1.702 m)    Weight:   Wt Readings from Last 1 Encounters:  02/26/19 90.6 kg    Ideal Body Weight:  61.3 kg  BMI:  Body mass index is 31.28 kg/m.  Estimated Nutritional Needs:   Kcal:  1700-1900  Protein:  75-85g  Fluid:  1.9L/day  Clayton Bibles, MS, RD, LDN [place] [facility] Pager: [phone] After Hours Pager: [phone]

## 2019-02-26 NOTE — Progress Notes (Signed)
Pt HR dipped down to 47 for around 5 seconds without symptoms or complaints. Pt came back up to the 90s without problem. Withheld Atenolol.  Will continue to monitor.  Marcene Brawn, RN  02/26/19 8:33 AM

## 2019-02-26 NOTE — Progress Notes (Signed)
Called nurse to discuss Patient's COVID status; nurse checking with ordering physician to see if EEG required due to patient's COVID status.

## 2019-02-26 NOTE — Progress Notes (Addendum)
PROGRESS NOTE                                                                                                                                                                                                             Patient Demographics:    Catherine Ramsey, is a 73 y.o. female, DOB - 17-Jun-1946, EAV:[id number]  Admit date - 02/25/2019   Admitting Physician Etta Quill, DO  Outpatient Primary MD for the patient is Leanna Battles, MD  LOS - 0  Outpatient Specialists none  Chief Complaint  Patient presents with  . Loss of Consciousness       Brief Narrative 73 year old obese female with history of diabetes mellitus and hypertension presented with 2 episodes of syncope.  One episode was 2 days prior to admission and the next on the day of admission.  2 days prior she was sitting on the chair where her husband noticed her to be somnolent and frothing from her mouth and woke up after several minutes.  On the day of admission she was sitting on the chair next to her husband again when all of a sudden she passed out.  She does not remember how long she was gone for (thinks this was several minutes) but saw EMS sitting next to her when she woke up.  Denies any prodromal symptoms including lightheadedness, dizziness, blurred vision, headache, chest pain, palpitations.  No witnessed seizures, bowel or urinary incontinence.  Denies similar symptoms in the past.  Denies any new medications, recent illness, fevers, chills, nausea, vomiting, dysuria, diarrhea, weakness or numbness in her extremities.  Her niece was recently tested positive for COVID-19 and her mother is hospitalized with COVID-19 infection as well. In the ED vitals were stable.  She was hypokalemic with potassium of 3.2, noted to have acute kidney injury with creatinine of 2.45 and high-sensitivity troponin of 16.  EKG showed sinus bradycardia at 52 with no ST-T changes.   Patient was tested positive for COVID-19.    Subjective:  Patient reports feeling tired.  Denies any shortness of breath dizziness or lightheadedness.    Assessment  & Plan :    Principal Problem: Syncope ([place]) Cardiac versus neurogenic. Noted for bradycardia on the monitor in the mid to high 40s.  Hold atenolol for now.  Check 2D echo to rule out any valvular or  wall motion abnormality.  Elevated d-dimer.  TSH normal. VQ scan ordered.  Given history of frothing from her mouth and syncope for several minutes will obtain EEG to rule out seizures.  Head CT unremarkable. If EEG negative will request cardiology consult (possibly outpatient Holter monitoring).    AKI (acute kidney injury) ([place]) Suspect ATN with dehydration and NSAID use.  Patient reports taking multiple frequent ibuprofen for pain. Add IV NS. Avoid NSAIDs and nephrotoxic agents.  Monitor renal function closely.  Check renal ultrasound.   Active Problems: COVID-19 infection. Patient has no fever or respiratory symptoms.  Her niece and mother are both infected with COVID-19.  Will monitor closely.  Maintain airborne and contact isolation. Check ferritin, CRP and d-dimer daily.  Sinus bradycardia Heart rate in mid to high 40s.  Asymptomatic.  Continue telemetry monitoring.  Follow 2D echo.  TSH normal.  Work-up negative, cardiology consult for possible outpatient Holter monitoring    DM2 (diabetes mellitus, type 2), uncontrolled with hyperglycemia (HCC) Increase Lantus dose to 20 units daily and add pre-meal aspart.  A1c of 9.  Hyperlipidemia Continue statin   Essential hypertension Continue glutamine.  Hold atenolol given bradycardia  Thrombocytopenia Monitor closely.  No signs of bleeding.  Patient status: Inpatient Patient presenting with syncope with bradycardia, acute kidney injury (baseline creatinine >3 times normal) requiring IV hydration and uncontrolled diabetes.  For this reason patient needs to be  monitored in an inpatient setting closely with risk of further worsening of her renal function and associated risk of seizures and possible PE with her syncope.  Patient needs to be monitored for >2 midnights.   Code Status : Full code  Family Communication  : None  Disposition Plan  : Home possibly tomorrow once work-up completed and renal function improved  Barriers For Discharge : Active symptoms  Consults  : None  Procedures  : CT head, 2D echo, EEG  DVT Prophylaxis  :  Lovenox -  Lab Results  Component Value Date   PLT 99 (L) 02/25/2019    Antibiotics  :  Anti-infectives (From admission, onward)   None        Objective:   Vitals:   02/26/19 0112 02/26/19 0808 02/26/19 0809 02/26/19 0811  BP: (!) 154/77 (!) 151/69    Pulse:  (!) 47 100 (!) 47  Resp:  18    Temp:  98.7 F (37.1 C)    TempSrc:  Oral    SpO2: 100% 91%    Weight:      Height:        Wt Readings from Last 3 Encounters:  02/26/19 90.6 kg  02/07/19 93 kg  12/07/16 86.2 kg     Intake/Output Summary (Last 24 hours) at 02/26/2019 1044 Last data filed at 02/26/2019 1000 Gross per 24 hour  Intake 980 ml  Output 325 ml  Net 655 ml     Physical Exam  Gen: not in distress, fatigue HEENT:  moist mucosa, supple neck Chest: clear b/l, no added sounds CVS: S1-S2 bradycardic, no murmurs rub or gallop GI: soft, NT, ND, BS+ Musculoskeletal: warm, no edema     Data Review:    CBC Recent Labs  Lab 02/25/19 2121  WBC 4.5  HGB 10.9*  HCT 33.6*  PLT 99*  MCV 86.2  MCH 27.9  MCHC 32.4  RDW 13.1  LYMPHSABS 1.6  MONOABS 0.4  EOSABS 0.0  BASOSABS 0.0    Chemistries  Recent Labs  Lab 02/25/19 2121 02/26/19 0231  NA 140 139  K 3.2* 3.7  CL 105 106  CO2 25 20*  GLUCOSE 142* 213*  BUN 18 21  CREATININE 2.45* 2.26*  CALCIUM 8.1* 8.2*  MG  --  1.4*   ------------------------------------------------------------------------------------------------------------------ No results for  input(s): CHOL, HDL, LDLCALC, TRIG, CHOLHDL, LDLDIRECT in the last 72 hours.  Lab Results  Component Value Date   HGBA1C 9.1 (H) 02/26/2019   ------------------------------------------------------------------------------------------------------------------ Recent Labs    02/26/19 0231  TSH 2.590   ------------------------------------------------------------------------------------------------------------------ No results for input(s): VITAMINB12, FOLATE, FERRITIN, TIBC, IRON, RETICCTPCT in the last 72 hours.  Coagulation profile No results for input(s): INR, PROTIME in the last 168 hours.  Recent Labs    02/26/19 0231  DDIMER 4.83*    Cardiac Enzymes No results for input(s): CKMB, TROPONINI, MYOGLOBIN in the last 168 hours.  Invalid input(s): CK ------------------------------------------------------------------------------------------------------------------ No results found for: BNP  Inpatient Medications  Scheduled Meds: . amLODipine  10 mg Oral Daily  . atenolol  50 mg Oral BID  . enoxaparin (LOVENOX) injection  30 mg Subcutaneous Q24H  . feeding supplement (ENSURE ENLIVE)  237 mL Oral BID BM  . insulin aspart  0-9 Units Subcutaneous TID WC  . insulin glargine  15 Units Subcutaneous Daily  . pravastatin  40 mg Oral QHS  . sodium chloride flush  3 mL Intravenous Q12H   Continuous Infusions: PRN Meds:.acetaminophen **OR** acetaminophen, gabapentin, ondansetron **OR** ondansetron (ZOFRAN) IV  Micro Results Recent Results (from the past 240 hour(s))  SARS Coronavirus 2 (CEPHEID - Performed in [place] hospital lab), Hosp Order     Status: Abnormal   Collection Time: 02/25/19  9:32 PM   Specimen: Nasopharyngeal Swab  Result Value Ref Range Status   SARS Coronavirus 2 POSITIVE (A) NEGATIVE Final    Comment: RESULT CALLED TO, READ BACK BY AND VERIFIED WITH: RN K FILDS @2324  02/25/19 BY S GEZAHEGN (NOTE) If result is NEGATIVE SARS-CoV-2 target nucleic acids are  NOT DETECTED. The SARS-CoV-2 RNA is generally detectable in upper and lower  respiratory specimens during the acute phase of infection. The lowest  concentration of SARS-CoV-2 viral copies this assay can detect is 250  copies / mL. A negative result does not preclude SARS-CoV-2 infection  and should not be used as the sole basis for treatment or other  patient management decisions.  A negative result may occur with  improper specimen collection / handling, submission of specimen other  than nasopharyngeal swab, presence of viral mutation(s) within the  areas targeted by this assay, and inadequate number of viral copies  (<250 copies / mL). A negative result must be combined with clinical  observations, patient history, and epidemiological information. If result is POSITIVE SARS-CoV-2 target nucleic acids are DETECTED.  The SARS-CoV-2 RNA is generally detectable in upper and lower  respiratory specimens during the acute phase of infection.  Positive  results are indicative of active infection with SARS-CoV-2.  Clinical  correlation with patient history and other diagnostic information is  necessary to determine patient infection status.  Positive results do  not rule out bacterial infection or co-infection with other viruses. If result is PRESUMPTIVE POSTIVE SARS-CoV-2 nucleic acids MAY BE PRESENT.   A presumptive positive result was obtained on the submitted specimen  and confirmed on repeat testing.  While 2019 novel coronavirus  (SARS-CoV-2) nucleic acids may be present in the submitted sample  additional confirmatory testing may be necessary for epidemiological  and / or clinical management purposes  to differentiate between  SARS-CoV-2 and other Sarbecovirus currently known to infect humans.  If clinically indicated additional testing with an alternate test  methodology [phone])  is advised. The SARS-CoV-2 RNA is generally  detectable in upper and lower respiratory specimens  during the acute  phase of infection. The expected result is Negative. Fact Sheet for Patients:  StrictlyIdeas.no Fact Sheet for Healthcare Providers: BankingDealers.co.za This test is not yet approved or cleared by the [place] FDA and has been authorized for detection and/or diagnosis of SARS-CoV-2 by FDA under an Emergency Use Authorization (EUA).  This EUA will remain in effect (meaning this test can be used) for the duration of the COVID-19 declaration under Section 564(b)(1) of the Act, 21 U.S.C. section 360bbb-3(b)(1), unless the authorization is terminated or revoked sooner. Performed at [facility], [place] [address], [place], [place] [address]     Radiology Reports US Renal  Result Date: 02/26/2019 CLINICAL DATA:  Acute kidney injury, COVID positive EXAM: RENAL / URINARY TRACT ULTRASOUND COMPLETE COMPARISON:  None. FINDINGS: Right Kidney: Renal measurements: 8.8 x 4.7 x 5.1 cm = volume: 107 mL . Echogenicity within normal limits. No mass or hydronephrosis visualized. Left Kidney: Renal measurements: 10.4 x 5.3 x 5.6 cm = volume: 160 mL. Echogenicity within normal limits. No mass or hydronephrosis visualized. Bladder: Not discretely visualized/underdistended. IMPRESSION: Bilateral kidneys are within normal limits.  No hydronephrosis. Bladder is not discretely visualized. Electronically Signed   By: Julian Hy M.D.   On: 02/26/2019 02:58   Dg Chest Port 1 View  Result Date: 02/25/2019 CLINICAL DATA:  Syncope EXAM: PORTABLE CHEST 1 VIEW COMPARISON:  07/08/2012 FINDINGS: Minimal elevation of left diaphragm. Mild atelectasis at left base. No pleural effusion. Borderline heart size. No pneumothorax. IMPRESSION: Mild bibasilar atelectasis. Electronically Signed   By: Donavan Foil M.D.   On: 02/25/2019 21:45   Xr Hip Unilat W Or W/o Pelvis 1v Left  Result Date: 02/07/2019 An AP pelvis and lateral of the right hip shows a  total hip arthroplasty on the right side with no complicating features.  The AP view does show moderate arthritic changes in the left hip with joint space narrowing  Xr Lumbar Spine 2-3 Views  Result Date: 02/07/2019 An AP and lateral of the lumbar spine shows severe degenerative scoliosis with significant arthritic changes at multiple levels as well as disc space narrowing.   Time Spent in minutes 35   Sina Sumpter M.D on 02/26/2019 at 10:44 AM  Between 7am to 7pm - Pager - [phone]  After 7pm go to [email or web address] - password [facility]  [facility] -  Office  [phone]

## 2019-02-27 ENCOUNTER — Encounter (HOSPITAL_COMMUNITY): Payer: Self-pay | Admitting: Cardiology

## 2019-02-27 DIAGNOSIS — R5081 Fever presenting with conditions classified elsewhere: Secondary | ICD-10-CM

## 2019-02-27 LAB — FERRITIN: Ferritin: 298 ng/mL (ref 11–307)

## 2019-02-27 LAB — HEPATIC FUNCTION PANEL
ALT: 15 U/L (ref 0–44)
AST: 20 U/L (ref 15–41)
Albumin: 2.8 g/dL — ABNORMAL LOW (ref 3.5–5.0)
Alkaline Phosphatase: 62 U/L (ref 38–126)
Bilirubin, Direct: 0.2 mg/dL (ref 0.0–0.2)
Indirect Bilirubin: 0.5 mg/dL (ref 0.3–0.9)
Total Bilirubin: 0.7 mg/dL (ref 0.3–1.2)
Total Protein: 6.4 g/dL — ABNORMAL LOW (ref 6.5–8.1)

## 2019-02-27 LAB — BASIC METABOLIC PANEL
Anion gap: 10 (ref 5–15)
BUN: 18 mg/dL (ref 8–23)
CO2: 25 mmol/L (ref 22–32)
Calcium: 7.8 mg/dL — ABNORMAL LOW (ref 8.9–10.3)
Chloride: 109 mmol/L (ref 98–111)
Creatinine, Ser: 1.75 mg/dL — ABNORMAL HIGH (ref 0.44–1.00)
GFR calc Af Amer: 33 mL/min — ABNORMAL LOW (ref >60)
GFR calc non Af Amer: 29 mL/min — ABNORMAL LOW (ref >60)
Glucose, Bld: 91 mg/dL (ref 70–99)
Potassium: 3.5 mmol/L (ref 3.5–5.1)
Sodium: 144 mmol/L (ref 135–145)

## 2019-02-27 LAB — D-DIMER, QUANTITATIVE: D-Dimer, Quant: 2.77 ug/mL-FEU — ABNORMAL HIGH (ref 0.00–0.50)

## 2019-02-27 LAB — C-REACTIVE PROTEIN: CRP: 1.1 mg/dL — ABNORMAL HIGH (ref <1.0)

## 2019-02-27 LAB — GLUCOSE, CAPILLARY
Glucose-Capillary: 84 mg/dL (ref 70–99)
Glucose-Capillary: 153 mg/dL — ABNORMAL HIGH (ref 70–99)
Glucose-Capillary: 177 mg/dL — ABNORMAL HIGH (ref 70–99)
Glucose-Capillary: 197 mg/dL — ABNORMAL HIGH (ref 70–99)

## 2019-02-27 LAB — CBC
HCT: 35.2 % — ABNORMAL LOW (ref 36.0–46.0)
Hemoglobin: 11 g/dL — ABNORMAL LOW (ref 12.0–15.0)
MCH: 27.4 pg (ref 26.0–34.0)
MCHC: 31.3 g/dL (ref 30.0–36.0)
MCV: 87.6 fL (ref 80.0–100.0)
Platelets: 109 10*3/uL — ABNORMAL LOW (ref 150–400)
RBC: 4.02 MIL/uL (ref 3.87–5.11)
RDW: 12.8 % (ref 11.5–15.5)
WBC: 4.9 10*3/uL (ref 4.0–10.5)
nRBC: 0 % (ref 0.0–0.2)

## 2019-02-27 LAB — MAGNESIUM: Magnesium: 1.2 mg/dL — ABNORMAL LOW (ref 1.7–2.4)

## 2019-02-27 MED ORDER — DEXAMETHASONE 4 MG PO TABS
6.0000 mg | ORAL_TABLET | Freq: Every day | ORAL | Status: DC
  Administered 2019-02-27 – 2019-03-01 (×3): 6 mg via ORAL
  Filled 2019-02-27 (×3): qty 2

## 2019-02-27 MED ORDER — POLYETHYLENE GLYCOL 3350 17 G PO PACK
17.0000 g | PACK | Freq: Every day | ORAL | Status: DC | PRN
  Administered 2019-02-28: 17 g via ORAL
  Filled 2019-02-27: qty 1

## 2019-02-27 MED ORDER — ENOXAPARIN SODIUM 40 MG/0.4ML ~~LOC~~ SOLN
40.0000 mg | SUBCUTANEOUS | Status: DC
  Administered 2019-02-28 – 2019-03-01 (×2): 40 mg via SUBCUTANEOUS
  Filled 2019-02-27 (×2): qty 0.4

## 2019-02-27 MED ORDER — SODIUM CHLORIDE 0.9 % IV SOLN
INTRAVENOUS | Status: AC
  Administered 2019-02-27 – 2019-02-28 (×3): via INTRAVENOUS

## 2019-02-27 MED ORDER — DOCUSATE SODIUM 100 MG PO CAPS
200.0000 mg | ORAL_CAPSULE | Freq: Every day | ORAL | Status: DC
  Administered 2019-02-28 (×2): 200 mg via ORAL
  Filled 2019-02-27 (×2): qty 2

## 2019-02-27 NOTE — Progress Notes (Signed)
EEG findings were suggestive of cortical dysfunction in left temporal lobe. No seizures or epileptiform discharges were seen throughout the recording. The left temporal intermittent slowing is not an uncommon feature in elderly patients and no imaging work up is required for this.   Neurology will sign off. Please call if there are additional questions.   Electronically signed: Dr. Kerney Elbe

## 2019-02-27 NOTE — Progress Notes (Addendum)
PROGRESS NOTE                                                                                                                                                                                                             Patient Demographics:    Catherine Ramsey, is a 74 y.o. female, DOB - 26-May-1946, KYH:[id number]  Admit date - 02/25/2019   Admitting Physician Etta Quill, DO  Outpatient Primary MD for the patient is Leanna Battles, MD  LOS - 1  Outpatient Specialists none  Chief Complaint  Patient presents with   Loss of Consciousness       Brief Narrative 73 year old obese female with history of diabetes mellitus and hypertension presented with 2 episodes of syncope.  One episode was 2 days prior to admission and the next on the day of admission.  2 days prior she was sitting on the chair where her husband noticed her to be somnolent and frothing from her mouth and woke up after several minutes.  On the day of admission she was sitting on the chair next to her husband again when all of a sudden she passed out.  She does not remember how long she was gone for (thinks this was several minutes) but saw EMS sitting next to her when she woke up.  Denies any prodromal symptoms including lightheadedness, dizziness, blurred vision, headache, chest pain, palpitations.  No witnessed seizures, bowel or urinary incontinence.  Denies similar symptoms in the past.  Denies any new medications, recent illness, fevers, chills, nausea, vomiting, dysuria, diarrhea, weakness or numbness in her extremities.  Her niece was recently tested positive for COVID-19 and her mother is hospitalized with COVID-19 infection as well. In the ED vitals were stable.  She was hypokalemic with potassium of 3.2, noted to have acute kidney injury with creatinine of 2.45 and high-sensitivity troponin of 16.  EKG showed sinus bradycardia at 52 with no ST-T changes.   Patient was tested positive for COVID-19.    Subjective:  Noted to have fever of 101.4 F.  Heart rate frequently in the 40s on the monitor.  Denies any shortness of breath, chills or pain.   Assessment  & Plan :    Principal Problem: Syncope ([place]) Patient bradycardic on the monitor in the mid to high 40s.  Holding atenolol. Given elevated d-dimer VQ  scan was done which was negative for PE, heparin drip discontinued.  Head CT negative. EEG done given history of frothing from her mouth during the syncopal episode and was negative for epileptiform discharge with finding of cortical dysfunction in the left temporal lobe (per neurology is not an uncommon finding in elderly patient and no imaging work-up is needed). Consulted cardiology for further evaluation of her syncope and bradycardia.  (Further inpatient work-up versus outpatient event monitor).    AKI (acute kidney injury) ([place]) Suspect ATN with dehydration and NSAID use.  Patient reports taking multiple frequent ibuprofen for pain. Add IV NS. Avoid NSAIDs and nephrotoxic agents.  Renal function slowly improving this morning.  Continue IV fluids.  Renal ultrasound unremarkable.   Active Problems: COVID-19 infection. Patient has no fever or respiratory symptoms.  Did have fever of 101.4 F overnight.  Her niece and mother are both infected with COVID-19.  Will monitor closely.  Maintain airborne and contact isolation. Mildly elevated CRP, d-dimer trending down, normal ferritin. Does not require Actemra or Remdesivir.  Added Decadron.  Sinus bradycardia As outlined above.  Cardiology consulted.    DM2 (diabetes mellitus, type 2), uncontrolled with hyperglycemia (HCC) A1c of 9.  CBG has been in the normal range.  Continue increase Lantus dose and pre-meal coverage since I have added Decadron today.  Hyperlipidemia Continue statin   Essential hypertension   Hold atenolol given bradycardia  Thrombocytopenia Mild.  No signs of  bleeding.  Monitor for now.  Stable for transfer to [facility] if cleared by cardiology  Code Status : Full code  Family Communication  : None  Disposition Plan  : Possibly transfer to [facility] if cleared by cardiology.  Barriers For Discharge : Active symptoms  Consults  : Neurology (signed off), cardiology  Procedures  : CT head, 2D echo, EEG, VQ scan  DVT Prophylaxis  :  Lovenox -  Lab Results  Component Value Date   PLT 109 (L) 02/27/2019    Antibiotics  :  Anti-infectives (From admission, onward)   None        Objective:   Vitals:   02/27/19 0107 02/27/19 0400 02/27/19 0500 02/27/19 0814  BP:    (!) 142/75  Pulse:    (!) 50  Resp:    18  Temp: (!) 101.4 F (38.6 C) 98.4 F (36.9 C)  99.5 F (37.5 C)  TempSrc: Oral Oral  Oral  SpO2:    99%  Weight:   92.3 kg   Height:        Wt Readings from Last 3 Encounters:  02/27/19 92.3 kg  02/07/19 93 kg  12/07/16 86.2 kg     Intake/Output Summary (Last 24 hours) at 02/27/2019 0854 Last data filed at 02/27/2019 0800 Gross per 24 hour  Intake 1594.53 ml  Output 1800 ml  Net -205.47 ml    Physical exam Fatigued, not in distress HEENT: Moist mucosa, supple neck Chest: Clear CVs: S1-S2 bradycardic, no murmurs GI: Soft, nondistended, nontender Musculoskeletal: Warm, no edema     Data Review:    CBC Recent Labs  Lab 02/25/19 2121 02/27/19 0612  WBC 4.5 4.9  HGB 10.9* 11.0*  HCT 33.6* 35.2*  PLT 99* 109*  MCV 86.2 87.6  MCH 27.9 27.4  MCHC 32.4 31.3  RDW 13.1 12.8  LYMPHSABS 1.6  --   MONOABS 0.4  --   EOSABS 0.0  --   BASOSABS 0.0  --     Chemistries  Recent Labs  Lab 02/25/19 2121 02/26/19 0231 02/27/19 0612  NA 140 139 144  K 3.2* 3.7 3.5  CL 105 106 109  CO2 25 20* 25  GLUCOSE 142* 213* 91  BUN 18 21 18   CREATININE 2.45* 2.26* 1.75*  CALCIUM 8.1* 8.2* 7.8*  MG  --  1.4*  --   AST  --   --  20  ALT  --   --  15  ALKPHOS  --   --  62  BILITOT  --   --  0.7    ------------------------------------------------------------------------------------------------------------------ No results for input(s): CHOL, HDL, LDLCALC, TRIG, CHOLHDL, LDLDIRECT in the last 72 hours.  Lab Results  Component Value Date   HGBA1C 9.1 (H) 02/26/2019   ------------------------------------------------------------------------------------------------------------------ Recent Labs    02/26/19 0231  TSH 2.590   ------------------------------------------------------------------------------------------------------------------ Recent Labs    02/27/19 0612  FERRITIN 298    Coagulation profile No results for input(s): INR, PROTIME in the last 168 hours.  Recent Labs    02/26/19 0231 02/27/19 0612  DDIMER 4.83* 2.77*    Cardiac Enzymes No results for input(s): CKMB, TROPONINI, MYOGLOBIN in the last 168 hours.  Invalid input(s): CK ------------------------------------------------------------------------------------------------------------------ No results found for: BNP  Inpatient Medications  Scheduled Meds:  amLODipine  10 mg Oral Daily   enoxaparin (LOVENOX) injection  30 mg Subcutaneous Q24H   feeding supplement (ENSURE ENLIVE)  237 mL Oral BID BM   insulin aspart  0-15 Units Subcutaneous TID WC   insulin aspart  3 Units Subcutaneous TID WC   insulin glargine  20 Units Subcutaneous Daily   pravastatin  40 mg Oral QHS   sodium chloride flush  3 mL Intravenous Q12H   Continuous Infusions:  sodium chloride 100 mL/hr at 02/27/19 0847   PRN Meds:.acetaminophen **OR** acetaminophen, gabapentin, ondansetron **OR** ondansetron (ZOFRAN) IV  Micro Results Recent Results (from the past 240 hour(s))  SARS Coronavirus 2 (CEPHEID - Performed in [place] hospital lab), Hosp Order     Status: Abnormal   Collection Time: 02/25/19  9:32 PM   Specimen: Nasopharyngeal Swab  Result Value Ref Range Status   SARS Coronavirus 2 POSITIVE (A) NEGATIVE  Final    Comment: RESULT CALLED TO, READ BACK BY AND VERIFIED WITH: RN K FILDS @2324  02/25/19 BY S GEZAHEGN (NOTE) If result is NEGATIVE SARS-CoV-2 target nucleic acids are NOT DETECTED. The SARS-CoV-2 RNA is generally detectable in upper and lower  respiratory specimens during the acute phase of infection. The lowest  concentration of SARS-CoV-2 viral copies this assay can detect is 250  copies / mL. A negative result does not preclude SARS-CoV-2 infection  and should not be used as the sole basis for treatment or other  patient management decisions.  A negative result may occur with  improper specimen collection / handling, submission of specimen other  than nasopharyngeal swab, presence of viral mutation(s) within the  areas targeted by this assay, and inadequate number of viral copies  (<250 copies / mL). A negative result must be combined with clinical  observations, patient history, and epidemiological information. If result is POSITIVE SARS-CoV-2 target nucleic acids are DETECTED.  The SARS-CoV-2 RNA is generally detectable in upper and lower  respiratory specimens during the acute phase of infection.  Positive  results are indicative of active infection with SARS-CoV-2.  Clinical  correlation with patient history and other diagnostic information is  necessary to determine patient infection status.  Positive results do  not rule out bacterial infection or co-infection with  other viruses. If result is PRESUMPTIVE POSTIVE SARS-CoV-2 nucleic acids MAY BE PRESENT.   A presumptive positive result was obtained on the submitted specimen  and confirmed on repeat testing.  While 2019 novel coronavirus  (SARS-CoV-2) nucleic acids may be present in the submitted sample  additional confirmatory testing may be necessary for epidemiological  and / or clinical management purposes  to differentiate between  SARS-CoV-2 and other Sarbecovirus currently known to infect humans.  If clinically  indicated additional testing with an alternate test  methodology [phone])  is advised. The SARS-CoV-2 RNA is generally  detectable in upper and lower respiratory specimens during the acute  phase of infection. The expected result is Negative. Fact Sheet for Patients:  StrictlyIdeas.no Fact Sheet for Healthcare Providers: BankingDealers.co.za This test is not yet approved or cleared by the [place] FDA and has been authorized for detection and/or diagnosis of SARS-CoV-2 by FDA under an Emergency Use Authorization (EUA).  This EUA will remain in effect (meaning this test can be used) for the duration of the COVID-19 declaration under Section 564(b)(1) of the Act, 21 U.S.C. section 360bbb-3(b)(1), unless the authorization is terminated or revoked sooner. Performed at [facility], [place] [address] [place], [place] [address]     Radiology Reports Nm Pulmonary Perfusion  Result Date: 02/26/2019 CLINICAL DATA:  Initial evaluation for acute shortness of breath, history of COVID-19. EXAM: NUCLEAR MEDICINE PERFUSION LUNG SCAN TECHNIQUE: Perfusion images were obtained in multiple projections after intravenous injection of radiopharmaceutical. Ventilation scans intentionally deferred if perfusion scan and chest x-ray adequate for interpretation during COVID 19 epidemic. RADIOPHARMACEUTICALS:  1.56 mCi Tc-40m MAA IV COMPARISON:  Comparison made with prior radiograph 02/25/2019. FINDINGS: Perfusion only imaging demonstrates homogeneous and symmetric radiotracer signal throughout both lungs bilaterally. No peripheral wedge-shaped perfusion deficits to suggest acute pulmonary embolism. No other perfusion abnormality or other finding identified. IMPRESSION: Negative perfusion lung scan, with no evidence for acute pulmonary embolism. Electronically Signed   By: Jeannine Boga M.D.   On: 02/26/2019 19:22   US Renal  Result Date:  02/26/2019 CLINICAL DATA:  Acute kidney injury, COVID positive EXAM: RENAL / URINARY TRACT ULTRASOUND COMPLETE COMPARISON:  None. FINDINGS: Right Kidney: Renal measurements: 8.8 x 4.7 x 5.1 cm = volume: 107 mL . Echogenicity within normal limits. No mass or hydronephrosis visualized. Left Kidney: Renal measurements: 10.4 x 5.3 x 5.6 cm = volume: 160 mL. Echogenicity within normal limits. No mass or hydronephrosis visualized. Bladder: Not discretely visualized/underdistended. IMPRESSION: Bilateral kidneys are within normal limits.  No hydronephrosis. Bladder is not discretely visualized. Electronically Signed   By: Julian Hy M.D.   On: 02/26/2019 02:58   Dg Chest Port 1 View  Result Date: 02/25/2019 CLINICAL DATA:  Syncope EXAM: PORTABLE CHEST 1 VIEW COMPARISON:  07/08/2012 FINDINGS: Minimal elevation of left diaphragm. Mild atelectasis at left base. No pleural effusion. Borderline heart size. No pneumothorax. IMPRESSION: Mild bibasilar atelectasis. Electronically Signed   By: Donavan Foil M.D.   On: 02/25/2019 21:45   Xr Hip Unilat W Or W/o Pelvis 1v Left  Result Date: 02/07/2019 An AP pelvis and lateral of the right hip shows a total hip arthroplasty on the right side with no complicating features.  The AP view does show moderate arthritic changes in the left hip with joint space narrowing  Xr Lumbar Spine 2-3 Views  Result Date: 02/07/2019 An AP and lateral of the lumbar spine shows severe degenerative scoliosis with significant arthritic changes at multiple levels as  well as disc space narrowing.   Time Spent in minutes 25   Adiva Boettner M.D on 02/27/2019 at 8:54 AM  Between 7am to 7pm - Pager - [phone]  After 7pm go to [email or web address] - password [facility]  [facility] -  Office  [phone]

## 2019-02-27 NOTE — Consult Note (Addendum)
Cardiology Consultation:   Patient ID: Catherine Ramsey MRN: [id number]; DOB: [birth year]  Admit date: 02/25/2019 Date of Consult: 02/27/2019  Primary Care Provider: Leanna Battles, MD Primary Cardiologist: No primary care provider on file.  Primary Electrophysiologist:  None    Patient Profile:   Catherine Ramsey is a 73 y.o. female with a hx of diabetes type 2 and hypertension who is being seen today for the evaluation of syncope at the request of Dr. Clementeen Ramsey.  History of Present Illness:   [name] has a history of type 2 diabetes and hypertension.  She presented to [facility] for evaluation after 2 episodes of syncope.  In the ED she apparently denied any new medications, recent illness, fevers, chills, nausea, vomiting, dysuria, diarrhea, weakness or numbness in her extremities.  Her niece was recently tested positive for COVID-19 and her mother is hospitalized with COVID-19 infection as well.  In the ED her vital signs were stable.  She was hypokalemic with potassium of 3.2, noted to have acute kidney injury with creatinine of 2.45 and high-sensitivity troponin of 16.  EKG showed sinus bradycardia at 52 bpm with no ST-T changes.  The patient tested positive for COVID-19.  She was noted to have a fever of 101.4.  It was noted that her heart rate frequently dropped into the 40s on telemetry.  Cardiology is asked to evaluate the patient for syncope.  There is mention of possible transfer to [facility] with cardiology. She had been on atenolol 50 mg twice daily which has now been discontinued.  I called into the patient's room to assess her over the phone given positive COVID 19. She denies any prior cardiac history. She says that over the last week she has had poor appetite and has not been eating or drinking much at all. She has also been taking Ibuprofen 400 mg at least 2 times per day for back pain related to DDD. On Friday she had been out running errands in the  heat. When she returned she went in to sit at her kitchen table. She became unresopnsive to her husband. He shook and called her name to finally get her to arouse. Again on Sunday she was out in the heat taking food to her sick family member and did not use the air conditioner in her car. She sat in her car for several minutes upon returning and then went into the house and sat down in her living room. She again became unresponsive. Her husband could not arouse her he noted a faint pulse. He called 911. She does not recall any prodromal chest pain, shortness of breath, lightheadedness, palpitations. She woke with the paramedics around her.   She feels better now since being hydrated. She does feel that she was probably very dehydrated, made worse by being active out in the extreme heat.   She does not smoke or drink alcohol.    Heart Pathway Score:     Past Medical History:  Diagnosis Date   Achalasia - Type II 05/08/2010   Qualifier: Diagnosis of  By: Carlean Purl MD, Tonna Boehringer E    Candida esophagitis ([place]) 03/26/2014   Diabetes mellitus    type 2   Family history of adverse reaction to anesthesia    son hard to wake up   Hypertension     Past Surgical History:  Procedure Laterality Date   ABDOMINAL HYSTERECTOMY     BREAST BIOPSY     left/benign  ESOPHAGEAL MANOMETRY N/A 04/01/2014   Procedure: ESOPHAGEAL MANOMETRY (EM);  Surgeon: Gatha Mayer, MD;  Location: [facility];  Service: Endoscopy;  Laterality: N/A;   HELLER MYOTOMY N/A 12/07/2016   Procedure: LAPAROSCOPIC HELLER MYOTOMY, DOR FUNDOPLICATION, HIATAL HERNIA REPAIR, INTRAOPERATIVE UPPER ENDOSCOPY;  Surgeon: Jackolyn Confer, MD;  Location: [facility];  Service: General;  Laterality: N/A;   TOTAL HIP ARTHROPLASTY     right pin then replacement     Home Medications:  Prior to Admission medications   Medication Sig Start Date End Date Taking? Authorizing Provider  amLODipine (NORVASC) 10 MG tablet Take 10 mg by mouth  daily.   Yes [provider]  atenolol (TENORMIN) 50 MG tablet Take 50 mg by mouth 2 (two) times daily.   Yes [provider]  gabapentin (NEURONTIN) 300 MG capsule Take 300 mg by mouth daily as needed (pain).   Yes [provider]  insulin aspart (NOVOLOG FLEXPEN) 100 UNIT/ML FlexPen Inject 1-10 Units into the skin 3 (three) times daily with meals. Sliding scale   Yes [provider]  insulin glargine (LANTUS) 100 UNIT/ML injection Inject 20 Units into the skin daily.   Yes [provider]  KLOR-CON M20 20 MEQ tablet Take 20 mEq by mouth daily. 09/06/17  Yes [provider]  losartan-hydrochlorothiazide (HYZAAR) 100-25 MG tablet Take 1 tablet by mouth daily.   Yes [provider]  pravastatin (PRAVACHOL) 40 MG tablet Take 40 mg by mouth at bedtime. 12/02/18  Yes [provider]    Inpatient Medications: Scheduled Meds:  amLODipine  10 mg Oral Daily   dexamethasone  6 mg Oral Daily   enoxaparin (LOVENOX) injection  30 mg Subcutaneous Q24H   feeding supplement (ENSURE ENLIVE)  237 mL Oral BID BM   insulin aspart  0-15 Units Subcutaneous TID WC   insulin aspart  3 Units Subcutaneous TID WC   insulin glargine  20 Units Subcutaneous Daily   pravastatin  40 mg Oral QHS   sodium chloride flush  3 mL Intravenous Q12H   Continuous Infusions:  PRN Meds: acetaminophen **OR** acetaminophen, gabapentin, ondansetron **OR** ondansetron (ZOFRAN) IV  Allergies:   No Known Allergies  Social History:   Social History   Socioeconomic History   Marital status: Married    Spouse name: Not on file   Number of children: Not on file   Years of education: Not on file   Highest education level: Not on file  Occupational History   Not on file  Social Needs   Financial resource strain: Not on file   Food insecurity    Worry: Not on file    Inability: Not on file   Transportation needs    Medical: Not on file     Non-medical: Not on file  Tobacco Use   Smoking status: Never Smoker   Smokeless tobacco: Never Used  Substance and Sexual Activity   Alcohol use: No   Drug use: No   Sexual activity: Yes  Lifestyle   Physical activity    Days per week: Not on file    Minutes per session: Not on file   Stress: Not on file  Relationships   Social connections    Talks on phone: Not on file    Gets together: Not on file    Attends religious service: Not on file    Active member of club or organization: Not on file    Attends meetings of clubs or organizations: Not on file  Relationship status: Not on file   Intimate partner violence    Fear of current or ex partner: Not on file    Emotionally abused: Not on file    Physically abused: Not on file    Forced sexual activity: Not on file  Other Topics Concern   Not on file  Social History Narrative   Not on file    Family History:    Family History  Problem Relation Age of Onset   Diabetes Mother    Hypertension Mother    Hyperlipidemia Mother    Diabetes Father    Hypertension Father    Hypertension Sister    Hypertension Sister      ROS:  Please see the history of present illness.   All other ROS reviewed and negative.     Physical Exam/Data:   Vitals:   02/27/19 0107 02/27/19 0400 02/27/19 0500 02/27/19 0814  BP:    (!) 142/75  Pulse:    (!) 50  Resp:    18  Temp: (!) 101.4 F (38.6 C) 98.4 F (36.9 C)  99.5 F (37.5 C)  TempSrc: Oral Oral  Oral  SpO2:    99%  Weight:   92.3 kg   Height:        Intake/Output Summary (Last 24 hours) at 02/27/2019 1352 Last data filed at 02/27/2019 0800 Gross per 24 hour  Intake 1354.53 ml  Output 1500 ml  Net -145.47 ml   Last 3 Weights 02/27/2019 02/26/2019 02/25/2019  Weight (lbs) 203 lb 7.8 oz 199 lb 11.8 oz 205 lb  Weight (kg) 92.3 kg 90.6 kg 92.987 kg     Body mass index is 31.87 kg/m.   Physical exam per MD  EKG:  The EKG was personally reviewed and  demonstrates:  Sinus bradycardia, 52 bpm, with PACs, QTC 398 Telemetry:  Telemetry was personally reviewed and demonstrates:  Sinus rhythm, 70's, freq PACs  Relevant CV Studies:  Echocardiogram 02/26/2019 IMPRESSIONS   1. The left ventricle has normal systolic function with an ejection fraction of 60-65%. The cavity size was normal. Left ventricular diastolic Doppler parameters are consistent with impaired relaxation.  2. The right ventricle has normal systolic function. The cavity was normal. There is no increase in right ventricular wall thickness.  3. Left atrial size was moderately dilated.   Laboratory Data:  High Sensitivity Troponin:   Recent Labs  Lab 02/25/19 2121 02/26/19 0231  TROPONINIHS 16 14     Cardiac EnzymesNo results for input(s): TROPONINI in the last 168 hours. No results for input(s): TROPIPOC in the last 168 hours.  Chemistry Recent Labs  Lab 02/25/19 2121 02/26/19 0231 02/27/19 0612  NA 140 139 144  K 3.2* 3.7 3.5  CL 105 106 109  CO2 25 20* 25  GLUCOSE 142* 213* 91  BUN 18 21 18   CREATININE 2.45* 2.26* 1.75*  CALCIUM 8.1* 8.2* 7.8*  GFRNONAA 19* 21* 29*  GFRAA 22* 24* 33*  ANIONGAP 10 13 10     Recent Labs  Lab 02/27/19 0612  PROT 6.4*  ALBUMIN 2.8*  AST 20  ALT 15  ALKPHOS 62  BILITOT 0.7   Hematology Recent Labs  Lab 02/25/19 2121 02/27/19 0612  WBC 4.5 4.9  RBC 3.90 4.02  HGB 10.9* 11.0*  HCT 33.6* 35.2*  MCV 86.2 87.6  MCH 27.9 27.4  MCHC 32.4 31.3  RDW 13.1 12.8  PLT 99* 109*   BNPNo results for input(s): BNP, PROBNP in the last 168 hours.  DDimer  Recent Labs  Lab 02/26/19 0231 02/27/19 0612  DDIMER 4.83* 2.77*     Radiology/Studies:  Nm Pulmonary Perfusion  Result Date: 02/26/2019 CLINICAL DATA:  Initial evaluation for acute shortness of breath, history of COVID-19. EXAM: NUCLEAR MEDICINE PERFUSION LUNG SCAN TECHNIQUE: Perfusion images were obtained in multiple projections after intravenous injection of  radiopharmaceutical. Ventilation scans intentionally deferred if perfusion scan and chest x-ray adequate for interpretation during COVID 19 epidemic. RADIOPHARMACEUTICALS:  1.56 mCi Tc-81m MAA IV COMPARISON:  Comparison made with prior radiograph 02/25/2019. FINDINGS: Perfusion only imaging demonstrates homogeneous and symmetric radiotracer signal throughout both lungs bilaterally. No peripheral wedge-shaped perfusion deficits to suggest acute pulmonary embolism. No other perfusion abnormality or other finding identified. IMPRESSION: Negative perfusion lung scan, with no evidence for acute pulmonary embolism. Electronically Signed   By: Jeannine Boga M.D.   On: 02/26/2019 19:22   US Renal  Result Date: 02/26/2019 CLINICAL DATA:  Acute kidney injury, COVID positive EXAM: RENAL / URINARY TRACT ULTRASOUND COMPLETE COMPARISON:  None. FINDINGS: Right Kidney: Renal measurements: 8.8 x 4.7 x 5.1 cm = volume: 107 mL . Echogenicity within normal limits. No mass or hydronephrosis visualized. Left Kidney: Renal measurements: 10.4 x 5.3 x 5.6 cm = volume: 160 mL. Echogenicity within normal limits. No mass or hydronephrosis visualized. Bladder: Not discretely visualized/underdistended. IMPRESSION: Bilateral kidneys are within normal limits.  No hydronephrosis. Bladder is not discretely visualized. Electronically Signed   By: Julian Hy M.D.   On: 02/26/2019 02:58   Dg Chest Port 1 View  Result Date: 02/25/2019 CLINICAL DATA:  Syncope EXAM: PORTABLE CHEST 1 VIEW COMPARISON:  07/08/2012 FINDINGS: Minimal elevation of left diaphragm. Mild atelectasis at left base. No pleural effusion. Borderline heart size. No pneumothorax. IMPRESSION: Mild bibasilar atelectasis. Electronically Signed   By: Donavan Foil M.D.   On: 02/25/2019 21:45    Assessment and Plan:   Syncope -Pt with 2 episodes of syncope in the setting of COVID 19 infection and decreased oral intake and dehydration with AKI. Pt had been out in  the heat just prior to episodes.  -Pt noted to be in sinus bradycardia. Patient previously on atenolol 50 mg twice daily, currently on hold. Heart rates now improved in the 70's, with PACs. -Echocardiogram done yesterday showed normal LV systolic function with EF 65-99%, diastolic dysfunction, moderately dilated left atrium.  No increase in LV or RV wall thickness.  No significant valvular abnormalities. -Neurology has seen. EEG suggestive of cortical dysfunction in left temporal lobe. No seizures or epileptiform discharges were seen throughout the recording. Recommended hydration for volume depletion and have signed off.  -Symptoms likely related to COVID 19 infection and volume depletion.  -Pt feels better after IV hydration.  -No need for further cardiac evaluation at this time.  -Continue to hold beta blocker and follow up with PCP as outpatient. -Pt is cleared for transfer to [facility] if needed.    COVID-19 infection -Patient with elevated d-dimer but negative for PE on VQ scan.  Likely related to COVID 19 infection.  Mildly elevated CRP, normal ferritin. -Patient has had fever up to 101.4. -Chest x-ray with mild bibasilar atelectasis. -Management per primary team.  Decadron has been added.  Not felt to require Actemra or Remdesivir. -Pt's mother is hospitalized with COVID 19, has just been transferred to [facility].   Acute kidney injury -Baseline serum creatinine appears to be about 1.  Serum creatinine 2.45 on presentation.  Patient has been hydrated.  Serum creatinine  1.75 today. -Suspected ATN with dehydration and NSAID use.  Patient had reported taking multiple frequent ibuprofen doses. She is aware to avoid NSAIDS from this point on.  -Continue close monitoring of renal function  Hypertension -Home medications include amlodipine 10 mg daily, atenolol 50 mg twice daily losartan-hydrochlorothiazide 100-25 mg daily.   -Losartan-hydrochlorothiazide on hold due to renal function,  atenolol on hold due to bradycardia.  Continues on amlodipine. -Blood pressures mildly elevated 140s-150s/70s-80s -Continue to hold meds and follow up outpatient.   Hypokalemia -Potassium 3.2 on presentation.  Potassium replaced. 3.5 today.  Diabetes type 2 on insulin -Patient on Lantus at home. A1c 9.1, poorly controlled.   Hyperlipidemia -On pravastatin 40 mg daily. Followed by PCP.    For questions or updates, please contact [place] Please consult [email or web address] for contact info under     Signed, Daune Perch, NP  02/27/2019 1:52 PM  I have seen and examined the patient along with Daune Perch, NP.  I have reviewed the chart, notes and new data.  I agree with NP's note.  Key new complaints: feeling much better Key examination changes: remote consult to avoid coronavirus dissemination/PPE use. Key new findings / data: Echo normal except for LA dilation and grade I LV diastolic dysfunction; AKI, improving. No ischemic ECG changes. Frequent PACs on telemetry, otw normal rhythm  PLAN: Syncope history is strongly suggestive of hypotension (occurred while upright, decreased PO intake for days, extreme heat, multiple antiHTNive meds including diuretics and beta blockers). No structural cardiac illness to cause syncope. No meaningful arrhythmia detected.  Hold losartan-HCTZ until BP and renal function returns to baseline; hold beta blocker until BP increases and avoid doses > 50 mg daily (if BP not well controlled consider switching to a vasodilating beta blocker such as carvedilol or nebivolol).  Will arrange appt in 2-3 weeks to review BP and renal function labs.  Sanda Klein, MD, [place] [phone] 02/27/2019, 3:07 PM

## 2019-02-28 ENCOUNTER — Ambulatory Visit: Payer: Medicare HMO | Admitting: Orthopaedic Surgery

## 2019-02-28 DIAGNOSIS — N179 Acute kidney failure, unspecified: Secondary | ICD-10-CM

## 2019-02-28 LAB — BLOOD CULTURE ID PANEL (REFLEXED)

## 2019-02-28 LAB — MAGNESIUM: Magnesium: 1 mg/dL — ABNORMAL LOW (ref 1.7–2.4)

## 2019-02-28 LAB — COMPREHENSIVE METABOLIC PANEL
ALT: 18 U/L (ref 0–44)
AST: 23 U/L (ref 15–41)
Albumin: 3.1 g/dL — ABNORMAL LOW (ref 3.5–5.0)
Alkaline Phosphatase: 72 U/L (ref 38–126)
Anion gap: 11 (ref 5–15)
BUN: 19 mg/dL (ref 8–23)
CO2: 23 mmol/L (ref 22–32)
Calcium: 8.1 mg/dL — ABNORMAL LOW (ref 8.9–10.3)
Chloride: 108 mmol/L (ref 98–111)
Creatinine, Ser: 1.37 mg/dL — ABNORMAL HIGH (ref 0.44–1.00)
GFR calc Af Amer: 45 mL/min — ABNORMAL LOW (ref >60)
GFR calc non Af Amer: 39 mL/min — ABNORMAL LOW (ref >60)
Glucose, Bld: 175 mg/dL — ABNORMAL HIGH (ref 70–99)
Potassium: 4.1 mmol/L (ref 3.5–5.1)
Sodium: 142 mmol/L (ref 135–145)
Total Bilirubin: 0.9 mg/dL (ref 0.3–1.2)
Total Protein: 7.5 g/dL (ref 6.5–8.1)

## 2019-02-28 LAB — CBC
HCT: 39.4 % (ref 36.0–46.0)
Hemoglobin: 12.5 g/dL (ref 12.0–15.0)
MCH: 27.5 pg (ref 26.0–34.0)
MCHC: 31.7 g/dL (ref 30.0–36.0)
MCV: 86.6 fL (ref 80.0–100.0)
Platelets: 180 10*3/uL (ref 150–400)
RBC: 4.55 MIL/uL (ref 3.87–5.11)
RDW: 12.6 % (ref 11.5–15.5)
WBC: 6.4 10*3/uL (ref 4.0–10.5)
nRBC: 0 % (ref 0.0–0.2)

## 2019-02-28 LAB — FERRITIN: Ferritin: 350 ng/mL — ABNORMAL HIGH (ref 11–307)

## 2019-02-28 LAB — GLUCOSE, CAPILLARY
Glucose-Capillary: 115 mg/dL — ABNORMAL HIGH (ref 70–99)
Glucose-Capillary: 229 mg/dL — ABNORMAL HIGH (ref 70–99)
Glucose-Capillary: 312 mg/dL — ABNORMAL HIGH (ref 70–99)
Glucose-Capillary: 306 mg/dL — ABNORMAL HIGH (ref 70–99)

## 2019-02-28 LAB — TROPONIN I (HIGH SENSITIVITY): Troponin I (High Sensitivity): 8 ng/L (ref <18)

## 2019-02-28 LAB — LACTATE DEHYDROGENASE: LDH: 246 U/L — ABNORMAL HIGH (ref 98–192)

## 2019-02-28 LAB — C-REACTIVE PROTEIN: CRP: 0.9 mg/dL (ref <1.0)

## 2019-02-28 LAB — D-DIMER, QUANTITATIVE: D-Dimer, Quant: 3.74 ug/mL-FEU — ABNORMAL HIGH (ref 0.00–0.50)

## 2019-02-28 MED ORDER — ATENOLOL 25 MG PO TABS
25.0000 mg | ORAL_TABLET | Freq: Every day | ORAL | Status: DC
  Administered 2019-02-28 – 2019-03-01 (×2): 25 mg via ORAL
  Filled 2019-02-28 (×2): qty 1

## 2019-02-28 MED ORDER — INSULIN ASPART 100 UNIT/ML ~~LOC~~ SOLN
4.0000 [IU] | Freq: Once | SUBCUTANEOUS | Status: AC
  Administered 2019-02-28: 4 [IU] via SUBCUTANEOUS

## 2019-02-28 NOTE — Progress Notes (Addendum)
PROGRESS NOTE                                                                                                                                                                                                             Patient Demographics:    Catherine Ramsey, is a 73 y.o. female, DOB - 06/15/1946, YPP:[id number]  Admit date - 02/25/2019   Admitting Physician Etta Quill, DO  Outpatient Primary MD for the patient is Leanna Battles, MD  LOS - 2  Outpatient Specialists none  Chief Complaint  Patient presents with  . Loss of Consciousness       Brief Narrative 64 aaf diabetes mellitus and hypertension presented with 2 episodes of syncope.   One episode was 2 days prior to admission and the next on the day of admission.  2 days prior she was sitting on the chair where her husband noticed her to be somnolent and frothing from her mouth and woke up after several minutes  On the day of admission she was sitting on the chair next to her husband again when all of a sudden she passed out.  She does not remember how long she was gone for (thinks this was several minutes) Denies any prodromal symptoms including lightheadedness, dizziness, blurred vision, headache, chest pain, palpitations.  No witnessed seizures, bowel or urinary incontinence.  Denies similar symptoms in the past.     Her niece was recently tested positive for COVID-19 and her mother is hospitalized with COVID-19 infection as well.  hypokalemic with potassium of 3.2,acute kidney injury with creatinine of 2.45 and high-sensitivity troponin of 16.   EKG showed sinus bradycardia at 52 with no ST-T changes.    Patient was tested positive for COVID-19.    Subjective:   Overall she feels fair there is no distress she is eating and drinking no chest pain no fever She is asking me about whether she can go home her heart rate is in the 60 range without significant  issues She has been moving around the unit and she does not require oxygen   Assessment  & Plan :   Syncope ([place]) Initially bradycardic  VQ scan negative for PE, heparin drip discontinued.  Head CT negative. EEG  negative for epileptiform discharge with finding of cortical dysfunction in the left temporal lobe (per neurology is  not an uncommon finding in elderly patient and no imaging work-up is needed). Cardiology has seen the patient made recommendations with regards to meds-amlodipine 10, atenolol 25 which can be increased in 1 week to 50 if blood pressure is high    AKI (acute kidney injury) ([place]) Suspect ATN with dehydration and NSAID use.  Patient reports taking multiple frequent ibuprofen for pain Continue IV fluids next 24 hours and if stabilizes can DC  Renal ultrasound unremarkable.  COVID-19 infection. Patient has no fever or respiratory symptoms.  Fever 101.4---7/28-follow temperature curve overnight Repeat all biomarkers LDH 246, ferritin 350 CRP 0.9 Does not require Actemra or Remdesivir.  Decadron 6 mg daily with taper on discharge  Sinus bradycardia As outlined above.  Cardiology consulted and have made recommendations    DM2 (diabetes mellitus, type 2), uncontrolled with hyperglycemia ([place]) A1c of 9.  CBG ranging 1 15-1 97 Continue Decadron 6 mg  Hyperlipidemia Continue statin  Essential hypertension As per above  Thrombocytopenia Mild.  No signs of bleeding.  Monitor for now.  Coag negative staph in 1 bottle Likely a contaminant-no further treatment at this time  Stable probably for discharge home in the next 24 to 48 hours with long taper of Decadron  Code Status : Full code  Family Communication  : None  Disposition Plan  : Likely will discharge home in a.m. if all fine  Barriers For Discharge : Active symptoms  Consults  : Neurology (signed off), cardiology signed off 7/29  Procedures  : CT head, 2D echo, EEG, VQ scan  DVT Prophylaxis  :   Lovenox -  Lab Results  Component Value Date   PLT 109 (L) 02/27/2019    Antibiotics  :  Anti-infectives (From admission, onward)   None        Objective:   Vitals:   02/27/19 0814 02/27/19 1639 02/28/19 0017 02/28/19 0807  BP: (!) 142/75 (!) 158/76 (!) 166/76 (!) 177/84  Pulse: (!) 50 73  (!) 58  Resp: 18 18 18 16   Temp: 99.5 F (37.5 C) 98.8 F (37.1 C) 98.7 F (37.1 C) 98.5 F (36.9 C)  TempSrc: Oral Oral Oral Oral  SpO2: 99% 97% 96% 100%  Weight:      Height:        Wt Readings from Last 3 Encounters:  02/27/19 92.3 kg  02/07/19 93 kg  12/07/16 86.2 kg     Intake/Output Summary (Last 24 hours) at 02/28/2019 0823 Last data filed at 02/28/2019 0818 Gross per 24 hour  Intake 1133.33 ml  Output 900 ml  Net 233.33 ml    Physical exam Awake coherent thick neck Mallampati 2 Chest clear but limited exam Abdomen soft nontender no rebound no guarding No lower extremity edema Moderate dentition S1-S2 no murmur rub or gallop    Data Review:    CBC Recent Labs  Lab 02/25/19 2121 02/27/19 0612  WBC 4.5 4.9  HGB 10.9* 11.0*  HCT 33.6* 35.2*  PLT 99* 109*  MCV 86.2 87.6  MCH 27.9 27.4  MCHC 32.4 31.3  RDW 13.1 12.8  LYMPHSABS 1.6  --   MONOABS 0.4  --   EOSABS 0.0  --   BASOSABS 0.0  --     Chemistries  Recent Labs  Lab 02/25/19 2121 02/26/19 0231 02/27/19 0612  NA 140 139 144  K 3.2* 3.7 3.5  CL 105 106 109  CO2 25 20* 25  GLUCOSE 142* 213* 91  BUN 18 21 18  CREATININE 2.45* 2.26* 1.75*  CALCIUM 8.1* 8.2* 7.8*  MG  --  1.4* 1.2*  AST  --   --  20  ALT  --   --  15  ALKPHOS  --   --  62  BILITOT  --   --  0.7   ------------------------------------------------------------------------------------------------------------------ No results for input(s): CHOL, HDL, LDLCALC, TRIG, CHOLHDL, LDLDIRECT in the last 72 hours.  Lab Results  Component Value Date   HGBA1C 9.1 (H) 02/26/2019    ------------------------------------------------------------------------------------------------------------------ Recent Labs    02/26/19 0231  TSH 2.590   ------------------------------------------------------------------------------------------------------------------ Recent Labs    02/27/19 0612  FERRITIN 298    Coagulation profile No results for input(s): INR, PROTIME in the last 168 hours.  Recent Labs    02/26/19 0231 02/27/19 0612  DDIMER 4.83* 2.77*    Cardiac Enzymes No results for input(s): CKMB, TROPONINI, MYOGLOBIN in the last 168 hours.  Invalid input(s): CK ------------------------------------------------------------------------------------------------------------------ No results found for: BNP  Inpatient Medications  Scheduled Meds: . amLODipine  10 mg Oral Daily  . atenolol  25 mg Oral Daily  . dexamethasone  6 mg Oral Daily  . docusate sodium  200 mg Oral QHS  . enoxaparin (LOVENOX) injection  40 mg Subcutaneous Q24H  . feeding supplement (ENSURE ENLIVE)  237 mL Oral BID BM  . insulin aspart  0-15 Units Subcutaneous TID WC  . insulin aspart  3 Units Subcutaneous TID WC  . insulin glargine  20 Units Subcutaneous Daily  . pravastatin  40 mg Oral QHS  . sodium chloride flush  3 mL Intravenous Q12H   Continuous Infusions: . sodium chloride 100 mL/hr at 02/27/19 2136   PRN Meds:.acetaminophen **OR** acetaminophen, gabapentin, ondansetron **OR** ondansetron (ZOFRAN) IV, polyethylene glycol  Micro Results Recent Results (from the past 240 hour(s))  SARS Coronavirus 2 (CEPHEID - Performed in [place] hospital lab), Hosp Order     Status: Abnormal   Collection Time: 02/25/19  9:32 PM   Specimen: Nasopharyngeal Swab  Result Value Ref Range Status   SARS Coronavirus 2 POSITIVE (A) NEGATIVE Final    Comment: RESULT CALLED TO, READ BACK BY AND VERIFIED WITH: RN K FILDS @2324  02/25/19 BY S GEZAHEGN (NOTE) If result is NEGATIVE SARS-CoV-2 target  nucleic acids are NOT DETECTED. The SARS-CoV-2 RNA is generally detectable in upper and lower  respiratory specimens during the acute phase of infection. The lowest  concentration of SARS-CoV-2 viral copies this assay can detect is 250  copies / mL. A negative result does not preclude SARS-CoV-2 infection  and should not be used as the sole basis for treatment or other  patient management decisions.  A negative result may occur with  improper specimen collection / handling, submission of specimen other  than nasopharyngeal swab, presence of viral mutation(s) within the  areas targeted by this assay, and inadequate number of viral copies  (<250 copies / mL). A negative result must be combined with clinical  observations, patient history, and epidemiological information. If result is POSITIVE SARS-CoV-2 target nucleic acids are DETECTED.  The SARS-CoV-2 RNA is generally detectable in upper and lower  respiratory specimens during the acute phase of infection.  Positive  results are indicative of active infection with SARS-CoV-2.  Clinical  correlation with patient history and other diagnostic information is  necessary to determine patient infection status.  Positive results do  not rule out bacterial infection or co-infection with other viruses. If result is PRESUMPTIVE POSTIVE SARS-CoV-2 nucleic acids MAY BE PRESENT.  A presumptive positive result was obtained on the submitted specimen  and confirmed on repeat testing.  While 2019 novel coronavirus  (SARS-CoV-2) nucleic acids may be present in the submitted sample  additional confirmatory testing may be necessary for epidemiological  and / or clinical management purposes  to differentiate between  SARS-CoV-2 and other Sarbecovirus currently known to infect humans.  If clinically indicated additional testing with an alternate test  methodology [phone])  is advised. The SARS-CoV-2 RNA is generally  detectable in upper and lower  respiratory specimens during the acute  phase of infection. The expected result is Negative. Fact Sheet for Patients:  StrictlyIdeas.no Fact Sheet for Healthcare Providers: BankingDealers.co.za This test is not yet approved or cleared by the [place] FDA and has been authorized for detection and/or diagnosis of SARS-CoV-2 by FDA under an Emergency Use Authorization (EUA).  This EUA will remain in effect (meaning this test can be used) for the duration of the COVID-19 declaration under Section 564(b)(1) of the Act, 21 U.S.C. section 360bbb-3(b)(1), unless the authorization is terminated or revoked sooner. Performed at [facility], [place] [address], [place], [place] [address]   Culture, blood (routine x 2)     Status: None (Preliminary result)   Collection Time: 02/27/19  3:38 PM   Specimen: BLOOD  Result Value Ref Range Status   Specimen Description BLOOD LEFT ANTECUBITAL  Final   Special Requests   Final    BOTTLES DRAWN AEROBIC ONLY Blood Culture adequate volume   Culture   Final    NO GROWTH < 24 HOURS Performed at [facility], [place] [address] [place], [place] [address]    Report Status PENDING  Incomplete  Culture, blood (routine x 2)     Status: None (Preliminary result)   Collection Time: 02/27/19  3:50 PM   Specimen: BLOOD  Result Value Ref Range Status   Specimen Description BLOOD LEFT ANTECUBITAL  Final   Special Requests   Final    BOTTLES DRAWN AEROBIC ONLY Blood Culture adequate volume   Culture   Final    NO GROWTH < 24 HOURS Performed at [facility], [place] [address], [place], [place] [address]    Report Status PENDING  Incomplete    Radiology Reports Nm Pulmonary Perfusion  Result Date: 02/26/2019 CLINICAL DATA:  Initial evaluation for acute shortness of breath, history of COVID-19. EXAM: NUCLEAR MEDICINE PERFUSION LUNG SCAN TECHNIQUE: Perfusion images were obtained in multiple projections  after intravenous injection of radiopharmaceutical. Ventilation scans intentionally deferred if perfusion scan and chest x-ray adequate for interpretation during COVID 19 epidemic. RADIOPHARMACEUTICALS:  1.56 mCi Tc-53m MAA IV COMPARISON:  Comparison made with prior radiograph 02/25/2019. FINDINGS: Perfusion only imaging demonstrates homogeneous and symmetric radiotracer signal throughout both lungs bilaterally. No peripheral wedge-shaped perfusion deficits to suggest acute pulmonary embolism. No other perfusion abnormality or other finding identified. IMPRESSION: Negative perfusion lung scan, with no evidence for acute pulmonary embolism. Electronically Signed   By: Jeannine Boga M.D.   On: 02/26/2019 19:22   US Renal  Result Date: 02/26/2019 CLINICAL DATA:  Acute kidney injury, COVID positive EXAM: RENAL / URINARY TRACT ULTRASOUND COMPLETE COMPARISON:  None. FINDINGS: Right Kidney: Renal measurements: 8.8 x 4.7 x 5.1 cm = volume: 107 mL . Echogenicity within normal limits. No mass or hydronephrosis visualized. Left Kidney: Renal measurements: 10.4 x 5.3 x 5.6 cm = volume: 160 mL. Echogenicity within normal limits. No mass or hydronephrosis visualized. Bladder: Not discretely visualized/underdistended.  IMPRESSION: Bilateral kidneys are within normal limits.  No hydronephrosis. Bladder is not discretely visualized. Electronically Signed   By: Julian Hy M.D.   On: 02/26/2019 02:58   Dg Chest Port 1 View  Result Date: 02/25/2019 CLINICAL DATA:  Syncope EXAM: PORTABLE CHEST 1 VIEW COMPARISON:  07/08/2012 FINDINGS: Minimal elevation of left diaphragm. Mild atelectasis at left base. No pleural effusion. Borderline heart size. No pneumothorax. IMPRESSION: Mild bibasilar atelectasis. Electronically Signed   By: Donavan Foil M.D.   On: 02/25/2019 21:45   Xr Hip Unilat W Or W/o Pelvis 1v Left  Result Date: 02/07/2019 An AP pelvis and lateral of the right hip shows a total hip arthroplasty on the  right side with no complicating features.  The AP view does show moderate arthritic changes in the left hip with joint space narrowing  Xr Lumbar Spine 2-3 Views  Result Date: 02/07/2019 An AP and lateral of the lumbar spine shows severe degenerative scoliosis with significant arthritic changes at multiple levels as well as disc space narrowing.   Time Spent in minutes [place], MD [facility] 8:24 AM   [facility] -  Office  [phone]

## 2019-02-28 NOTE — Progress Notes (Signed)
BP starting to increase. Restart atenolol 25 mg daily, increase to 50 mg daily in a week if BP still high  [facility] will sign off.   Medication Recommendations:  Amlodipine 10 mg daily, atenolol 25 mg daily Other recommendations (labs, testing, etc):  Recheck BMET Follow up as an outpatient:  08/24, Kerin Ransom PA (virtual visit, please make sure has a way to check BP at home).  Sanda Klein, MD, [facility] [phone] office [phone] pager

## 2019-02-28 NOTE — Progress Notes (Signed)
PHARMACY - PHYSICIAN COMMUNICATION CRITICAL VALUE ALERT - BLOOD CULTURE IDENTIFICATION (BCID)  Catherine Ramsey is an 73 y.o. female who presented to [facility] on 02/25/2019 with a chief complaint of syncope  Assessment:  Patient has one blood culture bottle positive for coagulase negative staph, mec a gene detected.  Possible contamination  Name of physician (or Provider) Contacted: Dr. Verlon Au  Current antibiotics: none  Changes to prescribed antibiotics recommended: Monitor off antibiotics   Results for orders placed or performed during the hospital encounter of 02/25/19  Blood Culture ID Panel (Reflexed) (Collected: 02/27/2019  3:50 PM)  Result Value Ref Range   Enterococcus species NOT DETECTED NOT DETECTED   Listeria monocytogenes NOT DETECTED NOT DETECTED   Staphylococcus species DETECTED (A) NOT DETECTED   Staphylococcus aureus (BCID) NOT DETECTED NOT DETECTED   Methicillin resistance DETECTED (A) NOT DETECTED   Streptococcus species NOT DETECTED NOT DETECTED   Streptococcus agalactiae NOT DETECTED NOT DETECTED   Streptococcus pneumoniae NOT DETECTED NOT DETECTED   Streptococcus pyogenes NOT DETECTED NOT DETECTED   Acinetobacter baumannii NOT DETECTED NOT DETECTED   Enterobacteriaceae species NOT DETECTED NOT DETECTED   Enterobacter cloacae complex NOT DETECTED NOT DETECTED   Escherichia coli NOT DETECTED NOT DETECTED   Klebsiella oxytoca NOT DETECTED NOT DETECTED   Klebsiella pneumoniae NOT DETECTED NOT DETECTED   Proteus species NOT DETECTED NOT DETECTED   Serratia marcescens NOT DETECTED NOT DETECTED   Haemophilus influenzae NOT DETECTED NOT DETECTED   Neisseria meningitidis NOT DETECTED NOT DETECTED   Pseudomonas aeruginosa NOT DETECTED NOT DETECTED   Candida albicans NOT DETECTED NOT DETECTED   Candida glabrata NOT DETECTED NOT DETECTED   Candida krusei NOT DETECTED NOT DETECTED   Candida parapsilosis NOT DETECTED NOT DETECTED   Candida tropicalis NOT DETECTED NOT  DETECTED    [name] 02/28/2019  10:35 AM

## 2019-03-01 LAB — GLUCOSE, CAPILLARY: Glucose-Capillary: 101 mg/dL — ABNORMAL HIGH (ref 70–99)

## 2019-03-01 MED ORDER — ACETAMINOPHEN 325 MG PO TABS: 650.0000 mg | ORAL_TABLET | Freq: Four times a day (QID) | ORAL | Status: DC | PRN

## 2019-03-01 MED ORDER — DEXAMETHASONE 2 MG PO TABS: ORAL_TABLET | ORAL | 0 refills | Status: AC

## 2019-03-01 MED ORDER — ATENOLOL 25 MG PO TABS: 25.0000 mg | ORAL_TABLET | Freq: Every day | ORAL | 0 refills | Status: DC

## 2019-03-01 NOTE — Discharge Summary (Signed)
Physician Discharge Summary  Catherine Ramsey EXH:[id number] DOB: Dec 12, 1945 DOA: 02/25/2019  PCP: Leanna Battles, MD  Admit date: 02/25/2019 Discharge date: 03/01/2019  Time spent: 35 minutes  Recommendations for Outpatient Follow-up:  1. Note medication adjustment and discontinuation of various ones including cutting back atenolol dosing 2. Needs Chem-12 in about 1 to 2 weeks 3. Suggest virtual visit with primary care physician in the outpatient setting 4. Recommend increase in atenolol as per PCP with ambulatory monitoring 5. Have given a taper of Decadron 6 mg over the next 10 days 6. Coronavirus 19 specific instructions regarding isolation, safety, follow-up have been given to the patient on discharge including self monitoring  Discharge Diagnoses:  Principal Problem:   AKI (acute kidney injury) ([facility]) Active Problems:   DM2 (diabetes mellitus, type 2) ([place])   HTN (hypertension)   Syncope   COVID-19 virus detected   Acute kidney injury ([place])   Diabetes mellitus due to underlying condition, uncontrolled, with hyperglycemia ([place])   ATN (acute tubular necrosis) ([place])   Sinus bradycardia   Discharge Condition: improved  Diet recommendation: salt restricted  Filed Weights   02/26/19 0105 02/27/19 0500 03/01/19 0448  Weight: 90.6 kg 92.3 kg 93.4 kg    History of present illness:  73 aaf diabetes mellitus and hypertension presented with 2 episodes of syncope.   One episode was 2 days prior to admission and the next on the day of admission.  2 days prior she was sitting on the chair where her husband noticed her to be somnolent and frothing from her mouth and woke up after several minutes  On the day of admission she was sitting on the chair next to her husband again when all of a sudden she passed out.  She does not remember how long she was gone for (thinks this was several minutes) Denies any prodromal symptoms including lightheadedness, dizziness, blurred vision, headache,  chest pain, palpitations.  No witnessed seizures, bowel or urinary incontinence.  Denies similar symptoms in the past.     Her niece was recently tested positive for COVID-19 and her mother is hospitalized with COVID-19 infection as well.  hypokalemic with potassium of 3.2,acute kidney injury with creatinine of 2.45 and high-sensitivity troponin of 16.   EKG showed sinus bradycardia at 52 with no ST-T changes.    Patient was tested positive for COVID-19.  Hospital Course:  Syncope [facility]) Initially bradycardic  VQ scan negative for PE, heparin drip discontinued.  Head CT negative. EEG  negative for epileptiform discharge with finding of cortical dysfunction in the left temporal lobe (per neurology is not an uncommon finding in elderly patient and no imaging work-up is needed). Cardiology has seen the patient made recommendations with regards to meds-amlodipine 10, atenolol 25 which can be increased in 1 week to 50 if blood pressure is high-patient is to follow-up with PCP with regards to the same    AKI (acute kidney injury) ([place]) Suspect ATN with dehydration and NSAID use.  Patient reports taking multiple frequent ibuprofen for pain-I have placed her only on Naprosyn on discharge Continue IV fluids next 24 hours and if stabilizes can DC  Renal ultrasound unremarkable.  COVID-19 infection. Patient has no fever or respiratory symptoms.  Fever 101.4---7/28-follow temperature curve overnight Repeat all biomarkers LDH 246, ferritin 350 CRP 0.9 on 7/29  Decadron 6 mg daily with taper on discharge  Sinus bradycardia As outlined above.  Cardiology consulted and have made recommendations    DM2 (diabetes mellitus, type 2),  uncontrolled with hyperglycemia ([facility]) A1c of 9.  CBG ranging 1 15-1 97 Continue Decadron 6 mg and taper so that no hypoglycemia  Hyperlipidemia Continue statin  Essential hypertension As per above  Thrombocytopenia Mild.  No signs of bleeding.  Monitor for  now.  Coag negative staph in 1 bottle Likely a contaminant-no further treatment at this time   Procedures:  Ultrasounds, echo, VQ scan (  Consultations:  Cardiology  Discharge Exam: Vitals:   02/28/19 2213 03/01/19 0747  BP: (!) 157/100 (!) 144/68  Pulse:  (!) 52  Resp: 18 18  Temp: 98.8 F (37.1 C) 98.2 F (36.8 C)  SpO2: 99% 99%   Awake coherent pleasant no distress Eating drinking having oatmeal No chills no Reiger No chest pain No difficulty breathing   General: Well alert awake sitting at bedside not on oxygen Cardiovascular: S1-S2 no murmur rub or gallop Respiratory: Clinically clear no added sound no rales no rhonchi no egophony Abdomen soft No lower extremity edema  Discharge Instructions   Discharge Instructions    Diet - low sodium heart healthy   Complete by: As directed    Discharge instructions   Complete by: As directed    We have providedinstructions in detail about infection prevention, transmission and self quarantine for next 2 weeks. Also instructed on returning to the hospital if she has worsening shortness of breath, cough, chest pain, fevers, chills, nausea, vomiting, abdominal pain .  Do not expose other, always wear a mask at least for the above time period  Talk to your regular MD about increasing back your atenolol in a couple of weeks Get labs checked in 1-2 weeks   Increase activity slowly   Complete by: As directed    [place] COVID-19 home monitoring program   Complete by: Mar 01, 2019    Is the patient willing to use the [facility] for home monitoring?: Yes     Allergies as of 03/01/2019   No Known Allergies     Medication List    STOP taking these medications   losartan-hydrochlorothiazide 100-25 MG tablet Commonly known as: HYZAAR     TAKE these medications   acetaminophen 325 MG tablet Commonly known as: TYLENOL Take 2 tablets (650 mg total) by mouth every 6 (six) hours as needed for mild pain (or  Fever >/= 101).   amLODipine 10 MG tablet Commonly known as: NORVASC Take 10 mg by mouth daily.   atenolol 25 MG tablet Commonly known as: TENORMIN Take 1 tablet (25 mg total) by mouth daily. Start taking on: March 02, 2019 What changed:   medication strength  how much to take  when to take this   dexamethasone 2 MG tablet Commonly known as: DECADRON Take 3 tablets (6 mg total) by mouth daily for 3 days, THEN 2 tablets (4 mg total) daily for 3 days, THEN 1 tablet (2 mg total) daily for 3 days. Start taking on: March 02, 2019   gabapentin 300 MG capsule Commonly known as: NEURONTIN Take 300 mg by mouth daily as needed (pain).   insulin glargine 100 UNIT/ML injection Commonly known as: LANTUS Inject 20 Units into the skin daily.   Klor-Con M20 20 MEQ tablet Generic drug: potassium chloride SA Take 20 mEq by mouth daily.   NovoLOG FlexPen 100 UNIT/ML FlexPen Generic drug: insulin aspart Inject 1-10 Units into the skin 3 (three) times daily with meals. Sliding scale   pravastatin 40 MG tablet Commonly known as: PRAVACHOL Take 40  mg by mouth at bedtime.      No Known Allergies Follow-up Information    Erlene Quan, PA-C Follow up.   Specialties: Cardiology, Radiology Why: Cardiology hospital follow-up on 03/26/2019 at 8:00 AM.  This will be a virtual visit, using her smart phone.  You will not going to the office.  You will be called prior to your appointment with details on conducting this visit. Contact information: [place] [address] [phone]            The results of significant diagnostics from this hospitalization (including imaging, microbiology, ancillary and laboratory) are listed below for reference.    Significant Diagnostic Studies: Nm Pulmonary Perfusion  Result Date: 02/26/2019 CLINICAL DATA:  Initial evaluation for acute shortness of breath, history of COVID-19. EXAM: NUCLEAR MEDICINE PERFUSION LUNG SCAN  TECHNIQUE: Perfusion images were obtained in multiple projections after intravenous injection of radiopharmaceutical. Ventilation scans intentionally deferred if perfusion scan and chest x-ray adequate for interpretation during COVID 19 epidemic. RADIOPHARMACEUTICALS:  1.56 mCi Tc-34m MAA IV COMPARISON:  Comparison made with prior radiograph 02/25/2019. FINDINGS: Perfusion only imaging demonstrates homogeneous and symmetric radiotracer signal throughout both lungs bilaterally. No peripheral wedge-shaped perfusion deficits to suggest acute pulmonary embolism. No other perfusion abnormality or other finding identified. IMPRESSION: Negative perfusion lung scan, with no evidence for acute pulmonary embolism. Electronically Signed   By: Jeannine Boga M.D.   On: 02/26/2019 19:22   US Renal  Result Date: 02/26/2019 CLINICAL DATA:  Acute kidney injury, COVID positive EXAM: RENAL / URINARY TRACT ULTRASOUND COMPLETE COMPARISON:  None. FINDINGS: Right Kidney: Renal measurements: 8.8 x 4.7 x 5.1 cm = volume: 107 mL . Echogenicity within normal limits. No mass or hydronephrosis visualized. Left Kidney: Renal measurements: 10.4 x 5.3 x 5.6 cm = volume: 160 mL. Echogenicity within normal limits. No mass or hydronephrosis visualized. Bladder: Not discretely visualized/underdistended. IMPRESSION: Bilateral kidneys are within normal limits.  No hydronephrosis. Bladder is not discretely visualized. Electronically Signed   By: Julian Hy M.D.   On: 02/26/2019 02:58   Dg Chest Port 1 View  Result Date: 02/25/2019 CLINICAL DATA:  Syncope EXAM: PORTABLE CHEST 1 VIEW COMPARISON:  07/08/2012 FINDINGS: Minimal elevation of left diaphragm. Mild atelectasis at left base. No pleural effusion. Borderline heart size. No pneumothorax. IMPRESSION: Mild bibasilar atelectasis. Electronically Signed   By: Donavan Foil M.D.   On: 02/25/2019 21:45   Xr Hip Unilat W Or W/o Pelvis 1v Left  Result Date: 02/07/2019 An AP pelvis and  lateral of the right hip shows a total hip arthroplasty on the right side with no complicating features.  The AP view does show moderate arthritic changes in the left hip with joint space narrowing  Xr Lumbar Spine 2-3 Views  Result Date: 02/07/2019 An AP and lateral of the lumbar spine shows severe degenerative scoliosis with significant arthritic changes at multiple levels as well as disc space narrowing.   Microbiology: Recent Results (from the past 240 hour(s))  SARS Coronavirus 2 (CEPHEID - Performed in [facility] hospital lab), Hosp Order     Status: Abnormal   Collection Time: 02/25/19  9:32 PM   Specimen: Nasopharyngeal Swab  Result Value Ref Range Status   SARS Coronavirus 2 POSITIVE (A) NEGATIVE Final    Comment: RESULT CALLED TO, READ BACK BY AND VERIFIED WITH: RN K FILDS @2324  02/25/19 BY S GEZAHEGN (NOTE) If result is NEGATIVE SARS-CoV-2 target nucleic acids are NOT DETECTED. The SARS-CoV-2 RNA  is generally detectable in upper and lower  respiratory specimens during the acute phase of infection. The lowest  concentration of SARS-CoV-2 viral copies this assay can detect is 250  copies / mL. A negative result does not preclude SARS-CoV-2 infection  and should not be used as the sole basis for treatment or other  patient management decisions.  A negative result may occur with  improper specimen collection / handling, submission of specimen other  than nasopharyngeal swab, presence of viral mutation(s) within the  areas targeted by this assay, and inadequate number of viral copies  (<250 copies / mL). A negative result must be combined with clinical  observations, patient history, and epidemiological information. If result is POSITIVE SARS-CoV-2 target nucleic acids are DETECTED.  The SARS-CoV-2 RNA is generally detectable in upper and lower  respiratory specimens during the acute phase of infection.  Positive  results are indicative of active infection with SARS-CoV-2.   Clinical  correlation with patient history and other diagnostic information is  necessary to determine patient infection status.  Positive results do  not rule out bacterial infection or co-infection with other viruses. If result is PRESUMPTIVE POSTIVE SARS-CoV-2 nucleic acids MAY BE PRESENT.   A presumptive positive result was obtained on the submitted specimen  and confirmed on repeat testing.  While 2019 novel coronavirus  (SARS-CoV-2) nucleic acids may be present in the submitted sample  additional confirmatory testing may be necessary for epidemiological  and / or clinical management purposes  to differentiate between  SARS-CoV-2 and other Sarbecovirus currently known to infect humans.  If clinically indicated additional testing with an alternate test  methodology [id number])  is advised. The SARS-CoV-2 RNA is generally  detectable in upper and lower respiratory specimens during the acute  phase of infection. The expected result is Negative. Fact Sheet for Patients:  StrictlyIdeas.no Fact Sheet for Healthcare Providers: BankingDealers.co.za This test is not yet approved or cleared by the [place] FDA and has been authorized for detection and/or diagnosis of SARS-CoV-2 by FDA under an Emergency Use Authorization (EUA).  This EUA will remain in effect (meaning this test can be used) for the duration of the COVID-19 declaration under Section 564(b)(1) of the Act, 21 U.S.C. section 360bbb-3(b)(1), unless the authorization is terminated or revoked sooner. Performed at [facility], [place] [address] [place], [place] [address]   Culture, blood (routine x 2)     Status: None (Preliminary result)   Collection Time: 02/27/19  3:38 PM   Specimen: BLOOD  Result Value Ref Range Status   Specimen Description BLOOD LEFT ANTECUBITAL  Final   Special Requests   Final    BOTTLES DRAWN AEROBIC ONLY Blood Culture adequate volume   Culture   Setup Time   Final    GRAM POSITIVE COCCI AEROBIC BOTTLE ONLY CRITICAL VALUE NOTED.  VALUE IS CONSISTENT WITH PREVIOUSLY REPORTED AND CALLED VALUE. Performed at [facility], [place] [address], [place], [place] [address]    Culture GRAM POSITIVE COCCI  Final   Report Status PENDING  Incomplete  Culture, blood (routine x 2)     Status: None (Preliminary result)   Collection Time: 02/27/19  3:50 PM   Specimen: BLOOD  Result Value Ref Range Status   Specimen Description BLOOD LEFT ANTECUBITAL  Final   Special Requests   Final    BOTTLES DRAWN AEROBIC ONLY Blood Culture adequate volume   Culture  Setup Time   Final    GRAM POSITIVE COCCI  IN CLUSTERS AEROBIC BOTTLE ONLY CRITICAL RESULT CALLED TO, READ BACK BY AND VERIFIED WITH: Andres Shad PharmD 10:25 02/28/19 ([email or web address]) Performed at [facility], [place] [address], [place], [place] [address]    Culture GRAM POSITIVE COCCI  Final   Report Status PENDING  Incomplete  Blood Culture ID Panel (Reflexed)     Status: Abnormal   Collection Time: 02/27/19  3:50 PM  Result Value Ref Range Status   Enterococcus species NOT DETECTED NOT DETECTED Final   Listeria monocytogenes NOT DETECTED NOT DETECTED Final   Staphylococcus species DETECTED (A) NOT DETECTED Final    Comment: Methicillin (oxacillin) resistant coagulase negative staphylococcus. Possible blood culture contaminant (unless isolated from more than one blood culture draw or clinical case suggests pathogenicity). No antibiotic treatment is indicated for blood  culture contaminants. CRITICAL RESULT CALLED TO, READ BACK BY AND VERIFIED WITH: Andres Shad PharmD 10:25 02/28/19 ([email or web address])    Staphylococcus aureus (BCID) NOT DETECTED NOT DETECTED Final   Methicillin resistance DETECTED (A) NOT DETECTED Final    Comment: CRITICAL RESULT CALLED TO, READ BACK BY AND VERIFIED WITH: Andres Shad PharmD 10:25 02/28/19 ([email or web address])    Streptococcus species NOT DETECTED NOT DETECTED Final   Streptococcus  agalactiae NOT DETECTED NOT DETECTED Final   Streptococcus pneumoniae NOT DETECTED NOT DETECTED Final   Streptococcus pyogenes NOT DETECTED NOT DETECTED Final   Acinetobacter baumannii NOT DETECTED NOT DETECTED Final   Enterobacteriaceae species NOT DETECTED NOT DETECTED Final   Enterobacter cloacae complex NOT DETECTED NOT DETECTED Final   Escherichia coli NOT DETECTED NOT DETECTED Final   Klebsiella oxytoca NOT DETECTED NOT DETECTED Final   Klebsiella pneumoniae NOT DETECTED NOT DETECTED Final   Proteus species NOT DETECTED NOT DETECTED Final   Serratia marcescens NOT DETECTED NOT DETECTED Final   Haemophilus influenzae NOT DETECTED NOT DETECTED Final   Neisseria meningitidis NOT DETECTED NOT DETECTED Final   Pseudomonas aeruginosa NOT DETECTED NOT DETECTED Final   Candida albicans NOT DETECTED NOT DETECTED Final   Candida glabrata NOT DETECTED NOT DETECTED Final   Candida krusei NOT DETECTED NOT DETECTED Final   Candida parapsilosis NOT DETECTED NOT DETECTED Final   Candida tropicalis NOT DETECTED NOT DETECTED Final    Comment: Performed at [facility], [place]. [address], [place], [place] [address]     Labs: Basic Metabolic Panel: Recent Labs  Lab 02/25/19 2121 02/26/19 0231 02/27/19 0612 02/28/19 0954  NA 140 139 144 142  K 3.2* 3.7 3.5 4.1  CL 105 106 109 108  CO2 25 20* 25 23  GLUCOSE 142* 213* 91 175*  BUN 18 21 18 19   CREATININE 2.45* 2.26* 1.75* 1.37*  CALCIUM 8.1* 8.2* 7.8* 8.1*  MG  --  1.4* 1.2* 1.0*   Liver Function Tests: Recent Labs  Lab 02/27/19 0612 02/28/19 0954  AST 20 23  ALT 15 18  ALKPHOS 62 72  BILITOT 0.7 0.9  PROT 6.4* 7.5  ALBUMIN 2.8* 3.1*   No results for input(s): LIPASE, AMYLASE in the last 168 hours. No results for input(s): AMMONIA in the last 168 hours. CBC: Recent Labs  Lab 02/25/19 2121 02/27/19 0612 02/28/19 0954  WBC 4.5 4.9 6.4  NEUTROABS 2.5  --   --   HGB 10.9* 11.0* 12.5  HCT 33.6* 35.2* 39.4  MCV 86.2 87.6  86.6  PLT 99* 109* 180   Cardiac Enzymes: No results for input(s): CKTOTAL, CKMB, CKMBINDEX, TROPONINI in the last 168 hours. BNP: BNP (last 3  results) No results for input(s): BNP in the last 8760 hours.  ProBNP (last 3 results) No results for input(s): PROBNP in the last 8760 hours.  CBG: Recent Labs  Lab 02/28/19 0801 02/28/19 1222 02/28/19 1710 02/28/19 2042 03/01/19 0743  GLUCAP 115* 229* 312* 306* 101*       Signed:  Nita Sells MD   [facility] 03/01/2019, 9:18 AM

## 2019-03-02 LAB — CULTURE, BLOOD (ROUTINE X 2): Special Requests: ADEQUATE

## 2019-03-03 LAB — CULTURE, BLOOD (ROUTINE X 2): Special Requests: ADEQUATE

## 2019-03-14 ENCOUNTER — Other Ambulatory Visit: Payer: Medicare HMO

## 2019-03-19 ENCOUNTER — Ambulatory Visit: Payer: Medicare HMO | Admitting: Orthopaedic Surgery

## 2019-03-26 ENCOUNTER — Telehealth: Payer: Self-pay

## 2019-03-26 ENCOUNTER — Encounter: Payer: Self-pay | Admitting: Cardiology

## 2019-03-26 ENCOUNTER — Other Ambulatory Visit: Payer: Self-pay

## 2019-03-26 ENCOUNTER — Telehealth (INDEPENDENT_AMBULATORY_CARE_PROVIDER_SITE_OTHER): Payer: Medicare HMO | Admitting: Cardiology

## 2019-03-26 VITALS — BP 168/87 | HR 76 | Temp 97.1°F | Ht 67.5 in | Wt 200.0 lb

## 2019-03-26 DIAGNOSIS — N179 Acute kidney failure, unspecified: Secondary | ICD-10-CM

## 2019-03-26 DIAGNOSIS — E119 Type 2 diabetes mellitus without complications: Secondary | ICD-10-CM | POA: Diagnosis not present

## 2019-03-26 DIAGNOSIS — R001 Bradycardia, unspecified: Secondary | ICD-10-CM

## 2019-03-26 DIAGNOSIS — R55 Syncope and collapse: Secondary | ICD-10-CM | POA: Diagnosis not present

## 2019-03-26 DIAGNOSIS — I10 Essential (primary) hypertension: Secondary | ICD-10-CM

## 2019-03-26 DIAGNOSIS — IMO0001 Reserved for inherently not codable concepts without codable children: Secondary | ICD-10-CM

## 2019-03-26 DIAGNOSIS — U071 COVID-19: Secondary | ICD-10-CM | POA: Diagnosis not present

## 2019-03-26 DIAGNOSIS — Z794 Long term (current) use of insulin: Secondary | ICD-10-CM | POA: Diagnosis not present

## 2019-03-26 NOTE — Addendum Note (Signed)
Addended by: Ulice Brilliant T on: 03/26/2019 09:17 AM   Modules accepted: Orders

## 2019-03-26 NOTE — Telephone Encounter (Signed)
Virtual Visit Pre-Appointment Phone Call  "[name], I am calling you today to discuss your upcoming appointment. We are currently trying to limit exposure to the virus that causes COVID-19 by seeing patients at home rather than in the office."  1. "What is the BEST phone number to call the day of the visit?" - include this in appointment notes  2. "Do you have or have access to (through a family member/friend) a smartphone with video capability that we can use for your visit?" a. If yes - list this number in appt notes as "cell" (if different from BEST phone #) and list the appointment type as a VIDEO visit in appointment notes b. If no - list the appointment type as a PHONE visit in appointment notes  3. Confirm consent - "In the setting of the current Covid19 crisis, you are scheduled for a (phone or video) visit with your provider on 03/26/2019 at 8:00am.  Just as we do with many in-office visits, in order for you to participate in this visit, we must obtain consent.  If you'd like, I can send this to your mychart (if signed up) or email for you to review.  Otherwise, I can obtain your verbal consent now.  All virtual visits are billed to your insurance company just like a normal visit would be.  By agreeing to a virtual visit, we'd like you to understand that the technology does not allow for your provider to perform an examination, and thus may limit your provider's ability to fully assess your condition. If your provider identifies any concerns that need to be evaluated in person, we will make arrangements to do so.  Finally, though the technology is pretty good, we cannot assure that it will always work on either your or our end, and in the setting of a video visit, we may have to convert it to a phone-only visit.  In either situation, we cannot ensure that we have a secure connection.  Are you willing to proceed?" STAFF: Did the patient verbally acknowledge consent to telehealth  visit? Document YES/NO here: YES  4. Advise patient to be prepared - "Two hours prior to your appointment, go ahead and check your blood pressure, pulse, oxygen saturation, and your weight (if you have the equipment to check those) and write them all down. When your visit starts, your provider will ask you for this information. If you have an Apple Watch or Kardia device, please plan to have heart rate information ready on the day of your appointment. Please have a pen and paper handy nearby the day of the visit as well."  5. Give patient instructions for MyChart download to smartphone OR Doximity/Doxy.me as below if video visit (depending on what platform provider is using)  6. Inform patient they will receive a phone call 15 minutes prior to their appointment time (may be from unknown caller ID) so they should be prepared to answer    TELEPHONE CALL NOTE  Catherine Ramsey has been deemed a candidate for a follow-up tele-health visit to limit community exposure during the Covid-19 pandemic. I spoke with the patient via phone to ensure availability of phone/video source, confirm preferred email & phone number, and discuss instructions and expectations.  I reminded Catherine Ramsey to be prepared with any vital sign and/or heart rhythm information that could potentially be obtained via home monitoring, at the time of her visit. I reminded Catherine Ramsey to expect a phone call  prior to her visit.  Harold Hedge, CMA 03/26/2019 7:51 AM   INSTRUCTIONS FOR DOWNLOADING THE MYCHART APP TO SMARTPHONE  - The patient must first make sure to have activated MyChart and know their login information - If Apple, go to [employer] and type in MyChart in the search bar and download the app. If Android, ask patient to go to [employer] and type in [place] in the search bar and download the app. The app is free but as with any other app downloads, their phone may require them to verify saved payment  information or Apple/Android password.  - The patient will need to then log into the app with their MyChart username and password, and select [facility] as their healthcare provider to link the account. When it is time for your visit, go to the MyChart app, find appointments, and click Begin Video Visit. Be sure to Select Allow for your device to access the Microphone and Camera for your visit. You will then be connected, and your provider will be with you shortly.  **If they have any issues connecting, or need assistance please contact MyChart service desk [phone])**  **If using a computer, in order to ensure the best quality for their visit they will need to use either of the following Internet Browsers: [employer], or Google Chrome**  IF USING DOXIMITY or DOXY.ME - The patient will receive a link just prior to their visit by text.     FULL LENGTH CONSENT FOR TELE-HEALTH VISIT   I hereby voluntarily request, consent and authorize [facility] and its employed or contracted physicians, physician assistants, nurse practitioners or other licensed health care professionals (the Practitioner), to provide me with telemedicine health care services (the "Services") as deemed necessary by the treating Practitioner. I acknowledge and consent to receive the Services by the Practitioner via telemedicine. I understand that the telemedicine visit will involve communicating with the Practitioner through live audiovisual communication technology and the disclosure of certain medical information by electronic transmission. I acknowledge that I have been given the opportunity to request an in-person assessment or other available alternative prior to the telemedicine visit and am voluntarily participating in the telemedicine visit.  I understand that I have the right to withhold or withdraw my consent to the use of telemedicine in the course of my care at any time, without affecting my right  to future care or treatment, and that the Practitioner or I may terminate the telemedicine visit at any time. I understand that I have the right to inspect all information obtained and/or recorded in the course of the telemedicine visit and may receive copies of available information for a reasonable fee.  I understand that some of the potential risks of receiving the Services via telemedicine include:  Marland Kitchen Delay or interruption in medical evaluation due to technological equipment failure or disruption; . Information transmitted may not be sufficient (e.g. poor resolution of images) to allow for appropriate medical decision making by the Practitioner; and/or  . In rare instances, security protocols could fail, causing a breach of personal health information.  Furthermore, I acknowledge that it is my responsibility to provide information about my medical history, conditions and care that is complete and accurate to the best of my ability. I acknowledge that Practitioner's advice, recommendations, and/or decision may be based on factors not within their control, such as incomplete or inaccurate data provided by me or distortions of diagnostic images or specimens that may result from  electronic transmissions. I understand that the practice of medicine is not an exact science and that Practitioner makes no warranties or guarantees regarding treatment outcomes. I acknowledge that I will receive a copy of this consent concurrently upon execution via email to the email address I last provided but may also request a printed copy by calling the office of [facility].    I understand that my insurance will be billed for this visit.   I have read or had this consent read to me. . I understand the contents of this consent, which adequately explains the benefits and risks of the Services being provided via telemedicine.  . I have been provided ample opportunity to ask questions regarding this consent and the Services  and have had my questions answered to my satisfaction. . I give my informed consent for the services to be provided through the use of telemedicine in my medical care  By participating in this telemedicine visit I agree to the above.

## 2019-03-26 NOTE — Patient Instructions (Signed)
Medication Instructions:  Your physician recommends that you continue on your current medications as directed. Please refer to the Current Medication list given to you today. If you need a refill on your cardiac medications before your next appointment, please call your pharmacy.   Lab work: Your physician recommends that you return for lab work in: TODAY-BMET If you have labs (blood work) drawn today and your tests are completely normal, you will receive your results only by: Marland Kitchen MyChart Message (if you have MyChart) OR . A paper copy in the mail If you have any lab test that is abnormal or we need to change your treatment, we will call you to review the results.  Testing/Procedures: Your physician recommends that you continue on your current medications as directed. Please refer to the Current Medication list given to you today.  Follow-Up: At [facility], you and your health needs are our priority.  As part of our continuing mission to provide you with exceptional heart care, we have created designated Provider Care Teams.  These Care Teams include your primary Cardiologist (physician) and Advanced Practice Providers (APPs -  Physician Assistants and Nurse Practitioners) who all work together to provide you with the care you need, when you need it. You will need a follow up appointment in 2 months.  Please call our office 2 months in advance to schedule this appointment.  You may see Sanda Klein, MD or one of the following Advanced Practice Providers on your designated Care Team:   Rosaria Ferries, PA-C . Jory Sims, DNP, ANP  Any Other Special Instructions Will Be Listed Below (If Applicable).

## 2019-03-26 NOTE — Telephone Encounter (Addendum)
Contacted patient again to remind her to contact her pcp for a follow up.(per Lurena Joiner) She voiced understanding.

## 2019-03-26 NOTE — Telephone Encounter (Signed)
Contacted patient to give AVS instructions from today's virtual visit. Patient voiced understanding. No questions or concerns at this time. AVS will be mailed to patient.  

## 2019-03-26 NOTE — Progress Notes (Signed)
Virtual Visit via Video Note   This visit type was conducted due to national recommendations for restrictions regarding the COVID-19 Pandemic (e.g. social distancing) in an effort to limit this patient's exposure and mitigate transmission in our community.  Due to her co-morbid illnesses, this patient is at least at moderate risk for complications without adequate follow up.  This format is felt to be most appropriate for this patient at this time.  All issues noted in this document were discussed and addressed.  A limited physical exam was performed with this format.  Please refer to the patient's chart for her consent to telehealth for [facility].   Date:  03/26/2019   ID:  Catherine Ramsey, DOB 15-Apr-1946, MRN [id number]  Patient Location: Home Provider Location: Home  PCP:  Leanna Battles, MD  Cardiologist:  Sanda Klein, MD  Electrophysiologist:  None   Evaluation Performed:  Follow-Up Visit  Chief Complaint:  None  History of Present Illness:    Catherine Ramsey is a 73 y.o. female with history of hypertension and insulin-dependent diabetes.  She was admitted to the hospital with syncope July 26.  She had been caring for her mother at home who was ill, it turns out her mother had CO VID.  The patient was bradycardic and dehydrated on admission.  Her creatinine level was 2.45.  Her high-sensitivity troponin was 16.  Her heart rate was in the 50s, periodically would drop into the 40s.  She had a temp of 101.4.  She did test positive for CO VID.  The patient was admitted and hydrated.  Her medications were adjusted, she had previously been on atenolol 50 mg twice daily and losartan HCTZ.  At discharge her serum creatinine was 1.3.  She was contacted today for follow up.  There was technical issues and video conferencing could not be possible and I spoke to her on the phone.  She reports she has been doing well since discharge.  She did tell me that her mother passed last week.  The  patient has been following her blood pressure at home, her systolic is running in the 130s.  She monitors her heart rate and does not take her atenolol 25 mg if her heart rate is in the 50s.  She denies any shortness of breath.  She has been eating and drinking well.  The patient does not have symptoms concerning for COVID-19 infection (fever, chills, cough, or new shortness of breath).    Past Medical History:  Diagnosis Date  . Achalasia - Type II 05/08/2010   Qualifier: Diagnosis of  By: Carlean Purl MD, Tonna Boehringer E   . Candida esophagitis ([place]) 03/26/2014  . Diabetes mellitus    type 2  . Family history of adverse reaction to anesthesia    son hard to wake up  . Hypertension    Past Surgical History:  Procedure Laterality Date  . ABDOMINAL HYSTERECTOMY    . BREAST BIOPSY     left/benign  . ESOPHAGEAL MANOMETRY N/A 04/01/2014   Procedure: ESOPHAGEAL MANOMETRY (EM);  Surgeon: Gatha Mayer, MD;  Location: [facility];  Service: Endoscopy;  Laterality: N/A;  . HELLER MYOTOMY N/A 12/07/2016   Procedure: LAPAROSCOPIC HELLER MYOTOMY, DOR FUNDOPLICATION, HIATAL HERNIA REPAIR, INTRAOPERATIVE UPPER ENDOSCOPY;  Surgeon: Jackolyn Confer, MD;  Location: [facility];  Service: General;  Laterality: N/A;  . TOTAL HIP ARTHROPLASTY     right pin then replacement     Current Meds  Medication Sig  .  acetaminophen (TYLENOL) 325 MG tablet Take 2 tablets (650 mg total) by mouth every 6 (six) hours as needed for mild pain (or Fever >/= 101).  Marland Kitchen amLODipine (NORVASC) 10 MG tablet Take 10 mg by mouth daily.  Marland Kitchen atenolol (TENORMIN) 25 MG tablet Take 1 tablet (25 mg total) by mouth daily.  Marland Kitchen gabapentin (NEURONTIN) 300 MG capsule Take 300 mg by mouth daily as needed (pain).  . insulin aspart (NOVOLOG FLEXPEN) 100 UNIT/ML FlexPen Inject 1-10 Units into the skin 3 (three) times daily with meals. Sliding scale  . insulin glargine (LANTUS) 100 UNIT/ML injection Inject 20 Units into the skin daily.  Marland Kitchen KLOR-CON M20 20  MEQ tablet Take 20 mEq by mouth daily.  . pravastatin (PRAVACHOL) 40 MG tablet Take 40 mg by mouth at bedtime.     Allergies:   Patient has no known allergies.   Social History   Tobacco Use  . Smoking status: Never Smoker  . Smokeless tobacco: Never Used  Substance Use Topics  . Alcohol use: No  . Drug use: No     Family Hx: The patient's family history includes Diabetes in her father and mother; Hyperlipidemia in her mother; Hypertension in her father, mother, sister, and sister.  ROS:   Please see the history of present illness.    All other systems reviewed and are negative.   Prior CV studies:   The following studies were reviewed today:  Echo July 2020  Labs/Other Tests and Data Reviewed:    EKG:  No ECG reviewed.  Recent Labs: 02/26/2019: TSH 2.590 02/28/2019: ALT 18; BUN 19; Creatinine, Ser 1.37; Hemoglobin 12.5; Magnesium 1.0; Platelets 180; Potassium 4.1; Sodium 142   Recent Lipid Panel No results found for: CHOL, TRIG, HDL, CHOLHDL, LDLCALC, LDLDIRECT  Wt Readings from Last 3 Encounters:  03/26/19 200 lb (90.7 kg)  03/01/19 205 lb 14.6 oz (93.4 kg)  02/07/19 205 lb (93 kg)     Objective:    Vital Signs:  BP (!) 168/87   Pulse 76   Temp (!) 97.1 F (36.2 C) (Temporal)   Ht 5' 7.5" (1.715 m)   Wt 200 lb (90.7 kg)   BMI 30.86 kg/m    VITAL SIGNS:  reviewed  ASSESSMENT & PLAN:    1. Syncope-secondary to dehydration and medications 2. AKI-discharge SCr 1.3.  Losartan HCTZ stopped. Check BMP 3. IDDM-per PCP 4. HTN- controlled but may drift up as she gets back to baseline 5. COVID- she has recovered from her COVID illness.   Plan: Check BMP. F/U with PCP and see cardiology in 2 months- PRN follow after that if she is stable.   COVID-19 Education: The signs and symptoms of COVID-19 were discussed with the patient and how to seek care for testing (follow up with PCP or arrange E-visit).  The importance of social distancing was discussed today.   Time:   Today, I have spent 20 minutes with the patient with telehealth technology discussing the above problems.     Medication Adjustments/Labs and Tests Ordered: Current medicines are reviewed at length with the patient today.  Concerns regarding medicines are outlined above.   Tests Ordered: No orders of the defined types were placed in this encounter.   Medication Changes: No orders of the defined types were placed in this encounter.   Follow Up:  In Person Check BMP. Follow up with Dr Sallyanne Kuster or an APP in two months.  Angelena Form, PA-C  03/26/2019 8:25 AM  [facility]

## 2019-03-27 LAB — BASIC METABOLIC PANEL
BUN/Creatinine Ratio: 10 — ABNORMAL LOW (ref 12–28)
BUN: 11 mg/dL (ref 8–27)
CO2: 23 mmol/L (ref 20–29)
Calcium: 9 mg/dL (ref 8.7–10.3)
Chloride: 106 mmol/L (ref 96–106)
Creatinine, Ser: 1.14 mg/dL — ABNORMAL HIGH (ref 0.57–1.00)
GFR calc Af Amer: 56 mL/min/{1.73_m2} — ABNORMAL LOW (ref >59)
GFR calc non Af Amer: 48 mL/min/{1.73_m2} — ABNORMAL LOW (ref >59)
Glucose: 232 mg/dL — ABNORMAL HIGH (ref 65–99)
Potassium: 4.6 mmol/L (ref 3.5–5.2)
Sodium: 143 mmol/L (ref 134–144)

## 2019-03-30 DIAGNOSIS — I129 Hypertensive chronic kidney disease with stage 1 through stage 4 chronic kidney disease, or unspecified chronic kidney disease: Secondary | ICD-10-CM | POA: Diagnosis not present

## 2019-03-30 DIAGNOSIS — N183 Chronic kidney disease, stage 3 (moderate): Secondary | ICD-10-CM | POA: Diagnosis not present

## 2019-03-30 DIAGNOSIS — E1165 Type 2 diabetes mellitus with hyperglycemia: Secondary | ICD-10-CM | POA: Diagnosis not present

## 2019-04-02 ENCOUNTER — Encounter: Payer: Self-pay | Admitting: Internal Medicine

## 2019-04-06 DIAGNOSIS — I129 Hypertensive chronic kidney disease with stage 1 through stage 4 chronic kidney disease, or unspecified chronic kidney disease: Secondary | ICD-10-CM | POA: Diagnosis not present

## 2019-04-06 DIAGNOSIS — N183 Chronic kidney disease, stage 3 (moderate): Secondary | ICD-10-CM | POA: Diagnosis not present

## 2019-04-11 ENCOUNTER — Ambulatory Visit
Admission: RE | Admit: 2019-04-11 | Discharge: 2019-04-11 | Disposition: A | Discharge status: Home / Self Care | Payer: Medicare HMO | Source: Ambulatory Visit | Attending: Orthopaedic Surgery | Admitting: Orthopaedic Surgery

## 2019-04-11 ENCOUNTER — Other Ambulatory Visit: Payer: Self-pay

## 2019-04-11 DIAGNOSIS — M48061 Spinal stenosis, lumbar region without neurogenic claudication: Secondary | ICD-10-CM | POA: Diagnosis not present

## 2019-04-11 DIAGNOSIS — M4807 Spinal stenosis, lumbosacral region: Secondary | ICD-10-CM

## 2019-04-18 ENCOUNTER — Ambulatory Visit (AMBULATORY_SURGERY_CENTER): Payer: Self-pay | Admitting: *Deleted

## 2019-04-18 ENCOUNTER — Encounter: Payer: Self-pay | Admitting: Internal Medicine

## 2019-04-18 ENCOUNTER — Other Ambulatory Visit: Payer: Self-pay

## 2019-04-18 VITALS — Temp 96.9°F | Ht 67.5 in | Wt 205.0 lb

## 2019-04-18 DIAGNOSIS — Z86010 Personal history of colonic polyps: Secondary | ICD-10-CM

## 2019-04-18 NOTE — Progress Notes (Signed)
Patient is here in-person for PV. Patient denies any allergies to eggs or soy. Patient denies any problems with anesthesia/sedation. Patient denies any oxygen use at home. Patient denies taking any diet/weight loss medications or blood thinners. Patient is not being treated for MRSA or C-diff.   Per chart pt had + Covid test on 02/25/2019, pt denies any current symptoms.   Pt is aware that care partner will wait in the car during procedure; if they feel like they will be too hot to wait in the car; they may wait in the lobby.  We want them to wear a mask (we do not have any that we can provide them), practice social distancing, and we will check their temperatures when they get here.  I did remind patient that their care partner needs to stay in the parking lot the entire time. Pt will wear mask into building.

## 2019-04-23 ENCOUNTER — Encounter: Payer: Self-pay | Admitting: Orthopaedic Surgery

## 2019-04-23 ENCOUNTER — Ambulatory Visit (INDEPENDENT_AMBULATORY_CARE_PROVIDER_SITE_OTHER): Payer: Medicare HMO | Admitting: Orthopaedic Surgery

## 2019-04-23 VITALS — Ht 67.5 in | Wt 205.0 lb

## 2019-04-23 DIAGNOSIS — M4807 Spinal stenosis, lumbosacral region: Secondary | ICD-10-CM | POA: Diagnosis not present

## 2019-04-23 NOTE — Progress Notes (Signed)
The patient comes in today to go over an MRI of her lumbar spine.  This was compared to an MRI that she had in 2009.  She reports chronic low back pain with some radicular component going into her left hip and sometimes to her right hip area.  She is a diabetic but reports good control.  She does take Neurontin 300 mg once daily but is been told she can take it twice daily.  When she gets up first thing in the morning she says is not really bad but as she gets a longer day with her mobility her back pain gets worse.  She points across the lower aspect of her lumbar spine and really the mid lumbar spine as the main source of her pain.  With the failure of conservative treatment we did send her for an MRI of her lumbar spine to better ascertain the source of her pain.  On exam she gets up out of a chair easily.  She is very active and mobile for 73.  She is irritated with a straight leg raise bilaterally but has good strength in both of her legs.  Most of her pain seems to be in the lumbar spine with flexion extension at the mid lumbar spine and not going down her leg somewhat today.  The MRI of her lumbar spine is reviewed with her and it does show severe stenosis at L3-L4 as well as L4-L5.  This is both central and foraminal.  It is also multifactorial.  At this point she is interested in at least an intervention by Dr. Ernestina Patches in her lumbar spine but I am not sure whether this should be facet joint type of injections versus ESI's.  Also determining whether at L3-L4 versus L4-L5 is difficult to make him good judgment on which would be the best area to inject.  I will send her to Dr. Ernestina Patches for consultation and intervention.  All question concerns were answered and addressed.  After she sees Dr. Ernestina Patches he can certainly send her back to [place] in follow-up.

## 2019-04-23 NOTE — Addendum Note (Signed)
Addended by: Meyer Cory on: 04/23/2019 04:32 PM   Modules accepted: Orders

## 2019-04-27 ENCOUNTER — Telehealth: Payer: Self-pay | Admitting: Internal Medicine

## 2019-04-27 NOTE — Telephone Encounter (Signed)
Do you now or have you had a fever in the last 14 days?     No °  °Do you have any respiratory symptoms of shortness of breath or cough now or in the last 14 days?    No °  °Do you have any family members or close contacts with diagnosed or suspected Covid-19 in the past 14 days?  No °  °Have you been tested for Covid-19 and found to be positive?    No °  °Pt made aware of that care partner may come to the lobby during the procedure but will need to provide their own mask. ° ° °

## 2019-04-30 ENCOUNTER — Ambulatory Visit (AMBULATORY_SURGERY_CENTER): Payer: Medicare HMO | Admitting: Internal Medicine

## 2019-04-30 ENCOUNTER — Other Ambulatory Visit: Payer: Self-pay

## 2019-04-30 ENCOUNTER — Other Ambulatory Visit: Payer: Self-pay | Admitting: Internal Medicine

## 2019-04-30 ENCOUNTER — Encounter: Payer: Self-pay | Admitting: Internal Medicine

## 2019-04-30 VITALS — BP 130/68 | HR 61 | Temp 98.3°F | Resp 15 | Ht 67.0 in | Wt 205.0 lb

## 2019-04-30 DIAGNOSIS — Z86010 Personal history of colonic polyps: Secondary | ICD-10-CM | POA: Diagnosis not present

## 2019-04-30 DIAGNOSIS — D123 Benign neoplasm of transverse colon: Secondary | ICD-10-CM

## 2019-04-30 DIAGNOSIS — D122 Benign neoplasm of ascending colon: Secondary | ICD-10-CM

## 2019-04-30 DIAGNOSIS — D120 Benign neoplasm of cecum: Secondary | ICD-10-CM | POA: Diagnosis not present

## 2019-04-30 DIAGNOSIS — Z1211 Encounter for screening for malignant neoplasm of colon: Secondary | ICD-10-CM | POA: Diagnosis not present

## 2019-04-30 DIAGNOSIS — D124 Benign neoplasm of descending colon: Secondary | ICD-10-CM | POA: Diagnosis not present

## 2019-04-30 MED ORDER — SODIUM CHLORIDE 0.9 % IV SOLN: 500.0000 mL | Freq: Once | INTRAVENOUS | Status: DC

## 2019-04-30 NOTE — Patient Instructions (Addendum)
I found and removed 7 tiny polyps - no signs of cancer.  I will review the pathology and let you know timing of next colonoscopy - suspect it will be 2023.  I am thinking you should have an upper endoscopy also - this year.  I need to review your chart and let you know.  I appreciate the opportunity to care for you. Gatha Mayer, MD, [facility]   Handout given for polyps.   YOU HAD AN ENDOSCOPIC PROCEDURE TODAY AT [facility]:   Refer to the procedure report that was given to you for any specific questions about what was found during the examination.  If the procedure report does not answer your questions, please call your gastroenterologist to clarify.  If you requested that your care partner not be given the details of your procedure findings, then the procedure report has been included in a sealed envelope for you to review at your convenience later.  YOU SHOULD EXPECT: Some feelings of bloating in the abdomen. Passage of more gas than usual.  Walking can help get rid of the air that was put into your GI tract during the procedure and reduce the bloating. If you had a lower endoscopy (such as a colonoscopy or flexible sigmoidoscopy) you may notice spotting of blood in your stool or on the toilet paper. If you underwent a bowel prep for your procedure, you may not have a normal bowel movement for a few days.  Please Note:  You might notice some irritation and congestion in your nose or some drainage.  This is from the oxygen used during your procedure.  There is no need for concern and it should clear up in a day or so.  SYMPTOMS TO REPORT IMMEDIATELY:   Following lower endoscopy (colonoscopy or flexible sigmoidoscopy):  Excessive amounts of blood in the stool  Significant tenderness or worsening of abdominal pains  Swelling of the abdomen that is new, acute  Fever of 100F or higher  For urgent or emergent issues, a gastroenterologist can be reached at any hour by  calling [phone].   DIET:  We do recommend a small meal at first, but then you may proceed to your regular diet.  Drink plenty of fluids but you should avoid alcoholic beverages for 24 hours.  ACTIVITY:  You should plan to take it easy for the rest of today and you should NOT DRIVE or use heavy machinery until tomorrow (because of the sedation medicines used during the test).    FOLLOW UP: Our staff will call the number listed on your records 48-72 hours following your procedure to check on you and address any questions or concerns that you may have regarding the information given to you following your procedure. If we do not reach you, we will leave a message.  We will attempt to reach you two times.  During this call, we will ask if you have developed any symptoms of COVID 19. If you develop any symptoms (ie: fever, flu-like symptoms, shortness of breath, cough etc.) before then, please call [phone].  If you test positive for Covid 19 in the 2 weeks post procedure, please call and report this information to [place].    If any biopsies were taken you will be contacted by phone or by letter within the next 1-3 weeks.  Please call us at [phone] if you have not heard about the biopsies in 3 weeks.    SIGNATURES/CONFIDENTIALITY: You and/or your care partner  have signed paperwork which will be entered into your electronic medical record.  These signatures attest to the fact that that the information above on your After Visit Summary has been reviewed and is understood.  Full responsibility of the confidentiality of this discharge information lies with you and/or your care-partner.

## 2019-04-30 NOTE — Progress Notes (Signed)
A and O x3. Report to RN. Tolerated MAC anesthesia well.

## 2019-04-30 NOTE — Progress Notes (Signed)
Called to room to assist during endoscopic procedure.  Patient ID and intended procedure confirmed with present staff. Received instructions for my participation in the procedure from the performing physician.  

## 2019-04-30 NOTE — Op Note (Signed)
[place] Patient Name: Catherine Ramsey Procedure Date: 04/30/2019 11:19 AM MRN: [id number] Endoscopist: Gatha Mayer , MD Age: 73 Referring MD:  Date of Birth: 08-Oct-1945 Gender: Female Account #: [id number] Procedure:                Colonoscopy Indications:              Surveillance: Personal history of adenomatous                            polyps on last colonoscopy 5 years ago Medicines:                Propofol per Anesthesia, Monitored Anesthesia Care Procedure:                Pre-Anesthesia Assessment:                           - Prior to the procedure, a History and Physical                            was performed, and patient medications and                            allergies were reviewed. The patient's tolerance of                            previous anesthesia was also reviewed. The risks                            and benefits of the procedure and the sedation                            options and risks were discussed with the patient.                            All questions were answered, and informed consent                            was obtained. Prior Anticoagulants: The patient has                            taken no previous anticoagulant or antiplatelet                            agents. ASA Grade Assessment: III - A patient with                            severe systemic disease. After reviewing the risks                            and benefits, the patient was deemed in                            satisfactory condition to undergo the procedure.  After obtaining informed consent, the colonoscope                            was passed under direct vision. Throughout the                            procedure, the patient's blood pressure, pulse, and                            oxygen saturations were monitored continuously. The                            Colonoscope was introduced through the anus and    advanced to the the cecum, identified by                            appendiceal orifice and ileocecal valve. The                            quality of the bowel preparation was good. The                            bowel preparation used was Miralax via split dose                            instruction. The ileocecal valve, appendiceal                            orifice, and rectum were photographed. The                            colonoscopy was somewhat difficult due to                            significant looping. Successful completion of the                            procedure was aided by straightening and shortening                            the scope to obtain bowel loop reduction. The                            patient tolerated the procedure well. Scope In: 11:30:43 AM Scope Out: 11:58:31 AM Scope Withdrawal Time: 0 hours 21 minutes 45 seconds  Total Procedure Duration: 0 hours 27 minutes 48 seconds  Findings:                 The perianal and digital rectal examinations were                            normal.                           Five sessile polyps were found in the descending  colon, transverse colon and ascending colon. The                            polyps were diminutive in size. These polyps were                            removed with a cold snare. Resection and retrieval                            were complete. Verification of patient                            identification for the specimen was done. Estimated                            blood loss was minimal.                           Two sessile polyps were found in the transverse                            colon and cecum. The polyps were 1 to 2 mm in size.                            These polyps were removed with a cold biopsy                            forceps. Resection and retrieval were complete.                            Verification of patient identification for the                             specimen was done. Estimated blood loss was minimal.                           The exam was otherwise without abnormality on                            direct and retroflexion views. Complications:            No immediate complications. Estimated Blood Loss:     Estimated blood loss was minimal. Impression:               - Five diminutive polyps in the descending colon,                            in the transverse colon and in the ascending colon,                            removed with a cold snare. Resected and retrieved.                           - Two 1 to 2  mm polyps in the transverse colon and                            in the cecum, removed with a cold biopsy forceps.                            Resected and retrieved.                           - The examination was otherwise normal on direct                            and retroflexion views.                           - Personal history of colonic polyps. Daughter had                            61 polyps on her exam also Recommendation:           - Patient has a contact number available for                            emergencies. The signs and symptoms of potential                            delayed complications were discussed with the                            patient. Return to normal activities tomorrow.                            Written discharge instructions were provided to the                            patient.                           - Resume previous diet.                           - Continue present medications.                           - Repeat colonoscopy is recommended for                            surveillance. The colonoscopy date will be                            determined after pathology results from today's                            exam become available for review.                           -  Need to consider EGD to f/u achalasia and screen                            for  dysplasia/cance                           Will review chart and discuss Gatha Mayer, MD 04/30/2019 12:08:04 PM This report has been signed electronically.

## 2019-04-30 NOTE — Progress Notes (Signed)
Temp check by KA/vital check by CW.  Patient states no changes in medical or surgical history since pre-visit screening on 04/18/19.

## 2019-05-02 ENCOUNTER — Telehealth: Payer: Self-pay

## 2019-05-02 NOTE — Telephone Encounter (Signed)
  Follow up Call-  Call back number 04/30/2019  Post procedure Call Back phone  # [phone]  Permission to leave phone message Yes  Some recent data might be hidden     Patient questions:  Do you have a fever, pain , or abdominal swelling? No. Pain Score  0 *  Have you tolerated food without any problems? Yes.    Have you been able to return to your normal activities? Yes.    Do you have any questions about your discharge instructions: Diet   No. Medications  No. Follow up visit  No.  Do you have questions or concerns about your Care? No.  Actions: * If pain score is 4 or above: No action needed, pain <4.  1. Have you developed a fever since your procedure? no  2.   Have you had an respiratory symptoms (SOB or cough) since your procedure? no  3.   Have you tested positive for COVID 19 since your procedure no  4.   Have you had any family members/close contacts diagnosed with the COVID 19 since your procedure?  no   If yes to any of these questions please route to Joylene John, RN and Alphonsa Gin, Therapist, sports.

## 2019-05-10 ENCOUNTER — Telehealth: Payer: Self-pay | Admitting: Internal Medicine

## 2019-05-10 ENCOUNTER — Encounter: Payer: Self-pay | Admitting: Internal Medicine

## 2019-05-10 ENCOUNTER — Encounter: Payer: Medicare HMO | Admitting: Physical Medicine and Rehabilitation

## 2019-05-10 DIAGNOSIS — K220 Achalasia of cardia: Secondary | ICD-10-CM

## 2019-05-10 NOTE — Telephone Encounter (Signed)
Left message that I would call back to discuss possible EGD for achalasia

## 2019-05-10 NOTE — Progress Notes (Signed)
7 adenomas Recall colon 3 years My Chart letter

## 2019-05-17 ENCOUNTER — Ambulatory Visit: Payer: Medicare HMO | Admitting: Podiatry

## 2019-05-17 ENCOUNTER — Other Ambulatory Visit: Payer: Self-pay

## 2019-05-17 DIAGNOSIS — E1169 Type 2 diabetes mellitus with other specified complication: Secondary | ICD-10-CM | POA: Diagnosis not present

## 2019-05-17 DIAGNOSIS — B351 Tinea unguium: Secondary | ICD-10-CM | POA: Diagnosis not present

## 2019-05-17 DIAGNOSIS — E1142 Type 2 diabetes mellitus with diabetic polyneuropathy: Secondary | ICD-10-CM

## 2019-05-18 DIAGNOSIS — E1151 Type 2 diabetes mellitus with diabetic peripheral angiopathy without gangrene: Secondary | ICD-10-CM | POA: Diagnosis not present

## 2019-05-18 DIAGNOSIS — E7849 Other hyperlipidemia: Secondary | ICD-10-CM | POA: Diagnosis not present

## 2019-05-24 NOTE — Telephone Encounter (Signed)
Called patient  Explained no recommendation for endoscopic surveillance for esoph Ca  She is stable w/ some chronic mild dysphagia  We will plan annual OV and contact me sooner prn change in swallowing

## 2019-05-28 ENCOUNTER — Ambulatory Visit: Payer: Medicare HMO | Admitting: Orthotics

## 2019-05-28 ENCOUNTER — Other Ambulatory Visit: Payer: Self-pay

## 2019-05-28 DIAGNOSIS — E1142 Type 2 diabetes mellitus with diabetic polyneuropathy: Secondary | ICD-10-CM

## 2019-05-28 DIAGNOSIS — M2042 Other hammer toe(s) (acquired), left foot: Secondary | ICD-10-CM

## 2019-05-28 DIAGNOSIS — M205X2 Other deformities of toe(s) (acquired), left foot: Secondary | ICD-10-CM

## 2019-05-28 NOTE — Progress Notes (Signed)

## 2019-06-15 DIAGNOSIS — M545 Low back pain: Secondary | ICD-10-CM | POA: Diagnosis not present

## 2019-06-15 DIAGNOSIS — N1831 Chronic kidney disease, stage 3a: Secondary | ICD-10-CM | POA: Diagnosis not present

## 2019-06-15 DIAGNOSIS — I10 Essential (primary) hypertension: Secondary | ICD-10-CM | POA: Diagnosis not present

## 2019-06-15 DIAGNOSIS — E785 Hyperlipidemia, unspecified: Secondary | ICD-10-CM | POA: Diagnosis not present

## 2019-06-15 DIAGNOSIS — Z0000 Encounter for general adult medical examination without abnormal findings: Secondary | ICD-10-CM | POA: Diagnosis not present

## 2019-06-15 DIAGNOSIS — I739 Peripheral vascular disease, unspecified: Secondary | ICD-10-CM | POA: Diagnosis not present

## 2019-06-15 DIAGNOSIS — I129 Hypertensive chronic kidney disease with stage 1 through stage 4 chronic kidney disease, or unspecified chronic kidney disease: Secondary | ICD-10-CM | POA: Diagnosis not present

## 2019-06-15 DIAGNOSIS — E1151 Type 2 diabetes mellitus with diabetic peripheral angiopathy without gangrene: Secondary | ICD-10-CM | POA: Diagnosis not present

## 2019-06-15 DIAGNOSIS — K220 Achalasia of cardia: Secondary | ICD-10-CM | POA: Diagnosis not present

## 2019-06-22 ENCOUNTER — Other Ambulatory Visit: Payer: Self-pay

## 2019-06-22 ENCOUNTER — Ambulatory Visit (INDEPENDENT_AMBULATORY_CARE_PROVIDER_SITE_OTHER): Payer: Medicare HMO | Admitting: Cardiovascular Disease

## 2019-06-22 ENCOUNTER — Encounter: Payer: Self-pay | Admitting: Cardiovascular Disease

## 2019-06-22 VITALS — BP 177/76 | HR 67 | Temp 96.8°F | Ht 67.0 in | Wt 202.4 lb

## 2019-06-22 DIAGNOSIS — E669 Obesity, unspecified: Secondary | ICD-10-CM

## 2019-06-22 DIAGNOSIS — E7800 Pure hypercholesterolemia, unspecified: Secondary | ICD-10-CM

## 2019-06-22 DIAGNOSIS — E1169 Type 2 diabetes mellitus with other specified complication: Secondary | ICD-10-CM | POA: Diagnosis not present

## 2019-06-22 DIAGNOSIS — I10 Essential (primary) hypertension: Secondary | ICD-10-CM | POA: Diagnosis not present

## 2019-06-22 DIAGNOSIS — E785 Hyperlipidemia, unspecified: Secondary | ICD-10-CM | POA: Insufficient documentation

## 2019-06-22 NOTE — Patient Instructions (Signed)
Medication Instructions:   Your physician recommends that you continue on your current medications as directed. Please refer to the Current Medication list given to you today.  *If you need a refill on your cardiac medications before your next appointment, please call your pharmacy*  Lab Work:  None ordered today   Testing/Procedures:  None ordered today  Follow-Up: At [facility], you and your health needs are our priority.  As part of our continuing mission to provide you with exceptional heart care, we have created designated Provider Care Teams.  These Care Teams include your primary Cardiologist (physician) and Advanced Practice Providers (APPs -  Physician Assistants and Nurse Practitioners) who all work together to provide you with the care you need, when you need it.  Your next appointment:   12 month(s)  The format for your next appointment:   In Person  Provider:   You may see Sanda Klein, MD or one of the following Advanced Practice Providers on your designated Care Team:    Almyra Deforest, PA-C  Fabian Sharp, [place] or   Roby Lofts, [place]   Other Instructions  Check your blood pressures once a day for 1 week and send the readings through mychart.

## 2019-06-22 NOTE — Progress Notes (Signed)
Cardiology Office Note:    Date:  06/22/2019   ID:  ALYX KEPLEY, DOB [birth year], MRN [id number]  PCP:  Leanna Battles, MD  Cardiologist:  Sanda Klein, MD  Electrophysiologist:  None   Referring MD: Leanna Battles, MD   Chief Complaint  Patient presents with   Loss of Consciousness    History of Present Illness:    Catherine Ramsey is a 73 y.o. female with a hx of 2 episodes of syncope during acute coronavirus infection and reduced intake of food and fluids.  She was noted to have sinus bradycardia while hospitalized, but had been chronically on beta-blockers.  She has recovered well from the acute viral infection.  Kidney function which was markedly abnormal has returned to baseline.  An echocardiogram performed in July during the admission showed normal left ventricular systolic function and no significant valvular abnormalities.  VQ scan was negative for pulmonary embolism.  Chronic problems include systemic hypertension, hypercholesterolemia and type 2 diabetes mellitus, achalasia. She is no longer taking a beta blocker.  During the month of September she was checking her blood pressure daily and was consistently in normal range, typically 110-120/60-70.  She has not been checking the last couple of months.  Today her blood pressure is elevated, but she had a hard time finding our office.  The patient specifically denies any chest pain at rest exertion, dyspnea at rest or with exertion, orthopnea, paroxysmal nocturnal dyspnea, dizziness or syncope, palpitations, focal neurological deficits, intermittent claudication, lower extremity edema, unexplained weight gain, cough, hemoptysis or wheezing.  Unfortunately, her 71 year old mother died from COVID-78 during the time they were both hospitalized. Her husband Crist Infante") is also my patient.  Past Medical History:  Diagnosis Date   Achalasia - Type II 05/08/2010   Qualifier: Diagnosis of  By: Carlean Purl MD, Tonna Boehringer E     Acute kidney injury ([place]) 02/26/2019   Candida esophagitis ([place]) 03/26/2014   COVID-19 virus detected 02/25/2019   Diabetes mellitus    type 2   Family history of adverse reaction to anesthesia    son hard to wake up   Hypertension    Syncope 02/25/2019   Trigger ring finger of right hand 10/07/2016    Past Surgical History:  Procedure Laterality Date   ABDOMINAL HYSTERECTOMY     BREAST BIOPSY     left/benign   COLONOSCOPY  01/31/2014   ESOPHAGEAL MANOMETRY N/A 04/01/2014   Procedure: ESOPHAGEAL MANOMETRY (EM);  Surgeon: Gatha Mayer, MD;  Location: [facility] ENDOSCOPY;  Service: Endoscopy;  Laterality: N/A;   HELLER MYOTOMY N/A 12/07/2016   Procedure: LAPAROSCOPIC HELLER MYOTOMY, DOR FUNDOPLICATION, HIATAL HERNIA REPAIR, INTRAOPERATIVE UPPER ENDOSCOPY;  Surgeon: Jackolyn Confer, MD;  Location: [facility] ORS;  Service: General;  Laterality: N/A;   TOTAL HIP ARTHROPLASTY     right pin then replacement    Current Medications: Current Meds  Medication Sig   acetaminophen (TYLENOL) 325 MG tablet Take 2 tablets (650 mg total) by mouth every 6 (six) hours as needed for mild pain (or Fever >/= 101).   amLODipine (NORVASC) 10 MG tablet Take 10 mg by mouth daily.   CINNAMON PO Take by mouth.   gabapentin (NEURONTIN) 300 MG capsule Take 300 mg by mouth daily as needed (pain).   insulin aspart (NOVOLOG FLEXPEN) 100 UNIT/ML FlexPen Inject 8 Units into the skin 3 (three) times daily with meals. Sliding scale   insulin glargine (LANTUS) 100 UNIT/ML injection Inject 20 Units into the skin daily.  KLOR-CON M20 20 MEQ tablet Take 20 mEq by mouth daily.   losartan-hydrochlorothiazide (HYZAAR) 100-25 MG tablet Take 1 tablet by mouth daily.   pravastatin (PRAVACHOL) 40 MG tablet Take 40 mg by mouth at bedtime.     Allergies:   Patient has no known allergies.   Social History   Socioeconomic History   Marital status: Married    Spouse name: Not on file   Number of children: Not on file    Years of education: Not on file   Highest education level: Not on file  Occupational History   Not on file  Social Needs   Financial resource strain: Not on file   Food insecurity    Worry: Not on file    Inability: Not on file   Transportation needs    Medical: Not on file    Non-medical: Not on file  Tobacco Use   Smoking status: Never Smoker   Smokeless tobacco: Never Used  Substance and Sexual Activity   Alcohol use: No   Drug use: No   Sexual activity: Yes  Lifestyle   Physical activity    Days per week: Not on file    Minutes per session: Not on file   Stress: Not on file  Relationships   Social connections    Talks on phone: Not on file    Gets together: Not on file    Attends religious service: Not on file    Active member of club or organization: Not on file    Attends meetings of clubs or organizations: Not on file    Relationship status: Not on file  Other Topics Concern   Not on file  Social History Narrative   Not on file     Family History: The patient's family history includes Breast cancer in her mother; Colon polyps in her daughter; Diabetes in her father and mother; Hyperlipidemia in her mother; Hypertension in her father, mother, sister, and sister. There is no history of Colon cancer, Esophageal cancer, Rectal cancer, or Stomach cancer.  ROS:   Please see the history of present illness.     All other systems reviewed and are negative.  EKGs/Labs/Other Studies Reviewed:    The following studies were reviewed today: ECHO 02/26/2019  EKG:  EKG is not ordered today.  The ekg ordered 03/02/2019 demonstrates SR with PACs, otw normal.  Recent Labs: 02/26/2019: TSH 2.590 02/28/2019: ALT 18; Hemoglobin 12.5; Magnesium 1.0; Platelets 180 03/26/2019: BUN 11; Creatinine, Ser 1.14; Potassium 4.6; Sodium 143  Recent Lipid Panel No results found for: CHOL, TRIG, HDL, CHOLHDL, VLDL, LDLCALC, LDLDIRECT   05/18/2019 [place]  medical Hemoglobin A1c 8% Normal liver function tests, creatinine 1.3 Total cholesterol 137, HDL 36, LDL 80 Hemoglobin 11.3  Physical Exam:    VS:  BP (!) 177/76 (BP Location: Right Arm)    Pulse 67    Temp (!) 96.8 F (36 C)    Ht 5\' 7"  (1.702 m)    Wt 202 lb 6.4 oz (91.8 kg)    SpO2 92%    BMI 31.70 kg/m     Wt Readings from Last 3 Encounters:  06/22/19 202 lb 6.4 oz (91.8 kg)  04/30/19 205 lb (93 kg)  04/23/19 205 lb (93 kg)     GEN: Obese. Well nourished, well developed in no acute distress HEENT: Normal NECK: No JVD; No carotid bruits LYMPHATICS: No lymphadenopathy CARDIAC: RRR with occasional ectopy, no murmurs, rubs, gallops RESPIRATORY:  Clear to auscultation without rales,  wheezing or rhonchi  ABDOMEN: Soft, non-tender, non-distended MUSCULOSKELETAL:  No edema; No deformity  SKIN: Warm and dry NEUROLOGIC:  Alert and oriented x 3 PSYCHIATRIC:  Normal affect   ASSESSMENT:    1. Essential hypertension   2. Hypercholesterolemia   3. Diabetes mellitus type 2 in obese [facility])    PLAN:    In order of problems listed above:  1. HTN: Probably due to hypotension in the setting of hypovolemia/COVID-19 infection.  Beta-blocker was probably not helping by causing bradycardia and preventing compensatory increase in heart rate.  She kept a very detailed log of her blood pressure in September and this was consistently normal range (110-130/60-70), but her blood pressure is high today.  She was nervous coming to the office today since she only realized it was a young person visit late in the morning and then got lost in the office building.  I asked her to check her blood pressure once daily at home for a week and send me the results through my chart.  We may need to add back a low-dose of beta-blocker if her blood pressure is indeed high.  She is already on maximum usual doses of amlodipine, losartan, hydrochlorothiazide. 2. HLP: No symptoms or clinical exam evidence of CAD/PAD.  She  does have diabetes mellitus;  target LDL is less than 100. 3. DM: Sees Dr. Philip Aspen at [facility].  Most recent A1c was mildly elevated at 8%.  Recommend weight loss, regular exercise, diet low in saturated fats and carbohydrates with high glycemic index, rich in unsaturated fat and protein.   Medication Adjustments/Labs and Tests Ordered: Current medicines are reviewed at length with the patient today.  Concerns regarding medicines are outlined above.  No orders of the defined types were placed in this encounter.  No orders of the defined types were placed in this encounter.   Patient Instructions  Medication Instructions:   Your physician recommends that you continue on your current medications as directed. Please refer to the Current Medication list given to you today.  *If you need a refill on your cardiac medications before your next appointment, please call your pharmacy*  Lab Work:  None ordered today   Testing/Procedures:  None ordered today  Follow-Up: At [facility], you and your health needs are our priority.  As part of our continuing mission to provide you with exceptional heart care, we have created designated Provider Care Teams.  These Care Teams include your primary Cardiologist (physician) and Advanced Practice Providers (APPs -  Physician Assistants and Nurse Practitioners) who all work together to provide you with the care you need, when you need it.  Your next appointment:   12 month(s)  The format for your next appointment:   In Person  Provider:   You may see Sanda Klein, MD or one of the following Advanced Practice Providers on your designated Care Team:    Almyra Deforest, PA-C  Fabian Sharp, [place] or   Roby Lofts, [place]   Other Instructions  Check your blood pressures once a day for 1 week and send the readings through mychart.      Signed, Sanda Klein, MD  06/22/2019 11:33 AM    [facility]

## 2019-08-17 ENCOUNTER — Other Ambulatory Visit: Payer: Self-pay

## 2019-08-17 ENCOUNTER — Ambulatory Visit: Payer: Medicare HMO | Admitting: Orthotics

## 2019-08-17 DIAGNOSIS — M2042 Other hammer toe(s) (acquired), left foot: Secondary | ICD-10-CM | POA: Diagnosis not present

## 2019-08-17 DIAGNOSIS — E1140 Type 2 diabetes mellitus with diabetic neuropathy, unspecified: Secondary | ICD-10-CM | POA: Diagnosis not present

## 2019-08-17 DIAGNOSIS — M2041 Other hammer toe(s) (acquired), right foot: Secondary | ICD-10-CM | POA: Diagnosis not present

## 2019-08-24 ENCOUNTER — Other Ambulatory Visit: Payer: Self-pay

## 2019-08-24 ENCOUNTER — Ambulatory Visit: Payer: Medicare HMO | Admitting: Orthotics

## 2019-08-24 DIAGNOSIS — E1142 Type 2 diabetes mellitus with diabetic polyneuropathy: Secondary | ICD-10-CM

## 2019-08-24 DIAGNOSIS — M2042 Other hammer toe(s) (acquired), left foot: Secondary | ICD-10-CM

## 2019-08-24 NOTE — Progress Notes (Signed)
Ordered shoe lace up to make better fit

## 2019-09-17 DIAGNOSIS — I739 Peripheral vascular disease, unspecified: Secondary | ICD-10-CM | POA: Diagnosis not present

## 2019-09-17 DIAGNOSIS — M545 Low back pain: Secondary | ICD-10-CM | POA: Diagnosis not present

## 2019-09-17 DIAGNOSIS — E1151 Type 2 diabetes mellitus with diabetic peripheral angiopathy without gangrene: Secondary | ICD-10-CM | POA: Diagnosis not present

## 2019-09-17 DIAGNOSIS — Z794 Long term (current) use of insulin: Secondary | ICD-10-CM | POA: Diagnosis not present

## 2019-09-17 DIAGNOSIS — K220 Achalasia of cardia: Secondary | ICD-10-CM | POA: Diagnosis not present

## 2019-09-17 DIAGNOSIS — I129 Hypertensive chronic kidney disease with stage 1 through stage 4 chronic kidney disease, or unspecified chronic kidney disease: Secondary | ICD-10-CM | POA: Diagnosis not present

## 2019-09-17 DIAGNOSIS — N1831 Chronic kidney disease, stage 3a: Secondary | ICD-10-CM | POA: Diagnosis not present

## 2019-10-05 ENCOUNTER — Ambulatory Visit: Payer: Medicare HMO | Admitting: Podiatry

## 2019-10-05 ENCOUNTER — Other Ambulatory Visit: Payer: Self-pay

## 2019-10-05 ENCOUNTER — Ambulatory Visit: Payer: Medicare HMO | Admitting: Orthotics

## 2019-10-12 ENCOUNTER — Other Ambulatory Visit: Payer: Self-pay

## 2019-10-12 ENCOUNTER — Ambulatory Visit (INDEPENDENT_AMBULATORY_CARE_PROVIDER_SITE_OTHER): Payer: Medicare HMO | Admitting: Podiatry

## 2019-10-12 DIAGNOSIS — E1169 Type 2 diabetes mellitus with other specified complication: Secondary | ICD-10-CM

## 2019-10-12 DIAGNOSIS — B351 Tinea unguium: Secondary | ICD-10-CM

## 2019-10-12 DIAGNOSIS — E1142 Type 2 diabetes mellitus with diabetic polyneuropathy: Secondary | ICD-10-CM

## 2019-10-12 NOTE — Progress Notes (Signed)
  Subjective:  Patient ID: Catherine Ramsey, female    DOB: 02-02-1946,  MRN: [id number]  Chief Complaint  Patient presents with  . Nail Problem    Nail trim 1-5 bilateral    74 y.o. female presents with the above complaint. History confirmed with patient.   Objective:  Physical Exam: warm, good capillary refill, nail exam onychomycosis of the toenails, no trophic changes or ulcerative lesions. DP pulses palpable, PT pulses palpable and protective sensation absent Left Foot: normal exam, no swelling, tenderness, instability; ligaments intact, full range of motion of all ankle/foot joints  Right Foot: normal exam, no swelling, tenderness, instability; ligaments intact, full range of motion of all ankle/foot joints   No images are attached to the encounter.  Assessment:   1. Onychomycosis of multiple toenails with type 2 diabetes mellitus and peripheral neuropathy ([place])      Plan:  Patient was evaluated and treated and all questions answered.  Onychomycosis and DPN -Patient is diabetic with a qualifying condition for at risk foot care.  Procedure: Nail Debridement Rationale: Patient meets criteria for routine foot care due to DPN Type of Debridement: manual, sharp debridement. Instrumentation: Nail nipper, rotary burr. Number of Nails: 10    No follow-ups on file.

## 2019-10-12 NOTE — Progress Notes (Signed)
  Subjective:  Patient ID: Catherine Ramsey, female    DOB: 02-29-1946,  MRN: [id number]  Chief Complaint  Patient presents with  . Diabetes Mellitus    Diabetic foot exam. Pt interested in diabetic shoes.  . Nail Problem    Nail trim 1-5 bilateral    74 y.o. female presents with the above complaint. Hx as above.  Review of Systems: Negative except as noted in the HPI. Denies N/V/F/Ch.  Past Medical History:  Diagnosis Date  . Achalasia - Type II 05/08/2010   Qualifier: Diagnosis of  By: Carlean Purl MD, Dimas Millin Acute kidney injury ([place]) 02/26/2019  . Candida esophagitis ([place]) 03/26/2014  . COVID-19 virus detected 02/25/2019  . Diabetes mellitus    type 2  . Family history of adverse reaction to anesthesia    son hard to wake up  . Hypertension   . Syncope 02/25/2019  . Trigger ring finger of right hand 10/07/2016    Current Outpatient Medications:  .  acetaminophen (TYLENOL) 325 MG tablet, Take 2 tablets (650 mg total) by mouth every 6 (six) hours as needed for mild pain (or Fever >/= 101)., Disp: , Rfl:  .  amLODipine (NORVASC) 10 MG tablet, Take 10 mg by mouth daily., Disp: , Rfl:  .  CINNAMON PO, Take by mouth., Disp: , Rfl:  .  gabapentin (NEURONTIN) 300 MG capsule, Take 300 mg by mouth daily as needed (pain)., Disp: , Rfl:  .  insulin aspart (NOVOLOG FLEXPEN) 100 UNIT/ML FlexPen, Inject 8 Units into the skin 3 (three) times daily with meals. Sliding scale, Disp: , Rfl:  .  insulin glargine (LANTUS) 100 UNIT/ML injection, Inject 20 Units into the skin daily., Disp: , Rfl:  .  KLOR-CON M20 20 MEQ tablet, Take 20 mEq by mouth daily., Disp: , Rfl: 0 .  losartan-hydrochlorothiazide (HYZAAR) 100-25 MG tablet, Take 1 tablet by mouth daily., Disp: , Rfl:  .  pravastatin (PRAVACHOL) 40 MG tablet, Take 40 mg by mouth at bedtime., Disp: , Rfl:  .  atenolol (TENORMIN) 25 MG tablet, , Disp: , Rfl:   Social History   Tobacco Use  Smoking Status Never Smoker  Smokeless Tobacco Never  Used    No Known Allergies Objective:  There were no vitals filed for this visit. There is no height or weight on file to calculate BMI. Constitutional Well developed. Well nourished.  Vascular Dorsalis pedis pulses palpable bilaterally. Posterior tibial pulses palpable bilaterally. Capillary refill normal to all digits.  No cyanosis or clubbing noted. Pedal hair growth normal.  Neurologic Normal speech. Oriented to person, place, and time. Epicritic sensation to light touch grossly present bilaterally.  Dermatologic Painful ingrowing nail at medial nail borders of the hallux nail left. No other open wounds. No skin lesions.  Orthopedic: Normal joint ROM without pain or crepitus bilaterally. No visible deformities. No bony tenderness.   Radiographs: None Assessment:   1. Onychomycosis of multiple toenails with type 2 diabetes mellitus and peripheral neuropathy ([place])    Plan:  Patient was evaluated and treated and all questions answered.  DM with ONyhco, DPN -Nails debrided x10   Procedure: Nail Debridement Rationale: Patient meets criteria for routine foot care due to DPN Type of Debridement: manual, sharp debridement. Instrumentation: Nail nipper, rotary burr. Number of Nails: 10    Return in about 3 months (around 08/17/2019).

## 2020-01-11 ENCOUNTER — Encounter: Payer: Self-pay | Admitting: Podiatry

## 2020-01-11 ENCOUNTER — Ambulatory Visit (INDEPENDENT_AMBULATORY_CARE_PROVIDER_SITE_OTHER): Payer: Medicare HMO | Admitting: Podiatry

## 2020-01-11 ENCOUNTER — Other Ambulatory Visit: Payer: Self-pay

## 2020-01-11 DIAGNOSIS — E1142 Type 2 diabetes mellitus with diabetic polyneuropathy: Secondary | ICD-10-CM | POA: Diagnosis not present

## 2020-01-11 DIAGNOSIS — E1169 Type 2 diabetes mellitus with other specified complication: Secondary | ICD-10-CM

## 2020-01-11 DIAGNOSIS — B351 Tinea unguium: Secondary | ICD-10-CM | POA: Diagnosis not present

## 2020-01-11 DIAGNOSIS — M2042 Other hammer toe(s) (acquired), left foot: Secondary | ICD-10-CM | POA: Diagnosis not present

## 2020-01-11 NOTE — Progress Notes (Signed)
This patient returns to my office for at risk foot care.  This patient requires this care by a professional since this patient will be at risk due to having diabetes.  This patient is unable to cut nails herself since the patient cannot reach her nails.These nails are painful walking and wearing shoes.  This patient presents for at risk foot care today.  General Appearance  Alert, conversant and in no acute stress.  Vascular  Dorsalis pedis and posterior tibial  pulses are palpable  bilaterally.  Capillary return is within normal limits  bilaterally. Temperature is within normal limits  bilaterally.  Neurologic  Senn-Weinstein monofilament wire test absent  bilaterally. Muscle power within normal limits bilaterally.  Nails Thick disfigured discolored nails with subungual debris  from hallux to fifth toes bilaterally. No evidence of bacterial infection or drainage bilaterally.  Orthopedic  No limitations of motion  feet .  No crepitus or effusions noted.  No bony pathology or digital deformities noted.  Skin  normotropic skin with no porokeratosis noted bilaterally.  No signs of infections or ulcers noted.     Onychomycosis  Pain in right toes  Pain in left toes  Consent was obtained for treatment procedures.   Mechanical debridement of nails 1-5  bilaterally performed with a nail nipper.  Filed with dremel without incident.    Return office visit   3 months                  Told patient to return for periodic foot care and evaluation due to potential at risk complications.   Gardiner Barefoot DPM

## 2020-02-11 DIAGNOSIS — I129 Hypertensive chronic kidney disease with stage 1 through stage 4 chronic kidney disease, or unspecified chronic kidney disease: Secondary | ICD-10-CM | POA: Diagnosis not present

## 2020-02-11 DIAGNOSIS — E785 Hyperlipidemia, unspecified: Secondary | ICD-10-CM | POA: Diagnosis not present

## 2020-02-11 DIAGNOSIS — Z1331 Encounter for screening for depression: Secondary | ICD-10-CM | POA: Diagnosis not present

## 2020-02-11 DIAGNOSIS — M545 Low back pain: Secondary | ICD-10-CM | POA: Diagnosis not present

## 2020-02-11 DIAGNOSIS — K220 Achalasia of cardia: Secondary | ICD-10-CM | POA: Diagnosis not present

## 2020-02-11 DIAGNOSIS — E1151 Type 2 diabetes mellitus with diabetic peripheral angiopathy without gangrene: Secondary | ICD-10-CM | POA: Diagnosis not present

## 2020-02-11 DIAGNOSIS — N1831 Chronic kidney disease, stage 3a: Secondary | ICD-10-CM | POA: Diagnosis not present

## 2020-02-11 DIAGNOSIS — I739 Peripheral vascular disease, unspecified: Secondary | ICD-10-CM | POA: Diagnosis not present

## 2020-02-11 DIAGNOSIS — Z794 Long term (current) use of insulin: Secondary | ICD-10-CM | POA: Diagnosis not present

## 2020-02-13 ENCOUNTER — Encounter: Payer: Self-pay | Admitting: Internal Medicine

## 2020-03-12 DIAGNOSIS — N1831 Chronic kidney disease, stage 3a: Secondary | ICD-10-CM | POA: Diagnosis not present

## 2020-03-12 DIAGNOSIS — E1151 Type 2 diabetes mellitus with diabetic peripheral angiopathy without gangrene: Secondary | ICD-10-CM | POA: Diagnosis not present

## 2020-03-12 DIAGNOSIS — I129 Hypertensive chronic kidney disease with stage 1 through stage 4 chronic kidney disease, or unspecified chronic kidney disease: Secondary | ICD-10-CM | POA: Diagnosis not present

## 2020-03-12 DIAGNOSIS — Z794 Long term (current) use of insulin: Secondary | ICD-10-CM | POA: Diagnosis not present

## 2020-03-19 DIAGNOSIS — Z794 Long term (current) use of insulin: Secondary | ICD-10-CM | POA: Diagnosis not present

## 2020-03-19 DIAGNOSIS — E1151 Type 2 diabetes mellitus with diabetic peripheral angiopathy without gangrene: Secondary | ICD-10-CM | POA: Diagnosis not present

## 2020-03-19 DIAGNOSIS — N1831 Chronic kidney disease, stage 3a: Secondary | ICD-10-CM | POA: Diagnosis not present

## 2020-03-19 DIAGNOSIS — I129 Hypertensive chronic kidney disease with stage 1 through stage 4 chronic kidney disease, or unspecified chronic kidney disease: Secondary | ICD-10-CM | POA: Diagnosis not present

## 2020-04-02 DIAGNOSIS — E1151 Type 2 diabetes mellitus with diabetic peripheral angiopathy without gangrene: Secondary | ICD-10-CM | POA: Diagnosis not present

## 2020-04-02 DIAGNOSIS — I129 Hypertensive chronic kidney disease with stage 1 through stage 4 chronic kidney disease, or unspecified chronic kidney disease: Secondary | ICD-10-CM | POA: Diagnosis not present

## 2020-04-02 DIAGNOSIS — N1831 Chronic kidney disease, stage 3a: Secondary | ICD-10-CM | POA: Diagnosis not present

## 2020-04-02 DIAGNOSIS — Z794 Long term (current) use of insulin: Secondary | ICD-10-CM | POA: Diagnosis not present

## 2020-04-16 ENCOUNTER — Encounter: Payer: Self-pay | Admitting: Physician Assistant

## 2020-04-16 ENCOUNTER — Ambulatory Visit: Payer: Medicare HMO | Admitting: Internal Medicine

## 2020-04-16 ENCOUNTER — Ambulatory Visit (INDEPENDENT_AMBULATORY_CARE_PROVIDER_SITE_OTHER): Payer: Medicare HMO | Admitting: Physician Assistant

## 2020-04-16 VITALS — BP 134/64 | HR 76 | Ht 64.5 in | Wt 202.4 lb

## 2020-04-16 DIAGNOSIS — K220 Achalasia of cardia: Secondary | ICD-10-CM | POA: Diagnosis not present

## 2020-04-16 DIAGNOSIS — Z86010 Personal history of colonic polyps: Secondary | ICD-10-CM | POA: Diagnosis not present

## 2020-04-16 NOTE — Patient Instructions (Addendum)
If you are age 72 or older, your body mass index should be between 23-30. Your Body mass index is 34.2 kg/m. If this is out of the aforementioned range listed, please consider follow up with your Primary Care Provider.  If you are age 64 or younger, your body mass index should be between 19-25. Your Body mass index is 34.2 kg/m. If this is out of the aformentioned range listed, please consider follow up with your Primary Care Provider.   Follow up with Dr. Carlean Purl in 1 year or as needed.  Call the office with any issues.

## 2020-04-16 NOTE — Progress Notes (Signed)
Subjective:    Patient ID: Catherine Ramsey, female    DOB: Dec 07, 1945, 75 y.o.   MRN: [id number]  HPI Catherine Ramsey is a pleasant 74 year old female, known to Dr. Carlean Purl who comes in today for follow-up.  She has history of adenomatous and sessile serrated polyps.  She underwent colonoscopy in September 2020 with a total of 7 polyps removed.  All of these were tubular adenomas and she is indicated for 3-year interval follow-up. She has no current lower GI symptoms, occasional mild constipation. Also with history of achalasia for which he underwent work-up in 2017 and then underwent a laparoscopic Heller myotomy per Dr. Zella Richer in May 2018. She says that she has to be careful with meats particularly and chews her food very carefully.  Sometimes will have a sensation of dysphagia with corn or meat, but other than that has been doing well.  She has no complaints of heartburn or indigestion.  Appetite has been good, weight stable, no nausea or vomiting.  No episodes of dysphagia requiring regurgitation, and no sensation of food sitting for a prolonged period of time in her esophagus.  She feels overall she is doing well.  Review of Systems Pertinent positive and negative review of systems were noted in the above HPI section.  All other review of systems was otherwise negative.  Outpatient Encounter Medications as of 04/16/2020  Medication Sig  . acetaminophen (TYLENOL) 325 MG tablet Take 2 tablets (650 mg total) by mouth every 6 (six) hours as needed for mild pain (or Fever >/= 101).  . Alcohol Swabs (B-D SINGLE USE SWABS REGULAR) PADS   . amLODipine (NORVASC) 10 MG tablet Take 10 mg by mouth daily.  . Blood Glucose Monitoring Suppl (TRUE METRIX AIR GLUCOSE METER) w/Device KIT   . CINNAMON PO Take by mouth.  . gabapentin (NEURONTIN) 300 MG capsule Take 300 mg by mouth daily as needed (pain).  . insulin aspart (NOVOLOG FLEXPEN) 100 UNIT/ML FlexPen Inject 8 Units into the skin 3 (three) times daily with  meals. Sliding scale  . KLOR-CON M20 20 MEQ tablet Take 20 mEq by mouth daily.  Marland Kitchen LANTUS SOLOSTAR 100 UNIT/ML Solostar Pen   . losartan-hydrochlorothiazide (HYZAAR) 100-25 MG tablet Take 1 tablet by mouth daily.  . pravastatin (PRAVACHOL) 40 MG tablet Take 40 mg by mouth at bedtime.  . TRUE METRIX BLOOD GLUCOSE TEST test strip   . TRUEplus Lancets 33G MISC   . [DISCONTINUED] atenolol (TENORMIN) 25 MG tablet    No facility-administered encounter medications on file as of 04/16/2020.   No Known Allergies Patient Active Problem List   Diagnosis Date Noted  . Hypercholesterolemia 06/22/2019  . Diabetes mellitus type 2 in obese ([place]) 06/22/2019  . Diabetes mellitus due to underlying condition, uncontrolled, with hyperglycemia ([place])   . Essential hypertension 02/25/2019  . Insulin dependent diabetes mellitus 05/08/2010  . Achalasia - Type II 05/08/2010  . Personal history of colonic polyps - adenomas, TV adenomas, serrated polyps 05/08/2010   Social History   Socioeconomic History  . Marital status: Married    Spouse name: Not on file  . Number of children: Not on file  . Years of education: Not on file  . Highest education level: Not on file  Occupational History  . Not on file  Tobacco Use  . Smoking status: Never Smoker  . Smokeless tobacco: Never Used  Vaping Use  . Vaping Use: Never used  Substance and Sexual Activity  . Alcohol use: No  .  Drug use: No  . Sexual activity: Yes  Other Topics Concern  . Not on file  Social History Narrative  . Not on file   Social Determinants of Health   Financial Resource Strain:   . Difficulty of Paying Living Expenses: Not on file  Food Insecurity:   . Worried About Charity fundraiser in the Last Year: Not on file  . Ran Out of Food in the Last Year: Not on file  Transportation Needs:   . Lack of Transportation (Medical): Not on file  . Lack of Transportation (Non-Medical): Not on file  Physical Activity:   . Days of Exercise  per Week: Not on file  . Minutes of Exercise per Session: Not on file  Stress:   . Feeling of Stress : Not on file  Social Connections:   . Frequency of Communication with Friends and Family: Not on file  . Frequency of Social Gatherings with Friends and Family: Not on file  . Attends Religious Services: Not on file  . Active Member of Clubs or Organizations: Not on file  . Attends Archivist Meetings: Not on file  . Marital Status: Not on file  Intimate Partner Violence:   . Fear of Current or Ex-Partner: Not on file  . Emotionally Abused: Not on file  . Physically Abused: Not on file  . Sexually Abused: Not on file    Catherine Ramsey's family history includes Breast cancer in her mother; Colon polyps in her daughter; Diabetes in her father and mother; Hyperlipidemia in her mother; Hypertension in her father, mother, sister, and sister.      Objective:    Vitals:   04/16/20 1500  BP: 134/64  Pulse: 76    Physical Exam Well-developed well-nourished AA female in no acute distress.  Height, Weight, 202 BMI 34.2  HEENT; nontraumatic normocephalic, EOMI, PE R R LA, sclera anicteric. Oropharynx; not examined Neck; supple, no JVD Cardiovascular; regular rate and rhythm with S1-S2, no murmur rub or gallop Pulmonary; Clear bilaterally Abdomen; soft, nontender, nondistended, no palpable mass or hepatosplenomegaly, bowel sounds are active Rectal; not done today Skin; benign exam, no jaundice rash or appreciable lesions Extremities; no clubbing cyanosis or edema skin warm and dry Neuro/Psych; alert and oriented x4, grossly nonfocal mood and affect appropriate       Assessment & Plan:   #70 74 year old African-American female with history of achalasia subtype 2, status post laparoscopic Heller myotomy 2018 with good response. Patient has been doing well, minimal dysphagia symptoms primarily just to heavier meats which she avoids.  #2  History of multiple adenomatous  polyps-last colonoscopy September 2020 due for follow-up at 3-year interval. 3.  Insulin-dependent diabetes mellitus 4.  Hypertension 5.  Chronic kidney disease  Plan; I do not think patient needs any further work-up regarding achalasia at this time.  She was cautioned to call should she have any progressive symptoms of dysphagia. Follow-up colonoscopy 2023. She will follow up with Dr. Carlean Purl or myself on an as-needed basis.  Catherine Ramsey Genia Harold PA-C 04/16/2020   Cc: Leanna Battles, MD

## 2020-04-18 ENCOUNTER — Ambulatory Visit: Payer: Medicare HMO | Admitting: Podiatry

## 2020-06-24 DIAGNOSIS — E1165 Type 2 diabetes mellitus with hyperglycemia: Secondary | ICD-10-CM | POA: Diagnosis not present

## 2020-06-24 DIAGNOSIS — E785 Hyperlipidemia, unspecified: Secondary | ICD-10-CM | POA: Diagnosis not present

## 2020-06-25 DIAGNOSIS — E1151 Type 2 diabetes mellitus with diabetic peripheral angiopathy without gangrene: Secondary | ICD-10-CM | POA: Diagnosis not present

## 2020-06-30 DIAGNOSIS — K219 Gastro-esophageal reflux disease without esophagitis: Secondary | ICD-10-CM | POA: Diagnosis not present

## 2020-06-30 DIAGNOSIS — K220 Achalasia of cardia: Secondary | ICD-10-CM | POA: Diagnosis not present

## 2020-06-30 DIAGNOSIS — I739 Peripheral vascular disease, unspecified: Secondary | ICD-10-CM | POA: Diagnosis not present

## 2020-06-30 DIAGNOSIS — Z0000 Encounter for general adult medical examination without abnormal findings: Secondary | ICD-10-CM | POA: Diagnosis not present

## 2020-06-30 DIAGNOSIS — E1151 Type 2 diabetes mellitus with diabetic peripheral angiopathy without gangrene: Secondary | ICD-10-CM | POA: Diagnosis not present

## 2020-06-30 DIAGNOSIS — E785 Hyperlipidemia, unspecified: Secondary | ICD-10-CM | POA: Diagnosis not present

## 2020-06-30 DIAGNOSIS — I129 Hypertensive chronic kidney disease with stage 1 through stage 4 chronic kidney disease, or unspecified chronic kidney disease: Secondary | ICD-10-CM | POA: Diagnosis not present

## 2020-06-30 DIAGNOSIS — M1612 Unilateral primary osteoarthritis, left hip: Secondary | ICD-10-CM | POA: Diagnosis not present

## 2020-06-30 DIAGNOSIS — E669 Obesity, unspecified: Secondary | ICD-10-CM | POA: Diagnosis not present

## 2020-07-01 ENCOUNTER — Other Ambulatory Visit: Payer: Self-pay | Admitting: *Deleted

## 2020-07-01 ENCOUNTER — Other Ambulatory Visit (HOSPITAL_COMMUNITY): Payer: Self-pay | Admitting: Internal Medicine

## 2020-07-01 ENCOUNTER — Other Ambulatory Visit: Payer: Self-pay | Admitting: Internal Medicine

## 2020-07-01 DIAGNOSIS — K219 Gastro-esophageal reflux disease without esophagitis: Secondary | ICD-10-CM

## 2020-07-08 ENCOUNTER — Ambulatory Visit (HOSPITAL_COMMUNITY): Payer: Medicare HMO

## 2020-07-08 ENCOUNTER — Encounter (HOSPITAL_COMMUNITY): Payer: Self-pay

## 2020-07-11 IMAGING — MG DIGITAL DIAGNOSTIC BILATERAL MAMMOGRAM WITH TOMO AND CAD
6 of 10 series · 6 of 30 positions shown · non-contrast
Comparison: Previous exam(s).

CLINICAL DATA: Follow-up for probably benign findings in the left
breast felt to be related to a prior surgery.

EXAM:
DIGITAL DIAGNOSTIC BILATERAL MAMMOGRAM WITH CAD AND TOMO

[L MLO synth-2D (1 of 2)]
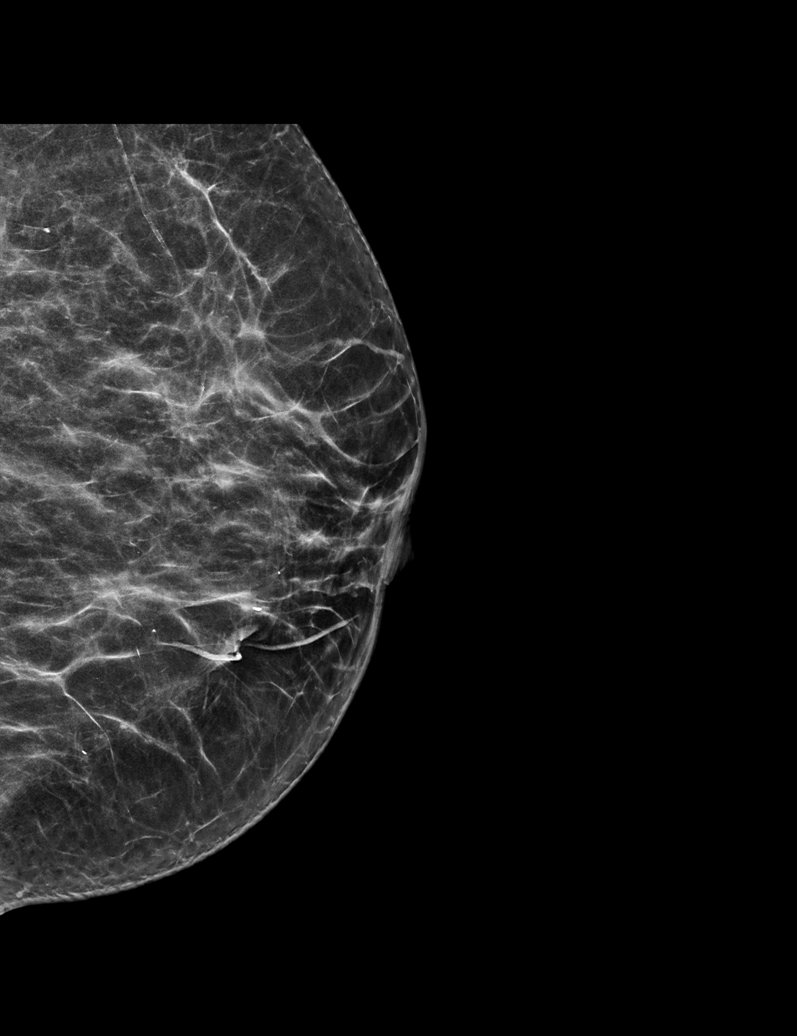

[L MLO synth-2D (2 of 2)]
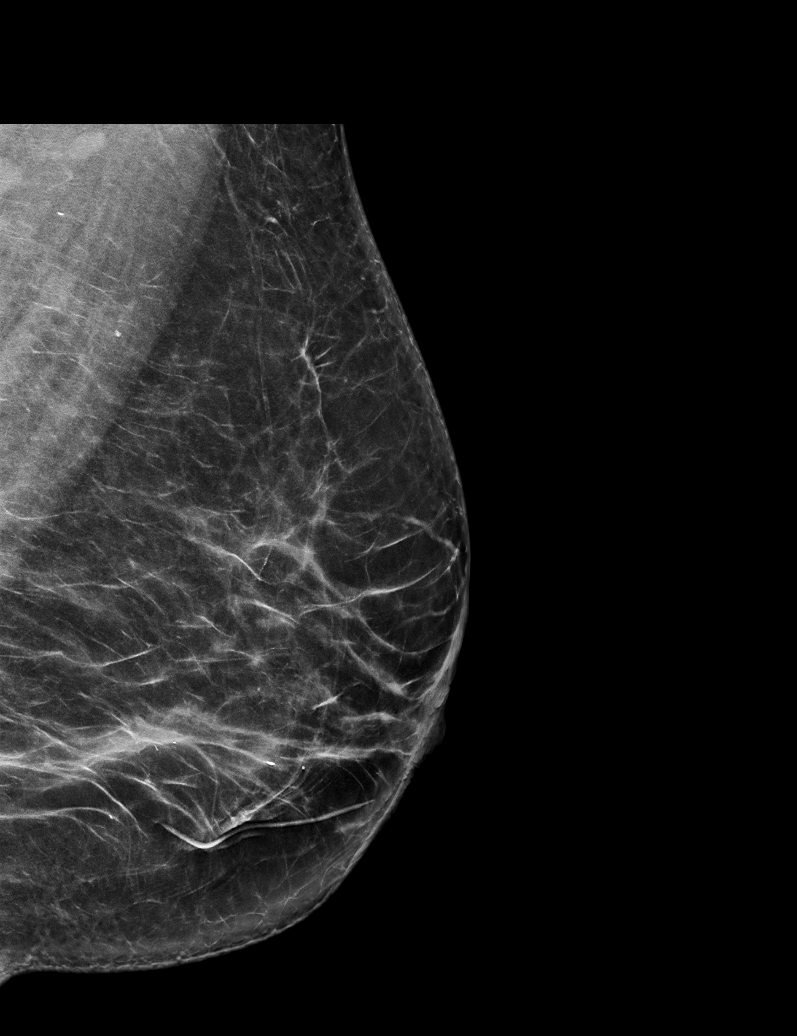

[R CC synth-2D]
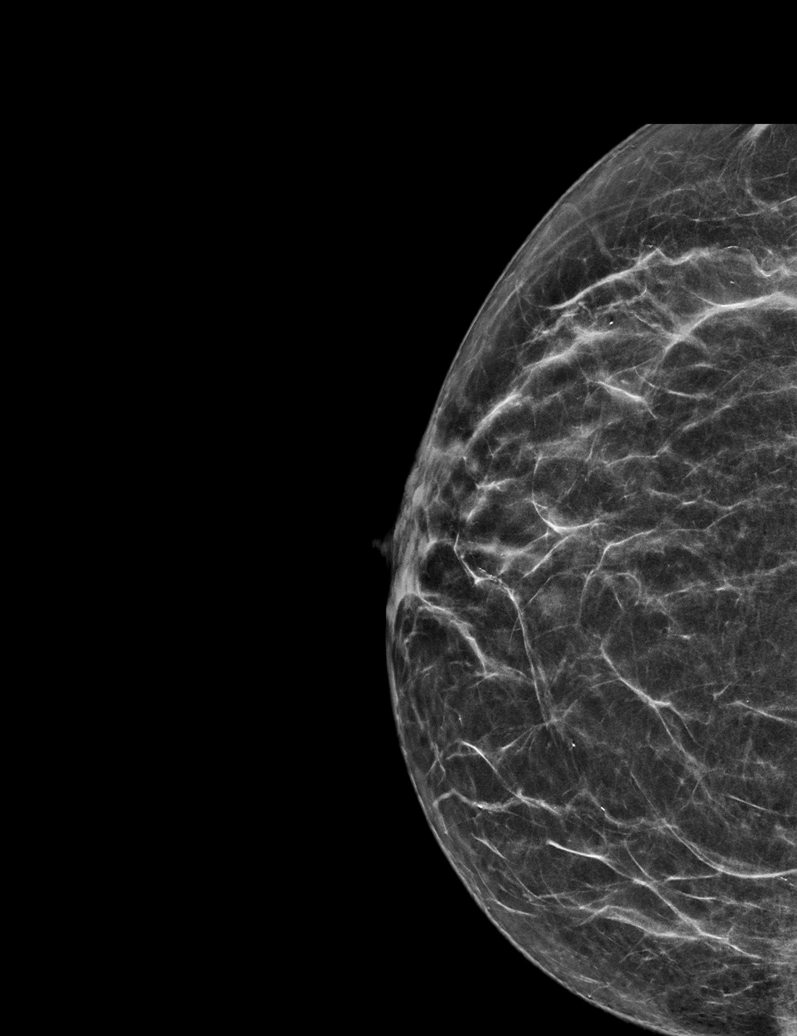

[R MLO synth-2D]
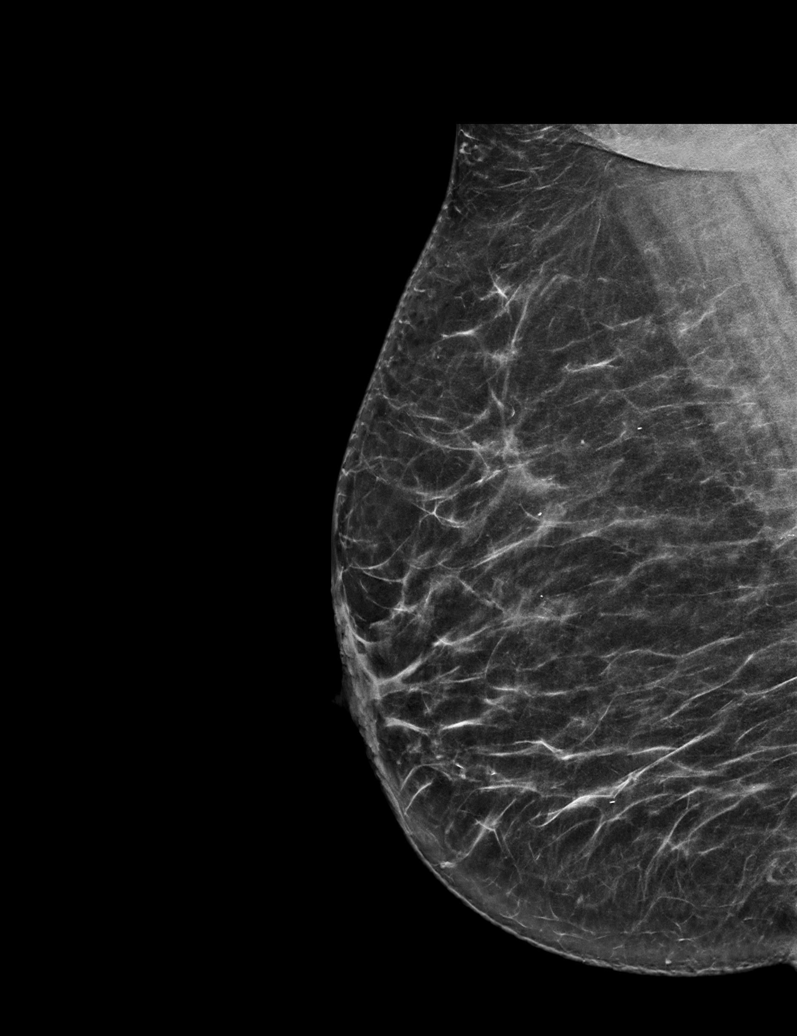

[L CC synth-2D]
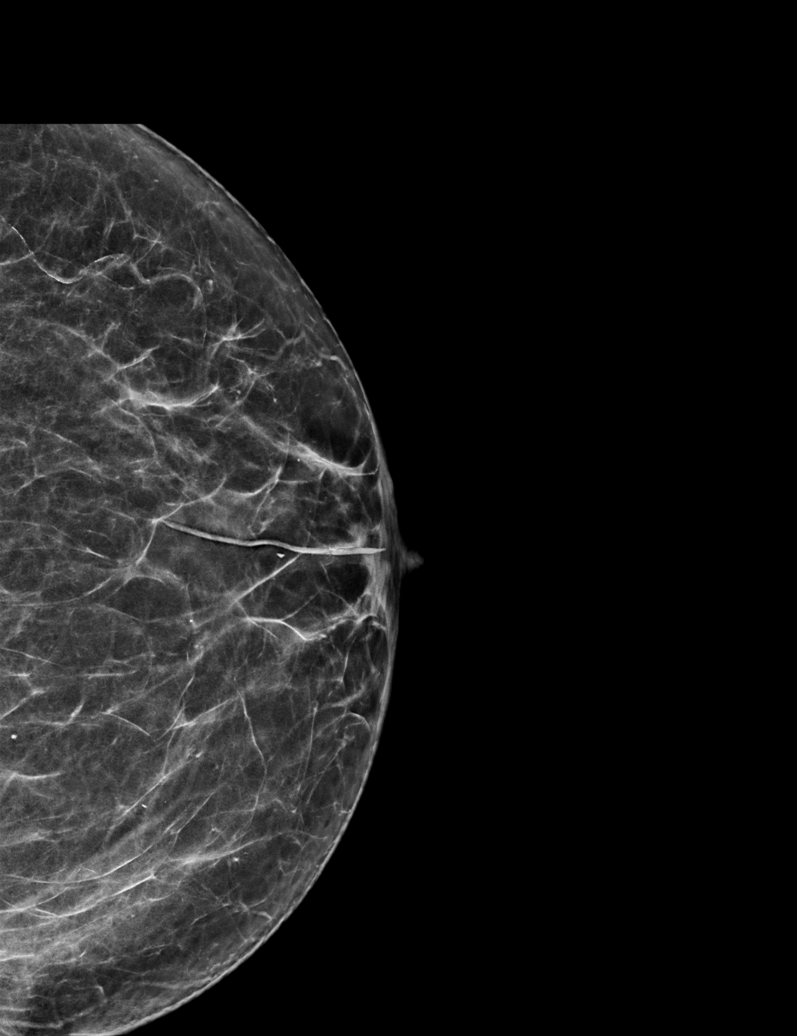

[R CC tomo · tomo slice 29/57.0]
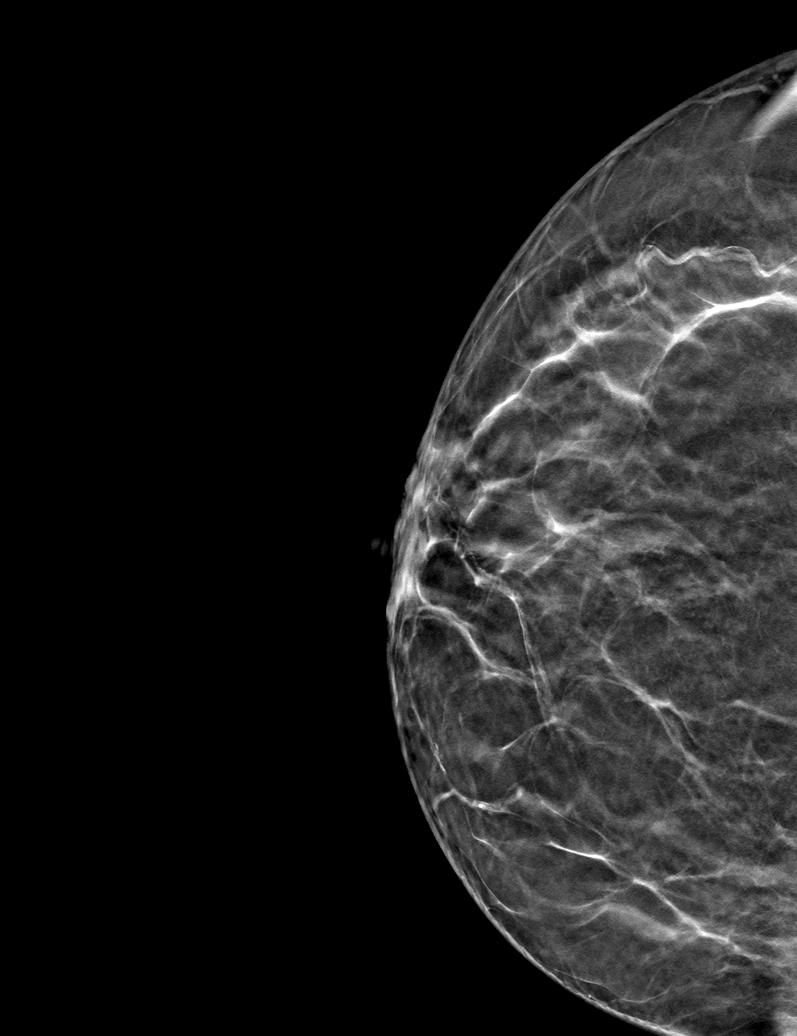

[6 of 30 positions shown; findings below may reference images not displayed]

ACR Breast Density Category b: There are scattered areas of
fibroglandular density.
FINDINGS: No suspicious masses or calcifications are seen in either breast.
Postsurgical distortion again identified in the lower central to
slightly outer left breast. There is no mammographic evidence of
malignancy in either breast.

Mammographic images were processed with CAD.

Physical examination of the left breast reveals a longitudinally
oriented scar measuring approximately 3-4 cm at the 5 to 6 o'clock
position of the left breast. This accounts for mammography findings.
IMPRESSION: No findings of malignancy in either breast.

RECOMMENDATION:
Screening mammogram in one year.(Code:OM-7-7DZ)

I have discussed the findings and recommendations with the patient.
Results were also provided in writing at the conclusion of the
visit. If applicable, a reminder letter will be sent to the patient
regarding the next appointment.

BI-RADS CATEGORY  2: Benign.

## 2020-07-15 DIAGNOSIS — Z1212 Encounter for screening for malignant neoplasm of rectum: Secondary | ICD-10-CM | POA: Diagnosis not present

## 2020-07-17 DIAGNOSIS — E119 Type 2 diabetes mellitus without complications: Secondary | ICD-10-CM | POA: Diagnosis not present

## 2020-07-21 DIAGNOSIS — Z0100 Encounter for examination of eyes and vision without abnormal findings: Secondary | ICD-10-CM | POA: Diagnosis not present

## 2020-07-28 DIAGNOSIS — E1151 Type 2 diabetes mellitus with diabetic peripheral angiopathy without gangrene: Secondary | ICD-10-CM | POA: Diagnosis not present

## 2020-08-12 DIAGNOSIS — N1831 Chronic kidney disease, stage 3a: Secondary | ICD-10-CM | POA: Diagnosis not present

## 2020-08-12 DIAGNOSIS — Z794 Long term (current) use of insulin: Secondary | ICD-10-CM | POA: Diagnosis not present

## 2020-08-12 DIAGNOSIS — E1151 Type 2 diabetes mellitus with diabetic peripheral angiopathy without gangrene: Secondary | ICD-10-CM | POA: Diagnosis not present

## 2020-08-12 DIAGNOSIS — I129 Hypertensive chronic kidney disease with stage 1 through stage 4 chronic kidney disease, or unspecified chronic kidney disease: Secondary | ICD-10-CM | POA: Diagnosis not present

## 2020-09-09 DIAGNOSIS — E1151 Type 2 diabetes mellitus with diabetic peripheral angiopathy without gangrene: Secondary | ICD-10-CM | POA: Diagnosis not present

## 2020-09-30 ENCOUNTER — Ambulatory Visit: Payer: Medicare HMO | Admitting: Podiatry

## 2020-09-30 ENCOUNTER — Ambulatory Visit (INDEPENDENT_AMBULATORY_CARE_PROVIDER_SITE_OTHER): Payer: Medicare HMO

## 2020-09-30 ENCOUNTER — Other Ambulatory Visit: Payer: Self-pay

## 2020-09-30 DIAGNOSIS — E1169 Type 2 diabetes mellitus with other specified complication: Secondary | ICD-10-CM

## 2020-09-30 DIAGNOSIS — R234 Changes in skin texture: Secondary | ICD-10-CM

## 2020-09-30 DIAGNOSIS — E1142 Type 2 diabetes mellitus with diabetic polyneuropathy: Secondary | ICD-10-CM | POA: Diagnosis not present

## 2020-09-30 DIAGNOSIS — B351 Tinea unguium: Secondary | ICD-10-CM

## 2020-09-30 DIAGNOSIS — M2042 Other hammer toe(s) (acquired), left foot: Secondary | ICD-10-CM | POA: Diagnosis not present

## 2020-09-30 NOTE — Progress Notes (Signed)
  Subjective:  Patient ID: Catherine Ramsey, female    DOB: Oct 22, 1945,  MRN: [id number]  Chief Complaint  Patient presents with  . rountine foot care    DFC -A1C 7- FBS 145 -PCP - Catherine Ramsey -2 months   . Pain    Pt states her pain is 10/10. Pt lt foot stays swollen , pt has dry/crack skin    75 y.o. female presents with the above complaint. History confirmed with patient.   Objective:  Physical Exam: warm, good capillary refill, nail exam onychomycosis of the toenails, no trophic changes or ulcerative lesions. DP pulses palpable, PT pulses palpable and protective sensation absent Left Foot: normal exam, no swelling, tenderness, instability; ligaments intact, full range of motion of all ankle/foot joints left lateral heel deep skin fissure with pain palpation Right Foot: normal exam, no swelling, tenderness, instability; ligaments intact, full range of motion of all ankle/foot joints   No images are attached to the encounter.  Assessment:   1. Hammertoe of left foot   2. Onychomycosis of multiple toenails with type 2 diabetes mellitus and peripheral neuropathy ([place])   3. Skin fissure    Plan:  Patient was evaluated and treated and all questions answered.  Onychomycosis and DPN -Patient is diabetic with a qualifying condition for at risk foot care.  Procedure: Nail Debridement Type of Debridement: manual, sharp debridement. Instrumentation: Nail nipper, rotary burr. Number of Nails: 10  Skin fissure -X-rays without any osseous prominence -Antibiotic ointment and Band-Aid applied today.  We did discuss use of mollies to prevent recurrence.  No debridement of the lesion indicated today  No follow-ups on file.

## 2020-10-02 ENCOUNTER — Other Ambulatory Visit: Payer: Self-pay | Admitting: Podiatry

## 2020-10-02 DIAGNOSIS — M2042 Other hammer toe(s) (acquired), left foot: Secondary | ICD-10-CM

## 2020-10-02 DIAGNOSIS — R234 Changes in skin texture: Secondary | ICD-10-CM

## 2020-10-09 DIAGNOSIS — E1151 Type 2 diabetes mellitus with diabetic peripheral angiopathy without gangrene: Secondary | ICD-10-CM | POA: Diagnosis not present

## 2020-10-28 DIAGNOSIS — E785 Hyperlipidemia, unspecified: Secondary | ICD-10-CM | POA: Diagnosis not present

## 2020-10-28 DIAGNOSIS — M1612 Unilateral primary osteoarthritis, left hip: Secondary | ICD-10-CM | POA: Diagnosis not present

## 2020-10-28 DIAGNOSIS — E1151 Type 2 diabetes mellitus with diabetic peripheral angiopathy without gangrene: Secondary | ICD-10-CM | POA: Diagnosis not present

## 2020-10-28 DIAGNOSIS — N1831 Chronic kidney disease, stage 3a: Secondary | ICD-10-CM | POA: Diagnosis not present

## 2020-10-28 DIAGNOSIS — Z794 Long term (current) use of insulin: Secondary | ICD-10-CM | POA: Diagnosis not present

## 2020-10-28 DIAGNOSIS — I739 Peripheral vascular disease, unspecified: Secondary | ICD-10-CM | POA: Diagnosis not present

## 2020-10-28 DIAGNOSIS — I10 Essential (primary) hypertension: Secondary | ICD-10-CM | POA: Diagnosis not present

## 2020-11-18 DIAGNOSIS — E1151 Type 2 diabetes mellitus with diabetic peripheral angiopathy without gangrene: Secondary | ICD-10-CM | POA: Diagnosis not present

## 2020-11-18 DIAGNOSIS — N1831 Chronic kidney disease, stage 3a: Secondary | ICD-10-CM | POA: Diagnosis not present

## 2020-11-18 DIAGNOSIS — U071 COVID-19: Secondary | ICD-10-CM | POA: Diagnosis not present

## 2020-11-18 DIAGNOSIS — R52 Pain, unspecified: Secondary | ICD-10-CM | POA: Diagnosis not present

## 2020-11-18 DIAGNOSIS — R059 Cough, unspecified: Secondary | ICD-10-CM | POA: Diagnosis not present

## 2020-11-18 DIAGNOSIS — Z1152 Encounter for screening for COVID-19: Secondary | ICD-10-CM | POA: Diagnosis not present

## 2020-12-12 DIAGNOSIS — S46811A Strain of other muscles, fascia and tendons at shoulder and upper arm level, right arm, initial encounter: Secondary | ICD-10-CM | POA: Diagnosis not present

## 2020-12-12 DIAGNOSIS — M542 Cervicalgia: Secondary | ICD-10-CM | POA: Diagnosis not present

## 2020-12-22 ENCOUNTER — Telehealth: Payer: Self-pay | Admitting: *Deleted

## 2020-12-22 NOTE — Telephone Encounter (Signed)
Patient is calling because she is having stabbing  B/ l feet pain, unable to sleep at night,unable to stand or walking. She is requesting an appointment today if possible. Last office visit 09/30/20.

## 2020-12-22 NOTE — Telephone Encounter (Signed)
Thank you :)

## 2020-12-23 ENCOUNTER — Other Ambulatory Visit: Payer: Self-pay

## 2020-12-23 ENCOUNTER — Ambulatory Visit: Payer: Medicare HMO | Admitting: Podiatry

## 2020-12-23 DIAGNOSIS — B351 Tinea unguium: Secondary | ICD-10-CM | POA: Diagnosis not present

## 2020-12-23 DIAGNOSIS — E1169 Type 2 diabetes mellitus with other specified complication: Secondary | ICD-10-CM | POA: Diagnosis not present

## 2020-12-23 DIAGNOSIS — L84 Corns and callosities: Secondary | ICD-10-CM

## 2020-12-23 DIAGNOSIS — E1142 Type 2 diabetes mellitus with diabetic polyneuropathy: Secondary | ICD-10-CM

## 2020-12-23 NOTE — Progress Notes (Signed)
  Subjective:  Patient ID: Catherine Ramsey, female    DOB: Oct 28, 1945,  MRN: [id number]  Chief Complaint  Patient presents with  . Foot Swelling    Follow up left foot edema and pain. Pt states edema has not improved nor has the pain.   . Nail Problem    RFC nail trim. Pt requested nail be filed.    75 y.o. female presents with the above complaint. History confirmed with patient.   Objective:  Physical Exam: warm, good capillary refill, nail exam onychomycosis of the toenails, no trophic changes or ulcerative lesions. DP pulses palpable, PT pulses palpable and protective sensation absent. Lesser digital contractures bilat, HPK left sub hallux and 3rd toe  No images are attached to the encounter.  Assessment:   1. Onychomycosis of multiple toenails with type 2 diabetes mellitus and peripheral neuropathy ([place])   2. Diabetic peripheral neuropathy associated with type 2 diabetes mellitus ([place])   3. Callus    Plan:  Patient was evaluated and treated and all questions answered.  Onychomycosis and DPN -Patient is diabetic with a qualifying condition for at risk foot care.  Procedure: Nail Debridement Type of Debridement: manual, sharp debridement. Instrumentation: Nail nipper, rotary burr. Number of Nails: 10  Procedure: Paring of Lesion Rationale: painful hyperkeratotic lesion Type of Debridement: manual, sharp debridement. Instrumentation: 312 blade Number of Lesions: 2  Return in about 3 months (around 03/25/2021) for Diabetic Foot Care.

## 2020-12-31 ENCOUNTER — Other Ambulatory Visit (HOSPITAL_COMMUNITY): Payer: Self-pay | Admitting: Medical

## 2020-12-31 ENCOUNTER — Other Ambulatory Visit: Payer: Self-pay

## 2020-12-31 ENCOUNTER — Emergency Department (HOSPITAL_BASED_OUTPATIENT_CLINIC_OR_DEPARTMENT_OTHER)
Admit: 2020-12-31 | Discharge: 2020-12-31 | Disposition: A | Discharge status: Home / Self Care | Payer: Medicare HMO | Attending: Emergency Medicine | Admitting: Emergency Medicine

## 2020-12-31 ENCOUNTER — Encounter (HOSPITAL_COMMUNITY): Payer: Self-pay | Admitting: Emergency Medicine

## 2020-12-31 ENCOUNTER — Emergency Department (HOSPITAL_COMMUNITY)
Admission: EM | Admit: 2020-12-31 | Discharge: 2020-12-31 | Disposition: A | Discharge status: Home / Self Care | Payer: Medicare HMO | Attending: Emergency Medicine | Admitting: Emergency Medicine

## 2020-12-31 DIAGNOSIS — M79672 Pain in left foot: Secondary | ICD-10-CM | POA: Diagnosis not present

## 2020-12-31 DIAGNOSIS — M79662 Pain in left lower leg: Secondary | ICD-10-CM | POA: Insufficient documentation

## 2020-12-31 DIAGNOSIS — R609 Edema, unspecified: Secondary | ICD-10-CM | POA: Diagnosis not present

## 2020-12-31 DIAGNOSIS — I10 Essential (primary) hypertension: Secondary | ICD-10-CM | POA: Diagnosis not present

## 2020-12-31 DIAGNOSIS — E119 Type 2 diabetes mellitus without complications: Secondary | ICD-10-CM | POA: Diagnosis not present

## 2020-12-31 DIAGNOSIS — M25572 Pain in left ankle and joints of left foot: Secondary | ICD-10-CM | POA: Diagnosis not present

## 2020-12-31 DIAGNOSIS — M7122 Synovial cyst of popliteal space [Baker], left knee: Secondary | ICD-10-CM | POA: Diagnosis not present

## 2020-12-31 DIAGNOSIS — Z96641 Presence of right artificial hip joint: Secondary | ICD-10-CM | POA: Diagnosis not present

## 2020-12-31 DIAGNOSIS — Z79899 Other long term (current) drug therapy: Secondary | ICD-10-CM | POA: Insufficient documentation

## 2020-12-31 DIAGNOSIS — M79605 Pain in left leg: Secondary | ICD-10-CM | POA: Diagnosis not present

## 2020-12-31 DIAGNOSIS — M79661 Pain in right lower leg: Secondary | ICD-10-CM | POA: Insufficient documentation

## 2020-12-31 DIAGNOSIS — Z794 Long term (current) use of insulin: Secondary | ICD-10-CM | POA: Insufficient documentation

## 2020-12-31 DIAGNOSIS — M25562 Pain in left knee: Secondary | ICD-10-CM | POA: Diagnosis not present

## 2020-12-31 DIAGNOSIS — Z8616 Personal history of COVID-19: Secondary | ICD-10-CM | POA: Diagnosis not present

## 2020-12-31 DIAGNOSIS — M79604 Pain in right leg: Secondary | ICD-10-CM

## 2020-12-31 LAB — BASIC METABOLIC PANEL
Anion gap: 12 (ref 5–15)
BUN: 24 mg/dL — ABNORMAL HIGH (ref 8–23)
CO2: 25 mmol/L (ref 22–32)
Calcium: 9.3 mg/dL (ref 8.9–10.3)
Chloride: 99 mmol/L (ref 98–111)
Creatinine, Ser: 1.3 mg/dL — ABNORMAL HIGH (ref 0.44–1.00)
GFR, Estimated: 43 mL/min — ABNORMAL LOW (ref >60)
Glucose, Bld: 194 mg/dL — ABNORMAL HIGH (ref 70–99)
Potassium: 3.8 mmol/L (ref 3.5–5.1)
Sodium: 136 mmol/L (ref 135–145)

## 2020-12-31 LAB — CBC WITH DIFFERENTIAL/PLATELET
Abs Immature Granulocytes: 0.05 10*3/uL (ref 0.00–0.07)
Basophils Absolute: 0.1 10*3/uL (ref 0.0–0.1)
Basophils Relative: 0 %
Eosinophils Absolute: 0.3 10*3/uL (ref 0.0–0.5)
Eosinophils Relative: 3 %
HCT: 35.1 % — ABNORMAL LOW (ref 36.0–46.0)
Hemoglobin: 10.9 g/dL — ABNORMAL LOW (ref 12.0–15.0)
Immature Granulocytes: 0 %
Lymphocytes Relative: 23 %
Lymphs Abs: 2.6 10*3/uL (ref 0.7–4.0)
MCH: 27.4 pg (ref 26.0–34.0)
MCHC: 31.1 g/dL (ref 30.0–36.0)
MCV: 88.2 fL (ref 80.0–100.0)
Monocytes Absolute: 0.9 10*3/uL (ref 0.1–1.0)
Monocytes Relative: 8 %
Neutro Abs: 7.5 10*3/uL (ref 1.7–7.7)
Neutrophils Relative %: 66 %
Platelets: 292 10*3/uL (ref 150–400)
RBC: 3.98 MIL/uL (ref 3.87–5.11)
RDW: 13 % (ref 11.5–15.5)
WBC: 11.4 10*3/uL — ABNORMAL HIGH (ref 4.0–10.5)
nRBC: 0 % (ref 0.0–0.2)

## 2020-12-31 NOTE — Progress Notes (Signed)
Left lower extremity venous duplex has been completed. Preliminary results can be found in CV Proc through chart review.  Results were given to [facility] [place] PA.  12/31/20 4:45 PM Carlos Levering RVT

## 2020-12-31 NOTE — ED Provider Notes (Signed)
[facility] DEPT Provider Note   CSN: [id number] Arrival date & time: 12/31/20  1512     History Chief Complaint  Patient presents with  . Leg Pain    left    Catherine Ramsey is a 75 y.o. female.  She is here with a complaint of pain in her legs that has been going on for over 2 months.  Usually left leg more than right.  Worse at night when she is trying to sleep and she feels some cramping.  Was seen at orthopedic clinic and they sent her over here to get an ultrasound for blood clot.  She denies any shortness of breath or chest pain.  She does have a follow-up appointment with them.  No numbness or weakness.  Sometimes they swell a little bit.  No wounds or trauma.  The history is provided by the patient.  Leg Pain Location:  Leg Leg location:  L lower leg and R lower leg Pain details:    Quality:  Cramping and aching   Severity:  Moderate   Onset quality:  Gradual   Timing:  Intermittent   Progression:  Unchanged Chronicity:  New Dislocation: no   Foreign body present:  No foreign bodies Relieved by:  Nothing Exacerbated by: laying in bed at night. Ineffective treatments: mustard. Associated symptoms: swelling   Associated symptoms: no fever and no neck pain        Past Medical History:  Diagnosis Date  . Achalasia - Type II 05/08/2010   Qualifier: Diagnosis of  By: Carlean Purl MD, Dimas Millin Acute kidney injury ([place]) 02/26/2019  . Candida esophagitis ([place]) 03/26/2014  . COVID-19 virus detected 02/25/2019  . Diabetes mellitus    type 2  . Family history of adverse reaction to anesthesia    son hard to wake up  . Hypertension   . Syncope 02/25/2019  . Trigger ring finger of right hand 10/07/2016    Patient Active Problem List   Diagnosis Date Noted  . Hypercholesterolemia 06/22/2019  . Diabetes mellitus type 2 in obese ([place]) 06/22/2019  . Diabetes mellitus due to underlying condition, uncontrolled, with hyperglycemia ([place])   .  Essential hypertension 02/25/2019  . Insulin dependent diabetes mellitus 05/08/2010  . Achalasia - Type II 05/08/2010  . Personal history of colonic polyps - adenomas, TV adenomas, serrated polyps 05/08/2010    Past Surgical History:  Procedure Laterality Date  . ABDOMINAL HYSTERECTOMY    . BREAST BIOPSY     left/benign  . COLONOSCOPY  01/31/2014  . ESOPHAGEAL MANOMETRY N/A 04/01/2014   Procedure: ESOPHAGEAL MANOMETRY (EM);  Surgeon: Gatha Mayer, MD;  Location: [facility];  Service: Endoscopy;  Laterality: N/A;  . HELLER MYOTOMY N/A 12/07/2016   Procedure: LAPAROSCOPIC HELLER MYOTOMY, DOR FUNDOPLICATION, HIATAL HERNIA REPAIR, INTRAOPERATIVE UPPER ENDOSCOPY;  Surgeon: Jackolyn Confer, MD;  Location: [facility] ORS;  Service: General;  Laterality: N/A;  . TOTAL HIP ARTHROPLASTY     right pin then replacement     OB History   No obstetric history on file.     Family History  Problem Relation Age of Onset  . Diabetes Mother        died at 57 from Covid-89  . Hypertension Mother   . Hyperlipidemia Mother   . Breast cancer Mother   . Diabetes Father   . Hypertension Father   . Hypertension Sister   . Hypertension Sister   . Colon polyps Daughter   .  Colon cancer Neg Hx   . Esophageal cancer Neg Hx   . Rectal cancer Neg Hx   . Stomach cancer Neg Hx     Social History   Tobacco Use  . Smoking status: Never Smoker  . Smokeless tobacco: Never Used  Vaping Use  . Vaping Use: Never used  Substance Use Topics  . Alcohol use: No  . Drug use: No    Home Medications Prior to Admission medications   Medication Sig Start Date End Date Taking? Authorizing Provider  acetaminophen (TYLENOL) 325 MG tablet Take 2 tablets (650 mg total) by mouth every 6 (six) hours as needed for mild pain (or Fever >/= 101). 03/01/19   Nita Sells, MD  Alcohol Swabs (B-D SINGLE USE SWABS REGULAR) PADS  12/10/19   [provider]  amLODipine (NORVASC) 10 MG tablet Take 10 mg by mouth  daily.    [provider]  Blood Glucose Monitoring Suppl (TRUE METRIX AIR GLUCOSE METER) w/Device KIT  11/30/19   [provider]  CINNAMON PO Take by mouth.    [provider]  gabapentin (NEURONTIN) 300 MG capsule Take 300 mg by mouth daily as needed (pain).    [provider]  insulin aspart (NOVOLOG FLEXPEN) 100 UNIT/ML FlexPen Inject 8 Units into the skin 3 (three) times daily with meals. Sliding scale    [provider]  KLOR-CON M20 20 MEQ tablet Take 20 mEq by mouth daily. 09/06/17   [provider]  LANTUS SOLOSTAR 100 UNIT/ML Solostar Pen  10/24/19   [provider]  losartan-hydrochlorothiazide (HYZAAR) 100-25 MG tablet Take 1 tablet by mouth daily.    [provider]  omeprazole (PRILOSEC) 20 MG capsule  09/10/20   [provider]  pravastatin (PRAVACHOL) 40 MG tablet Take 40 mg by mouth at bedtime. 12/02/18   [provider]  TRUE METRIX BLOOD GLUCOSE TEST test strip  11/30/19   [provider]  TRUEplus Lancets 33G [place]  11/30/19   [provider]    Allergies    Patient has no known allergies.  Review of Systems   Review of Systems  Constitutional: Negative for fever.  HENT: Negative for sore throat.   Respiratory: Negative for shortness of breath.   Cardiovascular: Positive for leg swelling. Negative for chest pain.  Gastrointestinal: Negative for abdominal pain.  Genitourinary: Negative for dysuria.  Musculoskeletal: Negative for neck pain.  Skin: Negative for rash.  Neurological: Negative for headaches.    Physical Exam Updated Vital Signs BP 139/70 (BP Location: Right Arm)   Pulse 81   Temp 98.9 F (37.2 C) (Oral)   Resp 18   Ht '5\' 5"'  (1.651 m)   Wt 84.4 kg   SpO2 98%   BMI 30.95 kg/m   Physical Exam Vitals and nursing note reviewed.  Constitutional:      General: She is not in acute distress.    Appearance: Normal appearance. She is well-developed.   HENT:     Head: Normocephalic and atraumatic.  Eyes:     Conjunctiva/sclera: Conjunctivae normal.  Cardiovascular:     Rate and Rhythm: Normal rate and regular rhythm.     Heart sounds: No murmur heard.   Pulmonary:     Effort: Pulmonary effort is normal. No respiratory distress.     Breath sounds: Normal breath sounds.  Abdominal:     Palpations: Abdomen is soft.     Tenderness: There is no abdominal tenderness.  Musculoskeletal:  General: No deformity or signs of injury. Normal range of motion.     Cervical back: Neck supple.     Comments: Lower extremity she has good distal pulses.  No significant edema.  No warmth or erythema.  Full range of motion at hip knee and ankle on both sides.  No cords appreciated  Skin:    General: Skin is warm and dry.  Neurological:     General: No focal deficit present.     Mental Status: She is alert.     ED Results / Procedures / Treatments   Labs (all labs ordered are listed, but only abnormal results are displayed) Labs Reviewed  BASIC METABOLIC PANEL - Abnormal; Notable for the following components:      Result Value   Glucose, Bld 194 (*)    BUN 24 (*)    Creatinine, Ser 1.30 (*)    GFR, Estimated 43 (*)    All other components within normal limits  CBC WITH DIFFERENTIAL/PLATELET - Abnormal; Notable for the following components:   WBC 11.4 (*)    Hemoglobin 10.9 (*)    HCT 35.1 (*)    All other components within normal limits    EKG None  Radiology VAS [place] LOWER EXTREMITY VENOUS (DVT) (ONLY [facility] & [facility] 7a-7p)  Result Date: 12/31/2020  Lower Venous DVT Study Patient Name:  Catherine Ramsey  Date of Exam:   12/31/2020 Medical Rec #: [id number]        Accession #:    [id number] Date of Birth: 1945-11-11        Patient Gender: F Patient Age:   75Y Exam Location:  [facility] Procedure:      VAS [place] LOWER EXTREMITY VENOUS (DVT) Referring Phys: [id number] HINA KHATRI  --------------------------------------------------------------------------------  Indications: Edema.  Risk Factors: None identified. Limitations: Poor ultrasound/tissue interface. Comparison Study: No prior studies. Performing Technologist: Oliver Hum RVT  Examination Guidelines: A complete evaluation includes B-mode imaging, spectral Doppler, color Doppler, and power Doppler as needed of all accessible portions of each vessel. Bilateral testing is considered an integral part of a complete examination. Limited examinations for reoccurring indications may be performed as noted. The reflux portion of the exam is performed with the patient in reverse Trendelenburg.  +-----+---------------+---------+-----------+----------+--------------+ RIGHTCompressibilityPhasicitySpontaneityPropertiesThrombus Aging +-----+---------------+---------+-----------+----------+--------------+ CFV  Full           Yes      Yes                                 +-----+---------------+---------+-----------+----------+--------------+   +---------+---------------+---------+-----------+----------+--------------+ LEFT     CompressibilityPhasicitySpontaneityPropertiesThrombus Aging +---------+---------------+---------+-----------+----------+--------------+ CFV      Full           Yes      Yes                                 +---------+---------------+---------+-----------+----------+--------------+ SFJ      Full                                                        +---------+---------------+---------+-----------+----------+--------------+ FV Prox  Full                                                        +---------+---------------+---------+-----------+----------+--------------+  FV Mid   Full                                                        +---------+---------------+---------+-----------+----------+--------------+ FV DistalFull                                                         +---------+---------------+---------+-----------+----------+--------------+ PFV      Full                                                        +---------+---------------+---------+-----------+----------+--------------+ POP      Full           Yes      Yes                                 +---------+---------------+---------+-----------+----------+--------------+ PTV      Full                                                        +---------+---------------+---------+-----------+----------+--------------+ PERO     Full                                                        +---------+---------------+---------+-----------+----------+--------------+    Summary: RIGHT: - No evidence of common femoral vein obstruction.  LEFT: - There is no evidence of deep vein thrombosis in the lower extremity.  - A cystic structure of mixed echogenicity, measuring 2.1 cm high by 2.9 cm wide by greater than 4.6 cm long, is found in the popliteal fossa.  *See table(s) above for measurements and observations.    Preliminary     Procedures Procedures   Medications Ordered in ED Medications - No data to display  ED Course  I have reviewed the triage vital signs and the nursing notes.  Pertinent labs & imaging results that were available during my care of the patient were reviewed by me and considered in my medical decision making (see chart for details).    MDM Rules/Calculators/A&P                         75 year old female with atraumatic cramping and pain in her legs left greater than right.  She does have some mild edema in the left side.  No cords appreciated.  Ultrasound does not show any evidence of DVT.  She does have likely Baker's cyst on the left.  Labs showing mild elevation of white count, hemoglobin low but stable from priors.  Chemistries with some element of CKD.  Recommended close follow-up with PCP  for further evaluation.   Final Clinical Impression(s) / ED  Diagnoses Final diagnoses:  Right leg pain  Left leg pain  Baker cyst, left    Rx / DC Orders ED Discharge Orders    None       Hayden Rasmussen, MD 01/01/21 1012

## 2020-12-31 NOTE — Discharge Instructions (Addendum)
You are seen in the emergency department for 2 months of intermittent leg pain.  You had an ultrasound that did not show any evidence of a deep vein thrombosis or blood clot.  You do have a Baker's cyst behind your left knee.  This may be causing some of your symptoms.  Recommend that you follow back up with orthopedics.  Your lab work showed you to be mildly anemic.  This will also need follow-up with your primary care doctor.

## 2020-12-31 NOTE — ED Provider Notes (Signed)
Emergency Medicine Provider Triage Evaluation Note  Catherine Ramsey , a 75 y.o. female  was evaluated in triage.  Pt complains of left leg pain and swelling.  Symptoms have been progressively worsening over the past 2 months.  Was sent to the ER for an ultrasound to rule out a DVT.  States that pain is throbbing and worse when she lays down at night.  No injury or trauma, no chest pain or shortness of breath.  Review of Systems  Positive: Left leg pain and swelling Negative: Chest pain, shortness of breath  Physical Exam  BP 139/70 (BP Location: Right Arm)   Pulse 81   Temp 98.9 F (37.2 C) (Oral)   Resp 18   SpO2 98%  Gen:   Awake, no distress   Resp:  Normal effort  MSK:   Moves extremities without difficulty  Other:  L calf tenderness  Medical Decision Making  Medically screening exam initiated at 3:57 PM.  Appropriate orders placed.  Catherine Ramsey was informed that the remainder of the evaluation will be completed by another provider, this initial triage assessment does not replace that evaluation, and the importance of remaining in the ED until their evaluation is complete.  Will order DVT study.     Delia Heady, PA-C 12/31/20 1638    Hayden Rasmussen, MD 01/02/21 [phone]

## 2020-12-31 NOTE — ED Triage Notes (Signed)
Pt arriving with left leg pain for approx 2 months. Concern for DVT. Pt reports hx of bones spurs in this leg.

## 2021-01-01 ENCOUNTER — Ambulatory Visit (HOSPITAL_COMMUNITY): Admission: RE | Admit: 2021-01-01 | Payer: Medicare HMO | Source: Ambulatory Visit

## 2021-01-06 DIAGNOSIS — M25469 Effusion, unspecified knee: Secondary | ICD-10-CM | POA: Diagnosis not present

## 2021-01-06 DIAGNOSIS — M25562 Pain in left knee: Secondary | ICD-10-CM | POA: Diagnosis not present

## 2021-01-14 DIAGNOSIS — E1151 Type 2 diabetes mellitus with diabetic peripheral angiopathy without gangrene: Secondary | ICD-10-CM | POA: Diagnosis not present

## 2021-03-24 ENCOUNTER — Other Ambulatory Visit: Payer: Self-pay

## 2021-03-24 ENCOUNTER — Ambulatory Visit: Payer: Medicare HMO | Admitting: Podiatry

## 2021-03-24 DIAGNOSIS — E1142 Type 2 diabetes mellitus with diabetic polyneuropathy: Secondary | ICD-10-CM

## 2021-03-24 DIAGNOSIS — B351 Tinea unguium: Secondary | ICD-10-CM

## 2021-03-24 DIAGNOSIS — E1169 Type 2 diabetes mellitus with other specified complication: Secondary | ICD-10-CM

## 2021-03-24 NOTE — Progress Notes (Signed)
  Subjective:  Patient ID: Catherine Ramsey, female    DOB: 1946/06/18,  MRN: [id number]  Chief Complaint  Patient presents with   Nail Problem    Diabetic foot care Nail trim    75 y.o. female presents with the above complaint. History confirmed with patient.   Objective:  Physical Exam: warm, good capillary refill, nail exam onychomycosis of the toenails, no trophic changes or ulcerative lesions. DP pulses palpable, PT pulses palpable and protective sensation absent. Lesser digital contractures bilat, HPK left sub hallux and 3rd toe  No images are attached to the encounter.  Assessment:   1. Onychomycosis of multiple toenails with type 2 diabetes mellitus and peripheral neuropathy ([place])    Plan:  Patient was evaluated and treated and all questions answered.  Onychomycosis and DPN -Patient is diabetic with a qualifying condition for at risk foot care.  Procedure: Nail Debridement Type of Debridement: manual, sharp debridement. Instrumentation: Nail nipper, rotary burr. Number of Nails: 10   No follow-ups on file.

## 2021-03-27 ENCOUNTER — Ambulatory Visit: Payer: Medicare HMO | Admitting: Podiatry

## 2021-04-09 DIAGNOSIS — E1151 Type 2 diabetes mellitus with diabetic peripheral angiopathy without gangrene: Secondary | ICD-10-CM | POA: Diagnosis not present

## 2021-04-09 DIAGNOSIS — E785 Hyperlipidemia, unspecified: Secondary | ICD-10-CM | POA: Diagnosis not present

## 2021-04-09 DIAGNOSIS — I10 Essential (primary) hypertension: Secondary | ICD-10-CM | POA: Diagnosis not present

## 2021-04-09 DIAGNOSIS — Z794 Long term (current) use of insulin: Secondary | ICD-10-CM | POA: Diagnosis not present

## 2021-04-09 DIAGNOSIS — N1831 Chronic kidney disease, stage 3a: Secondary | ICD-10-CM | POA: Diagnosis not present

## 2021-04-22 ENCOUNTER — Telehealth: Payer: Self-pay | Admitting: *Deleted

## 2021-04-22 NOTE — Telephone Encounter (Signed)
Patient is having great toe pain w/ swelling of the foot and toe. She is scheduled/confirmed to be seen on 04/24/21 by Dr Paulla Dolly for reevaluation.

## 2021-04-22 NOTE — Telephone Encounter (Signed)
Returned the call to patient, gave information per Dr Jacqualyn Posey, she said that the toe is not draining as of yet and the swelling is ongoing, not something new.

## 2021-04-23 NOTE — Telephone Encounter (Signed)
Returned the call to patient and gave information per Dr Jacqualyn Posey wanting a second opinion, toe is bothering her with the swelling. She has been scheduled w/ Dr Paulla Dolly on 04/24/21.

## 2021-04-24 ENCOUNTER — Other Ambulatory Visit: Payer: Self-pay

## 2021-04-24 ENCOUNTER — Ambulatory Visit (INDEPENDENT_AMBULATORY_CARE_PROVIDER_SITE_OTHER): Payer: Medicare HMO

## 2021-04-24 ENCOUNTER — Encounter: Payer: Self-pay | Admitting: Podiatry

## 2021-04-24 ENCOUNTER — Ambulatory Visit (INDEPENDENT_AMBULATORY_CARE_PROVIDER_SITE_OTHER): Payer: Medicare HMO | Admitting: Podiatry

## 2021-04-24 DIAGNOSIS — I999 Unspecified disorder of circulatory system: Secondary | ICD-10-CM

## 2021-04-24 DIAGNOSIS — M2042 Other hammer toe(s) (acquired), left foot: Secondary | ICD-10-CM | POA: Diagnosis not present

## 2021-04-24 NOTE — Progress Notes (Signed)
Subjective:   Patient ID: Catherine Ramsey, female   DOB: 75 y.o.   MRN: [id number]   HPI Patient is concerned about pain in her left foot and up into her lower leg and states that a toenail that was fixed about a month ago has been bothersome.  States it was draining is feeling better now but she wanted to   ROS      Objective:  Physical Exam  Vascular status definitely diminished on the left side with pain in her leg and no indications currently of drainage in the left big toe with crusted tissue that appears to be healing well and I did not note any current pathology with this     Assessment:  Possibility for vascular disease left that may be creating the discomfort she is experiencing with previous nail debridement which appears to be doing fine no indications of current infection     Plan:  I am going to stent send for vascular study to rule out vascular disease and she will continue to work on her left big toe if it gives her any trouble she is to let us know and may eventually require removal of some crusted tissue formation but at this time appears stable

## 2021-04-28 ENCOUNTER — Encounter: Payer: Self-pay | Admitting: Podiatry

## 2021-04-30 ENCOUNTER — Ambulatory Visit: Payer: Medicare HMO | Admitting: Podiatrist

## 2021-05-01 ENCOUNTER — Other Ambulatory Visit: Payer: Self-pay | Admitting: Podiatry

## 2021-05-01 DIAGNOSIS — I999 Unspecified disorder of circulatory system: Secondary | ICD-10-CM

## 2021-05-05 ENCOUNTER — Other Ambulatory Visit: Payer: Self-pay

## 2021-05-05 ENCOUNTER — Ambulatory Visit (HOSPITAL_COMMUNITY)
Admission: RE | Admit: 2021-05-05 | Discharge: 2021-05-05 | Disposition: A | Discharge status: Home / Self Care | Payer: Medicare HMO | Source: Ambulatory Visit | Attending: Cardiovascular Disease | Admitting: Cardiovascular Disease

## 2021-05-05 ENCOUNTER — Ambulatory Visit: Payer: Medicare HMO | Admitting: Cardiovascular Disease

## 2021-05-05 ENCOUNTER — Encounter: Payer: Self-pay | Admitting: Cardiovascular Disease

## 2021-05-05 DIAGNOSIS — I739 Peripheral vascular disease, unspecified: Secondary | ICD-10-CM

## 2021-05-05 DIAGNOSIS — M79672 Pain in left foot: Secondary | ICD-10-CM | POA: Diagnosis not present

## 2021-05-05 DIAGNOSIS — M79671 Pain in right foot: Secondary | ICD-10-CM

## 2021-05-05 DIAGNOSIS — I999 Unspecified disorder of circulatory system: Secondary | ICD-10-CM | POA: Diagnosis not present

## 2021-05-05 NOTE — Assessment & Plan Note (Signed)
[name] was referred for PAD.  She is a patient of Dr. Victorino December.  She sees Dr. Paulla Dolly at [facility].  She did have some crusting of her left great toe but this seems to have resolved.  She does complain of symptoms compatible with diabetic peripheral neuropathy.  She denies claudication.  Dopplers performed 05/05/2021 revealed a right ABI of 0.75 and a left of 0.70.  She had 0 vessel runoff on the left principally because of tibial vessel disease typical and diabetic.  In the absence of an open wound I do not think she needs an invasive evaluation.  She denies claudication.

## 2021-05-05 NOTE — Patient Instructions (Signed)
Medication Instructions:  Your physician recommends that you continue on your current medications as directed. Please refer to the Current Medication list given to you today.  *If you need a refill on your cardiac medications before your next appointment, please call your pharmacy*   Follow-Up: At [facility], you and your health needs are our priority.  As part of our continuing mission to provide you with exceptional heart care, we have created designated Provider Care Teams.  These Care Teams include your primary Cardiologist (physician) and Advanced Practice Providers (APPs -  Physician Assistants and Nurse Practitioners) who all work together to provide you with the care you need, when you need it.  We recommend signing up for the patient portal called "MyChart".  Sign up information is provided on this After Visit Summary.  MyChart is used to connect with patients for Virtual Visits (Telemedicine).  Patients are able to view lab/test results, encounter notes, upcoming appointments, etc.  Non-urgent messages can be sent to your provider as well.   To learn more about what you can do with MyChart, go to [email or web address].    Your next appointment:   We will see you on an as needed basis. If you need to schedule an appointment please call our office.  Provider:   Quay Burow, MD

## 2021-05-05 NOTE — Progress Notes (Signed)
05/05/2021 Catherine Ramsey   01/19/1946  [id number]  Primary Physician Leanna Battles, MD Primary Cardiologist: Lorretta Harp MD Lupe Carney, [place]  HPI:  Catherine Ramsey is a 75 y.o. moderately overweight married African-American female mother of 50 living children, grandmother to 68 grandchildren who was referred by [facility] for peripheral vascular valuation because of crusting on her left great toe with question of peripheral arterial disease.  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  She does have a history of treated hypertension, diabetes and hyperlipidemia.  She is never had a heart attack or stroke.  She denies chest pain or shortness of breath.  She does complain of symptoms compatible with diabetic peripheral neuropathy.  She was seen Drs. Regal and Price and had some crusting on her left great toe which seems to have resolved.  She did have peripheral Doppler studies performed today which revealed a right ABI of 0.75 and a left of 0.70 with 0 vessel runoff below the knee on the left.   Current Meds  Medication Sig   acetaminophen (TYLENOL) 325 MG tablet Take 2 tablets (650 mg total) by mouth every 6 (six) hours as needed for mild pain (or Fever >/= 101).   Alcohol Swabs (B-D SINGLE USE SWABS REGULAR) PADS    amLODipine (NORVASC) 10 MG tablet Take 10 mg by mouth daily.   Blood Glucose Monitoring Suppl (TRUE METRIX AIR GLUCOSE METER) w/Device KIT    CINNAMON PO Take by mouth.   gabapentin (NEURONTIN) 300 MG capsule Take 300 mg by mouth daily as needed (pain).   insulin aspart (NOVOLOG) 100 UNIT/ML FlexPen Inject 8 Units into the skin 3 (three) times daily with meals. Sliding scale   KLOR-CON M20 20 MEQ tablet Take 20 mEq by mouth daily.   LANTUS SOLOSTAR 100 UNIT/ML Solostar Pen    losartan-hydrochlorothiazide (HYZAAR) 100-25 MG tablet Take 1 tablet by mouth daily.   omeprazole (PRILOSEC) 20 MG capsule    pravastatin (PRAVACHOL)  40 MG tablet Take 40 mg by mouth at bedtime.   TRUE METRIX BLOOD GLUCOSE TEST test strip    TRUEplus Lancets 33G [place]      No Known Allergies  Social History   Socioeconomic History   Marital status: Married    Spouse name: Not on file   Number of children: Not on file   Years of education: Not on file   Highest education level: Not on file  Occupational History   Not on file  Tobacco Use   Smoking status: Never   Smokeless tobacco: Never  Vaping Use   Vaping Use: Never used  Substance and Sexual Activity   Alcohol use: No   Drug use: No   Sexual activity: Yes  Other Topics Concern   Not on file  Social History Narrative   Not on file   Social Determinants of Health   Financial Resource Strain: Not on file  Food Insecurity: Not on file  Transportation Needs: Not on file  Physical Activity: Not on file  Stress: Not on file  Social Connections: Not on file  Intimate Partner Violence: Not on file     Review of Systems: General: negative for chills, fever, night sweats or weight changes.  Cardiovascular: negative for chest pain, dyspnea on exertion, edema, orthopnea, palpitations, paroxysmal nocturnal dyspnea or shortness of breath Dermatological: negative for rash Respiratory: negative for cough or wheezing Urologic: negative for hematuria Abdominal:  negative for nausea, vomiting, diarrhea, bright red blood per rectum, melena, or hematemesis Neurologic: negative for visual changes, syncope, or dizziness All other systems reviewed and are otherwise negative except as noted above.    Blood pressure 128/70, pulse 75, height '5\' 3"'  (1.6 m), weight 185 lb (83.9 kg).  General appearance: alert and no distress Neck: no adenopathy, no carotid bruit, no JVD, supple, symmetrical, trachea midline, and thyroid not enlarged, symmetric, no tenderness/mass/nodules Lungs: clear to auscultation bilaterally Heart: regular rate and rhythm, S1, S2 normal, no murmur, click, rub or  gallop Extremities: extremities normal, atraumatic, no cyanosis or edema Pulses: Absent pedal pulses Skin: Skin color, texture, turgor normal. No rashes or lesions Neurologic: Grossly normal  EKG not performed today  ASSESSMENT AND PLAN:   Peripheral arterial disease ([place]) [name] was referred for PAD.  She is a patient of Dr. Victorino December.  She sees Dr. Paulla Dolly at [facility].  She did have some crusting of her left great toe but this seems to have resolved.  She does complain of symptoms compatible with diabetic peripheral neuropathy.  She denies claudication.  Dopplers performed 05/05/2021 revealed a right ABI of 0.75 and a left of 0.70.  She had 0 vessel runoff on the left principally because of tibial vessel disease typical and diabetic.  In the absence of an open wound I do not think she needs an invasive evaluation.  She denies claudication.     Lorretta Harp MD FACP,FACC,FAHA, [facility] 05/05/2021 3:16 PM

## 2021-05-07 IMAGING — US US RENAL
1 series · 14 of 24 positions shown · non-contrast
Comparison: None.

CLINICAL DATA: Acute kidney injury, COVID positive

EXAM:
RENAL / URINARY TRACT ULTRASOUND COMPLETE

[Series 1: us renal · 14 of 24 slices shown]
[im 1/24]
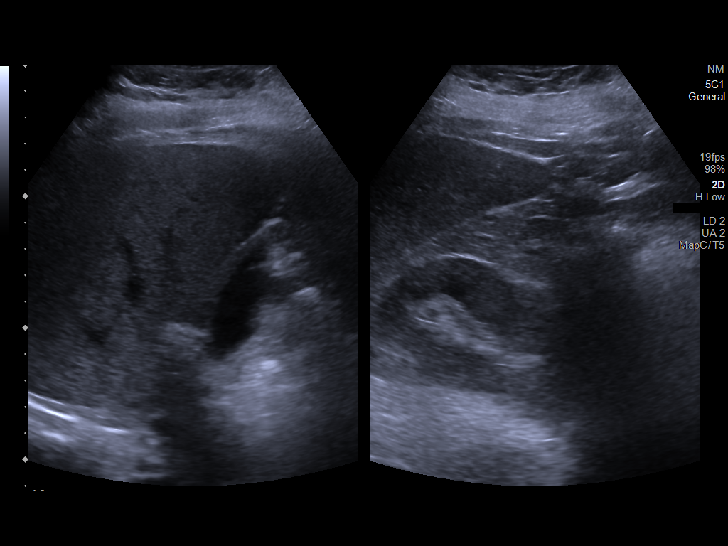
[im 3/24]
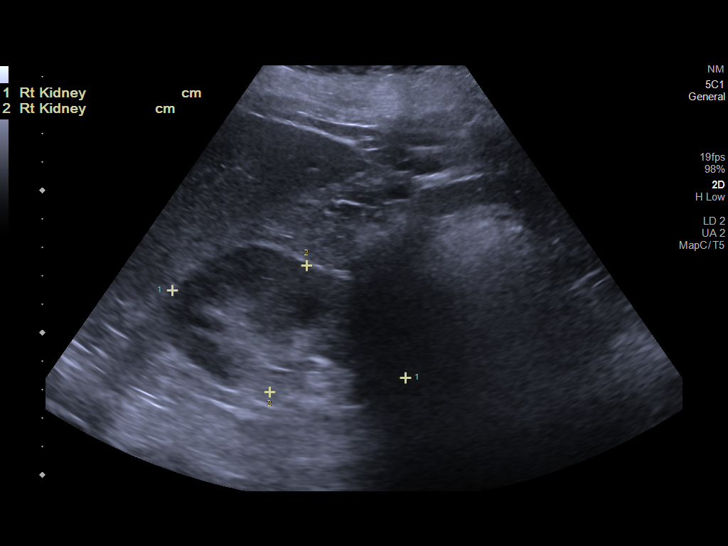
[im 5/24]
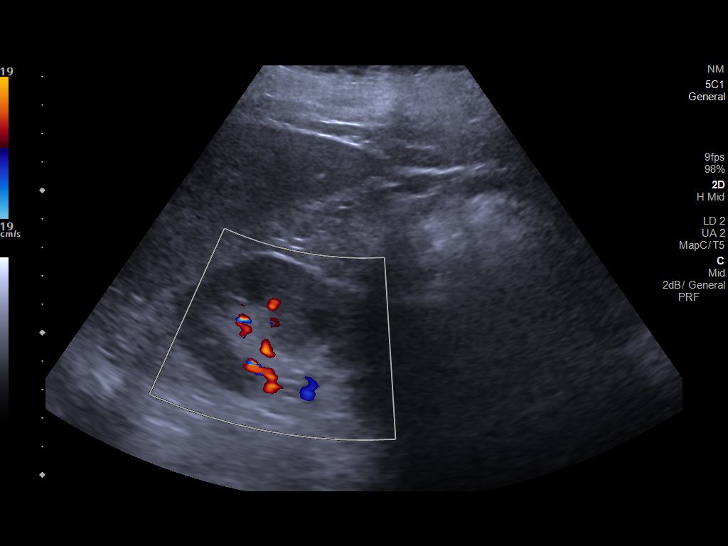
[im 7/24]
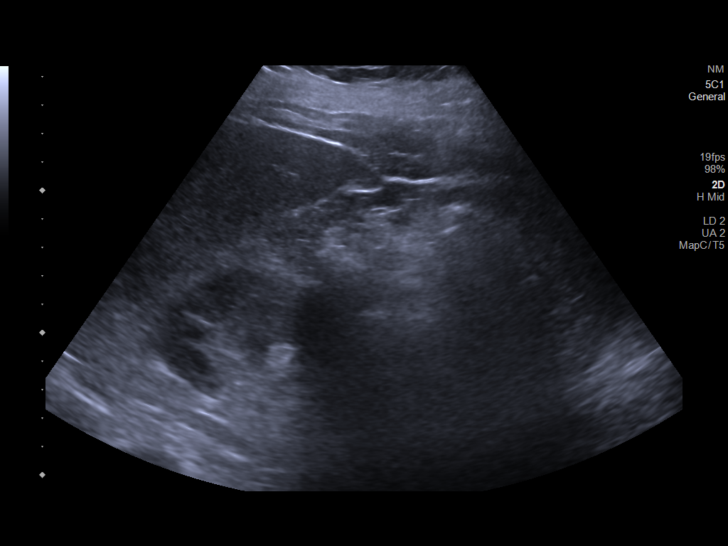
[im 8/24]
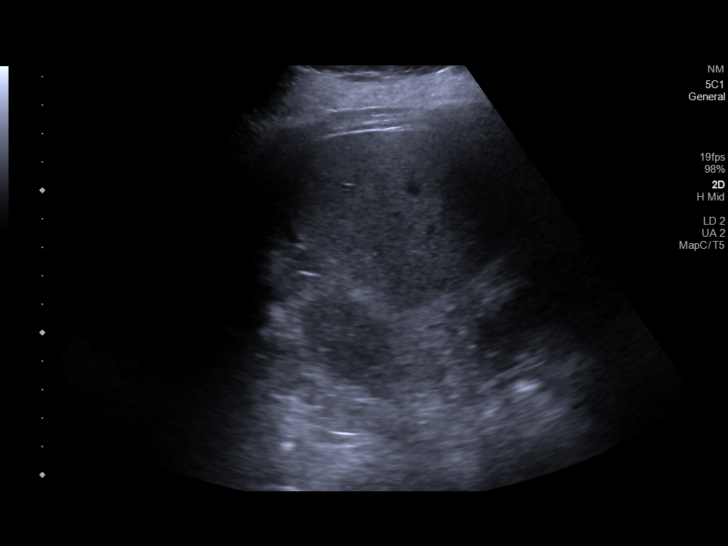
[im 10/24]
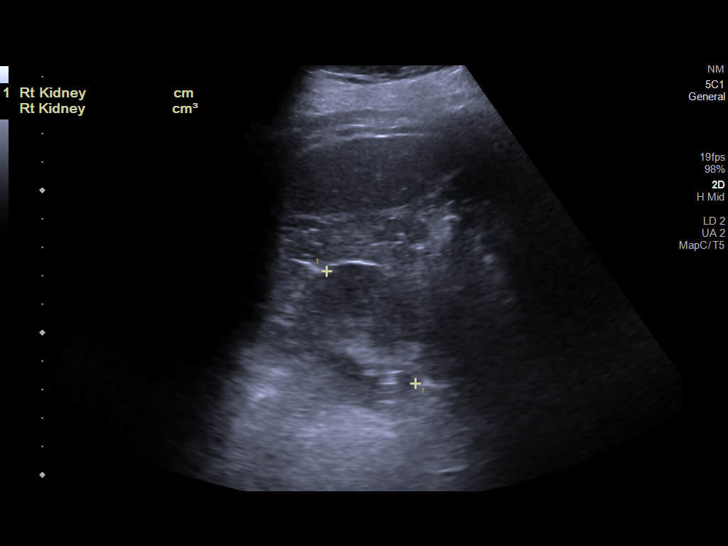
[im 12/24]
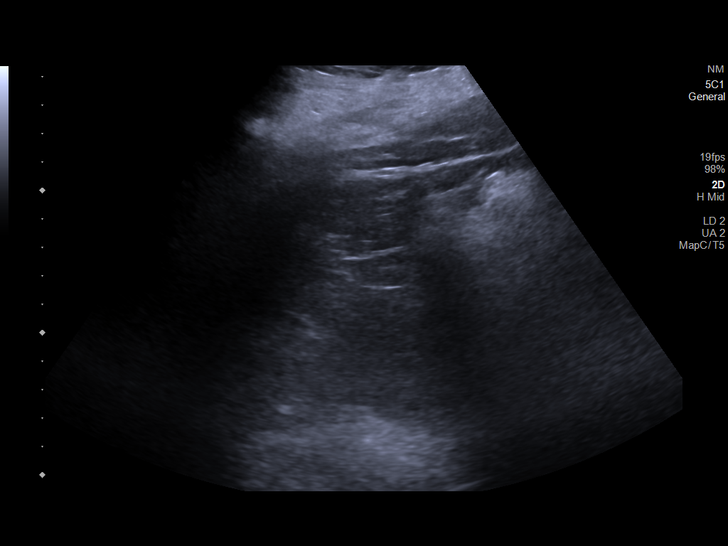
[im 13/24]
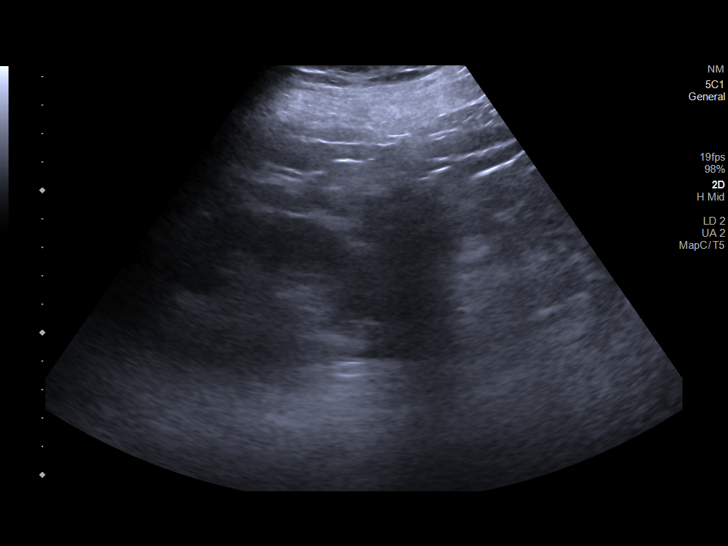
[im 15/24]
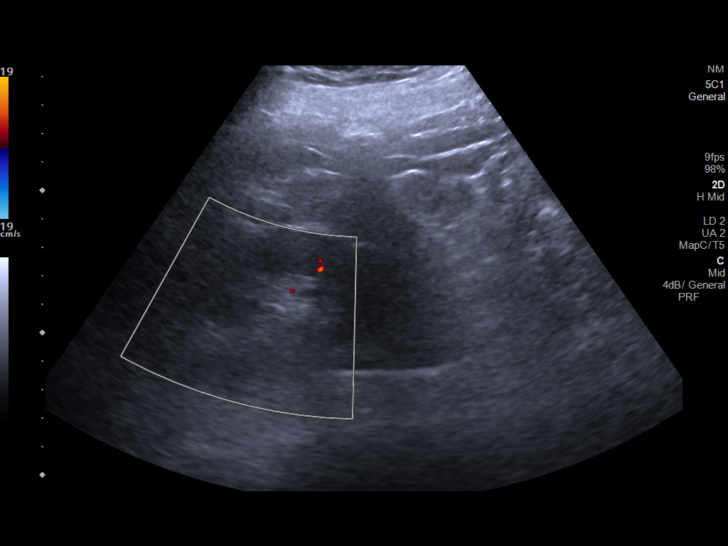
[im 17/24]
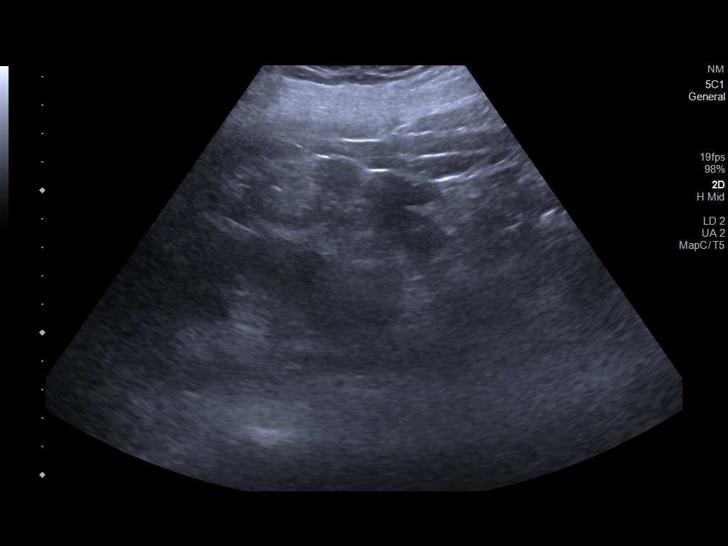
[im 19/24]
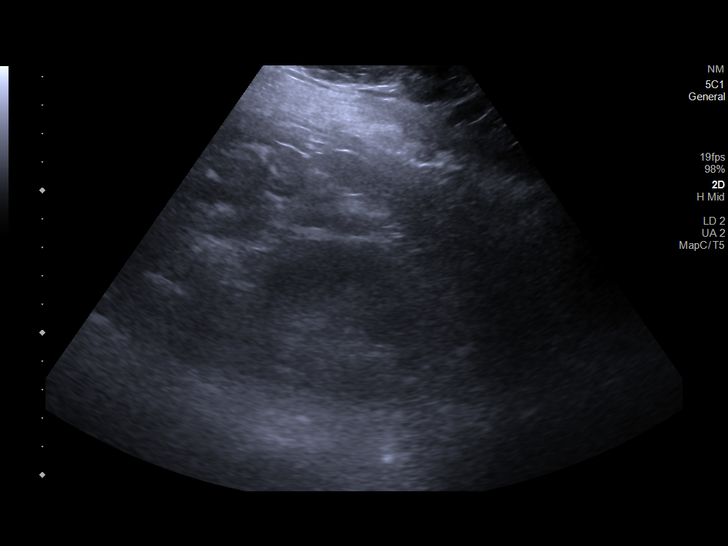
[im 20/24]
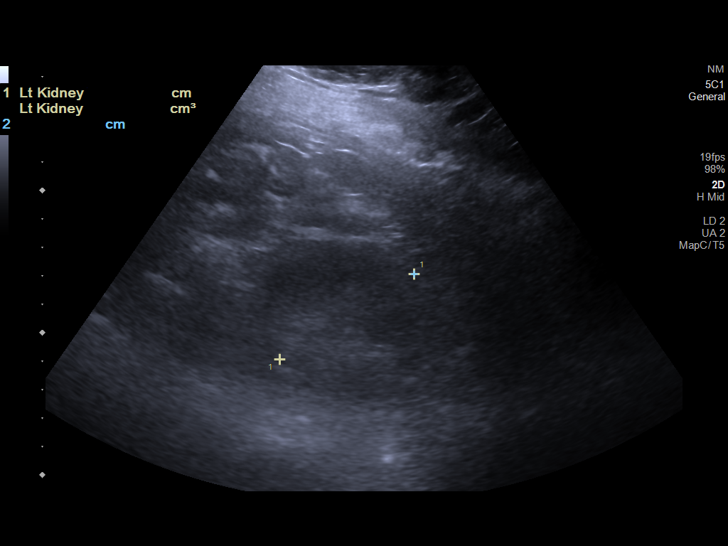
[im 22/24]
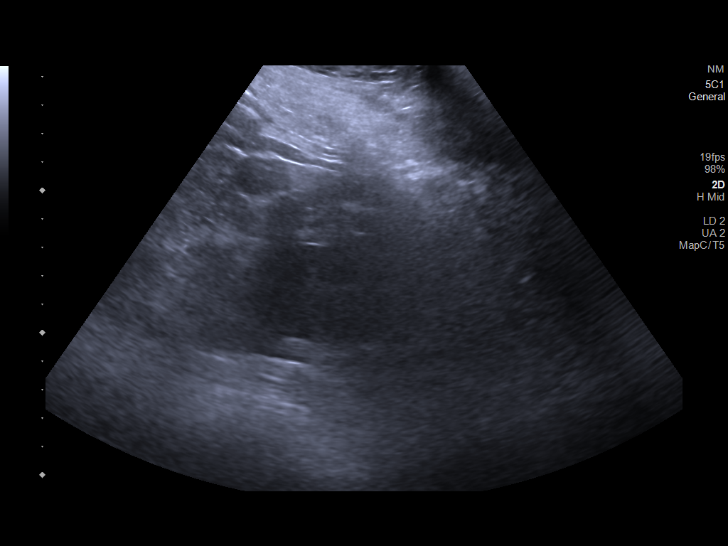
[im 24/24]
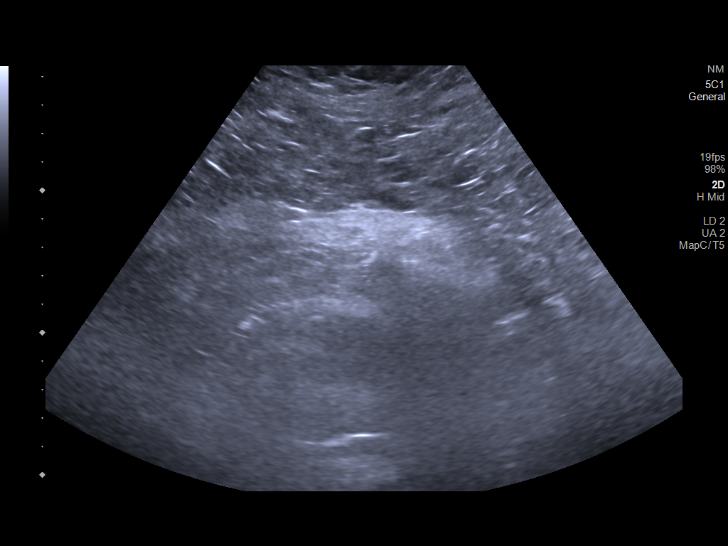

[14 of 24 positions shown; findings below may reference images not displayed]

FINDINGS: Right Kidney:

Renal measurements: 8.8 x 4.7 x 5.1 cm = volume: 107 mL .
Echogenicity within normal limits. No mass or hydronephrosis
visualized.

Left Kidney:

Renal measurements: 10.4 x 5.3 x 5.6 cm = volume: 160 mL.
Echogenicity within normal limits. No mass or hydronephrosis
visualized.

Bladder:

Not discretely visualized/underdistended.
IMPRESSION: Bilateral kidneys are within normal limits.  No hydronephrosis.

Bladder is not discretely visualized.

## 2021-06-23 DIAGNOSIS — L0231 Cutaneous abscess of buttock: Secondary | ICD-10-CM | POA: Diagnosis not present

## 2021-06-23 DIAGNOSIS — E1151 Type 2 diabetes mellitus with diabetic peripheral angiopathy without gangrene: Secondary | ICD-10-CM | POA: Diagnosis not present

## 2021-06-23 DIAGNOSIS — B3731 Acute candidiasis of vulva and vagina: Secondary | ICD-10-CM | POA: Diagnosis not present

## 2021-06-30 ENCOUNTER — Ambulatory Visit: Payer: Medicare HMO | Admitting: Podiatry

## 2021-07-09 DIAGNOSIS — N1831 Chronic kidney disease, stage 3a: Secondary | ICD-10-CM | POA: Diagnosis not present

## 2021-07-09 DIAGNOSIS — Z794 Long term (current) use of insulin: Secondary | ICD-10-CM | POA: Diagnosis not present

## 2021-07-09 DIAGNOSIS — E785 Hyperlipidemia, unspecified: Secondary | ICD-10-CM | POA: Diagnosis not present

## 2021-07-09 DIAGNOSIS — E1151 Type 2 diabetes mellitus with diabetic peripheral angiopathy without gangrene: Secondary | ICD-10-CM | POA: Diagnosis not present

## 2021-07-09 DIAGNOSIS — I129 Hypertensive chronic kidney disease with stage 1 through stage 4 chronic kidney disease, or unspecified chronic kidney disease: Secondary | ICD-10-CM | POA: Diagnosis not present

## 2021-08-04 ENCOUNTER — Ambulatory Visit (INDEPENDENT_AMBULATORY_CARE_PROVIDER_SITE_OTHER): Payer: Medicare HMO | Admitting: Podiatry

## 2021-08-04 ENCOUNTER — Other Ambulatory Visit: Payer: Self-pay

## 2021-08-04 DIAGNOSIS — E1142 Type 2 diabetes mellitus with diabetic polyneuropathy: Secondary | ICD-10-CM | POA: Diagnosis not present

## 2021-08-04 DIAGNOSIS — E1169 Type 2 diabetes mellitus with other specified complication: Secondary | ICD-10-CM

## 2021-08-04 DIAGNOSIS — L600 Ingrowing nail: Secondary | ICD-10-CM

## 2021-08-04 DIAGNOSIS — I739 Peripheral vascular disease, unspecified: Secondary | ICD-10-CM

## 2021-08-04 DIAGNOSIS — B351 Tinea unguium: Secondary | ICD-10-CM

## 2021-08-04 NOTE — Progress Notes (Signed)
°  Subjective:  Patient ID: Catherine Ramsey, female    DOB: 1945-08-23,  MRN: [id number]  No chief complaint on file.  76 y.o. female presents with the above complaint. History confirmed with patient. Reports pain in the left great toenail. Denies injury. Unsure how it happened  Reports neuropathy but also symptoms of cramping in her legs at night and would like to discuss this.  Objective:  Physical Exam: warm, good capillary refill, nail exam onychomycosis of the toenails, no trophic changes or ulcerative lesions. DP pulse faint, PT non-palp. Both feet mild decreased temperature. Lesser digital contractures bilat, HPK left sub hallux and 3rd toe  No images are attached to the encounter.  Assessment:   1. Onychomycosis of multiple toenails with type 2 diabetes mellitus and peripheral neuropathy ([place])    Plan:  Patient was evaluated and treated and all questions answered.  Onychomycosis and DPN -Patient is diabetic with a qualifying condition for at risk foot care. Upon debridement she appeared to have subungual abscesses bilat 1st toe. Will eval for PAD as potential reason for delayed healing. Also endorses cramping in her legs at night which could be 2/2 PAD. -Debrided nails x10. Advised to soak daily and apply abx ointment and band-aid thereafter. SSD and bandaid applied today. -Order NIVS -F/u in 2 weeks for recheck.     No follow-ups on file.

## 2021-08-07 DIAGNOSIS — E785 Hyperlipidemia, unspecified: Secondary | ICD-10-CM | POA: Diagnosis not present

## 2021-08-07 DIAGNOSIS — E1151 Type 2 diabetes mellitus with diabetic peripheral angiopathy without gangrene: Secondary | ICD-10-CM | POA: Diagnosis not present

## 2021-08-07 DIAGNOSIS — I10 Essential (primary) hypertension: Secondary | ICD-10-CM | POA: Diagnosis not present

## 2021-08-10 ENCOUNTER — Encounter (HOSPITAL_COMMUNITY): Payer: Medicare HMO

## 2021-08-14 DIAGNOSIS — K220 Achalasia of cardia: Secondary | ICD-10-CM | POA: Diagnosis not present

## 2021-08-14 DIAGNOSIS — K219 Gastro-esophageal reflux disease without esophagitis: Secondary | ICD-10-CM | POA: Diagnosis not present

## 2021-08-14 DIAGNOSIS — I129 Hypertensive chronic kidney disease with stage 1 through stage 4 chronic kidney disease, or unspecified chronic kidney disease: Secondary | ICD-10-CM | POA: Diagnosis not present

## 2021-08-14 DIAGNOSIS — E1151 Type 2 diabetes mellitus with diabetic peripheral angiopathy without gangrene: Secondary | ICD-10-CM | POA: Diagnosis not present

## 2021-08-14 DIAGNOSIS — I739 Peripheral vascular disease, unspecified: Secondary | ICD-10-CM | POA: Diagnosis not present

## 2021-08-14 DIAGNOSIS — Z1339 Encounter for screening examination for other mental health and behavioral disorders: Secondary | ICD-10-CM | POA: Diagnosis not present

## 2021-08-14 DIAGNOSIS — Z1331 Encounter for screening for depression: Secondary | ICD-10-CM | POA: Diagnosis not present

## 2021-08-14 DIAGNOSIS — Z1212 Encounter for screening for malignant neoplasm of rectum: Secondary | ICD-10-CM | POA: Diagnosis not present

## 2021-08-14 DIAGNOSIS — Z794 Long term (current) use of insulin: Secondary | ICD-10-CM | POA: Diagnosis not present

## 2021-08-14 DIAGNOSIS — R82998 Other abnormal findings in urine: Secondary | ICD-10-CM | POA: Diagnosis not present

## 2021-08-14 DIAGNOSIS — Z0000 Encounter for general adult medical examination without abnormal findings: Secondary | ICD-10-CM | POA: Diagnosis not present

## 2021-08-18 ENCOUNTER — Ambulatory Visit (INDEPENDENT_AMBULATORY_CARE_PROVIDER_SITE_OTHER): Payer: Medicare HMO

## 2021-08-18 ENCOUNTER — Other Ambulatory Visit: Payer: Self-pay

## 2021-08-18 ENCOUNTER — Ambulatory Visit (INDEPENDENT_AMBULATORY_CARE_PROVIDER_SITE_OTHER): Payer: Medicare HMO | Admitting: Podiatry

## 2021-08-18 DIAGNOSIS — I70221 Atherosclerosis of native arteries of extremities with rest pain, right leg: Secondary | ICD-10-CM

## 2021-08-18 DIAGNOSIS — U071 COVID-19: Secondary | ICD-10-CM | POA: Insufficient documentation

## 2021-08-18 DIAGNOSIS — S46811A Strain of other muscles, fascia and tendons at shoulder and upper arm level, right arm, initial encounter: Secondary | ICD-10-CM | POA: Insufficient documentation

## 2021-08-18 DIAGNOSIS — I739 Peripheral vascular disease, unspecified: Secondary | ICD-10-CM | POA: Diagnosis not present

## 2021-08-18 DIAGNOSIS — M542 Cervicalgia: Secondary | ICD-10-CM | POA: Insufficient documentation

## 2021-08-18 DIAGNOSIS — L97514 Non-pressure chronic ulcer of other part of right foot with necrosis of bone: Secondary | ICD-10-CM | POA: Diagnosis not present

## 2021-08-18 MED ORDER — TRAMADOL HCL 50 MG PO TABS: 50.0000 mg | ORAL_TABLET | Freq: Three times a day (TID) | ORAL | 0 refills | Status: AC | PRN

## 2021-08-18 NOTE — Progress Notes (Signed)
Subjective:  Patient ID: Catherine Ramsey, female    DOB: 1946-05-17,  MRN: [id number]  Chief Complaint  Patient presents with   Wound Check    5th toe right foot- swelling/ draining- very painful when applying pressure- further evaluation    76 y.o. female presents with the above complaint. History confirmed with patient. Thinks the big toes are much better but having a lot of pain to the 5th toe of the right foot. States it hurts to even have the sheet hit the toe. States she has to dangle her foot down over the side of the bed to help with the pain.   Objective:  Physical Exam: warm, good capillary refill, nail exam onychomycosis of the toenails, no trophic changes or ulcerative lesions. DP pulse faint, PT non-palp. Both feet mild decreased temperature. Lesser digital contractures bilat, HPK left sub hallux and 3rd toe.   New right 5th toe lesion with crust concerning for early gangrenous changes. Not warm to touch. Not erythematous. No active drainage. No fluctuance noted. ++TTP 5th toe.  Radiographs: taken and reviewed - erosive changes to the 5th toe distal phalanx. Assessment:   1. Ulcer of toe of right foot, with necrosis of bone ([place])   2. PAD (peripheral artery disease) ([place])    Plan:  Patient was evaluated and treated and all questions answered.  PAD, previous ulcer bilat 1st toes, new ulcer to 5th toe -1st toes much improved. No open ulceration at these areas today. -NIVS not performed as they were done in October. -New ulcer - gangrenous changes to the 5th toe -Order CBC, ESR, CRP, UA -Order MRI right foot - eval for osteomyelitis -Refer to Dr. Rolley Sims for eval. At last visit intervention not indicated as she had no tissue loss but now she could benefit from intervention.   No follow-ups on file.

## 2021-08-21 ENCOUNTER — Telehealth: Payer: Self-pay | Admitting: *Deleted

## 2021-08-21 DIAGNOSIS — L97514 Non-pressure chronic ulcer of other part of right foot with necrosis of bone: Secondary | ICD-10-CM | POA: Diagnosis not present

## 2021-08-21 NOTE — Telephone Encounter (Signed)
Patient is calling to request something else for pain, medicine (Tramadol is not helping) unable to put weight on the foot, 5th toe is bleeding w/ draining pus this morning. She has not had labs done yet because of so much pain and not being able to walk, will try to get over lab today.please advise.

## 2021-08-22 LAB — CBC WITH DIFFERENTIAL/PLATELET
Absolute Monocytes: 551 cells/uL (ref 200–950)
Basophils Absolute: 48 cells/uL (ref 0–200)
Basophils Relative: 0.5 %
Eosinophils Absolute: 247 cells/uL (ref 15–500)
Eosinophils Relative: 2.6 %
HCT: 35.4 % (ref 35.0–45.0)
Hemoglobin: 11.6 g/dL — ABNORMAL LOW (ref 11.7–15.5)
Lymphs Abs: 2385 cells/uL (ref 850–3900)
MCH: 28 pg (ref 27.0–33.0)
MCHC: 32.8 g/dL (ref 32.0–36.0)
MCV: 85.5 fL (ref 80.0–100.0)
MPV: 10.8 fL (ref 7.5–12.5)
Monocytes Relative: 5.8 %
Neutro Abs: 6270 cells/uL (ref 1500–7800)
Neutrophils Relative %: 66 %
Platelets: 235 10*3/uL (ref 140–400)
RBC: 4.14 10*6/uL (ref 3.80–5.10)
RDW: 12.7 % (ref 11.0–15.0)
Total Lymphocyte: 25.1 %
WBC: 9.5 10*3/uL (ref 3.8–10.8)

## 2021-08-22 LAB — SEDIMENTATION RATE: Sed Rate: 63 mm/h — ABNORMAL HIGH (ref 0–30)

## 2021-08-22 LAB — C-REACTIVE PROTEIN: CRP: 39.4 mg/L — ABNORMAL HIGH (ref <8.0)

## 2021-08-22 LAB — URIC ACID: Uric Acid, Serum: 4.6 mg/dL (ref 2.5–7.0)

## 2021-08-26 ENCOUNTER — Telehealth: Payer: Self-pay | Admitting: *Deleted

## 2021-08-26 NOTE — Telephone Encounter (Signed)
Patient is calling because still having problems w/ right foot, 5th toe and would like another refill of the Tramadol. Please advise.

## 2021-08-27 MED ORDER — TRAMADOL HCL 50 MG PO TABS
50.0000 mg | ORAL_TABLET | Freq: Three times a day (TID) | ORAL | 0 refills | Status: AC | PRN
Start: 2021-08-27 — End: 2021-09-01

## 2021-08-27 NOTE — Telephone Encounter (Signed)
Returned the call to patient to inform that medication has been sent to pharmacy,. She said that she has not heard anything from Cardiology or MRI scheduling. She said that her toe is looking better, has been eating a lot of pineapples.

## 2021-08-27 NOTE — Telephone Encounter (Signed)
Thank you so much Ammie.   Lattie Haw can we reach out to Cardiology and [facility] to follow-up on them and get her scheduled?

## 2021-09-05 ENCOUNTER — Other Ambulatory Visit: Payer: Self-pay

## 2021-09-05 ENCOUNTER — Ambulatory Visit
Admission: RE | Admit: 2021-09-05 | Discharge: 2021-09-05 | Disposition: A | Discharge status: Home / Self Care | Payer: Medicare HMO | Source: Ambulatory Visit | Attending: Podiatry | Admitting: Podiatry

## 2021-09-05 DIAGNOSIS — Z872 Personal history of diseases of the skin and subcutaneous tissue: Secondary | ICD-10-CM | POA: Diagnosis not present

## 2021-09-05 DIAGNOSIS — L97514 Non-pressure chronic ulcer of other part of right foot with necrosis of bone: Secondary | ICD-10-CM

## 2021-09-05 DIAGNOSIS — S91301A Unspecified open wound, right foot, initial encounter: Secondary | ICD-10-CM | POA: Diagnosis not present

## 2021-09-05 DIAGNOSIS — G8929 Other chronic pain: Secondary | ICD-10-CM | POA: Diagnosis not present

## 2021-09-08 ENCOUNTER — Other Ambulatory Visit: Payer: Self-pay

## 2021-09-08 ENCOUNTER — Ambulatory Visit: Payer: Medicare HMO | Admitting: Podiatry

## 2021-09-08 DIAGNOSIS — L97514 Non-pressure chronic ulcer of other part of right foot with necrosis of bone: Secondary | ICD-10-CM | POA: Diagnosis not present

## 2021-09-08 DIAGNOSIS — I739 Peripheral vascular disease, unspecified: Secondary | ICD-10-CM

## 2021-09-08 DIAGNOSIS — I70221 Atherosclerosis of native arteries of extremities with rest pain, right leg: Secondary | ICD-10-CM | POA: Diagnosis not present

## 2021-09-08 MED ORDER — DOXYCYCLINE HYCLATE 100 MG PO TABS: 100.0000 mg | ORAL_TABLET | Freq: Two times a day (BID) | ORAL | 0 refills | Status: DC

## 2021-09-08 MED ORDER — OXYCODONE HCL 5 MG PO TABS: 5.0000 mg | ORAL_TABLET | ORAL | 0 refills | Status: DC | PRN

## 2021-09-08 NOTE — Patient Instructions (Addendum)
Please call Dr. Erma Heritage office to set up an appointment with him to evaluate the blood flow to your foot:  [place] [facility] [address] [place], [place] [id number] [phone]  If the wound worsens - pain increases, you notice redness or heat at the area, or swelling or thick pus-like drainage proceed directly to the Emergency Room.

## 2021-09-08 NOTE — Progress Notes (Signed)
Subjective:  Patient ID: Catherine Ramsey, female    DOB: 07/12/1946,  MRN: [id number]  Chief Complaint  Patient presents with   Foot Ulcer    Ulcer of toe of right foot, with necrosis of bone [facility])  PAD      76 y.o. female presents with the above complaint. History confirmed with patient. Still having severe pain to the right 5th toe area. Tramadol was of no help. Still dangling her foot at night to relieve pain. Thinks the wound is worse - with 10/10 pain. Has not been dressing the area. Objective:  Physical Exam: warm, good capillary refill, nail exam onychomycosis of the toenails, no trophic changes or ulcerative lesions. DP pulse faint, PT non-palp. Both feet mild decreased temperature. Lesser digital contractures bilat, HPK left sub hallux and 3rd toe.   5th toe ulcer measuring 0.5x0.3 with extremely dry wound bed. Not warm to touch. Not erythematous. No active drainage. No fluctuance noted. ++TTP 5th toe.  MRI 2/4 Study Result  Narrative & Impression  CLINICAL DATA:  Chronic pain for 1 year. Soft tissue ulcer along the fifth digit.   EXAM: MRI OF THE RIGHT FOREFOOT WITHOUT CONTRAST   TECHNIQUE: Multiplanar, multisequence MR imaging of the right forefoot was performed. No intravenous contrast was administered.   COMPARISON:  None.   FINDINGS: Bones/Joint/Cartilage   Soft tissue wound overlying the fifth toe. Bone marrow edema in the fifth distal phalanx concerning for osteomyelitis. Bone marrow edema in the tuft of the first and fourth distal phalanx concerning for stress reaction versus osteomyelitis.   No acute fracture or dislocation. Normal alignment. No joint effusion.   Ligaments   Collateral ligaments are intact.  Lisfranc ligament is intact.   Muscles and Tendons   Flexor, peroneal and extensor compartment tendons are intact. Muscles are normal.   Soft tissue No fluid collection or hematoma. No soft tissue mass. Mild soft tissue edema along the  dorsal aspect of the foot extending into the fourth and fifth toes.   IMPRESSION: 1. Soft tissue wound overlying the fifth toe. Bone marrow edema in the fifth distal phalanx concerning for osteomyelitis. 2. Bone marrow edema in the tuft of the first and fourth distal phalanx concerning for stress reaction versus osteomyelitis.     Electronically Signed   By: Kathreen Devoid M.D.   On: 09/06/2021 15:41    Latest Reference Range & Units 08/21/21 13:57  CRP <8.0 mg/L 39.4 (H)  WBC 3.8 - 10.8 Thousand/uL 9.5  RBC 3.80 - 5.10 Million/uL 4.14  Hemoglobin 11.7 - 15.5 g/dL 11.6 (L)  HCT 35.0 - 45.0 % 35.4  MCV 80.0 - 100.0 fL 85.5  MCH 27.0 - 33.0 pg 28.0  MCHC 32.0 - 36.0 g/dL 32.8  RDW 11.0 - 15.0 % 12.7  Platelets 140 - 400 Thousand/uL 235  MPV 7.5 - 12.5 fL 10.8  Neutrophils % 66  Monocytes Relative % 5.8  Eosinophil % 2.6  Basophil % 0.5  NEUT# 1,500 - 7,800 cells/uL 6,270  Lymphocyte # 850 - 3,900 cells/uL 2,385  Total Lymphocyte % 25.1  Eosinophils Absolute 15 - 500 cells/uL 247  Basophils Absolute 0 - 200 cells/uL 48  Absolute Monocytes 200 - 950 cells/uL 551  Sed Rate 0 - 30 mm/h 63 (H)   Assessment:   1. Critical limb ischemia of right lower extremity (HCC)   2. PAD (peripheral artery disease) ([place])   3. Ulcer of toe of right foot, with necrosis of bone ([place])  Plan:  Patient was evaluated and treated and all questions answered.  PAD, ulcer 5th toe -5th toe stable, actually appears a little improved today. -Did not get appt for Dr. Rolley Sims for eval -  at last visit intervention not indicated as she had no tissue loss but now she could benefit from intervention. -Blood work reviewed - s/o chronic infection  -MRI confirm osteo of the 5th toe -Rx doxycycline given osteomyelitis.  -Increase pain medication to oxycodone. -Dressed with betadine WTD dressing. Leave intact until next visit. -Discussed that she does have osteomyelitis of the toe. We discussed she would  likely need amputation of the toe. She stated she does not want this. I discussed the importance of making sure she sees Dr. Rolley Sims so that we can try to improve the circulation and heal the ulceration. I am concerned that amputation will not heal in present state even if patient agrees to proceed with it.  Return in about 10 days (around 09/18/2021) for Wound Care.

## 2021-09-18 ENCOUNTER — Ambulatory Visit (INDEPENDENT_AMBULATORY_CARE_PROVIDER_SITE_OTHER): Payer: Medicare HMO | Admitting: Podiatry

## 2021-09-18 ENCOUNTER — Ambulatory Visit: Payer: Medicare HMO | Admitting: Podiatry

## 2021-09-18 ENCOUNTER — Other Ambulatory Visit: Payer: Self-pay

## 2021-09-18 DIAGNOSIS — I739 Peripheral vascular disease, unspecified: Secondary | ICD-10-CM

## 2021-09-18 DIAGNOSIS — I96 Gangrene, not elsewhere classified: Secondary | ICD-10-CM | POA: Diagnosis not present

## 2021-09-18 DIAGNOSIS — I70221 Atherosclerosis of native arteries of extremities with rest pain, right leg: Secondary | ICD-10-CM

## 2021-09-22 DIAGNOSIS — E119 Type 2 diabetes mellitus without complications: Secondary | ICD-10-CM | POA: Diagnosis not present

## 2021-09-22 NOTE — Progress Notes (Signed)
Subjective:  Patient ID: Catherine Ramsey, female    DOB: 08/14/1945,  MRN: [id number]  Chief Complaint  Patient presents with   Wound Check    Wound care     76 y.o. female presents with the above complaint.  Patient presents with right fifth digit pain.  Patient states doing a lot better than last time when she saw Dr. March Rummage however there is still some duskiness changes to the fifth toe.  She states that she is having some pain in the toe has progressed gotten worse.  She had vascular studies done and October which shows claudication type of symptoms.  She was seen by Dr. Alvester Chou at that time there was no wound and therefore no intervention was indicated however patient is experiencing dusky changes to the fifth toe which I believe she will benefit from a neurovascular assessment followed by vascular surgery eval.   Review of Systems: Negative except as noted in the HPI. Denies N/V/F/Ch.  Past Medical History:  Diagnosis Date   Achalasia - Type II 05/08/2010   Qualifier: Diagnosis of  By: Carlean Purl MD, Tonna Boehringer E    Acute kidney injury ([place]) 02/26/2019   Candida esophagitis ([place]) 03/26/2014   COVID-19 virus detected 02/25/2019   Diabetes mellitus    type 2   Family history of adverse reaction to anesthesia    son hard to wake up   Hypertension    Syncope 02/25/2019   Trigger ring finger of right hand 10/07/2016    Current Outpatient Medications:    acetaminophen (TYLENOL) 325 MG tablet, Take 2 tablets (650 mg total) by mouth every 6 (six) hours as needed for mild pain (or Fever >/= 101)., Disp: , Rfl:    Alcohol Swabs (B-D SINGLE USE SWABS REGULAR) PADS, , Disp: , Rfl:    amLODipine (NORVASC) 10 MG tablet, Take 10 mg by mouth daily., Disp: , Rfl:    amLODipine (NORVASC) 10 MG tablet, Take 1 tablet by mouth daily., Disp: , Rfl:    Blood Glucose Monitoring Suppl (TRUE METRIX AIR GLUCOSE METER) w/Device KIT, , Disp: , Rfl:    CINNAMON PO, Take by mouth., Disp: , Rfl:    doxycycline  (VIBRA-TABS) 100 MG tablet, Take 1 tablet (100 mg total) by mouth 2 (two) times daily., Disp: 14 tablet, Rfl: 0   empagliflozin (JARDIANCE) 10 MG TABS tablet, 1 tablet, Disp: , Rfl:    gabapentin (NEURONTIN) 300 MG capsule, Take 300 mg by mouth daily as needed (pain)., Disp: , Rfl:    insulin aspart (NOVOLOG) 100 UNIT/ML FlexPen, Inject 8 Units into the skin 3 (three) times daily with meals. Sliding scale, Disp: , Rfl:    Insulin Syringe-Needle U-100 (BD INSULIN SYRINGE ULTRAFINE) 31G X 5/16" 0.5 ML MISC, Use one syringe twice daily DX:  250.70, Disp: , Rfl:    KLOR-CON M20 20 MEQ tablet, Take 20 mEq by mouth daily., Disp: , Rfl: 0   LANTUS SOLOSTAR 100 UNIT/ML Solostar Pen, , Disp: , Rfl:    losartan-hydrochlorothiazide (HYZAAR) 100-25 MG tablet, Take 1 tablet by mouth daily., Disp: , Rfl:    omeprazole (PRILOSEC) 20 MG capsule, , Disp: , Rfl:    omeprazole (PRILOSEC) 20 MG capsule, Take 1 capsule by mouth at bedtime., Disp: , Rfl:    oxyCODONE (ROXICODONE) 5 MG immediate release tablet, Take 1 tablet (5 mg total) by mouth every 4 (four) hours as needed for severe pain., Disp: 30 tablet, Rfl: 0   pravastatin (PRAVACHOL) 40 MG tablet,  Take 40 mg by mouth at bedtime., Disp: , Rfl:    TRUE METRIX BLOOD GLUCOSE TEST test strip, , Disp: , Rfl:    TRUEplus Lancets 33G MISC, , Disp: , Rfl:   Social History   Tobacco Use  Smoking Status Never  Smokeless Tobacco Never    No Known Allergies Objective:  There were no vitals filed for this visit. There is no height or weight on file to calculate BMI. Constitutional Well developed. Well nourished.  Vascular Dorsalis pedis pulses palpable bilaterally. Posterior tibial pulses palpable bilaterally. Capillary refill normal to all digits.  No cyanosis or clubbing noted. Pedal hair growth normal.  Neurologic Normal speech. Oriented to person, place, and time. Epicritic sensation to light touch grossly present bilaterally.  Dermatologic Nails well  groomed and normal in appearance. No open wounds. No skin lesions.  Orthopedic: Right fifth digit dusky/gangrene changes.  No ulceration noted.  The toe is dry and stable.  No signs of wet gangrene noted.  No malodor present.  No purulent drainage noted.   Radiographs: None Assessment:   1. Critical limb ischemia of right lower extremity (HCC)   2. PAD (peripheral artery disease) (HCC)   3. Gangrene of toe of right foot ([place])    Plan:  Patient was evaluated and treated and all questions answered.  Right fifth digit gangrene dusky -All questions and concerns were discussed with the patient in extensive detail -Given the dusky changes to the fifth toe I believe patient will benefit from ABIs and vascular work-up.  She will be scheduled to see outpatient imaging to obtain ABIs PVRs to assess the flow to the right lower extremity. -Previous imaging shows that there is occlusion noted. -She may immediately go over to vascular surgery as well given that she does have 24-monthold study that showed vascular compromise.  She states understanding. -For now we will continue clinical monitor local wound care.  I encouraged her to do Betadine wet-to-dry dressing.  If it continues to get worse we will discuss other treatment options.  She is a high risk of losing the fifth toe.  No follow-ups on file.

## 2021-09-24 ENCOUNTER — Other Ambulatory Visit: Payer: Self-pay

## 2021-09-24 ENCOUNTER — Ambulatory Visit (HOSPITAL_COMMUNITY)
Admission: RE | Admit: 2021-09-24 | Discharge: 2021-09-24 | Disposition: A | Discharge status: Home / Self Care | Payer: Medicare HMO | Source: Ambulatory Visit | Attending: Podiatry | Admitting: Podiatry

## 2021-09-24 DIAGNOSIS — I70221 Atherosclerosis of native arteries of extremities with rest pain, right leg: Secondary | ICD-10-CM | POA: Diagnosis not present

## 2021-09-28 ENCOUNTER — Other Ambulatory Visit: Payer: Self-pay | Admitting: Podiatry

## 2021-09-28 DIAGNOSIS — I739 Peripheral vascular disease, unspecified: Secondary | ICD-10-CM

## 2021-09-28 DIAGNOSIS — I70221 Atherosclerosis of native arteries of extremities with rest pain, right leg: Secondary | ICD-10-CM

## 2021-10-01 ENCOUNTER — Other Ambulatory Visit: Payer: Self-pay | Admitting: Interventional Radiology

## 2021-10-01 ENCOUNTER — Ambulatory Visit
Admission: RE | Admit: 2021-10-01 | Discharge: 2021-10-01 | Disposition: A | Discharge status: Home / Self Care | Payer: Medicare HMO | Source: Ambulatory Visit | Attending: Podiatry | Admitting: Podiatry

## 2021-10-01 ENCOUNTER — Encounter: Payer: Self-pay | Admitting: *Deleted

## 2021-10-01 ENCOUNTER — Other Ambulatory Visit: Payer: Self-pay | Admitting: Podiatry

## 2021-10-01 DIAGNOSIS — I70221 Atherosclerosis of native arteries of extremities with rest pain, right leg: Secondary | ICD-10-CM

## 2021-10-01 DIAGNOSIS — I70223 Atherosclerosis of native arteries of extremities with rest pain, bilateral legs: Secondary | ICD-10-CM

## 2021-10-01 DIAGNOSIS — I739 Peripheral vascular disease, unspecified: Secondary | ICD-10-CM

## 2021-10-01 HISTORY — PX: IR RADIOLOGIST EVAL & MGMT: IMG5224

## 2021-10-01 MED ORDER — DOXYCYCLINE HYCLATE 100 MG PO TABS: 100.0000 mg | ORAL_TABLET | Freq: Two times a day (BID) | ORAL | 0 refills | Status: DC

## 2021-10-01 NOTE — Consult Note (Signed)
Chief Complaint: Patient was seen in consultation today for critical limb ischemia at the request of Patel,Kevin P  Referring Physician(s): Patel,Kevin P  History of Present Illness: Catherine Ramsey is a 76 y.o. female who presents at the kind request of Dr. Boneta Lucks for a slowly healing wound of the right fifth digit.  However, at the time of presentation today she reports to me that her right fifth toe has been doing much better after a course of antibiotics (now completed), but she is more concerned about her left toe which has been hurting her since her last nail trimming.  She is a longstanding diabetic and is insulin-dependent.  She has never been a smoker.  She does have hypertension and hyperlipidemia both of which are well controlled with medications.  She reports rest pain in the left lower extremity while lying down at night.  This is improved slightly if she dangles her foot off the bed.  This is another indicator of advanced peripheral arterial disease on the left.  Her right foot has been feeling quite a bit better.  She denies symptoms of claudication.  She is able to walk as far as she wants without having muscular pain or feeling like she needs to stop and rest.  She denies fever, chills or other active symptoms at this time.  Past Medical History:  Diagnosis Date   Achalasia - Type II 05/08/2010   Qualifier: Diagnosis of  By: Carlean Purl MD, Tonna Boehringer E    Acute kidney injury ([place]) 02/26/2019   Candida esophagitis ([place]) 03/26/2014   COVID-19 virus detected 02/25/2019   Diabetes mellitus    type 2   Family history of adverse reaction to anesthesia    son hard to wake up   Hypertension    Syncope 02/25/2019   Trigger ring finger of right hand 10/07/2016    Past Surgical History:  Procedure Laterality Date   ABDOMINAL HYSTERECTOMY     BREAST BIOPSY     left/benign   COLONOSCOPY  01/31/2014   ESOPHAGEAL MANOMETRY N/A 04/01/2014   Procedure: ESOPHAGEAL MANOMETRY  (EM);  Surgeon: Gatha Mayer, MD;  Location: [facility];  Service: Endoscopy;  Laterality: N/A;   HELLER MYOTOMY N/A 12/07/2016   Procedure: LAPAROSCOPIC HELLER MYOTOMY, DOR FUNDOPLICATION, HIATAL HERNIA REPAIR, INTRAOPERATIVE UPPER ENDOSCOPY;  Surgeon: Jackolyn Confer, MD;  Location: [facility];  Service: General;  Laterality: N/A;   TOTAL HIP ARTHROPLASTY     right pin then replacement    Allergies: Patient has no known allergies.  Medications: Prior to Admission medications   Medication Sig Start Date End Date Taking? Authorizing Provider  acetaminophen (TYLENOL) 325 MG tablet Take 2 tablets (650 mg total) by mouth every 6 (six) hours as needed for mild pain (or Fever >/= 101). 03/01/19   Nita Sells, MD  Alcohol Swabs (B-D SINGLE USE SWABS REGULAR) PADS  12/10/19   [provider]  amLODipine (NORVASC) 10 MG tablet Take 10 mg by mouth daily.    [provider]  amLODipine (NORVASC) 10 MG tablet Take 1 tablet by mouth daily.    [provider]  Blood Glucose Monitoring Suppl (TRUE METRIX AIR GLUCOSE METER) w/Device KIT  11/30/19   [provider]  CINNAMON PO Take by mouth.    [provider]  doxycycline (VIBRA-TABS) 100 MG tablet Take 1 tablet (100 mg total) by mouth 2 (two) times daily. 09/08/21   Evelina Bucy, DPM  empagliflozin (  JARDIANCE) 10 MG TABS tablet 1 tablet    [provider]  gabapentin (NEURONTIN) 300 MG capsule Take 300 mg by mouth daily as needed (pain).    [provider]  insulin aspart (NOVOLOG) 100 UNIT/ML FlexPen Inject 8 Units into the skin 3 (three) times daily with meals. Sliding scale    [provider]  Insulin Syringe-Needle U-100 (BD INSULIN SYRINGE ULTRAFINE) 31G X 5/16" 0.5 ML MISC Use one syringe twice daily DX:  250.70 09/26/12   [provider]  KLOR-CON M20 20 MEQ tablet Take 20 mEq by mouth daily. 09/06/17   [provider]  LANTUS SOLOSTAR 100 UNIT/ML  Solostar Pen  10/24/19   [provider]  losartan-hydrochlorothiazide (HYZAAR) 100-25 MG tablet Take 1 tablet by mouth daily.    [provider]  omeprazole (PRILOSEC) 20 MG capsule  09/10/20   [provider]  omeprazole (PRILOSEC) 20 MG capsule Take 1 capsule by mouth at bedtime.    [provider]  oxyCODONE (ROXICODONE) 5 MG immediate release tablet Take 1 tablet (5 mg total) by mouth every 4 (four) hours as needed for severe pain. 09/08/21   Evelina Bucy, DPM  pravastatin (PRAVACHOL) 40 MG tablet Take 40 mg by mouth at bedtime. 12/02/18   [provider]  TRUE METRIX BLOOD GLUCOSE TEST test strip  11/30/19   [provider]  TRUEplus Lancets 33G MISC  11/30/19   [provider]     Family History  Problem Relation Age of Onset   Diabetes Mother        died at 81 from Covid-19   Hypertension Mother    Hyperlipidemia Mother    Breast cancer Mother    Diabetes Father    Hypertension Father    Hypertension Sister    Hypertension Sister    Colon polyps Daughter    Colon cancer Neg Hx    Esophageal cancer Neg Hx    Rectal cancer Neg Hx    Stomach cancer Neg Hx     Social History   Socioeconomic History   Marital status: Married    Spouse name: Not on file   Number of children: Not on file   Years of education: Not on file   Highest education level: Not on file  Occupational History   Not on file  Tobacco Use   Smoking status: Never   Smokeless tobacco: Never  Vaping Use   Vaping Use: Never used  Substance and Sexual Activity   Alcohol use: No   Drug use: No   Sexual activity: Yes  Other Topics Concern   Not on file  Social History Narrative   Not on file   Social Determinants of Health   Financial Resource Strain: Not on file  Food Insecurity: Not on file  Transportation Needs: Not on file  Physical Activity: Not on file  Stress: Not on file  Social Connections: Not on file    Review of Systems: A  12 point ROS discussed and pertinent positives are indicated in the HPI above.  All other systems are negative.  Review of Systems  Vital Signs: BP (!) 159/65 (BP Location: Right Arm)    Pulse 75    SpO2 95%   Physical Exam Constitutional:      Appearance: Normal appearance.  HENT:     Head: Normocephalic and atraumatic.  Eyes:     General: No scleral icterus. Cardiovascular:     Rate and Rhythm: Normal rate.  Pulses:          Dorsalis pedis pulses are detected w/ Doppler on the right side and detected w/ Doppler on the left side.       Posterior tibial pulses are 0 on the right side and 0 on the left side.     Comments: Monophasic DP on the left, biphasic on the right.  Pulmonary:     Effort: Pulmonary effort is normal.  Abdominal:     Palpations: Abdomen is soft.  Musculoskeletal:       Feet:  Feet:     Comments: Crusting noted right 5th toe.  No active ulceration or evidence of gangrene at this time.   Left great toe shows separation of the nail bed from the skin with oozing and small purulence.  The toe is mottled and erythematous.  Neurological:     Mental Status: She is alert and oriented to person, place, and time.  Psychiatric:        Mood and Affect: Mood normal.        Behavior: Behavior normal.      Imaging: MR FOOT RIGHT WO CONTRAST  Result Date: 09/06/2021 CLINICAL DATA:  Chronic pain for 1 year. Soft tissue ulcer along the fifth digit. EXAM: MRI OF THE RIGHT FOREFOOT WITHOUT CONTRAST TECHNIQUE: Multiplanar, multisequence MR imaging of the right forefoot was performed. No intravenous contrast was administered. COMPARISON:  None. FINDINGS: Bones/Joint/Cartilage Soft tissue wound overlying the fifth toe. Bone marrow edema in the fifth distal phalanx concerning for osteomyelitis. Bone marrow edema in the tuft of the first and fourth distal phalanx concerning for stress reaction versus osteomyelitis. No acute fracture or dislocation. Normal alignment. No joint  effusion. Ligaments Collateral ligaments are intact.  Lisfranc ligament is intact. Muscles and Tendons Flexor, peroneal and extensor compartment tendons are intact. Muscles are normal. Soft tissue No fluid collection or hematoma. No soft tissue mass. Mild soft tissue edema along the dorsal aspect of the foot extending into the fourth and fifth toes. IMPRESSION: 1. Soft tissue wound overlying the fifth toe. Bone marrow edema in the fifth distal phalanx concerning for osteomyelitis. 2. Bone marrow edema in the tuft of the first and fourth distal phalanx concerning for stress reaction versus osteomyelitis. Electronically Signed   By: Kathreen Devoid M.D.   On: 09/06/2021 15:41   VAS [place] ABI WITH/WO TBI  Result Date: 09/25/2021  LOWER EXTREMITY DOPPLER STUDY Patient Name:  LORIS SEELYE  Date of Exam:   09/24/2021 Medical Rec #: [id number]        Accession #:    [id number] Date of Birth: 09-Jan-1946        Patient Gender: F Patient Age:   54 years Exam Location:  [facility] Procedure:      VAS [place] ABI WITH/WO TBI Referring Phys: Lennette Bihari PATEL --------------------------------------------------------------------------------  Indications: Claudication, ulceration, and peripheral artery disease. High Risk Factors: Hypertension, hyperlipidemia, Diabetes.  Comparison Study: 05/05/2021: Rt ABI 0.75; Lt ABI 0.70 Performing Technologist: Ivan Croft  Examination Guidelines: A complete evaluation includes at minimum, Doppler waveform signals and systolic blood pressure reading at the level of bilateral brachial, anterior tibial, and posterior tibial arteries, when vessel segments are accessible. Bilateral testing is considered an integral part of a complete examination. Photoelectric Plethysmograph (PPG) waveforms and toe systolic pressure readings are included as required and additional duplex testing as needed. Limited examinations for reoccurring indications may be performed as noted.  ABI Findings:  +---------+------------------+-----+----------+--------+  Right  Rt Pressure (mmHg) Index Waveform   Comment   +---------+------------------+-----+----------+--------+  Brachial  124                                           +---------+------------------+-----+----------+--------+  PTA       77                 0.62  monophasic           +---------+------------------+-----+----------+--------+  DP        75                 0.60  monophasic           +---------+------------------+-----+----------+--------+  Great Toe                          Absent               +---------+------------------+-----+----------+--------+ +---------+------------------+-----+----------+---------------+  Left      Lt Pressure (mmHg) Index Waveform   Comment          +---------+------------------+-----+----------+---------------+  Brachial  125                                                  +---------+------------------+-----+----------+---------------+  PTA                                absent     cannot insonate  +---------+------------------+-----+----------+---------------+  DP        88                 0.70  monophasic                  +---------+------------------+-----+----------+---------------+  Great Toe                          Absent                      +---------+------------------+-----+----------+---------------+ +-------+-----------+-----------+------------+------------+  ABI/TBI Today's ABI Today's TBI Previous ABI Previous TBI  +-------+-----------+-----------+------------+------------+  Right   0.62        0           0.75         0.16          +-------+-----------+-----------+------------+------------+  Left    0.70        0           0.70         0.16          +-------+-----------+-----------+------------+------------+   Summary: Right: Resting right ankle-brachial index indicates moderate right lower extremity arterial disease. Left: Resting left ankle-brachial index indicates moderate left lower extremity  arterial disease.  *See table(s) above for measurements and observations.  Electronically signed by Orlie Pollen on 09/25/2021 at 5:52:16 PM.    Final     Labs:  CBC: Recent Labs    12/31/20 1729 08/21/21 1357  WBC 11.4* 9.5  HGB 10.9* 11.6*  HCT 35.1* 35.4  PLT 292 235    COAGS: No results for input(s): INR, APTT in the last 8760 hours.  BMP: Recent Labs    12/31/20 1729  NA 136  K 3.8  CL 99  CO2 25  GLUCOSE 194*  BUN 24*  CALCIUM 9.3  CREATININE 1.30*  GFRNONAA 43*    LIVER FUNCTION TESTS: No results for input(s): BILITOT, AST, ALT, ALKPHOS, PROT, ALBUMIN in the last 8760 hours.  TUMOR MARKERS: No results for input(s): AFPTM, CEA, CA199, CHROMGRNA in the last 8760 hours.  Assessment and Plan:  Very pleasant 76 year old female with longstanding insulin-dependent diabetes.  She has a healing wound of the right fifth digit, and an active wound with evidence of infection involving her left great toe.  There is undermining of the nailbed with a small amount of purulent drainage. I'm very concerned for a significant developing infection here, particularly given the gapping between the nail edge and the skin of the distal toe.  Vascular exam demonstrates abnormal monophasic arterial waveforms in the anterior tibial/dorsalis pedis distribution and absent waveform in the posterior tibial distribution consistent with significant peripheral arterial disease.  I believe she has Rutherford category 5 critical limb ischemia.  1.)  I asked her to reach out to the podiatry office to be seen today or tomorrow if possible so they can visualize the new changes to her left great toe and get her started on antibiotic therapy.  2.)  CTA Runoff as soon as possible for procedural planning.  3.) I will have my office get her scheduled for a bilateral lower extremity arteriogram with likely left lower extremity intervention to be performed as soon as possible.  I am out of town next week,  if one of my partners (Dr. Earleen Newport or Dr. Serafina Royals) are unable to treat her next week, we will target the following week of March 13 - March 17.    4.) Depending on if the right fifth toe continues to heal in the interim, we may also pursue right lower extremity intervention at a later time.  Thank you for this interesting consult.  I greatly enjoyed meeting AIRIKA ALKHATIB and look forward to participating in their care.  A copy of this report was sent to the requesting provider on this date.  Electronically Signed: Criselda Peaches 10/01/2021, 2:09 PM   I spent a total of  40 Minutes  in face to face in clinical consultation, greater than 50% of which was counseling/coordinating care for  critical limb ischemia.

## 2021-10-02 DIAGNOSIS — H2513 Age-related nuclear cataract, bilateral: Secondary | ICD-10-CM | POA: Diagnosis not present

## 2021-10-02 DIAGNOSIS — H2512 Age-related nuclear cataract, left eye: Secondary | ICD-10-CM | POA: Diagnosis not present

## 2021-10-02 DIAGNOSIS — I10 Essential (primary) hypertension: Secondary | ICD-10-CM | POA: Diagnosis not present

## 2021-10-02 DIAGNOSIS — Z794 Long term (current) use of insulin: Secondary | ICD-10-CM | POA: Diagnosis not present

## 2021-10-02 DIAGNOSIS — H25013 Cortical age-related cataract, bilateral: Secondary | ICD-10-CM | POA: Diagnosis not present

## 2021-10-02 DIAGNOSIS — E119 Type 2 diabetes mellitus without complications: Secondary | ICD-10-CM | POA: Diagnosis not present

## 2021-10-05 ENCOUNTER — Ambulatory Visit (HOSPITAL_COMMUNITY)
Admission: RE | Admit: 2021-10-05 | Discharge: 2021-10-05 | Disposition: A | Discharge status: Home / Self Care | Payer: Medicare HMO | Source: Ambulatory Visit | Attending: Interventional Radiology | Admitting: Interventional Radiology

## 2021-10-05 ENCOUNTER — Other Ambulatory Visit: Payer: Self-pay

## 2021-10-05 DIAGNOSIS — I743 Embolism and thrombosis of arteries of the lower extremities: Secondary | ICD-10-CM | POA: Diagnosis not present

## 2021-10-05 DIAGNOSIS — K439 Ventral hernia without obstruction or gangrene: Secondary | ICD-10-CM | POA: Diagnosis not present

## 2021-10-05 DIAGNOSIS — I70223 Atherosclerosis of native arteries of extremities with rest pain, bilateral legs: Secondary | ICD-10-CM | POA: Insufficient documentation

## 2021-10-05 DIAGNOSIS — Z87828 Personal history of other (healed) physical injury and trauma: Secondary | ICD-10-CM | POA: Diagnosis not present

## 2021-10-05 DIAGNOSIS — I7409 Other arterial embolism and thrombosis of abdominal aorta: Secondary | ICD-10-CM | POA: Diagnosis not present

## 2021-10-05 LAB — POCT I-STAT CREATININE: Creatinine, Ser: 1.3 mg/dL — ABNORMAL HIGH (ref 0.44–1.00)

## 2021-10-05 MED ORDER — IOHEXOL 350 MG/ML SOLN
100.0000 mL | Freq: Once | INTRAVENOUS | Status: AC | PRN
  Administered 2021-10-05: 100 mL via INTRAVENOUS

## 2021-10-07 DIAGNOSIS — N1831 Chronic kidney disease, stage 3a: Secondary | ICD-10-CM | POA: Diagnosis not present

## 2021-10-07 DIAGNOSIS — E1151 Type 2 diabetes mellitus with diabetic peripheral angiopathy without gangrene: Secondary | ICD-10-CM | POA: Diagnosis not present

## 2021-10-07 DIAGNOSIS — E785 Hyperlipidemia, unspecified: Secondary | ICD-10-CM | POA: Diagnosis not present

## 2021-10-07 DIAGNOSIS — Z794 Long term (current) use of insulin: Secondary | ICD-10-CM | POA: Diagnosis not present

## 2021-10-07 DIAGNOSIS — I129 Hypertensive chronic kidney disease with stage 1 through stage 4 chronic kidney disease, or unspecified chronic kidney disease: Secondary | ICD-10-CM | POA: Diagnosis not present

## 2021-10-13 ENCOUNTER — Other Ambulatory Visit (HOSPITAL_COMMUNITY): Payer: Self-pay | Admitting: Interventional Radiology

## 2021-10-13 DIAGNOSIS — I70223 Atherosclerosis of native arteries of extremities with rest pain, bilateral legs: Secondary | ICD-10-CM

## 2021-10-14 ENCOUNTER — Other Ambulatory Visit: Payer: Self-pay | Admitting: Radiology

## 2021-10-15 ENCOUNTER — Other Ambulatory Visit: Payer: Self-pay

## 2021-10-15 ENCOUNTER — Other Ambulatory Visit (HOSPITAL_COMMUNITY): Payer: Self-pay | Admitting: Interventional Radiology

## 2021-10-15 ENCOUNTER — Ambulatory Visit (HOSPITAL_COMMUNITY)
Admission: RE | Admit: 2021-10-15 | Discharge: 2021-10-15 | Disposition: A | Discharge status: Home / Self Care | Payer: Medicare HMO | Source: Ambulatory Visit | Attending: Interventional Radiology | Admitting: Interventional Radiology

## 2021-10-15 DIAGNOSIS — E1151 Type 2 diabetes mellitus with diabetic peripheral angiopathy without gangrene: Secondary | ICD-10-CM | POA: Diagnosis not present

## 2021-10-15 DIAGNOSIS — I70245 Atherosclerosis of native arteries of left leg with ulceration of other part of foot: Secondary | ICD-10-CM | POA: Diagnosis not present

## 2021-10-15 DIAGNOSIS — L97529 Non-pressure chronic ulcer of other part of left foot with unspecified severity: Secondary | ICD-10-CM | POA: Diagnosis not present

## 2021-10-15 DIAGNOSIS — Z794 Long term (current) use of insulin: Secondary | ICD-10-CM | POA: Diagnosis not present

## 2021-10-15 DIAGNOSIS — I70212 Atherosclerosis of native arteries of extremities with intermittent claudication, left leg: Secondary | ICD-10-CM | POA: Diagnosis not present

## 2021-10-15 DIAGNOSIS — Z7984 Long term (current) use of oral hypoglycemic drugs: Secondary | ICD-10-CM | POA: Diagnosis not present

## 2021-10-15 DIAGNOSIS — E11621 Type 2 diabetes mellitus with foot ulcer: Secondary | ICD-10-CM | POA: Insufficient documentation

## 2021-10-15 DIAGNOSIS — Z79899 Other long term (current) drug therapy: Secondary | ICD-10-CM | POA: Diagnosis not present

## 2021-10-15 DIAGNOSIS — I10 Essential (primary) hypertension: Secondary | ICD-10-CM | POA: Diagnosis not present

## 2021-10-15 LAB — GLUCOSE, CAPILLARY: Glucose-Capillary: 102 mg/dL — ABNORMAL HIGH (ref 70–99)

## 2021-10-15 LAB — BASIC METABOLIC PANEL
Anion gap: 9 (ref 5–15)
BUN: 25 mg/dL — ABNORMAL HIGH (ref 8–23)
CO2: 26 mmol/L (ref 22–32)
Calcium: 9.2 mg/dL (ref 8.9–10.3)
Chloride: 105 mmol/L (ref 98–111)
Creatinine, Ser: 1.13 mg/dL — ABNORMAL HIGH (ref 0.44–1.00)
GFR, Estimated: 51 mL/min — ABNORMAL LOW (ref >60)
Glucose, Bld: 122 mg/dL — ABNORMAL HIGH (ref 70–99)
Potassium: 5 mmol/L (ref 3.5–5.1)
Sodium: 140 mmol/L (ref 135–145)

## 2021-10-15 LAB — CBC
HCT: 35.9 % — ABNORMAL LOW (ref 36.0–46.0)
Hemoglobin: 11.6 g/dL — ABNORMAL LOW (ref 12.0–15.0)
MCH: 28.3 pg (ref 26.0–34.0)
MCHC: 32.3 g/dL (ref 30.0–36.0)
MCV: 87.6 fL (ref 80.0–100.0)
Platelets: 181 10*3/uL (ref 150–400)
RBC: 4.1 MIL/uL (ref 3.87–5.11)
RDW: 13.6 % (ref 11.5–15.5)
WBC: 7.4 10*3/uL (ref 4.0–10.5)
nRBC: 0 % (ref 0.0–0.2)

## 2021-10-15 LAB — PROTIME-INR
INR: 1.1 (ref 0.8–1.2)
Prothrombin Time: 14.1 seconds (ref 11.4–15.2)

## 2021-10-15 MED ORDER — ASPIRIN 325 MG PO TABS
ORAL_TABLET | ORAL | Status: AC
  Filled 2021-10-15: qty 2

## 2021-10-15 MED ORDER — CLOPIDOGREL BISULFATE 300 MG PO TABS
ORAL_TABLET | ORAL | Status: AC
  Filled 2021-10-15: qty 1

## 2021-10-15 MED ORDER — FENTANYL CITRATE (PF) 100 MCG/2ML IJ SOLN
INTRAMUSCULAR | Status: AC | PRN
  Administered 2021-10-15 (×4): 25 ug via INTRAVENOUS
  Administered 2021-10-15: 50 ug via INTRAVENOUS

## 2021-10-15 MED ORDER — IODIXANOL 320 MG/ML IV SOLN
100.0000 mL | Freq: Once | INTRAVENOUS | Status: AC | PRN
  Administered 2021-10-15: 40 mL via INTRAVENOUS

## 2021-10-15 MED ORDER — MIDAZOLAM HCL 2 MG/2ML IJ SOLN
INTRAMUSCULAR | Status: AC | PRN
  Administered 2021-10-15 (×4): .5 mg via INTRAVENOUS
  Administered 2021-10-15: 1 mg via INTRAVENOUS
  Administered 2021-10-15: .5 mg via INTRAVENOUS

## 2021-10-15 MED ORDER — CLOPIDOGREL BISULFATE 75 MG PO TABS
ORAL_TABLET | ORAL | Status: AC | PRN
  Administered 2021-10-15: 300 mg via ORAL

## 2021-10-15 MED ORDER — CLOPIDOGREL BISULFATE 75 MG PO TABS: 75.0000 mg | ORAL_TABLET | Freq: Every day | ORAL | 0 refills | Status: AC

## 2021-10-15 MED ORDER — HEPARIN SODIUM (PORCINE) 1000 UNIT/ML IJ SOLN
INTRAMUSCULAR | Status: AC | PRN
  Administered 2021-10-15: 9000 [IU] via INTRAVENOUS

## 2021-10-15 MED ORDER — MIDAZOLAM HCL 2 MG/2ML IJ SOLN
INTRAMUSCULAR | Status: AC
Start: 2021-10-15 — End: 2021-10-15
  Filled 2021-10-15: qty 4

## 2021-10-15 MED ORDER — IODIXANOL 320 MG/ML IV SOLN
100.0000 mL | Freq: Once | INTRAVENOUS | Status: AC | PRN
  Administered 2021-10-15: 16 mL via INTRA_ARTERIAL

## 2021-10-15 MED ORDER — LIDOCAINE HCL 1 % IJ SOLN
INTRAMUSCULAR | Status: AC | PRN
  Administered 2021-10-15: 10 mL via INTRADERMAL

## 2021-10-15 MED ORDER — SODIUM CHLORIDE 0.9 % IV SOLN: INTRAVENOUS | Status: DC

## 2021-10-15 MED ORDER — LIDOCAINE HCL 1 % IJ SOLN
INTRAMUSCULAR | Status: AC
Start: 2021-10-15 — End: 2021-10-15
  Filled 2021-10-15: qty 20

## 2021-10-15 MED ORDER — ASPIRIN 325 MG PO TABS
ORAL_TABLET | ORAL | Status: AC | PRN
  Administered 2021-10-15: 650 mg via ORAL

## 2021-10-15 MED ORDER — HEPARIN SODIUM (PORCINE) 1000 UNIT/ML IJ SOLN
INTRAMUSCULAR | Status: AC
  Filled 2021-10-15: qty 10

## 2021-10-15 MED ORDER — FENTANYL CITRATE (PF) 100 MCG/2ML IJ SOLN
INTRAMUSCULAR | Status: AC
  Filled 2021-10-15: qty 4

## 2021-10-15 NOTE — Sedation Documentation (Signed)
Attempted to call report to SS.  ?

## 2021-10-15 NOTE — Procedures (Signed)
Interventional Radiology Procedure Note ? ?Procedure:  ? ?US guided right CFA access ?LLE angiogram ?Treatment of multifocal stenoses of left AT, for restoration of flow to the left forefoot, laser atherectomy and PTA ?Deployment of CELT for hemostasis ? ?Findings: ?Left iliac and fem-pop system without significant disease ?Left TP trunk occluded. Occlusion of the left peroneal and PT throughout.  ?Left AT with multifocal stenosis, including 2 critical narrowing in the mid segment ?Complete resolution of the stenoses after treatment with improved inline flow to the forefoot ? ?Complications: None ? ?Recommendations:  ?- 1 hr dc home, CELT device for hemostasis ?- right hip straight x 1 hr ?- '81mg'$  ASA daily ?- '75mg'$  plavix daily x 90 days ?- routine wound care, do not submerge for 7 days ?- advance diet ?-  continue wound care ?- follow up with Dr. Earleen Newport in clinic in 4-6 weeks, no imaging ? ?Signed, ? ?Dulcy Fanny. Earleen Newport, DO ? ? ?

## 2021-10-15 NOTE — H&P (Signed)
? ?Chief Complaint: ?Patient was seen in consultation today for poor healing of wounds of bilateral feet ? ?Supervising Physician: Corrie Mckusick ? ?Patient Status: [facility] Out-[facility] ? ?History of Present Illness: ?Catherine Ramsey is a 76 y.o. female with longstanding insulin-dependent diabetes.  She has a healing wound of the right fifth digit, and an active wound involving her left great toe.  She was seen in clinic for consult with Dr. Laurence Ferrari on 10/01/21 due to the peripheral arterial disease.  They decided to schedule a bilateral extremity arteriogram with possible intervention. ? ?She reports feeling well today.  Just finished a round of antibiotics for left great toe.  Reports fall approximately 2 weeks ago, but does not associate this with the abnormalities of her feet.  Reports intermittent pain in tip of left toe, but otherwise denies any pain in her body, open wounds or SOB. ? ? ? ?Past Medical History:  ?Diagnosis Date  ? Achalasia - Type II 05/08/2010  ? Qualifier: Diagnosis of  By: Carlean Purl MD, Dimas Millin   ? Acute kidney injury ([place]) 02/26/2019  ? Candida esophagitis ([place]) 03/26/2014  ? COVID-19 virus detected 02/25/2019  ? Diabetes mellitus   ? type 2  ? Family history of adverse reaction to anesthesia   ? son hard to wake up  ? Hypertension   ? Syncope 02/25/2019  ? Trigger ring finger of right hand 10/07/2016  ? ? ?Past Surgical History:  ?Procedure Laterality Date  ? ABDOMINAL HYSTERECTOMY    ? BREAST BIOPSY    ? left/benign  ? COLONOSCOPY  01/31/2014  ? ESOPHAGEAL MANOMETRY N/A 04/01/2014  ? Procedure: ESOPHAGEAL MANOMETRY (EM);  Surgeon: Gatha Mayer, MD;  Location: [facility];  Service: Endoscopy;  Laterality: N/A;  ? HELLER MYOTOMY N/A 12/07/2016  ? Procedure: LAPAROSCOPIC HELLER MYOTOMY, DOR FUNDOPLICATION, HIATAL HERNIA REPAIR, INTRAOPERATIVE UPPER ENDOSCOPY;  Surgeon: Jackolyn Confer, MD;  Location: [facility];  Service: General;  Laterality: N/A;  ? IR RADIOLOGIST EVAL & MGMT  10/01/2021  ? TOTAL HIP  ARTHROPLASTY    ? right pin then replacement  ? ? ?Allergies: ?Lovastatin ? ?Medications: ?Prior to Admission medications   ?Medication Sig Start Date End Date Taking? Authorizing Provider  ?acetaminophen (TYLENOL) 500 MG tablet Take 1,000 mg by mouth every 6 (six) hours as needed for moderate pain.   Yes [provider]  ?amLODipine (NORVASC) 10 MG tablet Take 10 mg by mouth daily.   Yes [provider]  ?doxycycline (VIBRA-TABS) 100 MG tablet Take 1 tablet (100 mg total) by mouth 2 (two) times daily. 10/01/21  Yes Felipa Furnace, DPM  ?empagliflozin (JARDIANCE) 10 MG TABS tablet Take 10 mg by mouth daily.   Yes [provider]  ?gabapentin (NEURONTIN) 300 MG capsule Take 300 mg by mouth at bedtime.   Yes [provider]  ?ibuprofen (ADVIL) 200 MG tablet Take 400 mg by mouth every 6 (six) hours as needed for moderate pain.   Yes [provider]  ?insulin aspart (NOVOLOG) 100 UNIT/ML FlexPen Inject 9 Units into the skin 3 (three) times daily as needed for high blood sugar.   Yes [provider]  ?KLOR-CON M20 20 MEQ tablet Take 20 mEq by mouth daily. 09/06/17  Yes [provider]  ?LANTUS SOLOSTAR 100 UNIT/ML Solostar Pen Inject 17 Units into the skin daily. 10/24/19  Yes [provider]  ?Liniments (BLUE-EMU SUPER STRENGTH) CREA Apply 1 application. topically 3 (three) times daily.   Yes [provider]  ?losartan-hydrochlorothiazide (HYZAAR) 50-12.5 MG tablet Take 1 tablet by mouth daily.   Yes [provider]  ?omeprazole (PRILOSEC) 20 MG capsule Take 20 mg by mouth daily as needed (acid reflux).   Yes [provider]  ?pravastatin (PRAVACHOL) 40 MG tablet Take 40 mg by mouth at bedtime. 12/02/18  Yes [provider]  ?Alcohol Swabs (B-D SINGLE USE SWABS REGULAR) PADS  12/10/19   [provider]  ?Blood Glucose Monitoring Suppl (TRUE METRIX AIR GLUCOSE METER) w/Device KIT  11/30/19   [provider]  ?CINNAMON PO Take 1 capsule by mouth daily. With chromium    [provider]  ?Insulin Syringe-Needle U-100 31G X 5/16" 0.5 ML MISC Use one syringe twice daily DX:  250.70 09/26/12   [provider]  ?oxyCODONE (ROXICODONE) 5 MG immediate release tablet Take 1 tablet (5 mg total) by mouth every 4 (four) hours as needed for severe pain. ?Patient not taking: Reported on 10/14/2021 09/08/21   Evelina Bucy, DPM  ?TRUE METRIX BLOOD GLUCOSE TEST test strip  11/30/19   [provider]  ?TRUEplus Lancets 33G [place]  11/30/19   [provider]  ?  ? ?Family History  ?Problem Relation Age of Onset  ? Diabetes Mother   ?     died at 28 from Covid-19  ? Hypertension Mother   ? Hyperlipidemia Mother   ? Breast cancer Mother   ? Diabetes Father   ? Hypertension Father   ? Hypertension Sister   ? Hypertension Sister   ? Colon polyps Daughter   ? Colon cancer Neg Hx   ? Esophageal cancer Neg Hx   ? Rectal cancer Neg Hx   ? Stomach cancer Neg Hx   ? ? ?Social History  ? ?Socioeconomic History  ? Marital status: Married  ?  Spouse name: Not on file  ? Number of children: Not on file  ? Years of education: Not on file  ? Highest education level: Not on file  ?Occupational History  ? Not on file  ?Tobacco Use  ? Smoking status: Never  ? Smokeless tobacco: Never  ?Vaping Use  ? Vaping Use: Never used  ?Substance and Sexual Activity  ? Alcohol use: No  ? Drug use: No  ? Sexual activity: Yes  ?Other Topics Concern  ? Not on file  ?Social History Narrative  ? Not on file  ? ?Social Determinants of Health  ? ?Financial Resource Strain: Not on file  ?Food Insecurity: Not on file  ?Transportation Needs: Not on file  ?Physical Activity: Not on file  ?Stress: Not on file  ?Social Connections: Not on file  ? ? ?Review of Systems  ?Constitutional: Negative.   ?HENT: Negative.    ?Eyes: Negative.   ?Respiratory: Negative.    ?Cardiovascular: Negative.   ?Gastrointestinal: Negative.   ?Endocrine: Negative.    ?Genitourinary: Negative.   ?Musculoskeletal: Negative.   ?Skin: Negative.   ?Allergic/Immunologic: Negative.   ?Neurological: Negative.   ?Hematological: Negative.   ?Psychiatric/Behavioral: Negative.    ? ?Vital Signs: ?BP (!) 178/66   Pulse 65   Temp 98.6 ?F (37 ?C) (Oral)   Resp 16   Ht _0  (1.6 m)   Wt 180 lb (81.6 kg)   SpO2 100%   BMI 31.89 kg/m?  ? ?Physical Exam ?Constitutional:   ?   General: She is not in acute distress. ?   Appearance: She is not ill-appearing.  ?HENT:  ?  Mouth/Throat:  ?   Mouth: Mucous membranes are moist.  ?   Pharynx: Oropharynx is clear.  ?Eyes:  ?   Extraocular Movements: Extraocular movements intact.  ?Cardiovascular:  ?   Rate and Rhythm: Normal rate and regular rhythm.  ?Pulmonary:  ?   Effort: Pulmonary effort is normal.  ?Abdominal:  ?   General: Abdomen is flat.  ?   Palpations: Abdomen is soft.  ?Musculoskeletal:  ?   Right foot: Decreased capillary refill. Abnormal pulse.  ?   Left foot: Decreased capillary refill. Tenderness present. Abnormal pulse.  ?   Comments: Left great toe -tender about the damaged nail bed, no purulence ?Right 5th toe-damaged nail bed without nail  ?Skin: ?   General: Skin is dry.  ?   Capillary Refill: Capillary refill takes more than 3 seconds.  ?Neurological:  ?   General: No focal deficit present.  ?   Mental Status: She is alert and oriented to person, place, and time.  ?Psychiatric:     ?   Mood and Affect: Mood normal.     ?   Behavior: Behavior normal.  ? ? ?Imaging: ?CT ANGIO AO+BIFEM W & OR WO CONTRAST ? ?Result Date: 10/07/2021 ?CLINICAL DATA:  Critical limb ischemia of both lower extremities. Slowly healing right foot wound. EXAM: CT ANGIOGRAPHY OF ABDOMINAL AORTA WITH ILIOFEMORAL RUNOFF TECHNIQUE: Multidetector CT imaging of the abdomen, pelvis and lower extremities was performed using the standard protocol during bolus administration of intravenous contrast. Multiplanar CT image reconstructions and MIPs were obtained to  evaluate the vascular anatomy. RADIATION DOSE REDUCTION: This exam was performed according to the departmental dose-optimization program which includes automated exposure control, adjustment of the mA and/or kV accord

## 2021-10-20 ENCOUNTER — Other Ambulatory Visit (HOSPITAL_COMMUNITY): Payer: Self-pay | Admitting: Interventional Radiology

## 2021-10-20 ENCOUNTER — Encounter (HOSPITAL_COMMUNITY): Payer: Self-pay

## 2021-10-20 ENCOUNTER — Other Ambulatory Visit: Payer: Self-pay | Admitting: Interventional Radiology

## 2021-10-20 DIAGNOSIS — I70222 Atherosclerosis of native arteries of extremities with rest pain, left leg: Secondary | ICD-10-CM

## 2021-10-20 DIAGNOSIS — I70223 Atherosclerosis of native arteries of extremities with rest pain, bilateral legs: Secondary | ICD-10-CM

## 2021-10-20 HISTORY — PX: IR ANGIOGRAM PELVIS SELECTIVE OR SUPRASELECTIVE: IMG661

## 2021-10-27 ENCOUNTER — Telehealth: Payer: Self-pay | Admitting: Podiatry

## 2021-10-27 NOTE — Telephone Encounter (Signed)
Patient called she thinks her toe is still infected - she also thinks that its spreading, because its turning her other toe black now.

## 2021-10-28 ENCOUNTER — Other Ambulatory Visit: Payer: Self-pay

## 2021-10-28 ENCOUNTER — Ambulatory Visit (INDEPENDENT_AMBULATORY_CARE_PROVIDER_SITE_OTHER): Payer: Medicare HMO | Admitting: Podiatry

## 2021-10-28 ENCOUNTER — Encounter: Payer: Self-pay | Admitting: Podiatry

## 2021-10-28 DIAGNOSIS — M79674 Pain in right toe(s): Secondary | ICD-10-CM | POA: Diagnosis not present

## 2021-10-28 DIAGNOSIS — L97514 Non-pressure chronic ulcer of other part of right foot with necrosis of bone: Secondary | ICD-10-CM | POA: Diagnosis not present

## 2021-10-28 DIAGNOSIS — M79675 Pain in left toe(s): Secondary | ICD-10-CM | POA: Diagnosis not present

## 2021-10-28 DIAGNOSIS — I739 Peripheral vascular disease, unspecified: Secondary | ICD-10-CM | POA: Diagnosis not present

## 2021-10-28 DIAGNOSIS — B351 Tinea unguium: Secondary | ICD-10-CM | POA: Diagnosis not present

## 2021-10-28 NOTE — Progress Notes (Signed)
Subjective:  ? ?Patient ID: Catherine Ramsey, female   DOB: 76 y.o.   MRN: [id number]  ? ?HPI ?Patient presents stating her toenails have really been bothering her and she states that the toe where she had the ulceration appears to be improving and has had recent vascular procedure to try to increase blood flow that was done and she is due to be reevaluated the end of April ? ? ?ROS ? ? ?   ?Objective:  ?Physical Exam  ?Neuro vascular status unchanged with diminished pulses noted warmth to the feet crusted fifth digit right with small breakdown of tissue but localized with no erythema edema or pain with thick yellow brittle nails which become sore and have been more sore since her vascular structure was improved ? ?   ?Assessment:  ?Probability that this was a good procedure that she has had done with thick yellow brittle nailbeds 1-5 both feet that are painful and ulceration right which appears to be healing  ? ?   ?Plan:  ?H&P reviewed condition and I explained that after having probable increase in her blood flow sometimes pain will increase for a while but I do not see breakdown of tissue right fifth toe she will continue with the same treatment she has been doing for this.  Continue open toed shoes and I did debride nailbeds 1-5 today with no iatrogenic bleeding ?   ? ? ?

## 2021-11-25 ENCOUNTER — Encounter: Payer: Self-pay | Admitting: *Deleted

## 2021-11-25 ENCOUNTER — Ambulatory Visit
Admission: RE | Admit: 2021-11-25 | Discharge: 2021-11-25 | Disposition: A | Discharge status: Home / Self Care | Payer: Medicare HMO | Source: Ambulatory Visit | Attending: Interventional Radiology | Admitting: Interventional Radiology

## 2021-11-25 DIAGNOSIS — L97529 Non-pressure chronic ulcer of other part of left foot with unspecified severity: Secondary | ICD-10-CM | POA: Diagnosis not present

## 2021-11-25 DIAGNOSIS — I70222 Atherosclerosis of native arteries of extremities with rest pain, left leg: Secondary | ICD-10-CM

## 2021-11-25 DIAGNOSIS — E11621 Type 2 diabetes mellitus with foot ulcer: Secondary | ICD-10-CM | POA: Diagnosis not present

## 2021-11-25 DIAGNOSIS — E1151 Type 2 diabetes mellitus with diabetic peripheral angiopathy without gangrene: Secondary | ICD-10-CM | POA: Diagnosis not present

## 2021-11-25 HISTORY — PX: IR RADIOLOGIST EVAL & MGMT: IMG5224

## 2021-11-25 NOTE — Progress Notes (Addendum)
? ? ?Chief Complaint: ?LLE wound, PAD/CLI ? ?Referring Physician(s): ?Patel,Kevin P ?  ?History of Present Illness: ?Catherine Ramsey is a 76 y.o. female presenting as a scheduled post-op visit to [facility].  ? ?She is here today by herself for our interview.  ? ?She is SP LLE angiogram and treatment of tibial arterial disease for left fifth and great toe wounds/pain.  We treated her 10/15/21 with laser atherectomy and PTA of multifocal disease of the left AT.  ? ?She tells me that since treatment she not only has had complete resolution of her pain, but she perceives some improved sensation in the plantar aspect of her foot.  She has had interval healing of the fifth and first toe wounds.  No new wounds.  ?  ?She continues on maximal medical therapy.  ?She is taking ASA/Plavix, pravachol, and losartan.  ? ?Past Medical History:  ?Diagnosis Date  ? Achalasia - Type II 05/08/2010  ? Qualifier: Diagnosis of  By: Carlean Purl MD, Dimas Millin   ? Acute kidney injury ([place]) 02/26/2019  ? Candida esophagitis ([place]) 03/26/2014  ? COVID-19 virus detected 02/25/2019  ? Diabetes mellitus   ? type 2  ? Family history of adverse reaction to anesthesia   ? son hard to wake up  ? Hypertension   ? Syncope 02/25/2019  ? Trigger ring finger of right hand 10/07/2016  ? ? ?Past Surgical History:  ?Procedure Laterality Date  ? ABDOMINAL HYSTERECTOMY    ? BREAST BIOPSY    ? left/benign  ? COLONOSCOPY  01/31/2014  ? ESOPHAGEAL MANOMETRY N/A 04/01/2014  ? Procedure: ESOPHAGEAL MANOMETRY (EM);  Surgeon: Gatha Mayer, MD;  Location: [facility];  Service: Endoscopy;  Laterality: N/A;  ? HELLER MYOTOMY N/A 12/07/2016  ? Procedure: LAPAROSCOPIC HELLER MYOTOMY, DOR FUNDOPLICATION, HIATAL HERNIA REPAIR, INTRAOPERATIVE UPPER ENDOSCOPY;  Surgeon: Jackolyn Confer, MD;  Location: [facility];  Service: General;  Laterality: N/A;  ? IR ANGIOGRAM PELVIS SELECTIVE OR SUPRASELECTIVE  10/20/2021  ? IR RADIOLOGIST EVAL & MGMT  10/01/2021  ? IR TIB-PERO ART ATHEREC INC PTA MOD  SED  10/15/2021  ? IR US GUIDE VASC ACCESS RIGHT  10/15/2021  ? TOTAL HIP ARTHROPLASTY    ? right pin then replacement  ? ? ?Allergies: ?Lovastatin ? ?Medications: ?Prior to Admission medications   ?Medication Sig Start Date End Date Taking? Authorizing Provider  ?acetaminophen (TYLENOL) 500 MG tablet Take 1,000 mg by mouth every 6 (six) hours as needed for moderate pain.    [provider]  ?Alcohol Swabs (B-D SINGLE USE SWABS REGULAR) PADS  12/10/19   [provider]  ?amLODipine (NORVASC) 10 MG tablet Take 10 mg by mouth daily.    [provider]  ?Blood Glucose Monitoring Suppl (TRUE METRIX AIR GLUCOSE METER) w/Device KIT  11/30/19   [provider]  ?CINNAMON PO Take 1 capsule by mouth daily. With chromium    [provider]  ?clopidogrel (PLAVIX) 75 MG tablet Take 1 tablet (75 mg total) by mouth daily. 10/15/21 01/13/22  Theresa Duty, NP  ?doxycycline (VIBRA-TABS) 100 MG tablet Take 1 tablet (100 mg total) by mouth 2 (two) times daily. 10/01/21   Felipa Furnace, DPM  ?empagliflozin (JARDIANCE) 10 MG TABS tablet Take 10 mg by mouth daily.    [provider]  ?gabapentin (NEURONTIN) 300 MG capsule Take 300 mg by mouth at bedtime.    [provider]  ?ibuprofen (ADVIL) 200 MG tablet Take 400 mg by mouth every  6 (six) hours as needed for moderate pain.    [provider]  ?insulin aspart (NOVOLOG) 100 UNIT/ML FlexPen Inject 9 Units into the skin 3 (three) times daily as needed for high blood sugar.    [provider]  ?Insulin Syringe-Needle U-100 31G X 5/16" 0.5 ML MISC Use one syringe twice daily DX:  250.70 09/26/12   [provider]  ?KLOR-CON M20 20 MEQ tablet Take 20 mEq by mouth daily. 09/06/17   [provider]  ?LANTUS SOLOSTAR 100 UNIT/ML Solostar Pen Inject 17 Units into the skin daily. 10/24/19   [provider]  ?Liniments (BLUE-EMU SUPER STRENGTH) CREA Apply 1 application. topically 3 (three) times  daily.    [provider]  ?losartan-hydrochlorothiazide (HYZAAR) 50-12.5 MG tablet Take 1 tablet by mouth daily.    [provider]  ?omeprazole (PRILOSEC) 20 MG capsule Take 20 mg by mouth daily as needed (acid reflux).    [provider]  ?oxyCODONE (ROXICODONE) 5 MG immediate release tablet Take 1 tablet (5 mg total) by mouth every 4 (four) hours as needed for severe pain. ?Patient not taking: Reported on 10/14/2021 09/08/21   Evelina Bucy, DPM  ?pravastatin (PRAVACHOL) 40 MG tablet Take 40 mg by mouth at bedtime. 12/02/18   [provider]  ?TRUE METRIX BLOOD GLUCOSE TEST test strip  11/30/19   [provider]  ?TRUEplus Lancets 33G [place]  11/30/19   [provider]  ?  ? ?Family History  ?Problem Relation Age of Onset  ? Diabetes Mother   ?     died at 62 from Covid-19  ? Hypertension Mother   ? Hyperlipidemia Mother   ? Breast cancer Mother   ? Diabetes Father   ? Hypertension Father   ? Hypertension Sister   ? Hypertension Sister   ? Colon polyps Daughter   ? Colon cancer Neg Hx   ? Esophageal cancer Neg Hx   ? Rectal cancer Neg Hx   ? Stomach cancer Neg Hx   ? ? ?Social History  ? ?Socioeconomic History  ? Marital status: Married  ?  Spouse name: Not on file  ? Number of children: Not on file  ? Years of education: Not on file  ? Highest education level: Not on file  ?Occupational History  ? Not on file  ?Tobacco Use  ? Smoking status: Never  ? Smokeless tobacco: Never  ?Vaping Use  ? Vaping Use: Never used  ?Substance and Sexual Activity  ? Alcohol use: No  ? Drug use: No  ? Sexual activity: Yes  ?Other Topics Concern  ? Not on file  ?Social History Narrative  ? Not on file  ? ?Social Determinants of Health  ? ?Financial Resource Strain: Not on file  ?Food Insecurity: Not on file  ?Transportation Needs: Not on file  ?Physical Activity: Not on file  ?Stress: Not on file  ?Social Connections: Not on file  ? ?  ? ?Review of Systems: A 12 point ROS discussed  and pertinent positives are indicated in the HPI above.  All other systems are negative. ? ?Review of Systems ? ?Vital Signs: ?BP (!) 156/69 (BP Location: Left Arm)   Pulse 65   SpO2 99%  ? ?Physical Exam ?General: 76 yo female appearing stated age.  Well-developed, well-nourished.  No distress. ?HEENT: Atraumatic, normocephalic.  Conjugate gaze, extra-ocular motor intact. No scleral icterus or scleral injection. No lesions on external ears, nose, lips, or gums.  Oral mucosa  moist, pink.  ?Neck: Symmetric with no goiter enlargement.  ?Chest/Lungs:  Symmetric chest with inspiration/expiration.  No labored breathing.    ?Heart:  No JVD appreciated.  ?Abdomen:  Soft, NT/ND  ?Genito-urinary: Deferred ?Neurologic: Alert & Oriented to person, place, and time.   Normal affect and insight.  Appropriate questions.  Moving all 4 extremities with gross sensory intact.  ?Pulse Exam:  + DP and PT doppler on the right.  + DP left.  The left PT remains non-doppler.  No swelling, pain or palpable lump at the right CFA access site ?Extremities: No new wound.  No intertriginous wound. Healing small wounds on the great toe distally, the second toe PIP, and the fifth digit.  ? ?  ? ?Imaging: ?No results found. ? ?Labs: ? ?CBC: ?Recent Labs  ?  12/31/20 ?1729 08/21/21 ?1357 10/15/21 ?0740  ?WBC 11.4* 9.5 7.4  ?HGB 10.9* 11.6* 11.6*  ?HCT 35.1* 35.4 35.9*  ?PLT 292 235 181  ? ? ?COAGS: ?Recent Labs  ?  10/15/21 ?0740  ?INR 1.1  ? ? ?BMP: ?Recent Labs  ?  12/31/20 ?1729 10/05/21 ?1734 10/15/21 ?0740  ?NA 136  --  140  ?K 3.8  --  5.0  ?CL 99  --  105  ?CO2 25  --  26  ?GLUCOSE 194*  --  122*  ?BUN 24*  --  25*  ?CALCIUM 9.3  --  9.2  ?CREATININE 1.30* 1.30* 1.13*  ?GFRNONAA 43*  --  51*  ? ? ?LIVER FUNCTION TESTS: ?No results for input(s): BILITOT, AST, ALT, ALKPHOS, PROT, ALBUMIN in the last 8760 hours. ? ?TUMOR MARKERS: ?No results for input(s): AFPTM, CEA, CA199, CHROMGRNA in the last 8760 hours. ? ?Assessment and Plan: ?Ms Maduro  is 76 year old female with PAD/CLI left lower extremity, SP revascularization of left AT.  ? ?She has had near complete healing of the wounds on left foot, with remission of pain.  ? ?Regarding medical manag

## 2021-12-31 DIAGNOSIS — H2513 Age-related nuclear cataract, bilateral: Secondary | ICD-10-CM | POA: Diagnosis not present

## 2021-12-31 DIAGNOSIS — H2512 Age-related nuclear cataract, left eye: Secondary | ICD-10-CM | POA: Diagnosis not present

## 2022-01-01 DIAGNOSIS — H2511 Age-related nuclear cataract, right eye: Secondary | ICD-10-CM | POA: Diagnosis not present

## 2022-01-07 DIAGNOSIS — E785 Hyperlipidemia, unspecified: Secondary | ICD-10-CM | POA: Diagnosis not present

## 2022-01-07 DIAGNOSIS — N1831 Chronic kidney disease, stage 3a: Secondary | ICD-10-CM | POA: Diagnosis not present

## 2022-01-07 DIAGNOSIS — E1165 Type 2 diabetes mellitus with hyperglycemia: Secondary | ICD-10-CM | POA: Diagnosis not present

## 2022-01-07 DIAGNOSIS — I129 Hypertensive chronic kidney disease with stage 1 through stage 4 chronic kidney disease, or unspecified chronic kidney disease: Secondary | ICD-10-CM | POA: Diagnosis not present

## 2022-01-07 DIAGNOSIS — Z794 Long term (current) use of insulin: Secondary | ICD-10-CM | POA: Diagnosis not present

## 2022-01-07 DIAGNOSIS — E669 Obesity, unspecified: Secondary | ICD-10-CM | POA: Diagnosis not present

## 2022-01-18 ENCOUNTER — Other Ambulatory Visit: Payer: Self-pay | Admitting: Internal Medicine

## 2022-01-18 DIAGNOSIS — Z1231 Encounter for screening mammogram for malignant neoplasm of breast: Secondary | ICD-10-CM

## 2022-01-21 DIAGNOSIS — H2511 Age-related nuclear cataract, right eye: Secondary | ICD-10-CM | POA: Diagnosis not present

## 2022-01-21 DIAGNOSIS — H2513 Age-related nuclear cataract, bilateral: Secondary | ICD-10-CM | POA: Diagnosis not present

## 2022-02-03 ENCOUNTER — Ambulatory Visit: Payer: Medicare HMO | Admitting: Podiatry

## 2022-02-03 ENCOUNTER — Encounter: Payer: Self-pay | Admitting: Podiatry

## 2022-02-03 ENCOUNTER — Ambulatory Visit (INDEPENDENT_AMBULATORY_CARE_PROVIDER_SITE_OTHER): Payer: Medicare HMO | Admitting: Podiatry

## 2022-02-03 DIAGNOSIS — I999 Unspecified disorder of circulatory system: Secondary | ICD-10-CM

## 2022-02-03 DIAGNOSIS — B351 Tinea unguium: Secondary | ICD-10-CM

## 2022-02-03 DIAGNOSIS — E1142 Type 2 diabetes mellitus with diabetic polyneuropathy: Secondary | ICD-10-CM | POA: Diagnosis not present

## 2022-02-03 DIAGNOSIS — I739 Peripheral vascular disease, unspecified: Secondary | ICD-10-CM | POA: Diagnosis not present

## 2022-02-03 DIAGNOSIS — M79674 Pain in right toe(s): Secondary | ICD-10-CM

## 2022-02-03 DIAGNOSIS — M79675 Pain in left toe(s): Secondary | ICD-10-CM

## 2022-02-03 NOTE — Progress Notes (Signed)
This patient returns to my office for at risk foot care.  This patient requires this care by a professional since this patient will be at risk due to having diabetes and PAD and vascular disease. This patient is unable to cut nails herself since the patient cannot reach her nails.These nails are painful walking and wearing shoes.  This patient presents for at risk foot care today.  General Appearance  Alert, conversant and in no acute stress.  Vascular  Dorsalis pedis and posterior tibial  pulses are  not palpable  bilaterally.  Capillary return is within normal limits  bilaterally. Cold feet.  bilaterally.  Absent hair.    Neurologic  Senn-Weinstein monofilament wire test within normal limits  bilaterally. Muscle power within normal limits bilaterally.  Nails Thick disfigured discolored nails with subungual debris  from hallux to fifth toes bilaterally. No evidence of bacterial infection or drainage bilaterally. Healing nail bed fifth toe right foot.  No infection noted.  Orthopedic  No limitations of motion  feet .  No crepitus or effusions noted.  No bony pathology or digital deformities noted.  Skin  normotropic skin with no porokeratosis noted bilaterally.  No signs of infections or ulcers noted.     Onychomycosis  Pain in right toes  Pain in left toes  Consent was obtained for treatment procedures.   Mechanical debridement of nails 1-5  bilaterally performed with a nail nipper.  Filed with dremel without incident. Told patient to peroxide her fifth toe right since the nailbed/toe is healing   Return office visit   3 months                   Told patient to return for periodic foot care and evaluation due to potential at risk complications.   Gardiner Barefoot DPM

## 2022-02-26 ENCOUNTER — Encounter: Payer: Self-pay | Admitting: Internal Medicine

## 2022-04-15 DIAGNOSIS — E785 Hyperlipidemia, unspecified: Secondary | ICD-10-CM | POA: Diagnosis not present

## 2022-04-15 DIAGNOSIS — I129 Hypertensive chronic kidney disease with stage 1 through stage 4 chronic kidney disease, or unspecified chronic kidney disease: Secondary | ICD-10-CM | POA: Diagnosis not present

## 2022-04-15 DIAGNOSIS — N1831 Chronic kidney disease, stage 3a: Secondary | ICD-10-CM | POA: Diagnosis not present

## 2022-04-15 DIAGNOSIS — E1165 Type 2 diabetes mellitus with hyperglycemia: Secondary | ICD-10-CM | POA: Diagnosis not present

## 2022-04-15 DIAGNOSIS — Z794 Long term (current) use of insulin: Secondary | ICD-10-CM | POA: Diagnosis not present

## 2022-05-07 ENCOUNTER — Ambulatory Visit: Payer: Medicare HMO | Admitting: Podiatry

## 2022-05-20 ENCOUNTER — Ambulatory Visit: Payer: Medicare HMO | Admitting: Internal Medicine

## 2022-05-20 ENCOUNTER — Encounter: Payer: Self-pay | Admitting: Internal Medicine

## 2022-05-20 VITALS — BP 104/60 | HR 64 | Ht 67.0 in | Wt 185.1 lb

## 2022-05-20 DIAGNOSIS — Z86010 Personal history of colonic polyps: Secondary | ICD-10-CM

## 2022-05-20 DIAGNOSIS — K220 Achalasia of cardia: Secondary | ICD-10-CM | POA: Diagnosis not present

## 2022-05-20 DIAGNOSIS — R1319 Other dysphagia: Secondary | ICD-10-CM | POA: Diagnosis not present

## 2022-05-20 NOTE — Progress Notes (Addendum)
Catherine Ramsey 76 y.o. 16-Sep-1945 [id number]  Assessment & Plan:   Encounter Diagnoses  Name Primary?   Esophageal dysphagia Yes   Achalasia - Type II    Personal history of colonic polyps - adenomas, TV adenomas, serrated polyps    Differential for dysphagia in addition to her achalasia would be peptic stricture status post Heller myotomy and also there is some increased cancer risk in the setting of achalasia so that is in the differential as well.  Plan for EGD to evaluate and esophageal dilation if appropriate.  We will also schedule surveillance colonoscopy because of history of colon polyps. Would apply abdominal binder given difficult looping last colonoscopy The risks and benefits as well as alternatives of endoscopic procedure(s) have been discussed and reviewed. All questions answered. The patient agrees to proceed.   CC: [name], MD  Subjective:   Chief Complaint: Dysphagia  HPI 76 year old African-American woman with a history of precancerous colon polyps, achalasia type II status post Heller myotomy 2015, diabetes mellitus type 2 here for follow-up complaining of intermittent mild solid food dysphagia.  Particularly with meats.  She is currently taking omeprazole 20 mg daily as needed.  No frequent heartburn symptoms reported.  History of colon polyps, adenomas serrated adenomas/polyps and tubulovillous adenomas with last colonoscopy showing 7 diminutive adenomas in September 2020 and due for recall.  Wt Readings from Last 3 Encounters:  05/20/22 185 lb 2 oz (84 kg)  10/15/21 180 lb (81.6 kg)  05/05/21 185 lb (83.9 kg)    Allergies  Allergen Reactions   Lovastatin Rash   Current Meds  Medication Sig   acetaminophen (TYLENOL) 500 MG tablet Take 1,000 mg by mouth every 6 (six) hours as needed for moderate pain.   Alcohol Swabs (B-D SINGLE USE SWABS REGULAR) PADS    amLODipine (NORVASC) 10 MG tablet Take 10 mg by mouth daily.   Blood Glucose  Monitoring Suppl (TRUE METRIX AIR GLUCOSE METER) w/Device KIT    CINNAMON PO Take 1 capsule by mouth daily. With chromium   empagliflozin (JARDIANCE) 10 MG TABS tablet Take 10 mg by mouth daily.   gabapentin (NEURONTIN) 300 MG capsule Take 300 mg by mouth at bedtime.   ibuprofen (ADVIL) 200 MG tablet Take 400 mg by mouth every 6 (six) hours as needed for moderate pain.   insulin aspart (NOVOLOG) 100 UNIT/ML FlexPen Inject 9 Units into the skin 3 (three) times daily as needed for high blood sugar.   Insulin Syringe-Needle U-100 31G X 5/16" 0.5 ML MISC Use one syringe twice daily DX:  250.70   KLOR-CON M20 20 MEQ tablet Take 20 mEq by mouth daily.   LANTUS SOLOSTAR 100 UNIT/ML Solostar Pen Inject 17 Units into the skin daily.   Liniments (BLUE-EMU SUPER STRENGTH) CREA Apply 1 application. topically 3 (three) times daily.   losartan-hydrochlorothiazide (HYZAAR) 50-12.5 MG tablet Take 1 tablet by mouth daily.   omeprazole (PRILOSEC) 20 MG capsule Take 20 mg by mouth daily as needed (acid reflux).   pravastatin (PRAVACHOL) 40 MG tablet Take 40 mg by mouth at bedtime.   TRUE METRIX BLOOD GLUCOSE TEST test strip    TRUEplus Lancets 33G MISC    Past Medical History:  Diagnosis Date   Achalasia - Type II 05/08/2010   Qualifier: Diagnosis of  By: Carlean Purl MD, Tonna Boehringer E    Acute kidney injury ([place]) 02/26/2019   Candida esophagitis ([place]) 03/26/2014   COVID-19 virus detected 02/25/2019   Diabetes mellitus  type 2   Family history of adverse reaction to anesthesia    son hard to wake up   Hypertension    Syncope 02/25/2019   Trigger ring finger of right hand 10/07/2016   Past Surgical History:  Procedure Laterality Date   ABDOMINAL HYSTERECTOMY     BREAST BIOPSY     left/benign   COLONOSCOPY  01/31/2014   ESOPHAGEAL MANOMETRY N/A 04/01/2014   Procedure: ESOPHAGEAL MANOMETRY (EM);  Surgeon: Gatha Mayer, MD;  Location: [facility];  Service: Endoscopy;  Laterality: N/A;   HELLER MYOTOMY N/A  12/07/2016   Procedure: LAPAROSCOPIC HELLER MYOTOMY, DOR FUNDOPLICATION, HIATAL HERNIA REPAIR, INTRAOPERATIVE UPPER ENDOSCOPY;  Surgeon: Jackolyn Confer, MD;  Location: [facility];  Service: General;  Laterality: N/A;   IR ANGIOGRAM PELVIS SELECTIVE OR SUPRASELECTIVE  10/20/2021   IR RADIOLOGIST EVAL & MGMT  10/01/2021   IR RADIOLOGIST EVAL & MGMT  11/25/2021   IR TIB-PERO ART ATHEREC INC PTA MOD SED  10/15/2021   IR US GUIDE VASC ACCESS RIGHT  10/15/2021   TOTAL HIP ARTHROPLASTY     right pin then replacement   Social History   Social History Narrative   Patient is married and retired   No alcohol tobacco or drug use   family history includes Breast cancer in her mother; Colon polyps in her daughter; Diabetes in her father and mother; Hyperlipidemia in her mother; Hypertension in her father, mother, sister, and sister.   Review of Systems As per HPI  Objective:   Physical Exam '@BP'  104/60   Pulse 64   Ht '5\' 7"'  (1.702 m)   Wt 185 lb 2 oz (84 kg)   BMI 28.99 kg/m @  General:  NAD Eyes:   anicteric Lungs:  clear Heart::  S1S2 no rubs, murmurs or gallops Abdomen:  soft, mildly obese with a very soft umbilical hernia.  She has some mild right and left lower quadrant tenderness to deep palpation without organomegaly or mass or rebound or guarding.    Data Reviewed:  Prior endoscopic evaluations,

## 2022-05-20 NOTE — Patient Instructions (Signed)
You have been scheduled for an endoscopy and colonoscopy. Please follow the written instructions given to you at your visit today. Please pick up your prep supplies at the pharmacy within the next 1-3 days. If you use inhalers (even only as needed), please bring them with you on the day of your procedure.  I appreciate the opportunity to care for you. Carl Gessner, MD, FACG 

## 2022-05-31 DIAGNOSIS — M48062 Spinal stenosis, lumbar region with neurogenic claudication: Secondary | ICD-10-CM | POA: Diagnosis not present

## 2022-05-31 DIAGNOSIS — N1831 Chronic kidney disease, stage 3a: Secondary | ICD-10-CM | POA: Diagnosis not present

## 2022-05-31 DIAGNOSIS — B356 Tinea cruris: Secondary | ICD-10-CM | POA: Diagnosis not present

## 2022-05-31 DIAGNOSIS — I129 Hypertensive chronic kidney disease with stage 1 through stage 4 chronic kidney disease, or unspecified chronic kidney disease: Secondary | ICD-10-CM | POA: Diagnosis not present

## 2022-05-31 DIAGNOSIS — I739 Peripheral vascular disease, unspecified: Secondary | ICD-10-CM | POA: Diagnosis not present

## 2022-05-31 DIAGNOSIS — E785 Hyperlipidemia, unspecified: Secondary | ICD-10-CM | POA: Diagnosis not present

## 2022-05-31 DIAGNOSIS — E1151 Type 2 diabetes mellitus with diabetic peripheral angiopathy without gangrene: Secondary | ICD-10-CM | POA: Diagnosis not present

## 2022-05-31 DIAGNOSIS — Z794 Long term (current) use of insulin: Secondary | ICD-10-CM | POA: Diagnosis not present

## 2022-06-15 ENCOUNTER — Encounter: Payer: Self-pay | Admitting: Internal Medicine

## 2022-06-21 NOTE — Progress Notes (Deleted)
[facility] History and Physical   Primary Care Physician:  [name], MD   Reason for Procedure:  Dysphagia, history of colon polyps history of achalasia  Plan:    EGD with possible esophageal dilation and colonoscopy -use abdominal binder to facilitate passage of scope given prior colonoscopy experience     HPI: Catherine Ramsey is a 76 y.o.  African-American woman with a history of precancerous colon polyps, achalasia type II status post Heller myotomy 2015, diabetes mellitus type 2 here for follow-up complaining of intermittent mild solid food dysphagia.  Particularly with meats.  She is currently taking omeprazole 20 mg daily as needed.  No frequent heartburn symptoms reported.  History of colon polyps, adenomas serrated adenomas/polyps and tubulovillous adenomas with last colonoscopy showing 7 diminutive adenomas in September 2020 and due for recall    Past Medical History:  Diagnosis Date   Achalasia - Type II 05/08/2010   Qualifier: Diagnosis of  By: Carlean Purl MD, Tonna Boehringer E    Acute kidney injury ([place]) 02/26/2019   Candida esophagitis ([place]) 03/26/2014   COVID-19 virus detected 02/25/2019   Diabetes mellitus    type 2   Family history of adverse reaction to anesthesia    son hard to wake up   Hypertension    Syncope 02/25/2019   Trigger ring finger of right hand 10/07/2016    Past Surgical History:  Procedure Laterality Date   ABDOMINAL HYSTERECTOMY     BREAST BIOPSY     left/benign   COLONOSCOPY  01/31/2014   ESOPHAGEAL MANOMETRY N/A 04/01/2014   Procedure: ESOPHAGEAL MANOMETRY (EM);  Surgeon: Gatha Mayer, MD;  Location: [facility];  Service: Endoscopy;  Laterality: N/A;   HELLER MYOTOMY N/A 12/07/2016   Procedure: LAPAROSCOPIC HELLER MYOTOMY, DOR FUNDOPLICATION, HIATAL HERNIA REPAIR, INTRAOPERATIVE UPPER ENDOSCOPY;  Surgeon: Jackolyn Confer, MD;  Location: [facility];  Service: General;  Laterality: N/A;   IR ANGIOGRAM PELVIS SELECTIVE OR  SUPRASELECTIVE  10/20/2021   IR RADIOLOGIST EVAL & MGMT  10/01/2021   IR RADIOLOGIST EVAL & MGMT  11/25/2021   IR TIB-PERO ART ATHEREC INC PTA MOD SED  10/15/2021   IR US GUIDE VASC ACCESS RIGHT  10/15/2021   TOTAL HIP ARTHROPLASTY     right pin then replacement    Prior to Admission medications   Medication Sig Start Date End Date Taking? Authorizing Provider  acetaminophen (TYLENOL) 500 MG tablet Take 1,000 mg by mouth every 6 (six) hours as needed for moderate pain.    [provider]  Alcohol Swabs (B-D SINGLE USE SWABS REGULAR) PADS  12/10/19   [provider]  amLODipine (NORVASC) 10 MG tablet Take 10 mg by mouth daily.    [provider]  Blood Glucose Monitoring Suppl (TRUE METRIX AIR GLUCOSE METER) w/Device KIT  11/30/19   [provider]  CINNAMON PO Take 1 capsule by mouth daily. With chromium    [provider]  empagliflozin (JARDIANCE) 10 MG TABS tablet Take 10 mg by mouth daily.    [provider]  gabapentin (NEURONTIN) 300 MG capsule Take 300 mg by mouth at bedtime.    [provider]  ibuprofen (ADVIL) 200 MG tablet Take 400 mg by mouth every 6 (six) hours as needed for moderate pain.    [provider]  insulin aspart (NOVOLOG) 100 UNIT/ML FlexPen Inject 9 Units into the skin 3 (three) times daily as needed for high blood sugar.    [provider]  Insulin  Syringe-Needle U-100 31G X 5/16" 0.5 ML MISC Use one syringe twice daily DX:  250.70 09/26/12   [provider]  KLOR-CON M20 20 MEQ tablet Take 20 mEq by mouth daily. 09/06/17   [provider]  LANTUS SOLOSTAR 100 UNIT/ML Solostar Pen Inject 17 Units into the skin daily. 10/24/19   [provider]  Liniments (BLUE-EMU SUPER STRENGTH) CREA Apply 1 application. topically 3 (three) times daily.    [provider]  losartan-hydrochlorothiazide (HYZAAR) 50-12.5 MG tablet Take 1 tablet by mouth daily.    [provider]  omeprazole (PRILOSEC) 20 MG capsule Take 20 mg by mouth daily as needed (acid reflux).    [provider]  pravastatin (PRAVACHOL) 40 MG tablet Take 40 mg by mouth at bedtime. 12/02/18   [provider]  TRUE METRIX BLOOD GLUCOSE TEST test strip  11/30/19   [provider]  TRUEplus Lancets 33G [place]  11/30/19   [provider]    Current Outpatient Medications  Medication Sig Dispense Refill   acetaminophen (TYLENOL) 500 MG tablet Take 1,000 mg by mouth every 6 (six) hours as needed for moderate pain.     Alcohol Swabs (B-D SINGLE USE SWABS REGULAR) PADS      amLODipine (NORVASC) 10 MG tablet Take 10 mg by mouth daily.     Blood Glucose Monitoring Suppl (TRUE METRIX AIR GLUCOSE METER) w/Device KIT      CINNAMON PO Take 1 capsule by mouth daily. With chromium     empagliflozin (JARDIANCE) 10 MG TABS tablet Take 10 mg by mouth daily.     gabapentin (NEURONTIN) 300 MG capsule Take 300 mg by mouth at bedtime.     ibuprofen (ADVIL) 200 MG tablet Take 400 mg by mouth every 6 (six) hours as needed for moderate pain.     insulin aspart (NOVOLOG) 100 UNIT/ML FlexPen Inject 9 Units into the skin 3 (three) times daily as needed for high blood sugar.     Insulin Syringe-Needle U-100 31G X 5/16" 0.5 ML MISC Use one syringe twice daily DX:  250.70     KLOR-CON M20 20 MEQ tablet Take 20 mEq by mouth daily.  0   LANTUS SOLOSTAR 100 UNIT/ML Solostar Pen Inject 17 Units into the skin daily.     Liniments (BLUE-EMU SUPER STRENGTH) CREA Apply 1 application. topically 3 (three) times daily.     losartan-hydrochlorothiazide (HYZAAR) 50-12.5 MG tablet Take 1 tablet by mouth daily.     omeprazole (PRILOSEC) 20 MG capsule Take 20 mg by mouth daily as needed (acid reflux).     pravastatin (PRAVACHOL) 40 MG tablet Take 40 mg by mouth at bedtime.     TRUE METRIX BLOOD GLUCOSE TEST test strip      TRUEplus Lancets 33G MISC      No current facility-administered  medications for this visit.    Allergies as of 06/22/2022 - Review Complete 05/20/2022  Allergen Reaction Noted   Lovastatin Rash 10/07/2021    Family History  Problem Relation Age of Onset   Diabetes Mother        died at 19 from Covid-19   Hypertension Mother    Hyperlipidemia Mother    Breast cancer Mother    Diabetes Father    Hypertension Father    Hypertension Sister    Hypertension Sister    Colon polyps Daughter    Colon cancer Neg Hx    Esophageal cancer Neg Hx    Rectal cancer Neg Hx  Stomach cancer Neg Hx     Social History   Socioeconomic History   Marital status: Married    Spouse name: Not on file   Number of children: Not on file   Years of education: Not on file   Highest education level: Not on file  Occupational History   Not on file  Tobacco Use   Smoking status: Never   Smokeless tobacco: Never  Vaping Use   Vaping Use: Never used  Substance and Sexual Activity   Alcohol use: No   Drug use: No   Sexual activity: Yes  Other Topics Concern   Not on file  Social History Narrative   Patient is married and retired   No alcohol tobacco or drug use   Social Determinants of Radio broadcast assistant Strain: Not on file  Food Insecurity: Not on file  Transportation Needs: Not on file  Physical Activity: Not on file  Stress: Not on file  Social Connections: Not on file  Intimate Partner Violence: Not on file    Review of Systems: Positive for *** All other review of systems negative except as mentioned in the HPI.  Physical Exam: Vital signs There were no vitals taken for this visit.  General:   Alert,  Well-developed, well-nourished, pleasant and cooperative in NAD Lungs:  Clear throughout to auscultation.   Heart:  Regular rate and rhythm; no murmurs, clicks, rubs,  or gallops. Abdomen:  Soft, nontender and nondistended. Normal bowel sounds.   Neuro/Psych:  Alert and cooperative. Normal mood and affect. A and O x 3   _0   E. Carlean Purl, MD, [facility] [phone] (pager) 06/21/2022 6:09 PM@

## 2022-06-22 ENCOUNTER — Encounter: Payer: Self-pay | Admitting: Internal Medicine

## 2022-06-22 ENCOUNTER — Ambulatory Visit: Payer: Medicare HMO | Admitting: Internal Medicine

## 2022-06-22 VITALS — BP 151/69 | HR 55 | Temp 96.0°F | Ht 67.0 in | Wt 185.0 lb

## 2022-06-22 DIAGNOSIS — Z86010 Personal history of colonic polyps: Secondary | ICD-10-CM

## 2022-06-22 DIAGNOSIS — H43393 Other vitreous opacities, bilateral: Secondary | ICD-10-CM | POA: Diagnosis not present

## 2022-06-22 DIAGNOSIS — R1319 Other dysphagia: Secondary | ICD-10-CM

## 2022-06-22 MED ORDER — SODIUM CHLORIDE 0.9 % IV SOLN: 500.0000 mL | INTRAVENOUS | Status: DC

## 2022-06-22 NOTE — Progress Notes (Signed)
Patient w/ new vision changes in left eye.  Referred to retina specialist Dr. Felton Clinton visit in [place] today.  We will cancel procedure and reschedule when visual disturbance issues are resolved.  Gatha Mayer, MD, [facility]

## 2022-06-22 NOTE — Progress Notes (Signed)
Patient states she is having visual disturbances and went to the eye doctor this morning.  States they told her something about "retinal detachment".  States her vision is worse this morning and she has a "cobweb" over her eye.   Dr. Carlean Purl informed and is at bedside talking to patient, procedure cancelled for today.  Patient informed she will need clearance from her retinal doctor (new appt is on 11/28) before rescheduling EGD and colonoscopy.   Patient verbalized understanding. SChaplin, RN,BSN

## 2022-06-23 NOTE — Progress Notes (Signed)
[facility] Note  06/29/2022     CHIEF COMPLAINT Patient presents for Retina Evaluation   HISTORY OF PRESENT ILLNESS: Catherine Ramsey is a 76 y.o. female who presents to the clinic today for:   HPI     Retina Evaluation   In right eye.  This started 3 weeks ago.  Associated Symptoms Flashes, Floaters and Distortion.  I, the attending physician,  performed the HPI with the patient and updated documentation appropriately.        Comments   Patient is here based on a referral from [facility] for a possible RD. She is complaining of seeing hair like strings in her vision. She is not using any drops. Her blood sugar was 121 and her A1C is 7.4.      Last edited by Bernarda Caffey, MD on 06/29/2022 11:44 AM.    Pt is here on the referral of [facility] for concern of RD OD, pt states she had cataract sx in June with Dr. Dynasty Fetter, she states about 2 weeks ago she started seeing fol and black dots in her vision, she states she started seeing hair like strings in her vision after that, pt is diabetic, she is on a 4m aspirin, she has no hx of heart attack or stroke  Referring physician: JLaurence Aly OMarch ARB  [place] [id number] HISTORICAL INFORMATION:   Selected notes from the MEDICAL RECORD NUMBER [facility]  Ocular Hx- PMH-    CURRENT MEDICATIONS: No current outpatient medications on file. (Ophthalmic Drugs)   No current facility-administered medications for this visit. (Ophthalmic Drugs)   Current Outpatient Medications (Other)  Medication Sig   acetaminophen (TYLENOL) 500 MG tablet Take 1,000 mg by mouth every 6 (six) hours as needed for moderate pain.   Alcohol Swabs (B-D SINGLE USE SWABS REGULAR) PADS    amLODipine (NORVASC) 10 MG tablet Take 10 mg by mouth daily.   Blood Glucose Monitoring Suppl (TRUE METRIX AIR GLUCOSE METER) w/Device KIT    CINNAMON PO Take 1 capsule by mouth daily. With  chromium   empagliflozin (JARDIANCE) 10 MG TABS tablet Take 10 mg by mouth daily.   gabapentin (NEURONTIN) 300 MG capsule Take 300 mg by mouth at bedtime.   ibuprofen (ADVIL) 200 MG tablet Take 400 mg by mouth every 6 (six) hours as needed for moderate pain.   insulin aspart (NOVOLOG) 100 UNIT/ML FlexPen Inject 9 Units into the skin 3 (three) times daily as needed for high blood sugar.   Insulin Syringe-Needle U-100 31G X 5/16" 0.5 ML MISC Use one syringe twice daily DX:  250.70   KLOR-CON M20 20 MEQ tablet Take 20 mEq by mouth daily.   LANTUS SOLOSTAR 100 UNIT/ML Solostar Pen Inject 17 Units into the skin daily.   Liniments (BLUE-EMU SUPER STRENGTH) CREA Apply 1 application. topically 3 (three) times daily.   losartan-hydrochlorothiazide (HYZAAR) 50-12.5 MG tablet Take 1 tablet by mouth daily.   omeprazole (PRILOSEC) 20 MG capsule Take 20 mg by mouth daily as needed (acid reflux).   pravastatin (PRAVACHOL) 40 MG tablet Take 40 mg by mouth at bedtime.   TRUE METRIX BLOOD GLUCOSE TEST test strip    TRUEplus Lancets 33G MISC    Current Facility-Administered Medications (Other)  Medication Route   0.9 %  sodium chloride infusion Intravenous   REVIEW OF SYSTEMS: ROS   Positive for: Endocrine, Eyes Last edited  by Annie Paras, COT on 06/29/2022  8:18 AM.     ALLERGIES Allergies  Allergen Reactions   Lovastatin Rash   PAST MEDICAL HISTORY Past Medical History:  Diagnosis Date   Achalasia - Type II 05/08/2010   Qualifier: Diagnosis of  By: Carlean Purl MD, Tonna Boehringer E    Acute kidney injury ([place]) 02/26/2019   Candida esophagitis ([place]) 03/26/2014   COVID-19 virus detected 02/25/2019   Diabetes mellitus    type 2   Family history of adverse reaction to anesthesia    son hard to wake up   Hypertension    Syncope 02/25/2019   Trigger ring finger of right hand 10/07/2016   Past Surgical History:  Procedure Laterality Date   ABDOMINAL HYSTERECTOMY     BREAST BIOPSY     left/benign    COLONOSCOPY  01/31/2014   ESOPHAGEAL MANOMETRY N/A 04/01/2014   Procedure: ESOPHAGEAL MANOMETRY (EM);  Surgeon: Gatha Mayer, MD;  [facility];  Service: Endoscopy;  Laterality: N/A;   HELLER MYOTOMY N/A 12/07/2016   Procedure: LAPAROSCOPIC HELLER MYOTOMY, DOR FUNDOPLICATION, HIATAL HERNIA REPAIR, INTRAOPERATIVE UPPER ENDOSCOPY;  Surgeon: Jackolyn Confer, MD;  Location: [facility];  Service: General;  Laterality: N/A;   IR ANGIOGRAM PELVIS SELECTIVE OR SUPRASELECTIVE  10/20/2021   IR RADIOLOGIST EVAL & MGMT  10/01/2021   IR RADIOLOGIST EVAL & MGMT  11/25/2021   IR TIB-PERO ART ATHEREC INC PTA MOD SED  10/15/2021   IR US GUIDE VASC ACCESS RIGHT  10/15/2021   TOTAL HIP ARTHROPLASTY     right pin then replacement   FAMILY HISTORY Family History  Problem Relation Age of Onset   Diabetes Mother        died at 48 from Covid-19   Hypertension Mother    Hyperlipidemia Mother    Breast cancer Mother    Diabetes Father    Hypertension Father    Hypertension Sister    Hypertension Sister    Colon polyps Daughter    Colon cancer Neg Hx    Esophageal cancer Neg Hx    Rectal cancer Neg Hx    Stomach cancer Neg Hx    SOCIAL HISTORY Social History   Tobacco Use   Smoking status: Never   Smokeless tobacco: Never  Vaping Use   Vaping Use: Never used  Substance Use Topics   Alcohol use: No   Drug use: No       OPHTHALMIC EXAM:  Base Eye Exam     Visual Acuity (Snellen - Linear)       Right Left   Dist [place] 20/40 20/25   Dist ph [place] NI          Tonometry (Tonopen, 8:21 AM)       Right Left   Pressure 10 8         Pupils       Dark Light Shape React APD   Right 4 3 Round Brisk None   Left 4 3 Round Brisk None         Visual Fields       Left Right    Full Full         Extraocular Movement       Right Left    Full, Ortho Full, Ortho         Neuro/Psych     Oriented x3: Yes         Dilation     Both eyes: 1.0% Mydriacyl, 2.5% Phenylephrine  @  8:18 AM           Slit Lamp and Fundus Exam     External Exam       Right Left   External Normal Normal         Slit Lamp Exam       Right Left   Lids/Lashes Dermatochalasis - upper lid Dermatochalasis - upper lid   Conjunctiva/Sclera Melanosis, Pinguecula Melanosis, Pinguecula   Cornea 1+ Punctate epithelial erosions, Well healed temporal cataract wound 2+ Punctate epithelial erosions, Well healed temporal cataract wound   Anterior Chamber Deep and Clear Deep and Clear   Iris Round and dilated, No NVI Round and dilated, No NVI   Lens PC IOL in good position, pigment/RBC on post optic PC IOL in good position,    Anterior Vitreous Syneresis, +RBC, diffuse blood stained vit Syneresis         Fundus Exam       Right Left   Disc Hazy view, mild pallor, sharp rim Mild pallor, sharp rim, Mild PPP   C/D Ratio 0.3 0.3   Macula Hazy view, grossly flat Good foveal reflext, mild ERM, scattered MA, no edema,   Vessels Attenuated, tortuous Attenuated, tortuous, +NV   Periphery Grossly attached, scatterd MA/DBH, focal fibrosis @ 1230; inferior periphery obscured by VH. Attached, scattered MA/DBH, preret heme inferiorly, Early fibrotic NVE nasal midzone           IMAGING AND PROCEDURES  Imaging and Procedures for 06/29/2022  OCT, Retina - OU - Both Eyes       Right Eye Quality was poor. Central Foveal Thickness: 238. Progression has no prior data. Findings include normal foveal contour, no IRF, no SRF (+vitreous opacities).   Left Eye Quality was good. Central Foveal Thickness: 256. Progression has no prior data. Findings include normal foveal contour, no IRF, no SRF, preretinal fibrosis (Partial PVD, PRF over disc).   Notes *Images captured and stored on drive  Diagnosis / Impression:  NFP, no IRF/SRF OU OD: +vitreous opacities OS: Partial PVD, PRF over disc  Clinical management:  See below  Abbreviations: NFP - Normal foveal profile. CME - cystoid macular  edema. PED - pigment epithelial detachment. IRF - intraretinal fluid. SRF - subretinal fluid. EZ - ellipsoid zone. ERM - epiretinal membrane. ORA - outer retinal atrophy. ORT - outer retinal tubulation. SRHM - subretinal hyper-reflective material. IRHM - intraretinal hyper-reflective material      Fluorescein Angiography Optos (Transit OD)       Right Eye Progression has no prior data. Early phase findings include blockage, retinal neovascularization, vascular perfusion defect. Mid/Late phase findings include blockage, leakage, microaneurysm, retinal neovascularization, vascular perfusion defect (Multiple patches NVE nasal hemisphere. Scattered blockage from diffuse VH. ).   Left Eye Progression has no prior data. Early phase findings include blockage, microaneurysm, vascular perfusion defect. Mid/Late phase findings include blockage, leakage, microaneurysm, retinal neovascularization, vascular perfusion defect (Scattered patches of NVE greatest nasal hemisphere. Large area of vascular non profusion, nasal periphery. ).   Notes *Images captured and stored on drive  Diagnosis / Impression:  PDR OU, Nasal NVE OU OD: +VH   Clinical management:  See below  Abbreviations: NFP - Normal foveal profile. CME - cystoid macular edema. PED - pigment epithelial detachment. IRF - intraretinal fluid. SRF - subretinal fluid. EZ - ellipsoid zone. ERM - epiretinal membrane. ORA - outer retinal atrophy. ORT - outer retinal tubulation. SRHM - subretinal hyper-reflective material. IRHM - intraretinal hyper-reflective material  Intravitreal Injection, Pharmacologic Agent - OD - Right Eye       Time Out 06/29/2022. 10:42 AM. Confirmed correct patient, procedure, site, and patient consented.   Anesthesia Topical anesthesia was used. Anesthetic medications included Lidocaine 4%, Proparacaine 0.5%.   Procedure Preparation included 5% betadine to ocular surface, eyelid speculum. A (32g) needle was  used.   Injection: 1.25 mg Bevacizumab 1.70m/0.05ml   Route: Intravitreal, Site: Right Eye   NDC: 50242-060-01, Lot:: [id number] Expiration date: 07/18/2022   Post-op Post injection exam found visual acuity of at least counting fingers. The patient tolerated the procedure well. There were no complications. The patient received written and verbal post procedure care education. Post injection medications were not given.            ASSESSMENT/PLAN:    ICD-10-CM   1. Proliferative diabetic retinopathy of both eyes without macular edema associated with type 2 diabetes mellitus (HCC)  E11.3593 OCT, Retina - OU - Both Eyes    Fluorescein Angiography Optos (Transit OD)    Intravitreal Injection, Pharmacologic Agent - OD - Right Eye    Bevacizumab (AVASTIN) SOLN 1.25 mg    2. Vitreous hemorrhage due to proliferative diabetic retinopathy (HCC)  ET62.5638Intravitreal Injection, Pharmacologic Agent - OD - Right Eye   H43.10 Bevacizumab (AVASTIN) SOLN 1.25 mg   OD    3. Essential hypertension  I10     4. Hypertensive retinopathy of both eyes  H35.033     5. Pseudophakia, both eyes  Z96.1       1,2. Proliferative diabetic retinopathy OU - The incidence, risk factors for progression, natural history and treatment options for diabetic retinopathy were discussed with patient.   - The need for close monitoring of blood glucose, blood pressure, and serum lipids, avoiding cigarette or any type of tobacco, and the need for long term follow up was also discussed with patient. - BCVA 20/40 OD, 20/25 OS - exam shows diffuse VH OD, OS with scattered MA, DBH and mild preretinal heme - FA (11.28.23) shows OD: Multiple patches NVE nasal hemisphere. Scattered blockage from diffuse VH. OS: Scattered patches of NVE greatest nasal hemisphere. Large area of vascular non profusion, nasal periphery. **pt will need PRP OU** - OCT shows NFP, no IRF/SRF OU, OD: +vit opacities; OS: partial PVD, PRF over disc -  discussed findings and prognosis - recommend IVA OD today (11.28.23) for VH then return for PRP OS wk of 12.11.23 - pt wishes to proceed with injection today, 11.28.23.  - RBA of procedure discussed, questions answered - informed consent obtained and signed for Avastin, 11.28.23 (OD) - see procedure note - VH precautions reviewed -- minimize activities, keep head elevated, avoid ASA/NSAIDs/blood thinners as able  - f/u wk of 12.11.23 for PRP OS.     3,4. Hypertensive retinopathy OU - discussed importance of tight BP control - monitor  5. Pseudophakia OU  - s/p CE/IOL OU (Dr. SNancy Fetter  - IOLs in good position, doing well  - monitor   Ophthalmic Meds Ordered this visit:  Meds ordered this encounter  Medications   Bevacizumab (AVASTIN) SOLN 1.25 mg       Return for wk of 07/12/22 for PRP OS.  There are no Patient Instructions on file for this visit.   Explained the diagnoses, plan, and follow up with the patient and they expressed understanding.  Patient expressed understanding of the importance of proper follow up care.   This document serves as a record of services personally performed  by Gardiner Sleeper, MD, PhD. It was created on their behalf by Renaldo Reel, [place] an ophthalmic technician. The creation of this record is the provider's dictation and/or activities during the visit.    Electronically signed by:  Renaldo Reel, COT  11.22.23 11:48 AM  This document serves as a record of services personally performed by Gardiner Sleeper, MD, PhD. It was created on their behalf by Orvan Falconer, an ophthalmic technician. The creation of this record is the provider's dictation and/or activities during the visit.    Electronically signed by: Orvan Falconer, OA, 06/29/22  11:48 AM 11.28.2023 11:48 AM  Gardiner Sleeper, M.D., Ph.D. Diseases & Surgery of the Retina and Vitreous [facility]  I have reviewed the above documentation for accuracy and  completeness, and I agree with the above. Gardiner Sleeper, M.D., Ph.D. 06/29/22 11:48 AM  Abbreviations: M myopia (nearsighted); A astigmatism; H hyperopia (farsighted); P presbyopia; Mrx spectacle prescription;  CTL contact lenses; OD right eye; OS left eye; OU both eyes  XT exotropia; ET esotropia; PEK punctate epithelial keratitis; PEE punctate epithelial erosions; DES dry eye syndrome; MGD meibomian gland dysfunction; ATs artificial tears; PFAT's preservative free artificial tears; [place] nuclear sclerotic cataract; PSC posterior subcapsular cataract; ERM epi-retinal membrane; PVD posterior vitreous detachment; RD retinal detachment; DM diabetes mellitus; DR diabetic retinopathy; NPDR non-proliferative diabetic retinopathy; PDR proliferative diabetic retinopathy; CSME clinically significant macular edema; DME diabetic macular edema; dbh dot blot hemorrhages; CWS cotton wool spot; POAG primary open angle glaucoma; C/D cup-to-disc ratio; HVF humphrey visual field; GVF goldmann visual field; OCT optical coherence tomography; IOP intraocular pressure; BRVO Branch retinal vein occlusion; CRVO central retinal vein occlusion; CRAO central retinal artery occlusion; BRAO branch retinal artery occlusion; RT retinal tear; SB scleral buckle; PPV pars plana vitrectomy; VH Vitreous hemorrhage; PRP panretinal laser photocoagulation; IVK intravitreal kenalog; VMT vitreomacular traction; MH Macular hole;  NVD neovascularization of the disc; NVE neovascularization elsewhere; AREDS age related eye disease study; ARMD age related macular degeneration; POAG primary open angle glaucoma; EBMD epithelial/anterior basement membrane dystrophy; ACIOL anterior chamber intraocular lens; IOL intraocular lens; PCIOL posterior chamber intraocular lens; Phaco/IOL phacoemulsification with intraocular lens placement; [place] photorefractive keratectomy; LASIK laser assisted in situ keratomileusis; HTN hypertension; DM diabetes mellitus; COPD chronic  obstructive pulmonary disease

## 2022-06-29 ENCOUNTER — Ambulatory Visit (INDEPENDENT_AMBULATORY_CARE_PROVIDER_SITE_OTHER): Payer: Medicare HMO | Admitting: Ophthalmology

## 2022-06-29 ENCOUNTER — Encounter (INDEPENDENT_AMBULATORY_CARE_PROVIDER_SITE_OTHER): Payer: Self-pay | Admitting: Ophthalmology

## 2022-06-29 DIAGNOSIS — E113593 Type 2 diabetes mellitus with proliferative diabetic retinopathy without macular edema, bilateral: Secondary | ICD-10-CM

## 2022-06-29 DIAGNOSIS — H35033 Hypertensive retinopathy, bilateral: Secondary | ICD-10-CM | POA: Diagnosis not present

## 2022-06-29 DIAGNOSIS — H3581 Retinal edema: Secondary | ICD-10-CM

## 2022-06-29 DIAGNOSIS — Z961 Presence of intraocular lens: Secondary | ICD-10-CM | POA: Diagnosis not present

## 2022-06-29 DIAGNOSIS — E113599 Type 2 diabetes mellitus with proliferative diabetic retinopathy without macular edema, unspecified eye: Secondary | ICD-10-CM

## 2022-06-29 DIAGNOSIS — I10 Essential (primary) hypertension: Secondary | ICD-10-CM

## 2022-06-29 DIAGNOSIS — H4310 Vitreous hemorrhage, unspecified eye: Secondary | ICD-10-CM

## 2022-06-29 MED ORDER — BEVACIZUMAB CHEMO INJECTION 1.25MG/0.05ML SYRINGE FOR KALEIDOSCOPE
1.2500 mg | INTRAVITREAL | Status: AC | PRN
  Administered 2022-06-29: 1.25 mg via INTRAVITREAL

## 2022-07-02 ENCOUNTER — Ambulatory Visit (INDEPENDENT_AMBULATORY_CARE_PROVIDER_SITE_OTHER): Payer: Medicare HMO | Admitting: Podiatry

## 2022-07-02 ENCOUNTER — Encounter: Payer: Self-pay | Admitting: Podiatry

## 2022-07-02 DIAGNOSIS — B351 Tinea unguium: Secondary | ICD-10-CM

## 2022-07-02 DIAGNOSIS — I739 Peripheral vascular disease, unspecified: Secondary | ICD-10-CM | POA: Diagnosis not present

## 2022-07-02 DIAGNOSIS — I999 Unspecified disorder of circulatory system: Secondary | ICD-10-CM | POA: Diagnosis not present

## 2022-07-02 DIAGNOSIS — M2042 Other hammer toe(s) (acquired), left foot: Secondary | ICD-10-CM

## 2022-07-02 DIAGNOSIS — M79675 Pain in left toe(s): Secondary | ICD-10-CM

## 2022-07-02 DIAGNOSIS — E1142 Type 2 diabetes mellitus with diabetic polyneuropathy: Secondary | ICD-10-CM

## 2022-07-02 DIAGNOSIS — M79674 Pain in right toe(s): Secondary | ICD-10-CM | POA: Diagnosis not present

## 2022-07-02 DIAGNOSIS — M2041 Other hammer toe(s) (acquired), right foot: Secondary | ICD-10-CM

## 2022-07-02 NOTE — Progress Notes (Signed)
This patient returns to my office for at risk foot care.  This patient requires this care by a professional since this patient will be at risk due to having diabetes and PAD and vascular disease.  This patient is unable to cut nails herself since the patient cannot reach her nails.These nails are painful walking and wearing shoes.  This patient presents for at risk foot care today.  General Appearance  Alert, conversant and in no acute stress.  Vascular  Dorsalis pedis and posterior tibial  pulses are  not palpable  bilaterally.  Capillary return is within normal limits  bilaterally. Cold feet.  bilaterally.  Absent hair.    Neurologic  Senn-Weinstein monofilament wire test within normal limits  bilaterally. Muscle power within normal limits bilaterally.  Nails Thick disfigured discolored nails with subungual debris  from hallux to fifth toes bilaterally. No evidence of bacterial infection or drainage bilaterally.  Orthopedic  No limitations of motion  feet .  No crepitus or effusions noted.  No bony pathology or digital deformities noted. Hammer toes  B/L.  Skin  normotropic skin with no porokeratosis noted bilaterally.  No signs of infections or ulcers noted.     Onychomycosis  Pain in right toes  Pain in left toes  Consent was obtained for treatment procedures.   Mechanical debridement of nails 1-5  bilaterally performed with a nail nipper.  Filed with dremel without incident.    Return office visit   3 months                   Told patient to return for periodic foot care and evaluation due to potential at risk complications.   Gardiner Barefoot DPM

## 2022-07-12 NOTE — Progress Notes (Signed)
[facility] Note  07/13/2022     CHIEF COMPLAINT Patient presents for Retina Follow Up   HISTORY OF PRESENT ILLNESS: Catherine Ramsey is a 76 y.o. female who presents to the clinic today for:   HPI     Retina Follow Up   Patient presents with  Diabetic Retinopathy.  In both eyes.  Severity is moderate.  Duration of 2 weeks.  Since onset it is stable.  I, the attending physician,  performed the HPI with the patient and updated documentation appropriately.        Comments   Pt here for 2 wk ret f/u PDR OU. Pt here for PRP OS today. Pt states VA in OD has improved, not seeing as many stringy lines as she was before.       Last edited by Bernarda Caffey, MD on 07/13/2022 12:50 PM.    Pt here for PRP OS   Referring physician: [name], MD [place],  [place] [address]  HISTORICAL INFORMATION:   Selected notes from the MEDICAL RECORD NUMBER Referred by Dr. Ronnald Ramp for [facility] OD LEE:  Ocular Hx- PMH-    CURRENT MEDICATIONS: Current Outpatient Medications (Ophthalmic Drugs)  Medication Sig   prednisoLONE acetate (PRED FORTE) 1 % ophthalmic suspension Place 1 drop into the left eye 4 (four) times daily for 7 days.   No current facility-administered medications for this visit. (Ophthalmic Drugs)   Current Outpatient Medications (Other)  Medication Sig   acetaminophen (TYLENOL) 500 MG tablet Take 1,000 mg by mouth every 6 (six) hours as needed for moderate pain.   Alcohol Swabs (B-D SINGLE USE SWABS REGULAR) PADS    amLODipine (NORVASC) 10 MG tablet Take 10 mg by mouth daily.   Blood Glucose Monitoring Suppl (TRUE METRIX AIR GLUCOSE METER) w/Device KIT    CINNAMON PO Take 1 capsule by mouth daily. With chromium   empagliflozin (JARDIANCE) 10 MG TABS tablet Take 10 mg by mouth daily.   gabapentin (NEURONTIN) 300 MG capsule Take 300 mg by mouth at bedtime.   ibuprofen (ADVIL) 200 MG tablet Take 400 mg by mouth every 6 (six) hours  as needed for moderate pain.   insulin aspart (NOVOLOG) 100 UNIT/ML FlexPen Inject 9 Units into the skin 3 (three) times daily as needed for high blood sugar.   Insulin Syringe-Needle U-100 31G X 5/16" 0.5 ML MISC Use one syringe twice daily DX:  250.70   KLOR-CON M20 20 MEQ tablet Take 20 mEq by mouth daily.   LANTUS SOLOSTAR 100 UNIT/ML Solostar Pen Inject 17 Units into the skin daily.   Liniments (BLUE-EMU SUPER STRENGTH) CREA Apply 1 application. topically 3 (three) times daily.   losartan-hydrochlorothiazide (HYZAAR) 50-12.5 MG tablet Take 1 tablet by mouth daily.   omeprazole (PRILOSEC) 20 MG capsule Take 20 mg by mouth daily as needed (acid reflux).   pravastatin (PRAVACHOL) 40 MG tablet Take 40 mg by mouth at bedtime.   TRUE METRIX BLOOD GLUCOSE TEST test strip    TRUEplus Lancets 33G MISC    Current Facility-Administered Medications (Other)  Medication Route   0.9 %  sodium chloride infusion Intravenous   REVIEW OF SYSTEMS: ROS   Positive for: Endocrine, Eyes Negative for: Constitutional, Gastrointestinal, Neurological, Skin, Genitourinary, Musculoskeletal, HENT, Cardiovascular, Respiratory, Psychiatric, Allergic/Imm, Heme/Lymph Last edited by Kingsley Spittle, COT on 07/13/2022  9:46 AM.      ALLERGIES Allergies  Allergen Reactions   Lovastatin Rash   PAST  MEDICAL HISTORY Past Medical History:  Diagnosis Date   Achalasia - Type II 05/08/2010   Qualifier: Diagnosis of  By: Carlean Purl MD, Tonna Boehringer E    Acute kidney injury ([place]) 02/26/2019   Candida esophagitis ([place]) 03/26/2014   COVID-19 virus detected 02/25/2019   Diabetes mellitus    type 2   Family history of adverse reaction to anesthesia    son hard to wake up   Hypertension    Syncope 02/25/2019   Trigger ring finger of right hand 10/07/2016   Past Surgical History:  Procedure Laterality Date   ABDOMINAL HYSTERECTOMY     BREAST BIOPSY     left/benign   COLONOSCOPY  01/31/2014   ESOPHAGEAL MANOMETRY N/A  04/01/2014   Procedure: ESOPHAGEAL MANOMETRY (EM);  Surgeon: Gatha Mayer, MD;  Location: [facility];  Service: Endoscopy;  Laterality: N/A;   HELLER MYOTOMY N/A 12/07/2016   Procedure: LAPAROSCOPIC HELLER MYOTOMY, DOR FUNDOPLICATION, HIATAL HERNIA REPAIR, INTRAOPERATIVE UPPER ENDOSCOPY;  Surgeon: Jackolyn Confer, MD;  Location: [facility];  Service: General;  Laterality: N/A;   IR ANGIOGRAM PELVIS SELECTIVE OR SUPRASELECTIVE  10/20/2021   IR RADIOLOGIST EVAL & MGMT  10/01/2021   IR RADIOLOGIST EVAL & MGMT  11/25/2021   IR TIB-PERO ART ATHEREC INC PTA MOD SED  10/15/2021   IR US GUIDE VASC ACCESS RIGHT  10/15/2021   TOTAL HIP ARTHROPLASTY     right pin then replacement   FAMILY HISTORY Family History  Problem Relation Age of Onset   Diabetes Mother        died at 25 from Covid-19   Hypertension Mother    Hyperlipidemia Mother    Breast cancer Mother    Diabetes Father    Hypertension Father    Hypertension Sister    Hypertension Sister    Colon polyps Daughter    Colon cancer Neg Hx    Esophageal cancer Neg Hx    Rectal cancer Neg Hx    Stomach cancer Neg Hx    SOCIAL HISTORY Social History   Tobacco Use   Smoking status: Never   Smokeless tobacco: Never  Vaping Use   Vaping Use: Never used  Substance Use Topics   Alcohol use: No   Drug use: No       OPHTHALMIC EXAM:  Base Eye Exam     Visual Acuity (Snellen - Linear)       Right Left   Dist [place] 20/30 -1 20/25 +1   Dist ph [place] 20/20 -1         Tonometry (Tonopen, 9:51 AM)       Right Left   Pressure 15 13         Pupils       Pupils Dark Light Shape React APD   Right PERRL 4 3 Round Brisk None   Left PERRL 4 3 Round Brisk None         Visual Fields (Counting fingers)       Left Right    Full Full         Extraocular Movement       Right Left    Full, Ortho Full, Ortho         Neuro/Psych     Oriented x3: Yes   Mood/Affect: Normal         Dilation     Both eyes: 1.0%  Mydriacyl, 2.5% Phenylephrine @ 9:51 AM           Slit  Lamp and Fundus Exam     External Exam       Right Left   External Normal Normal         Slit Lamp Exam       Right Left   Lids/Lashes Dermatochalasis - upper lid Dermatochalasis - upper lid   Conjunctiva/Sclera Melanosis, Pinguecula Melanosis, Pinguecula   Cornea 1+ Punctate epithelial erosions, Well healed temporal cataract wound 2+ Punctate epithelial erosions, Well healed temporal cataract wound   Anterior Chamber Deep and Clear Deep and Clear   Iris Round and dilated, No NVI Round and dilated, No NVI   Lens PC IOL in good position, pigment/RBC on post optic PC IOL in good position,    Anterior Vitreous Syneresis, +RBC, diffuse blood stained vit Syneresis         Fundus Exam       Right Left   Disc Hazy view, mild pallor, sharp rim Mild pallor, sharp rim, Mild PPP   C/D Ratio 0.3 0.3   Macula Hazy view, grossly flat Good foveal reflext, mild ERM, scattered MA, no edema,   Vessels Attenuated, tortuous Attenuated, tortuous, +NV   Periphery Grossly attached, scatterd MA/DBH, focal fibrosis @ 1230; inferior periphery obscured by VH. Attached, scattered MA/DBH, preret heme inferiorly, Early fibrotic NVE nasal midzone           IMAGING AND PROCEDURES  Imaging and Procedures for 07/13/2022  OCT, Retina - OU - Both Eyes       Right Eye Quality was poor. Central Foveal Thickness: 243. Progression has improved. Findings include normal foveal contour, no IRF, no SRF (Interval improvement in vitreous opacities).   Left Eye Quality was good. Central Foveal Thickness: 255. Progression has been stable. Findings include normal foveal contour, no IRF, no SRF, preretinal fibrosis (Partial PVD, PRF over disc).   Notes *Images captured and stored on drive  Diagnosis / Impression:  NFP, no IRF/SRF OU OD: interval improvement in vitreous opacities OS: Partial PVD, PRF over disc -- stable  Clinical management:  See  below  Abbreviations: NFP - Normal foveal profile. CME - cystoid macular edema. PED - pigment epithelial detachment. IRF - intraretinal fluid. SRF - subretinal fluid. EZ - ellipsoid zone. ERM - epiretinal membrane. ORA - outer retinal atrophy. ORT - outer retinal tubulation. SRHM - subretinal hyper-reflective material. IRHM - intraretinal hyper-reflective material      Panretinal Photocoagulation - OS - Left Eye       LASER PROCEDURE NOTE  Diagnosis:   Proliferative Diabetic Retinopathy, LEFT EYE  Procedure:  Pan-retinal photocoagulation using slit lamp laser, LEFT EYE, fill-in  Anesthesia:  Topical  Surgeon: Bernarda Caffey, MD, PhD   Informed consent obtained, operative eye marked, and time out performed prior to initiation of laser.   Lumenis [id number] slit lamp laser Pattern: 3x3 square Power: 320 mW Duration: 40 msec  Spot size: 200 microns  # spots: [phone] spots  Complications: None.  RTC: Wk of Dec 25 for DFE/OCT, possible injxn  Patient tolerated the procedure well and received written and verbal post-procedure care information/education.            ASSESSMENT/PLAN:    ICD-10-CM   1. Proliferative diabetic retinopathy of both eyes without macular edema associated with type 2 diabetes mellitus (HCC)  E11.3593 OCT, Retina - OU - Both Eyes    Panretinal Photocoagulation - OS - Left Eye    2. Vitreous hemorrhage due to proliferative diabetic retinopathy ([place])  E11.3599    H43.10  3. Essential hypertension  I10     4. Hypertensive retinopathy of both eyes  H35.033     5. Pseudophakia, both eyes  Z96.1       1,2. Proliferative diabetic retinopathy OU - s/p IVA OD #1 (11.28.23) - BCVA 20/40 OD, 20/25 OS - exam shows diffuse VH OD -- improving, OS with scattered MA, DBH and mild preretinal heme - FA (11.28.23) shows OD: Multiple patches NVE nasal hemisphere. Scattered blockage from diffuse VH. OS: Scattered patches of NVE greatest nasal hemisphere. Large  area of vascular non profusion, nasal periphery. **pt will need PRP OU** - OCT shows NFP, no IRF/SRF OU, OD: +vit opacities improving; OS: partial PVD, PRF over disc -- stable - discussed findings and prognosis - recommend PRP OS today, 12.12.23 - pt wishes to proceed with laser today - RBA of procedure discussed, questions answered - informed consent obtained and signed for Avastin, 11.28.23 (OD) - see procedure note - start PF QID OS x7 days - VH precautions reviewed -- minimize activities, keep head elevated, avoid ASA/NSAIDs/blood thinners as able  - f/u week of December 28 or 29, possible injection(s)   3,4. Hypertensive retinopathy OU - discussed importance of tight BP control - monitor  5. Pseudophakia OU  - s/p CE/IOL OU (Dr. Evie Fetter)  - IOLs in good position, doing well  - monitor   Ophthalmic Meds Ordered this visit:  Meds ordered this encounter  Medications   prednisoLONE acetate (PRED FORTE) 1 % ophthalmic suspension    Sig: Place 1 drop into the left eye 4 (four) times daily for 7 days.    Dispense:  10 mL    Refill:  0     Return for f/u December 28 or 29, PDR OU, DFE, OCT.  There are no Patient Instructions on file for this visit.   Explained the diagnoses, plan, and follow up with the patient and they expressed understanding.  Patient expressed understanding of the importance of proper follow up care.   This document serves as a record of services personally performed by Gardiner Sleeper, MD, PhD. It was created on their behalf by San Jetty. Owens Shark, OA an ophthalmic technician. The creation of this record is the provider's dictation and/or activities during the visit.    Electronically signed by: San Jetty. Owens Shark, [place] 12.11.2023 12:50 PM   Gardiner Sleeper, M.D., Ph.D. Diseases & Surgery of the Retina and Vitreous [facility]  I have reviewed the above documentation for accuracy and completeness, and I agree with the above. Gardiner Sleeper,  M.D., Ph.D. 07/13/22 12:51 PM  Abbreviations: M myopia (nearsighted); A astigmatism; H hyperopia (farsighted); P presbyopia; Mrx spectacle prescription;  CTL contact lenses; OD right eye; OS left eye; OU both eyes  XT exotropia; ET esotropia; PEK punctate epithelial keratitis; PEE punctate epithelial erosions; DES dry eye syndrome; MGD meibomian gland dysfunction; ATs artificial tears; PFAT's preservative free artificial tears; [place] nuclear sclerotic cataract; PSC posterior subcapsular cataract; ERM epi-retinal membrane; PVD posterior vitreous detachment; RD retinal detachment; DM diabetes mellitus; DR diabetic retinopathy; NPDR non-proliferative diabetic retinopathy; PDR proliferative diabetic retinopathy; CSME clinically significant macular edema; DME diabetic macular edema; dbh dot blot hemorrhages; CWS cotton wool spot; POAG primary open angle glaucoma; C/D cup-to-disc ratio; HVF humphrey visual field; GVF goldmann visual field; OCT optical coherence tomography; IOP intraocular pressure; BRVO Branch retinal vein occlusion; CRVO central retinal vein occlusion; CRAO central retinal artery occlusion; BRAO branch retinal artery occlusion; RT retinal tear;  SB scleral buckle; PPV pars plana vitrectomy; VH Vitreous hemorrhage; PRP panretinal laser photocoagulation; IVK intravitreal kenalog; VMT vitreomacular traction; MH Macular hole;  NVD neovascularization of the disc; NVE neovascularization elsewhere; AREDS age related eye disease study; ARMD age related macular degeneration; POAG primary open angle glaucoma; EBMD epithelial/anterior basement membrane dystrophy; ACIOL anterior chamber intraocular lens; IOL intraocular lens; PCIOL posterior chamber intraocular lens; Phaco/IOL phacoemulsification with intraocular lens placement; PRK photorefractive keratectomy; LASIK laser assisted in situ keratomileusis; HTN hypertension; DM diabetes mellitus; COPD chronic obstructive pulmonary disease 

## 2022-07-13 ENCOUNTER — Ambulatory Visit (INDEPENDENT_AMBULATORY_CARE_PROVIDER_SITE_OTHER): Payer: Medicare HMO | Admitting: Ophthalmology

## 2022-07-13 ENCOUNTER — Encounter (INDEPENDENT_AMBULATORY_CARE_PROVIDER_SITE_OTHER): Payer: Self-pay | Admitting: Ophthalmology

## 2022-07-13 DIAGNOSIS — E113593 Type 2 diabetes mellitus with proliferative diabetic retinopathy without macular edema, bilateral: Secondary | ICD-10-CM

## 2022-07-13 DIAGNOSIS — E113599 Type 2 diabetes mellitus with proliferative diabetic retinopathy without macular edema, unspecified eye: Secondary | ICD-10-CM

## 2022-07-13 DIAGNOSIS — H35033 Hypertensive retinopathy, bilateral: Secondary | ICD-10-CM | POA: Diagnosis not present

## 2022-07-13 DIAGNOSIS — Z961 Presence of intraocular lens: Secondary | ICD-10-CM

## 2022-07-13 DIAGNOSIS — I10 Essential (primary) hypertension: Secondary | ICD-10-CM | POA: Diagnosis not present

## 2022-07-13 DIAGNOSIS — H4310 Vitreous hemorrhage, unspecified eye: Secondary | ICD-10-CM | POA: Diagnosis not present

## 2022-07-13 MED ORDER — PREDNISOLONE ACETATE 1 % OP SUSP: 1.0000 [drp] | Freq: Four times a day (QID) | OPHTHALMIC | 0 refills | Status: AC

## 2022-07-19 NOTE — Progress Notes (Signed)
[facility] Note  07/29/2022     CHIEF COMPLAINT Patient presents for Retina Follow Up   HISTORY OF PRESENT ILLNESS: Catherine Ramsey is a 76 y.o. female who presents to the clinic today for:   HPI     Retina Follow Up   Patient presents with  Diabetic Retinopathy.  In both eyes.  Severity is moderate.  Duration of 2 weeks.  Since onset it is stable.  I, the attending physician,  performed the HPI with the patient and updated documentation appropriately.        Comments   Patient feels that the vision is slightly better. The vision is slowly clearing. She finished the eye drops up after the laser. Her blood sugar was 123.      Last edited by Bernarda Caffey, MD on 07/29/2022  4:49 PM.     Referring physician: [name], MD [place],  [place] [address]  HISTORICAL INFORMATION:   Selected notes from the MEDICAL RECORD NUMBER Referred by Dr. Ronnald Ramp for [facility] OD LEE:  Ocular Hx- PMH-    CURRENT MEDICATIONS: No current outpatient medications on file. (Ophthalmic Drugs)   No current facility-administered medications for this visit. (Ophthalmic Drugs)   Current Outpatient Medications (Other)  Medication Sig   acetaminophen (TYLENOL) 500 MG tablet Take 1,000 mg by mouth every 6 (six) hours as needed for moderate pain.   Alcohol Swabs (B-D SINGLE USE SWABS REGULAR) PADS    amLODipine (NORVASC) 10 MG tablet Take 10 mg by mouth daily.   Blood Glucose Monitoring Suppl (TRUE METRIX AIR GLUCOSE METER) w/Device KIT    CINNAMON PO Take 1 capsule by mouth daily. With chromium   empagliflozin (JARDIANCE) 10 MG TABS tablet Take 10 mg by mouth daily.   gabapentin (NEURONTIN) 300 MG capsule Take 300 mg by mouth at bedtime.   ibuprofen (ADVIL) 200 MG tablet Take 400 mg by mouth every 6 (six) hours as needed for moderate pain.   insulin aspart (NOVOLOG) 100 UNIT/ML FlexPen Inject 9 Units into the skin 3 (three) times daily as needed for  high blood sugar.   Insulin Syringe-Needle U-100 31G X 5/16" 0.5 ML MISC Use one syringe twice daily DX:  250.70   KLOR-CON M20 20 MEQ tablet Take 20 mEq by mouth daily.   LANTUS SOLOSTAR 100 UNIT/ML Solostar Pen Inject 17 Units into the skin daily.   Liniments (BLUE-EMU SUPER STRENGTH) CREA Apply 1 application. topically 3 (three) times daily.   losartan-hydrochlorothiazide (HYZAAR) 50-12.5 MG tablet Take 1 tablet by mouth daily.   omeprazole (PRILOSEC) 20 MG capsule Take 20 mg by mouth daily as needed (acid reflux).   pravastatin (PRAVACHOL) 40 MG tablet Take 40 mg by mouth at bedtime.   TRUE METRIX BLOOD GLUCOSE TEST test strip    TRUEplus Lancets 33G MISC    Current Facility-Administered Medications (Other)  Medication Route   0.9 %  sodium chloride infusion Intravenous   REVIEW OF SYSTEMS: ROS   Positive for: Endocrine, Eyes Negative for: Constitutional, Gastrointestinal, Neurological, Skin, Genitourinary, Musculoskeletal, HENT, Cardiovascular, Respiratory, Psychiatric, Allergic/Imm, Heme/Lymph Last edited by Annie Paras, COT on 07/29/2022  2:05 PM.     ALLERGIES Allergies  Allergen Reactions   Lovastatin Rash   PAST MEDICAL HISTORY Past Medical History:  Diagnosis Date   Achalasia - Type II 05/08/2010   Qualifier: Diagnosis of  By: Carlean Purl MD, Tonna Boehringer E    Acute kidney injury ([place]) 02/26/2019  Candida esophagitis ([place]) 03/26/2014   COVID-19 virus detected 02/25/2019   Diabetes mellitus    type 2   Family history of adverse reaction to anesthesia    son hard to wake up   Hypertension    Syncope 02/25/2019   Trigger ring finger of right hand 10/07/2016   Past Surgical History:  Procedure Laterality Date   ABDOMINAL HYSTERECTOMY     BREAST BIOPSY     left/benign   COLONOSCOPY  01/31/2014   ESOPHAGEAL MANOMETRY N/A 04/01/2014   Procedure: ESOPHAGEAL MANOMETRY (EM);  Surgeon: Gatha Mayer, MD;  Location: [facility];  Service: Endoscopy;  Laterality: N/A;    HELLER MYOTOMY N/A 12/07/2016   Procedure: LAPAROSCOPIC HELLER MYOTOMY, DOR FUNDOPLICATION, HIATAL HERNIA REPAIR, INTRAOPERATIVE UPPER ENDOSCOPY;  Surgeon: Jackolyn Confer, MD;  Location: [facility];  Service: General;  Laterality: N/A;   IR ANGIOGRAM PELVIS SELECTIVE OR SUPRASELECTIVE  10/20/2021   IR RADIOLOGIST EVAL & MGMT  10/01/2021   IR RADIOLOGIST EVAL & MGMT  11/25/2021   IR TIB-PERO ART ATHEREC INC PTA MOD SED  10/15/2021   IR US GUIDE VASC ACCESS RIGHT  10/15/2021   TOTAL HIP ARTHROPLASTY     right pin then replacement   FAMILY HISTORY Family History  Problem Relation Age of Onset   Diabetes Mother        died at 67 from Covid-19   Hypertension Mother    Hyperlipidemia Mother    Breast cancer Mother    Diabetes Father    Hypertension Father    Hypertension Sister    Hypertension Sister    Colon polyps Daughter    Colon cancer Neg Hx    Esophageal cancer Neg Hx    Rectal cancer Neg Hx    Stomach cancer Neg Hx    SOCIAL HISTORY Social History   Tobacco Use   Smoking status: Never   Smokeless tobacco: Never  Vaping Use   Vaping Use: Never used  Substance Use Topics   Alcohol use: No   Drug use: No       OPHTHALMIC EXAM:  Base Eye Exam     Visual Acuity (Snellen - Linear)       Right Left   Dist [place] 20/30 -1 20/25   Dist ph [place] NI          Tonometry (Tonopen, 2:10 PM)       Right Left   Pressure 14 11         Pupils       Dark Light Shape React APD   Right 4 3 Round Brisk None   Left 4 3 Round Brisk None         Visual Fields       Left Right    Full Full         Extraocular Movement       Right Left    Full, Ortho Full, Ortho         Neuro/Psych     Oriented x3: Yes   Mood/Affect: Normal         Dilation     Both eyes: 1.0% Mydriacyl, 2.5% Phenylephrine @ 2:07 PM           Slit Lamp and Fundus Exam     External Exam       Right Left   External Normal Normal         Slit Lamp Exam       Right Left  Lids/Lashes Dermatochalasis - upper lid Dermatochalasis - upper lid   Conjunctiva/Sclera Melanosis, Pinguecula Melanosis, Pinguecula   Cornea 1+ Punctate epithelial erosions, Well healed temporal cataract wound 2+ Punctate epithelial erosions, Well healed temporal cataract wound   Anterior Chamber Deep and Clear Deep and Clear   Iris Round and dilated, No NVI Round and dilated, No NVI   Lens PC IOL in good position, pigment/RBC on post optic PC IOL in good position,    Anterior Vitreous Syneresis, +RBC, diffuse blood stained vit-clearing centrally and settling inferiorly. Syneresis         Fundus Exam       Right Left   Disc Pink and sharp, mild pallor, sharp rim, mild PPP Mild pallor, sharp rim, Mild PPP   C/D Ratio 0.2 0.3   Macula Hazy view-- improved good foveal reflext, no heme or edema Good foveal reflex, mild ERM, scattered MA, no edema   Vessels Attenuated, tortuous, severe attenuation IN arcades Attenuated, tortuous, +NV   Periphery Attached, scatterd MA/DBH, focal fibrosis @ 1130 Attached, scattered MA/DBH, preret heme inferiorly--improving, Early fibrotic NVE nasal midzone, good 360 PRP changes           IMAGING AND PROCEDURES  Imaging and Procedures for 07/29/2022  OCT, Retina - OU - Both Eyes       Right Eye Quality was poor. Central Foveal Thickness: 236. Progression has improved. Findings include normal foveal contour, no IRF, no SRF (Mild Interval improvement in vitreous opacities).   Left Eye Quality was good. Central Foveal Thickness: 260. Progression has been stable. Findings include normal foveal contour, no IRF, no SRF, preretinal fibrosis (Partial PVD, PRF over disc).   Notes *Images captured and stored on drive  Diagnosis / Impression:  NFP, no IRF/SRF OU OD: mild interval improvement in vitreous opacities OS: Partial PVD, PRF over disc -- stable  Clinical management:  See below  Abbreviations: NFP - Normal foveal profile. CME - cystoid macular  edema. PED - pigment epithelial detachment. IRF - intraretinal fluid. SRF - subretinal fluid. EZ - ellipsoid zone. ERM - epiretinal membrane. ORA - outer retinal atrophy. ORT - outer retinal tubulation. SRHM - subretinal hyper-reflective material. IRHM - intraretinal hyper-reflective material      Intravitreal Injection, Pharmacologic Agent - OD - Right Eye       Time Out 07/29/2022. 3:18 PM. Confirmed correct patient, procedure, site, and patient consented.   Anesthesia Topical anesthesia was used. Anesthetic medications included Lidocaine 4%, Proparacaine 0.5%.   Procedure Preparation included 5% betadine to ocular surface, eyelid speculum. A supplied (32g) needle was used.   Injection: 1.25 mg Bevacizumab 1.97m/0.05ml   Route: Intravitreal, Site: Right Eye   NDC: 50242-060-01, Lot: [id number] , Expiration date: 08/09/2022   Post-op Post injection exam found visual acuity of at least counting fingers. The patient tolerated the procedure well. There were no complications. The patient received written and verbal post procedure care education. Post injection medications were not given.             ASSESSMENT/PLAN:    ICD-10-CM   1. Proliferative diabetic retinopathy of both eyes without macular edema associated with type 2 diabetes mellitus (HCC)  E11.3593 OCT, Retina - OU - Both Eyes    Intravitreal Injection, Pharmacologic Agent - OD - Right Eye    Bevacizumab (AVASTIN) SOLN 1.25 mg    2. Vitreous hemorrhage due to proliferative diabetic retinopathy (HCC)  EK27.0623Intravitreal Injection, Pharmacologic Agent - OD - Right Eye   H43.10 Bevacizumab (AVASTIN) SOLN  1.25 mg    3. Essential hypertension  I10     4. Hypertensive retinopathy of both eyes  H35.033     5. Pseudophakia, both eyes  Z96.1      1,2. Proliferative diabetic retinopathy OU, w/ VH OD - s/p IVA OD #1 (11.28.23) - BCVA 20/40 OD, 20/25 OS - exam shows diffuse VH OD -- improving, OS with scattered MA, DBH  and mild preretinal heme - FA (11.28.23) shows OD: Multiple patches NVE nasal hemisphere. Scattered blockage from diffuse VH. OS: Scattered patches of NVE greatest nasal hemisphere. Large area of vascular non profusion, nasal periphery. **pt will need PRP OU** - OCT shows NFP, no IRF/SRF OU, OD: +vit opacities improving; OS: partial PVD, PRF over disc -- stable - s/p PRP OS (12.12.23) - discussed findings and prognosis - recommend IVA OD #2 today, 12.28.23 - pt wishes to proceed with injection - RBA of procedure discussed, questions answered - informed consent obtained and signed for Avastin, 11.28.23 (OD) - see procedure note - VH precautions reviewed -- minimize activities, keep head elevated, avoid ASA/NSAIDs/blood thinners as able  - f/u week 2-3 weeks for PRP OD, DFE, OCT; 4 wks for possible injection   3,4. Hypertensive retinopathy OU - discussed importance of tight BP control - monitor  5. Pseudophakia OU  - s/p CE/IOL OU (Dr. Marvel Fetter)  - IOLs in good position, doing well  - monitor   Ophthalmic Meds Ordered this visit:  Meds ordered this encounter  Medications   Bevacizumab (AVASTIN) SOLN 1.25 mg     Return for 2-3 wks PDR OU, PRP OD, DFE, OCT .  There are no Patient Instructions on file for this visit.   Explained the diagnoses, plan, and follow up with the patient and they expressed understanding.  Patient expressed understanding of the importance of proper follow up care.   This document serves as a record of services personally performed by Gardiner Sleeper, MD, PhD. It was created on their behalf by Orvan Falconer, an ophthalmic technician. The creation of this record is the provider's dictation and/or activities during the visit.    Electronically signed by: Orvan Falconer, OA, 07/29/22  4:54 PM  Gardiner Sleeper, M.D., Ph.D. Diseases & Surgery of the Retina and Vitreous [facility]  I have reviewed the above documentation for accuracy and  completeness, and I agree with the above. Gardiner Sleeper, M.D., Ph.D. 07/29/22 4:55 PM   Abbreviations: M myopia (nearsighted); A astigmatism; H hyperopia (farsighted); P presbyopia; Mrx spectacle prescription;  CTL contact lenses; OD right eye; OS left eye; OU both eyes  XT exotropia; ET esotropia; PEK punctate epithelial keratitis; PEE punctate epithelial erosions; DES dry eye syndrome; MGD meibomian gland dysfunction; ATs artificial tears; PFAT's preservative free artificial tears; [place] nuclear sclerotic cataract; PSC posterior subcapsular cataract; ERM epi-retinal membrane; PVD posterior vitreous detachment; RD retinal detachment; DM diabetes mellitus; DR diabetic retinopathy; NPDR non-proliferative diabetic retinopathy; PDR proliferative diabetic retinopathy; CSME clinically significant macular edema; DME diabetic macular edema; dbh dot blot hemorrhages; CWS cotton wool spot; POAG primary open angle glaucoma; C/D cup-to-disc ratio; HVF humphrey visual field; GVF goldmann visual field; OCT optical coherence tomography; IOP intraocular pressure; BRVO Branch retinal vein occlusion; CRVO central retinal vein occlusion; CRAO central retinal artery occlusion; BRAO branch retinal artery occlusion; RT retinal tear; SB scleral buckle; PPV pars plana vitrectomy; VH Vitreous hemorrhage; PRP panretinal laser photocoagulation; IVK intravitreal kenalog; VMT vitreomacular traction; MH Macular hole;  NVD neovascularization  of the disc; NVE neovascularization elsewhere; AREDS age related eye disease study; ARMD age related macular degeneration; POAG primary open angle glaucoma; EBMD epithelial/anterior basement membrane dystrophy; ACIOL anterior chamber intraocular lens; IOL intraocular lens; PCIOL posterior chamber intraocular lens; Phaco/IOL phacoemulsification with intraocular lens placement; [place] photorefractive keratectomy; LASIK laser assisted in situ keratomileusis; HTN hypertension; DM diabetes mellitus; COPD chronic  obstructive pulmonary disease

## 2022-07-29 ENCOUNTER — Encounter (INDEPENDENT_AMBULATORY_CARE_PROVIDER_SITE_OTHER): Payer: Self-pay | Admitting: Ophthalmology

## 2022-07-29 ENCOUNTER — Ambulatory Visit (INDEPENDENT_AMBULATORY_CARE_PROVIDER_SITE_OTHER): Payer: Medicare HMO | Admitting: Ophthalmology

## 2022-07-29 DIAGNOSIS — E113599 Type 2 diabetes mellitus with proliferative diabetic retinopathy without macular edema, unspecified eye: Secondary | ICD-10-CM

## 2022-07-29 DIAGNOSIS — I10 Essential (primary) hypertension: Secondary | ICD-10-CM | POA: Diagnosis not present

## 2022-07-29 DIAGNOSIS — H4310 Vitreous hemorrhage, unspecified eye: Secondary | ICD-10-CM

## 2022-07-29 DIAGNOSIS — H35033 Hypertensive retinopathy, bilateral: Secondary | ICD-10-CM

## 2022-07-29 DIAGNOSIS — E113593 Type 2 diabetes mellitus with proliferative diabetic retinopathy without macular edema, bilateral: Secondary | ICD-10-CM

## 2022-07-29 DIAGNOSIS — Z961 Presence of intraocular lens: Secondary | ICD-10-CM

## 2022-07-29 MED ORDER — BEVACIZUMAB CHEMO INJECTION 1.25MG/0.05ML SYRINGE FOR KALEIDOSCOPE
1.2500 mg | INTRAVITREAL | Status: AC | PRN
  Administered 2022-07-29: 1.25 mg via INTRAVITREAL

## 2022-08-04 NOTE — Progress Notes (Signed)
[facility] Note  08/09/2022     CHIEF COMPLAINT Patient presents for Retina Follow Up   HISTORY OF PRESENT ILLNESS: Catherine Ramsey is a 77 y.o. female who presents to the clinic today for:   HPI     Retina Follow Up   Patient presents with  Diabetic Retinopathy.  This started 2 weeks ago.  Duration of 2 weeks.  I, the attending physician,  performed the HPI with the patient and updated documentation appropriately.        Comments   2 week retina follow PDR OU and PRP OD pt is reporting vision is about the same      Last edited by Bernarda Caffey, MD on 08/09/2022 11:49 AM.     Referring physician: [name], MD [place],  [place] [address]  HISTORICAL INFORMATION:   Selected notes from the MEDICAL RECORD NUMBER Referred by Dr. Ronnald Ramp for [facility] OD LEE:  Ocular Hx- PMH-    CURRENT MEDICATIONS: No current outpatient medications on file. (Ophthalmic Drugs)   No current facility-administered medications for this visit. (Ophthalmic Drugs)   Current Outpatient Medications (Other)  Medication Sig   acetaminophen (TYLENOL) 500 MG tablet Take 1,000 mg by mouth every 6 (six) hours as needed for moderate pain.   Alcohol Swabs (B-D SINGLE USE SWABS REGULAR) PADS    amLODipine (NORVASC) 10 MG tablet Take 10 mg by mouth daily.   Blood Glucose Monitoring Suppl (TRUE METRIX AIR GLUCOSE METER) w/Device KIT    CINNAMON PO Take 1 capsule by mouth daily. With chromium   empagliflozin (JARDIANCE) 10 MG TABS tablet Take 10 mg by mouth daily.   gabapentin (NEURONTIN) 300 MG capsule Take 300 mg by mouth at bedtime.   ibuprofen (ADVIL) 200 MG tablet Take 400 mg by mouth every 6 (six) hours as needed for moderate pain.   insulin aspart (NOVOLOG) 100 UNIT/ML FlexPen Inject 9 Units into the skin 3 (three) times daily as needed for high blood sugar.   Insulin Syringe-Needle U-100 31G X 5/16" 0.5 ML MISC Use one syringe twice daily DX:  250.70    KLOR-CON M20 20 MEQ tablet Take 20 mEq by mouth daily.   LANTUS SOLOSTAR 100 UNIT/ML Solostar Pen Inject 17 Units into the skin daily.   Liniments (BLUE-EMU SUPER STRENGTH) CREA Apply 1 application. topically 3 (three) times daily.   losartan-hydrochlorothiazide (HYZAAR) 50-12.5 MG tablet Take 1 tablet by mouth daily.   omeprazole (PRILOSEC) 20 MG capsule Take 20 mg by mouth daily as needed (acid reflux).   pravastatin (PRAVACHOL) 40 MG tablet Take 40 mg by mouth at bedtime.   TRUE METRIX BLOOD GLUCOSE TEST test strip    TRUEplus Lancets 33G MISC    Current Facility-Administered Medications (Other)  Medication Route   0.9 %  sodium chloride infusion Intravenous   REVIEW OF SYSTEMS: ROS   Positive for: Endocrine, Eyes Last edited by Bernarda Caffey, MD on 08/09/2022 11:49 AM.     ALLERGIES Allergies  Allergen Reactions   Lovastatin Rash   PAST MEDICAL HISTORY Past Medical History:  Diagnosis Date   Achalasia - Type II 05/08/2010   Qualifier: Diagnosis of  By: Carlean Purl MD, Tonna Boehringer E    Acute kidney injury ([place]) 02/26/2019   Candida esophagitis ([place]) 03/26/2014   COVID-19 virus detected 02/25/2019   Diabetes mellitus    type 2   Family history of adverse reaction to anesthesia    son hard to  wake up   Hypertension    Syncope 02/25/2019   Trigger ring finger of right hand 10/07/2016   Past Surgical History:  Procedure Laterality Date   ABDOMINAL HYSTERECTOMY     BREAST BIOPSY     left/benign   COLONOSCOPY  01/31/2014   ESOPHAGEAL MANOMETRY N/A 04/01/2014   Procedure: ESOPHAGEAL MANOMETRY (EM);  Surgeon: Gatha Mayer, MD;  Location: [facility];  Service: Endoscopy;  Laterality: N/A;   HELLER MYOTOMY N/A 12/07/2016   Procedure: LAPAROSCOPIC HELLER MYOTOMY, DOR FUNDOPLICATION, HIATAL HERNIA REPAIR, INTRAOPERATIVE UPPER ENDOSCOPY;  Surgeon: Jackolyn Confer, MD;  Location: [facility];  Service: General;  Laterality: N/A;   IR ANGIOGRAM PELVIS SELECTIVE OR SUPRASELECTIVE  10/20/2021   IR  RADIOLOGIST EVAL & MGMT  10/01/2021   IR RADIOLOGIST EVAL & MGMT  11/25/2021   IR TIB-PERO ART ATHEREC INC PTA MOD SED  10/15/2021   IR US GUIDE VASC ACCESS RIGHT  10/15/2021   TOTAL HIP ARTHROPLASTY     right pin then replacement   FAMILY HISTORY Family History  Problem Relation Age of Onset   Diabetes Mother        died at 100 from Covid-19   Hypertension Mother    Hyperlipidemia Mother    Breast cancer Mother    Diabetes Father    Hypertension Father    Hypertension Sister    Hypertension Sister    Colon polyps Daughter    Colon cancer Neg Hx    Esophageal cancer Neg Hx    Rectal cancer Neg Hx    Stomach cancer Neg Hx    SOCIAL HISTORY Social History   Tobacco Use   Smoking status: Never   Smokeless tobacco: Never  Vaping Use   Vaping Use: Never used  Substance Use Topics   Alcohol use: No   Drug use: No       OPHTHALMIC EXAM:  Base Eye Exam     Visual Acuity (Snellen - Linear)       Right Left   Dist [place] 20/30 -2 20/30         Tonometry (Tonopen, 10:03 AM)       Right Left   Pressure 14 13         Pupils       Pupils Dark Light Shape React APD   Right PERRL 4 3 Round Brisk None   Left PERRL 4 3 Round Brisk None         Visual Fields       Left Right    Full Full         Extraocular Movement       Right Left    Full Full         Neuro/Psych     Oriented x3: Yes   Mood/Affect: Normal         Dilation     Both eyes: 2.5% Phenylephrine @ 10:04 AM           Slit Lamp and Fundus Exam     External Exam       Right Left   External Normal Normal         Slit Lamp Exam       Right Left   Lids/Lashes Dermatochalasis - upper lid Dermatochalasis - upper lid   Conjunctiva/Sclera Melanosis, Pinguecula Melanosis, Pinguecula   Cornea 1+ Punctate epithelial erosions, Well healed temporal cataract wound 2+ Punctate epithelial erosions, Well healed temporal cataract wound   Anterior Chamber Deep and  Clear Deep and Clear    Iris Round and dilated, No NVI Round and dilated, No NVI   Lens PC IOL in good position, pigment/RBC on post optic PC IOL in good position,    Anterior Vitreous Syneresis, +RBC, diffuse blood stained vit-clearing centrally and settling inferiorly. Syneresis         Fundus Exam       Right Left   Disc Pink and sharp, mild pallor, sharp rim, mild PPP Mild pallor, sharp rim, Mild PPP   C/D Ratio 0.2 0.3   Macula Hazy view-- improved good foveal reflext, no heme or edema Good foveal reflex, mild ERM, scattered MA, no edema   Vessels Attenuated, tortuous, severe attenuation IN arcades Attenuated, tortuous, +NV   Periphery Attached, scatterd MA/DBH, focal fibrosis @ 1130 Attached, scattered MA/DBH, preret heme inferiorly--improving, Early fibrotic NVE nasal midzone, good 360 PRP changes           IMAGING AND PROCEDURES  Imaging and Procedures for 08/09/2022  OCT, Retina - OU - Both Eyes       Right Eye Quality was poor. Central Foveal Thickness: 240. Progression has been stable. Findings include normal foveal contour, no IRF, no SRF (trace vitreous opacities).   Left Eye Quality was good. Central Foveal Thickness: 262. Progression has been stable. Findings include normal foveal contour, no IRF, no SRF, preretinal fibrosis (Partial PVD, PRF over disc).   Notes *Images captured and stored on drive  Diagnosis / Impression:  NFP, no IRF/SRF OU OD: trace vitreous opacities OS: Partial PVD, PRF over disc -- stable  Clinical management:  See below  Abbreviations: NFP - Normal foveal profile. CME - cystoid macular edema. PED - pigment epithelial detachment. IRF - intraretinal fluid. SRF - subretinal fluid. EZ - ellipsoid zone. ERM - epiretinal membrane. ORA - outer retinal atrophy. ORT - outer retinal tubulation. SRHM - subretinal hyper-reflective material. IRHM - intraretinal hyper-reflective material      Panretinal Photocoagulation - OD - Right Eye       LASER PROCEDURE  NOTE  Diagnosis:   Proliferative Diabetic Retinopathy, RIGHT EYE  Procedure:  Pan-retinal photocoagulation using slit lamp laser, RIGHT EYE  Anesthesia:  Topical  Surgeon: Bernarda Caffey, MD, PhD   Informed consent obtained, operative eye marked, and time out performed prior to initiation of laser.   Lumenis [id number] slit lamp laser Pattern: 3x3 square Power: 320 mW Duration: 40 msec  Spot size: 200 microns  # spots: 3086 spots  Complications: None.  Notes: vitreous heme obscuring view and preventing laser up take inferiorly and scattered focal areas  RTC: Jan 25 or later -- DFE/OCT, possible injxn  Patient tolerated the procedure well and received written and verbal post-procedure care information/education.            ASSESSMENT/PLAN:    ICD-10-CM   1. Proliferative diabetic retinopathy of both eyes without macular edema associated with type 2 diabetes mellitus (HCC)  E11.3593 OCT, Retina - OU - Both Eyes    Panretinal Photocoagulation - OD - Right Eye    2. Vitreous hemorrhage due to proliferative diabetic retinopathy ([place])  E11.3599    H43.10     3. Essential hypertension  I10     4. Hypertensive retinopathy of both eyes  H35.033     5. Pseudophakia, both eyes  Z96.1      1,2. Proliferative diabetic retinopathy OU, w/ VH OD - s/p IVA OD #1 (11.28.23), #2 (12.28.23) - s/p PRP OS (12.12.23) - BCVA 20/30  OU - exam shows diffuse VH OD -- improving, OS with scattered MA, DBH and mild preretinal heme - FA (11.28.23) shows OD: Multiple patches NVE nasal hemisphere. Scattered blockage from diffuse VH. OS: Scattered patches of NVE greatest nasal hemisphere. Large area of vascular non profusion, nasal periphery. **pt will need PRP OU** - OCT shows NFP, no IRF/SRF OU, OD: trace vit opacities; OS: partial PVD, PRF over disc -- stable - discussed findings and prognosis - recommend PRP OD today, 01.08.24 - pt wishes to proceed with laser - RBA of procedure discussed,  questions answered - informed consent obtained and signed for Avastin, 11.28.23 (OD) - see procedure note - VH precautions reviewed -- minimize activities, keep head elevated, avoid ASA/NSAIDs/blood thinners as able  - f/u January 25 or later, DFE, OCT   3,4. Hypertensive retinopathy OU - discussed importance of tight BP control - monitor  5. Pseudophakia OU  - s/p CE/IOL OU (Dr. Tae Fetter)  - IOLs in good position, doing well  - monitor  Ophthalmic Meds Ordered this visit:  No orders of the defined types were placed in this encounter.    Return for f/u January 25 or later, PDR OU, DFE, OCT.  There are no Patient Instructions on file for this visit.   Explained the diagnoses, plan, and follow up with the patient and they expressed understanding.  Patient expressed understanding of the importance of proper follow up care.   This document serves as a record of services personally performed by Gardiner Sleeper, MD, PhD. It was created on their behalf by Roselee Nova, COMT. The creation of this record is the provider's dictation and/or activities during the visit.  Electronically signed by: Roselee Nova, COMT 08/09/22 11:51 AM  This document serves as a record of services personally performed by Gardiner Sleeper, MD, PhD. It was created on their behalf by San Jetty. Owens Shark, OA an ophthalmic technician. The creation of this record is the provider's dictation and/or activities during the visit.    Electronically signed by: San Jetty. Owens Shark, [place] 01.08.2024 11:51 AM  Gardiner Sleeper, M.D., Ph.D. Diseases & Surgery of the Retina and Vitreous [facility]  I have reviewed the above documentation for accuracy and completeness, and I agree with the above. Gardiner Sleeper, M.D., Ph.D. 08/09/22 11:52 AM  Abbreviations: M myopia (nearsighted); A astigmatism; H hyperopia (farsighted); P presbyopia; Mrx spectacle prescription;  CTL contact lenses; OD right eye; OS left eye; OU both  eyes  XT exotropia; ET esotropia; PEK punctate epithelial keratitis; PEE punctate epithelial erosions; DES dry eye syndrome; MGD meibomian gland dysfunction; ATs artificial tears; PFAT's preservative free artificial tears; [place] nuclear sclerotic cataract; PSC posterior subcapsular cataract; ERM epi-retinal membrane; PVD posterior vitreous detachment; RD retinal detachment; DM diabetes mellitus; DR diabetic retinopathy; NPDR non-proliferative diabetic retinopathy; PDR proliferative diabetic retinopathy; CSME clinically significant macular edema; DME diabetic macular edema; dbh dot blot hemorrhages; CWS cotton wool spot; POAG primary open angle glaucoma; C/D cup-to-disc ratio; HVF humphrey visual field; GVF goldmann visual field; OCT optical coherence tomography; IOP intraocular pressure; BRVO Branch retinal vein occlusion; CRVO central retinal vein occlusion; CRAO central retinal artery occlusion; BRAO branch retinal artery occlusion; RT retinal tear; SB scleral buckle; PPV pars plana vitrectomy; VH Vitreous hemorrhage; PRP panretinal laser photocoagulation; IVK intravitreal kenalog; VMT vitreomacular traction; MH Macular hole;  NVD neovascularization of the disc; NVE neovascularization elsewhere; AREDS age related eye disease study; ARMD age related macular degeneration; POAG primary open angle  glaucoma; EBMD epithelial/anterior basement membrane dystrophy; ACIOL anterior chamber intraocular lens; IOL intraocular lens; PCIOL posterior chamber intraocular lens; Phaco/IOL phacoemulsification with intraocular lens placement; [place] photorefractive keratectomy; LASIK laser assisted in situ keratomileusis; HTN hypertension; DM diabetes mellitus; COPD chronic obstructive pulmonary disease

## 2022-08-09 ENCOUNTER — Ambulatory Visit (INDEPENDENT_AMBULATORY_CARE_PROVIDER_SITE_OTHER): Payer: Medicare HMO | Admitting: Ophthalmology

## 2022-08-09 ENCOUNTER — Encounter (INDEPENDENT_AMBULATORY_CARE_PROVIDER_SITE_OTHER): Payer: Self-pay | Admitting: Ophthalmology

## 2022-08-09 DIAGNOSIS — E113599 Type 2 diabetes mellitus with proliferative diabetic retinopathy without macular edema, unspecified eye: Secondary | ICD-10-CM

## 2022-08-09 DIAGNOSIS — H35033 Hypertensive retinopathy, bilateral: Secondary | ICD-10-CM

## 2022-08-09 DIAGNOSIS — I10 Essential (primary) hypertension: Secondary | ICD-10-CM

## 2022-08-09 DIAGNOSIS — E113593 Type 2 diabetes mellitus with proliferative diabetic retinopathy without macular edema, bilateral: Secondary | ICD-10-CM | POA: Diagnosis not present

## 2022-08-09 DIAGNOSIS — Z961 Presence of intraocular lens: Secondary | ICD-10-CM

## 2022-08-12 ENCOUNTER — Telehealth: Payer: Self-pay

## 2022-08-12 NOTE — Telephone Encounter (Signed)
Rescheduled patient for 2-21/24 @ 10:30 AM for a endo/colon and blocked one spot. I spoke with the patient and she said this would be fine.

## 2022-08-12 NOTE — Telephone Encounter (Signed)
You are correct she needs an EGD and a colonoscopy. Either Block 1 other spot and make her a double at night or move her time please.  There are plenty of openings it looks like.  Thank you for picking this up.

## 2022-08-12 NOTE — Telephone Encounter (Signed)
Prepping this patients chart prior to her procedure. Please confirm if you are wanting a EGD/COLON for this patient or just a colonoscopy. I seen in your notes from her past office visits she was suppose to have a double that end up being cancelled due to other issues. As of now she in currently only schedule for a colonoscopy with you on 09/22/2022. Please advise.  Thank you, Sammie Bench Previsit

## 2022-08-24 NOTE — Progress Notes (Shared)
[facility] Note  08/30/2022     CHIEF COMPLAINT Patient presents for No chief complaint on file.   HISTORY OF PRESENT ILLNESS: Catherine Ramsey is a 77 y.o. female who presents to the clinic today for:     Referring physician: [name], MD [place],  [place] [address]  HISTORICAL INFORMATION:   Selected notes from the MEDICAL RECORD NUMBER Referred by Dr. Ronnald Ramp for [facility] OD LEE:  Ocular Hx- PMH-    CURRENT MEDICATIONS: No current outpatient medications on file. (Ophthalmic Drugs)   No current facility-administered medications for this visit. (Ophthalmic Drugs)   Current Outpatient Medications (Other)  Medication Sig   acetaminophen (TYLENOL) 500 MG tablet Take 1,000 mg by mouth every 6 (six) hours as needed for moderate pain.   Alcohol Swabs (B-D SINGLE USE SWABS REGULAR) PADS    amLODipine (NORVASC) 10 MG tablet Take 10 mg by mouth daily.   Blood Glucose Monitoring Suppl (TRUE METRIX AIR GLUCOSE METER) w/Device KIT    CINNAMON PO Take 1 capsule by mouth daily. With chromium   empagliflozin (JARDIANCE) 10 MG TABS tablet Take 10 mg by mouth daily.   gabapentin (NEURONTIN) 300 MG capsule Take 300 mg by mouth at bedtime.   ibuprofen (ADVIL) 200 MG tablet Take 400 mg by mouth every 6 (six) hours as needed for moderate pain.   insulin aspart (NOVOLOG) 100 UNIT/ML FlexPen Inject 9 Units into the skin 3 (three) times daily as needed for high blood sugar.   Insulin Syringe-Needle U-100 31G X 5/16" 0.5 ML MISC Use one syringe twice daily DX:  250.70   KLOR-CON M20 20 MEQ tablet Take 20 mEq by mouth daily.   LANTUS SOLOSTAR 100 UNIT/ML Solostar Pen Inject 17 Units into the skin daily.   Liniments (BLUE-EMU SUPER STRENGTH) CREA Apply 1 application. topically 3 (three) times daily.   losartan-hydrochlorothiazide (HYZAAR) 50-12.5 MG tablet Take 1 tablet by mouth daily.   omeprazole (PRILOSEC) 20 MG capsule Take 20 mg by mouth daily as  needed (acid reflux).   pravastatin (PRAVACHOL) 40 MG tablet Take 40 mg by mouth at bedtime.   TRUE METRIX BLOOD GLUCOSE TEST test strip    TRUEplus Lancets 33G MISC    Current Facility-Administered Medications (Other)  Medication Route   0.9 %  sodium chloride infusion Intravenous   REVIEW OF SYSTEMS:   ALLERGIES Allergies  Allergen Reactions   Lovastatin Rash   PAST MEDICAL HISTORY Past Medical History:  Diagnosis Date   Achalasia - Type II 05/08/2010   Qualifier: Diagnosis of  By: Carlean Purl MD, Tonna Boehringer E    Acute kidney injury ([place]) 02/26/2019   Candida esophagitis ([place]) 03/26/2014   COVID-19 virus detected 02/25/2019   Diabetes mellitus    type 2   Family history of adverse reaction to anesthesia    son hard to wake up   Hypertension    Syncope 02/25/2019   Trigger ring finger of right hand 10/07/2016   Past Surgical History:  Procedure Laterality Date   ABDOMINAL HYSTERECTOMY     BREAST BIOPSY     left/benign   COLONOSCOPY  01/31/2014   ESOPHAGEAL MANOMETRY N/A 04/01/2014   Procedure: ESOPHAGEAL MANOMETRY (EM);  Surgeon: Gatha Mayer, MD;  [facility];  Service: Endoscopy;  Laterality: N/A;   HELLER MYOTOMY N/A 12/07/2016   Procedure: LAPAROSCOPIC HELLER MYOTOMY, DOR FUNDOPLICATION, HIATAL HERNIA REPAIR, INTRAOPERATIVE UPPER ENDOSCOPY;  Surgeon: Jackolyn Confer, MD;  Location: [facility];  Service: General;  Laterality: N/A;   IR ANGIOGRAM PELVIS SELECTIVE OR SUPRASELECTIVE  10/20/2021   IR RADIOLOGIST EVAL & MGMT  10/01/2021   IR RADIOLOGIST EVAL & MGMT  11/25/2021   IR TIB-PERO ART ATHEREC INC PTA MOD SED  10/15/2021   IR US GUIDE VASC ACCESS RIGHT  10/15/2021   TOTAL HIP ARTHROPLASTY     right pin then replacement   FAMILY HISTORY Family History  Problem Relation Age of Onset   Diabetes Mother        died at 48 from Covid-19   Hypertension Mother    Hyperlipidemia Mother    Breast cancer Mother    Diabetes Father    Hypertension Father    Hypertension  Sister    Hypertension Sister    Colon polyps Daughter    Colon cancer Neg Hx    Esophageal cancer Neg Hx    Rectal cancer Neg Hx    Stomach cancer Neg Hx    SOCIAL HISTORY Social History   Tobacco Use   Smoking status: Never   Smokeless tobacco: Never  Vaping Use   Vaping Use: Never used  Substance Use Topics   Alcohol use: No   Drug use: No       OPHTHALMIC EXAM:  Not recorded    IMAGING AND PROCEDURES  Imaging and Procedures for 08/30/2022          ASSESSMENT/PLAN:  No diagnosis found.  1,2. Proliferative diabetic retinopathy OU, w/ VH OD - s/p IVA OD #1 (11.28.23), #2 (12.28.23) - s/p PRP OS (12.12.23) - BCVA 20/30 OU - exam shows diffuse VH OD -- improving, OS with scattered MA, DBH and mild preretinal heme - FA (11.28.23) shows OD: Multiple patches NVE nasal hemisphere. Scattered blockage from diffuse VH. OS: Scattered patches of NVE greatest nasal hemisphere. Large area of vascular non profusion, nasal periphery. **pt will need PRP OU** - OCT shows NFP, no IRF/SRF OU, OD: trace vit opacities; OS: partial PVD, PRF over disc -- stable - discussed findings and prognosis - s/p PRP OD (01.08.24) - recommend IVA OD today, 01.29.24 - pt wishes to proceed with injection - RBA of procedure discussed, questions answered - informed consent obtained and signed for Avastin, 11.28.23 (OD) - see procedure note  - VH precautions reviewed -- minimize activities, keep head elevated, avoid ASA/NSAIDs/blood thinners as able  - f/u January 25 or later, DFE, OCT   3,4. Hypertensive retinopathy OU - discussed importance of tight BP control - monitor  5. Pseudophakia OU  - s/p CE/IOL OU (Dr. Jaira Fetter)  - IOLs in good position, doing well  - monitor   Ophthalmic Meds Ordered this visit:  No orders of the defined types were placed in this encounter.    No follow-ups on file.  There are no Patient Instructions on file for this visit.   Explained the diagnoses, plan,  and follow up with the patient and they expressed understanding.  Patient expressed understanding of the importance of proper follow up care.   This document serves as a record of services personally performed by Gardiner Sleeper, MD, PhD. It was created on their behalf by Orvan Falconer, an ophthalmic technician. The creation of this record is the provider's dictation and/or activities during the visit.    Electronically signed by: Orvan Falconer, OA, 08/24/22  12:24 PM   Gardiner Sleeper, M.D., Ph.D. Diseases & Surgery of the Retina and Vitreous [facility]   Abbreviations:  M myopia (nearsighted); A astigmatism; H hyperopia (farsighted); P presbyopia; Mrx spectacle prescription;  CTL contact lenses; OD right eye; OS left eye; OU both eyes  XT exotropia; ET esotropia; PEK punctate epithelial keratitis; PEE punctate epithelial erosions; DES dry eye syndrome; MGD meibomian gland dysfunction; ATs artificial tears; PFAT's preservative free artificial tears; [place] nuclear sclerotic cataract; PSC posterior subcapsular cataract; ERM epi-retinal membrane; PVD posterior vitreous detachment; RD retinal detachment; DM diabetes mellitus; DR diabetic retinopathy; NPDR non-proliferative diabetic retinopathy; PDR proliferative diabetic retinopathy; CSME clinically significant macular edema; DME diabetic macular edema; dbh dot blot hemorrhages; CWS cotton wool spot; POAG primary open angle glaucoma; C/D cup-to-disc ratio; HVF humphrey visual field; GVF goldmann visual field; OCT optical coherence tomography; IOP intraocular pressure; BRVO Branch retinal vein occlusion; CRVO central retinal vein occlusion; CRAO central retinal artery occlusion; BRAO branch retinal artery occlusion; RT retinal tear; SB scleral buckle; PPV pars plana vitrectomy; VH Vitreous hemorrhage; PRP panretinal laser photocoagulation; IVK intravitreal kenalog; VMT vitreomacular traction; MH Macular hole;  NVD  neovascularization of the disc; NVE neovascularization elsewhere; AREDS age related eye disease study; ARMD age related macular degeneration; POAG primary open angle glaucoma; EBMD epithelial/anterior basement membrane dystrophy; ACIOL anterior chamber intraocular lens; IOL intraocular lens; PCIOL posterior chamber intraocular lens; Phaco/IOL phacoemulsification with intraocular lens placement; [place] photorefractive keratectomy; LASIK laser assisted in situ keratomileusis; HTN hypertension; DM diabetes mellitus; COPD chronic obstructive pulmonary disease

## 2022-08-25 ENCOUNTER — Ambulatory Visit (AMBULATORY_SURGERY_CENTER): Payer: Medicare HMO | Admitting: *Deleted

## 2022-08-25 VITALS — Ht 67.0 in | Wt 182.0 lb

## 2022-08-25 DIAGNOSIS — R1319 Other dysphagia: Secondary | ICD-10-CM

## 2022-08-25 DIAGNOSIS — K220 Achalasia of cardia: Secondary | ICD-10-CM

## 2022-08-25 DIAGNOSIS — Z86010 Personal history of colonic polyps: Secondary | ICD-10-CM

## 2022-08-25 NOTE — Progress Notes (Signed)
No egg or soy allergy known to patient  No issues known to pt with past sedation with any surgeries or procedures Patient denies ever being told they had issues or difficulty with intubation  No FH of Malignant Hyperthermia Pt is not on diet pills Pt is not on  home 02  Pt is not on blood thinners  Pt denies issues with constipation  No A fib or A flutter Have any cardiac testing pending--no Pt instructed to use [email or web address] or GoodRx for a price reduction on prep   Pt states that she is not taking any diabetic medication at this time but it was prescribed.  Advised her of instructions regarding insulin and jardiance if she decided to start meds prior to procedure.     Patient's chart reviewed by Osvaldo Angst CNRA prior to previsit and patient appropriate for the [place].  Previsit completed and red dot placed by patient's name on their procedure day (on provider's schedule).

## 2022-08-30 ENCOUNTER — Encounter (INDEPENDENT_AMBULATORY_CARE_PROVIDER_SITE_OTHER): Payer: Medicare HMO | Admitting: Ophthalmology

## 2022-08-30 DIAGNOSIS — I10 Essential (primary) hypertension: Secondary | ICD-10-CM

## 2022-08-30 DIAGNOSIS — Z961 Presence of intraocular lens: Secondary | ICD-10-CM

## 2022-08-30 DIAGNOSIS — H35033 Hypertensive retinopathy, bilateral: Secondary | ICD-10-CM

## 2022-08-30 DIAGNOSIS — H4310 Vitreous hemorrhage, unspecified eye: Secondary | ICD-10-CM

## 2022-08-30 DIAGNOSIS — E113593 Type 2 diabetes mellitus with proliferative diabetic retinopathy without macular edema, bilateral: Secondary | ICD-10-CM

## 2022-09-01 ENCOUNTER — Ambulatory Visit: Payer: Medicare HMO

## 2022-09-01 DIAGNOSIS — M2042 Other hammer toe(s) (acquired), left foot: Secondary | ICD-10-CM

## 2022-09-01 DIAGNOSIS — E1142 Type 2 diabetes mellitus with diabetic polyneuropathy: Secondary | ICD-10-CM

## 2022-09-01 NOTE — Progress Notes (Signed)
Patient presents to the office today for diabetic shoe and insole measuring.  Patient was measured with brannock device to determine size and width for 1 pair of extra depth shoes and foam casted for 3 pair of insoles.   ABN signed.   Documentation of medical necessity will be sent to patient's treating diabetic doctor to verify and sign.   Patient's diabetic provider:Paterson, Ermalene Searing, MD   NPI: [id number]   Shoes and insoles will be ordered at that time and patient will be notified for an appointment for fitting when they arrive.   Brannock measurement: 11.5 W  Patient shoe selection-   1st   Shoe choice:   846 orthofeet  Shoe size ordered: 12 W

## 2022-09-06 NOTE — Addendum Note (Signed)
Addended by: Gardiner Barefoot on: 09/06/2022 12:07 PM   Modules accepted: Orders

## 2022-09-09 ENCOUNTER — Encounter: Payer: Self-pay | Admitting: Internal Medicine

## 2022-09-22 ENCOUNTER — Ambulatory Visit (AMBULATORY_SURGERY_CENTER): Payer: Medicare HMO | Admitting: Internal Medicine

## 2022-09-22 ENCOUNTER — Encounter: Payer: Self-pay | Admitting: Internal Medicine

## 2022-09-22 VITALS — BP 143/71 | HR 50 | Temp 96.9°F | Resp 13 | Ht 67.0 in | Wt 182.0 lb

## 2022-09-22 DIAGNOSIS — K220 Achalasia of cardia: Secondary | ICD-10-CM | POA: Diagnosis not present

## 2022-09-22 DIAGNOSIS — Z86010 Personal history of colonic polyps: Secondary | ICD-10-CM

## 2022-09-22 DIAGNOSIS — Z1211 Encounter for screening for malignant neoplasm of colon: Secondary | ICD-10-CM | POA: Diagnosis not present

## 2022-09-22 DIAGNOSIS — R1310 Dysphagia, unspecified: Secondary | ICD-10-CM | POA: Diagnosis not present

## 2022-09-22 DIAGNOSIS — E119 Type 2 diabetes mellitus without complications: Secondary | ICD-10-CM | POA: Diagnosis not present

## 2022-09-22 DIAGNOSIS — Z09 Encounter for follow-up examination after completed treatment for conditions other than malignant neoplasm: Secondary | ICD-10-CM

## 2022-09-22 DIAGNOSIS — R1319 Other dysphagia: Secondary | ICD-10-CM | POA: Diagnosis not present

## 2022-09-22 DIAGNOSIS — I10 Essential (primary) hypertension: Secondary | ICD-10-CM | POA: Diagnosis not present

## 2022-09-22 MED ORDER — SODIUM CHLORIDE 0.9 % IV SOLN: 500.0000 mL | Freq: Once | INTRAVENOUS | Status: DC

## 2022-09-22 NOTE — Patient Instructions (Addendum)
I dilated the esophagus - let's see if that helps you swallow better. It may be that you have to cut meat very small and chew well.  No polyps today on colonoscopy. Since next routine exam would be in 5 years - I am not recommending a routine repeat at age 77 but we would do one if there were problems requiring it.  I appreciate the opportunity to care for you. Gatha Mayer, MD, [facility]  Continue present medications.   YOU HAD AN ENDOSCOPIC PROCEDURE TODAY AT [facility]:   Refer to the procedure report that was given to you for any specific questions about what was found during the examination.  If the procedure report does not answer your questions, please call your gastroenterologist to clarify.  If you requested that your care partner not be given the details of your procedure findings, then the procedure report has been included in a sealed envelope for you to review at your convenience later.  YOU SHOULD EXPECT: Some feelings of bloating in the abdomen. Passage of more gas than usual.  Walking can help get rid of the air that was put into your GI tract during the procedure and reduce the bloating. If you had a lower endoscopy (such as a colonoscopy or flexible sigmoidoscopy) you may notice spotting of blood in your stool or on the toilet paper. If you underwent a bowel prep for your procedure, you may not have a normal bowel movement for a few days.  Please Note:  You might notice some irritation and congestion in your nose or some drainage.  This is from the oxygen used during your procedure.  There is no need for concern and it should clear up in a day or so.  SYMPTOMS TO REPORT IMMEDIATELY:  Following lower endoscopy (colonoscopy or flexible sigmoidoscopy):  Excessive amounts of blood in the stool  Significant tenderness or worsening of abdominal pains  Swelling of the abdomen that is new, acute  Fever of 100F or higher  Following upper endoscopy (EGD)  Vomiting of  blood or coffee ground material  New chest pain or pain under the shoulder blades  Painful or persistently difficult swallowing  New shortness of breath  Fever of 100F or higher  Black, tarry-looking stools  For urgent or emergent issues, a gastroenterologist can be reached at any hour by calling [phone]. Do not use MyChart messaging for urgent concerns.    DIET:  We do recommend a small meal at first, but then you may proceed to your regular diet.  Drink plenty of fluids but you should avoid alcoholic beverages for 24 hours.  ACTIVITY:  You should plan to take it easy for the rest of today and you should NOT DRIVE or use heavy machinery until tomorrow (because of the sedation medicines used during the test).    FOLLOW UP: Our staff will call the number listed on your records the next business day following your procedure.  We will call around 7:15- 8:00 am to check on you and address any questions or concerns that you may have regarding the information given to you following your procedure. If we do not reach you, we will leave a message.     If any biopsies were taken you will be contacted by phone or by letter within the next 1-3 weeks.  Please call us at [phone] if you have not heard about the biopsies in 3 weeks.    SIGNATURES/CONFIDENTIALITY: You and/or your  care partner have signed paperwork which will be entered into your electronic medical record.  These signatures attest to the fact that that the information above on your After Visit Summary has been reviewed and is understood.  Full responsibility of the confidentiality of this discharge information lies with you and/or your care-partner.

## 2022-09-22 NOTE — Op Note (Signed)
[place] Patient Name: Catherine Ramsey Procedure Date: 09/22/2022 10:18 AM MRN: [id number] Endoscopist: Gatha Mayer , MD, [phone] Age: 77 Referring MD:  Date of Birth: 08/20/1945 Gender: Female Account #: [id number] Procedure:                Upper GI endoscopy Indications:              Dysphagia Medicines:                Monitored Anesthesia Care Procedure:                Pre-Anesthesia Assessment:                           - Prior to the procedure, a History and Physical                            was performed, and patient medications and                            allergies were reviewed. The patient's tolerance of                            previous anesthesia was also reviewed. The risks                            and benefits of the procedure and the sedation                            options and risks were discussed with the patient.                            All questions were answered, and informed consent                            was obtained. Prior Anticoagulants: The patient has                            taken no anticoagulant or antiplatelet agents. ASA                            Grade Assessment: III - A patient with severe                            systemic disease. After reviewing the risks and                            benefits, the patient was deemed in satisfactory                            condition to undergo the procedure.                           After obtaining informed consent, the endoscope was  passed under direct vision. Throughout the                            procedure, the patient's blood pressure, pulse, and                            oxygen saturations were monitored continuously. The                            Olympus Scope [id number] was introduced through the                            mouth, and advanced to the second part of duodenum.                            The upper GI endoscopy was  accomplished without                            difficulty. The patient tolerated the procedure                            well. Scope In: Scope Out: Findings:                 The lumen of the esophagus was mildly dilated.                           One benign-appearing, intrinsic moderate stenosis                            was found at the gastroesophageal junction. The                            stenosis was traversed. A TTS dilator was passed                            through the scope. Dilation with an 18-19-20 mm                            balloon dilator was performed to 20 mm. The                            dilation site was examined and showed no change.                            Estimated blood loss: none.                           Evidence of a Dor fundoplication was found at the                            gastroesophageal junction. The wrap appeared intact.                           The exam  was otherwise without abnormality.                           The cardia and gastric fundus were normal on                            retroflexion. Complications:            No immediate complications. Estimated Blood Loss:     Estimated blood loss: none. Impression:               - Dilation in the entire esophagus. Mild (achalasia                            type II)                           - Benign-appearing esophageal stenosis. Dilated. No                            visible effect                           - A Dor fundoplication was found. The wrap appears                            intact.                           - The examination was otherwise normal.                           - No specimens collected. Recommendation:           - Patient has a contact number available for                            emergencies. The signs and symptoms of potential                            delayed complications were discussed with the                            patient. Return to normal  activities tomorrow.                            Written discharge instructions were provided to the                            patient.                           - Resume previous diet. She may need to                            furtherreduce size of meat boluses and chew well,  vs elimnate from diet                           - Continue present medications.                           - See the other procedure note for documentation of                            additional recommendations. Gatha Mayer, MD 09/22/2022 11:20:24 AM This report has been signed electronically.

## 2022-09-22 NOTE — Progress Notes (Signed)
Uneventful anesthetic. Report to pacu rn. Vss. Care resumed by rn.

## 2022-09-22 NOTE — Progress Notes (Signed)
VS completed by CW.   Pt's states no medical or surgical changes since previsit or office visit.  

## 2022-09-22 NOTE — Op Note (Signed)
[place] Patient Name: Catherine Ramsey Procedure Date: 09/22/2022 10:09 AM MRN: [id number] Endoscopist: Gatha Mayer , MD, [phone] Age: 77 Referring MD:  Date of Birth: 1945-12-07 Gender: Female Account #: [id number] Procedure:                Colonoscopy Indications:              Surveillance: Personal history of adenomatous                            polyps on last colonoscopy > 3 years ago, Last                            colonoscopy: 2020 Medicines:                Monitored Anesthesia Care Procedure:                Pre-Anesthesia Assessment:                           - Prior to the procedure, a History and Physical                            was performed, and patient medications and                            allergies were reviewed. The patient's tolerance of                            previous anesthesia was also reviewed. The risks                            and benefits of the procedure and the sedation                            options and risks were discussed with the patient.                            All questions were answered, and informed consent                            was obtained. Prior Anticoagulants: The patient has                            taken no anticoagulant or antiplatelet agents. ASA                            Grade Assessment: III - A patient with severe                            systemic disease. After reviewing the risks and                            benefits, the patient was deemed in satisfactory  condition to undergo the procedure.                           After obtaining informed consent, the colonoscope                            was passed under direct vision. Throughout the                            procedure, the patient's blood pressure, pulse, and                            oxygen saturations were monitored continuously. The                            Olympus CF-[id number] ([id number]) Colonoscope  was                            introduced through the anus and advanced to the the                            cecum, identified by appendiceal orifice and                            ileocecal valve. The colonoscopy was somewhat                            difficult due to significant looping. Successful                            completion of the procedure was aided by abdominal                            binder application. The patient tolerated the                            procedure well. The quality of the bowel                            preparation was good. The ileocecal valve,                            appendiceal orifice, and rectum were photographed.                            The bowel preparation used was Miralax via split                            dose instruction. Scope In: 10:46:53 AM Scope Out: [id number] Scope Withdrawal Time: 0 hours 11 minutes 57 seconds  Total Procedure Duration: 0 hours 21 minutes 49 seconds  Findings:                 The perianal and digital rectal examinations were  normal.                           The entire examined colon appeared normal on direct                            and retroflexion views. Complications:            No immediate complications. Estimated Blood Loss:     Estimated blood loss: none. Impression:               - The entire examined colon is normal on direct and                            retroflexion views. Abdominal binder used to                            overcome known looping issue                           - No specimens collected.                           - Personal history of colonic polyps. 2007 - 4                            diminutive polyps TV adenoma, tubular adenoma                           2011 - 5 diminutive polyps - adenomas, serrated                            polyp                           01/31/2014 2 diminutive right colon polyps removed -                            not  pre-cancerous -                           04/30/2019 7 diminutive polyps removed all adenomas Recommendation:           - Patient has a contact number available for                            emergencies. The signs and symptoms of potential                            delayed complications were discussed with the                            patient. Return to normal activities tomorrow.                            Written discharge instructions were provided to the  patient.                           - Resume previous diet.                           - Continue present medications.                           - No routine repeat colonoscopy due to age and the                            absence of colonic polyps. If she should require                            colonoscopy would apply abdominal binder to                            facilitate scope p[assage Gatha Mayer, MD 09/22/2022 11:24:00 AM This report has been signed electronically.

## 2022-09-22 NOTE — Progress Notes (Signed)
[facility] History and Physical   Primary Care Physician:  [name], MD   Reason for Procedure:   Dysphagia + achalasia, hx polyps  Plan:    EGD + esophageal dilation, colonoscopy     HPI: Catherine Ramsey is a 77 y.o. female African-American woman with a history of precancerous colon polyps, achalasia type II status post Heller myotomy 2015, diabetes mellitus type 2 here for follow-up complaining of intermittent mild solid food dysphagia. Particularly with meats. She is currently taking omeprazole 20 mg daily as needed. No frequent heartburn symptoms reported. History of colon polyps, adenomas serrated adenomas/polyps and tubulovillous adenomas with last colonoscopy showing 7 diminutive adenomas in September 2020 and due for recall   Originally had 06/2122 appointment that was cancelled due to visual changes - has had eval and Tx by retina specialist - proliferative diabetic retinopathy Past Medical History:  Diagnosis Date   Achalasia - Type II 05/08/2010   Qualifier: Diagnosis of  By: Carlean Purl MD, Tonna Boehringer E    Acute kidney injury ([place]) 02/26/2019   Candida esophagitis ([place]) 03/26/2014   Cataract    COVID-19 virus detected 02/25/2019   Diabetes mellitus    type 2   Family history of adverse reaction to anesthesia    son hard to wake up   GERD (gastroesophageal reflux disease)    Hyperlipidemia    Hypertension    Syncope 02/25/2019   Trigger ring finger of right hand 10/07/2016    Past Surgical History:  Procedure Laterality Date   ABDOMINAL HYSTERECTOMY     BREAST BIOPSY     left/benign   COLONOSCOPY  01/31/2014   ESOPHAGEAL MANOMETRY N/A 04/01/2014   Procedure: ESOPHAGEAL MANOMETRY (EM);  Surgeon: Gatha Mayer, MD;  Location: [facility];  Service: Endoscopy;  Laterality: N/A;   HELLER MYOTOMY N/A 12/07/2016   Procedure: LAPAROSCOPIC HELLER MYOTOMY, DOR FUNDOPLICATION, HIATAL HERNIA REPAIR, INTRAOPERATIVE UPPER ENDOSCOPY;  Surgeon:  Jackolyn Confer, MD;  Location: [facility];  Service: General;  Laterality: N/A;   IR ANGIOGRAM PELVIS SELECTIVE OR SUPRASELECTIVE  10/20/2021   IR RADIOLOGIST EVAL & MGMT  10/01/2021   IR RADIOLOGIST EVAL & MGMT  11/25/2021   IR TIB-PERO ART ATHEREC INC PTA MOD SED  10/15/2021   IR US GUIDE VASC ACCESS RIGHT  10/15/2021   TOTAL HIP ARTHROPLASTY     right pin then replacement   UPPER GASTROINTESTINAL ENDOSCOPY      Prior to Admission medications   Medication Sig Start Date End Date Taking? Authorizing Provider  Alcohol Swabs (B-D SINGLE USE SWABS REGULAR) PADS  12/10/19  Yes [provider]  amLODipine (NORVASC) 10 MG tablet Take 10 mg by mouth daily.   Yes [provider]  Blood Glucose Monitoring Suppl (TRUE METRIX AIR GLUCOSE METER) w/Device KIT  11/30/19  Yes [provider]  empagliflozin (JARDIANCE) 10 MG TABS tablet Take 10 mg by mouth daily.   Yes [provider]  gabapentin (NEURONTIN) 300 MG capsule Take 300 mg by mouth at bedtime.   Yes [provider]  insulin aspart (NOVOLOG) 100 UNIT/ML FlexPen Inject 9 Units into the skin 3 (three) times daily as needed for high blood sugar.   Yes [provider]  Insulin Syringe-Needle U-100 31G X 5/16" 0.5 ML MISC Use one syringe twice daily DX:  250.70 09/26/12  Yes [provider]  LANTUS SOLOSTAR 100 UNIT/ML Solostar Pen Inject 17 Units into the skin daily. 10/24/19  Yes [provider]  losartan-hydrochlorothiazide (  HYZAAR) 50-12.5 MG tablet Take 1 tablet by mouth daily.   Yes [provider]  omeprazole (PRILOSEC) 20 MG capsule Take 20 mg by mouth daily as needed (acid reflux).   Yes [provider]  pravastatin (PRAVACHOL) 40 MG tablet Take 40 mg by mouth at bedtime. 12/02/18  Yes [provider]  TRUE METRIX BLOOD GLUCOSE TEST test strip  11/30/19  Yes [provider]  TRUEplus Lancets 33G [place]  11/30/19  Yes [provider]   acetaminophen (TYLENOL) 500 MG tablet Take 1,000 mg by mouth every 6 (six) hours as needed for moderate pain.    [provider]  CINNAMON PO Take 1 capsule by mouth daily. With chromium    [provider]  ibuprofen (ADVIL) 200 MG tablet Take 400 mg by mouth every 6 (six) hours as needed for moderate pain.    [provider]  KLOR-CON M20 20 MEQ tablet Take 20 mEq by mouth daily. 09/06/17   [provider]  Liniments (BLUE-EMU SUPER STRENGTH) CREA Apply 1 application. topically 3 (three) times daily.    [provider]    Current Outpatient Medications  Medication Sig Dispense Refill   Alcohol Swabs (B-D SINGLE USE SWABS REGULAR) PADS      amLODipine (NORVASC) 10 MG tablet Take 10 mg by mouth daily.     Blood Glucose Monitoring Suppl (TRUE METRIX AIR GLUCOSE METER) w/Device KIT      empagliflozin (JARDIANCE) 10 MG TABS tablet Take 10 mg by mouth daily.     gabapentin (NEURONTIN) 300 MG capsule Take 300 mg by mouth at bedtime.     insulin aspart (NOVOLOG) 100 UNIT/ML FlexPen Inject 9 Units into the skin 3 (three) times daily as needed for high blood sugar.     Insulin Syringe-Needle U-100 31G X 5/16" 0.5 ML MISC Use one syringe twice daily DX:  250.70     LANTUS SOLOSTAR 100 UNIT/ML Solostar Pen Inject 17 Units into the skin daily.     losartan-hydrochlorothiazide (HYZAAR) 50-12.5 MG tablet Take 1 tablet by mouth daily.     omeprazole (PRILOSEC) 20 MG capsule Take 20 mg by mouth daily as needed (acid reflux).     pravastatin (PRAVACHOL) 40 MG tablet Take 40 mg by mouth at bedtime.     TRUE METRIX BLOOD GLUCOSE TEST test strip      TRUEplus Lancets 33G MISC      acetaminophen (TYLENOL) 500 MG tablet Take 1,000 mg by mouth every 6 (six) hours as needed for moderate pain.     CINNAMON PO Take 1 capsule by mouth daily. With chromium     ibuprofen (ADVIL) 200 MG tablet Take 400 mg by mouth every 6 (six) hours as needed for moderate pain.     KLOR-CON  M20 20 MEQ tablet Take 20 mEq by mouth daily.  0   Liniments (BLUE-EMU SUPER STRENGTH) CREA Apply 1 application. topically 3 (three) times daily.     Current Facility-Administered Medications  Medication Dose Route Frequency Provider Last Rate Last Admin   0.9 %  sodium chloride infusion  500 mL Intravenous Continuous Gatha Mayer, MD       0.9 %  sodium chloride infusion  500 mL Intravenous Once Gatha Mayer, MD        Allergies as of 09/22/2022 - Review Complete 09/22/2022  Allergen Reaction Noted   Lovastatin Rash 10/07/2021    Family History  Problem Relation Age of Onset   Diabetes Mother  died at 43 from Covid-19   Hypertension Mother    Hyperlipidemia Mother    Breast cancer Mother    Diabetes Father    Hypertension Father    Hypertension Sister    Hypertension Sister    Colon polyps Daughter    Colon cancer Neg Hx    Esophageal cancer Neg Hx    Rectal cancer Neg Hx    Stomach cancer Neg Hx     Social History   Socioeconomic History   Marital status: Married    Spouse name: Not on file   Number of children: Not on file   Years of education: Not on file   Highest education level: Not on file  Occupational History   Not on file  Tobacco Use   Smoking status: Never   Smokeless tobacco: Never  Vaping Use   Vaping Use: Never used  Substance and Sexual Activity   Alcohol use: No   Drug use: No   Sexual activity: Yes  Other Topics Concern   Not on file  Social History Narrative   Patient is married and retired   No alcohol tobacco or drug use   Social Determinants of Radio broadcast assistant Strain: Not on file  Food Insecurity: Not on file  Transportation Needs: Not on file  Physical Activity: Not on file  Stress: Not on file  Social Connections: Not on file  Intimate Partner Violence: Not on file    Review of Systems:  All other review of systems negative except as mentioned in the HPI.  Physical Exam: Vital signs BP (!)  165/76   Pulse (!) 53   Temp (!) 96.9 F (36.1 C) (Temporal)   Resp 17   Ht 5' 7"$  (1.702 m)   Wt 182 lb (82.6 kg)   SpO2 99%   BMI 28.51 kg/m   General:   Alert,  Well-developed, well-nourished, pleasant and cooperative in NAD Lungs:  Clear throughout to auscultation.   Heart:  Regular rate and rhythm; no murmurs, clicks, rubs,  or gallops. Abdomen:  Soft, nontender and nondistended. Normal bowel sounds.  Small soft umbilical hernia Neuro/Psych:  Alert and cooperative. Normal mood and affect. A and O x 3   @Kaydon Husby$  Simonne Maffucci, MD, [facility] [phone] (pager) 09/22/2022 10:23 AM@

## 2022-09-23 ENCOUNTER — Telehealth: Payer: Self-pay | Admitting: *Deleted

## 2022-09-23 DIAGNOSIS — Z794 Long term (current) use of insulin: Secondary | ICD-10-CM | POA: Diagnosis not present

## 2022-09-23 DIAGNOSIS — E785 Hyperlipidemia, unspecified: Secondary | ICD-10-CM | POA: Diagnosis not present

## 2022-09-23 DIAGNOSIS — I10 Essential (primary) hypertension: Secondary | ICD-10-CM | POA: Diagnosis not present

## 2022-09-23 DIAGNOSIS — E1151 Type 2 diabetes mellitus with diabetic peripheral angiopathy without gangrene: Secondary | ICD-10-CM | POA: Diagnosis not present

## 2022-09-23 DIAGNOSIS — N1831 Chronic kidney disease, stage 3a: Secondary | ICD-10-CM | POA: Diagnosis not present

## 2022-09-23 NOTE — Telephone Encounter (Signed)
  Follow up Call-     09/22/2022    9:38 AM 06/22/2022    1:46 PM  Call back number  Post procedure Call Back phone  # [phone]  Permission to leave phone message Yes Yes     Patient questions:  Do you have a fever, pain , or abdominal swelling? No. Pain Score  0 *  Have you tolerated food without any problems? Yes.    Have you been able to return to your normal activities? Yes.    Do you have any questions about your discharge instructions: Diet   No. Medications  No. Follow up visit  No.  Do you have questions or concerns about your Care? No.  Actions: * If pain score is 4 or above: No action needed, pain <4.

## 2022-09-24 DIAGNOSIS — I10 Essential (primary) hypertension: Secondary | ICD-10-CM | POA: Diagnosis not present

## 2022-09-24 DIAGNOSIS — K219 Gastro-esophageal reflux disease without esophagitis: Secondary | ICD-10-CM | POA: Diagnosis not present

## 2022-09-24 DIAGNOSIS — E1151 Type 2 diabetes mellitus with diabetic peripheral angiopathy without gangrene: Secondary | ICD-10-CM | POA: Diagnosis not present

## 2022-09-24 DIAGNOSIS — R7989 Other specified abnormal findings of blood chemistry: Secondary | ICD-10-CM | POA: Diagnosis not present

## 2022-09-24 DIAGNOSIS — E785 Hyperlipidemia, unspecified: Secondary | ICD-10-CM | POA: Diagnosis not present

## 2022-09-28 DIAGNOSIS — Z794 Long term (current) use of insulin: Secondary | ICD-10-CM | POA: Diagnosis not present

## 2022-09-28 DIAGNOSIS — Z1339 Encounter for screening examination for other mental health and behavioral disorders: Secondary | ICD-10-CM | POA: Diagnosis not present

## 2022-09-28 DIAGNOSIS — E1165 Type 2 diabetes mellitus with hyperglycemia: Secondary | ICD-10-CM | POA: Diagnosis not present

## 2022-09-28 DIAGNOSIS — I129 Hypertensive chronic kidney disease with stage 1 through stage 4 chronic kidney disease, or unspecified chronic kidney disease: Secondary | ICD-10-CM | POA: Diagnosis not present

## 2022-09-28 DIAGNOSIS — Z0000 Encounter for general adult medical examination without abnormal findings: Secondary | ICD-10-CM | POA: Diagnosis not present

## 2022-09-28 DIAGNOSIS — B351 Tinea unguium: Secondary | ICD-10-CM | POA: Diagnosis not present

## 2022-09-28 DIAGNOSIS — I739 Peripheral vascular disease, unspecified: Secondary | ICD-10-CM | POA: Diagnosis not present

## 2022-09-28 DIAGNOSIS — Z1331 Encounter for screening for depression: Secondary | ICD-10-CM | POA: Diagnosis not present

## 2022-09-28 DIAGNOSIS — N1831 Chronic kidney disease, stage 3a: Secondary | ICD-10-CM | POA: Diagnosis not present

## 2022-09-28 DIAGNOSIS — E119 Type 2 diabetes mellitus without complications: Secondary | ICD-10-CM | POA: Diagnosis not present

## 2022-09-28 DIAGNOSIS — I10 Essential (primary) hypertension: Secondary | ICD-10-CM | POA: Diagnosis not present

## 2022-09-28 DIAGNOSIS — E785 Hyperlipidemia, unspecified: Secondary | ICD-10-CM | POA: Diagnosis not present

## 2022-09-28 DIAGNOSIS — R82998 Other abnormal findings in urine: Secondary | ICD-10-CM | POA: Diagnosis not present

## 2022-09-28 DIAGNOSIS — E1151 Type 2 diabetes mellitus with diabetic peripheral angiopathy without gangrene: Secondary | ICD-10-CM | POA: Diagnosis not present

## 2022-10-01 ENCOUNTER — Ambulatory Visit (INDEPENDENT_AMBULATORY_CARE_PROVIDER_SITE_OTHER): Payer: Medicare HMO | Admitting: Podiatry

## 2022-10-01 ENCOUNTER — Ambulatory Visit (INDEPENDENT_AMBULATORY_CARE_PROVIDER_SITE_OTHER): Payer: Medicare HMO

## 2022-10-01 ENCOUNTER — Encounter: Payer: Self-pay | Admitting: Podiatry

## 2022-10-01 DIAGNOSIS — B351 Tinea unguium: Secondary | ICD-10-CM | POA: Diagnosis not present

## 2022-10-01 DIAGNOSIS — M79675 Pain in left toe(s): Secondary | ICD-10-CM

## 2022-10-01 DIAGNOSIS — M79674 Pain in right toe(s): Secondary | ICD-10-CM

## 2022-10-01 DIAGNOSIS — I999 Unspecified disorder of circulatory system: Secondary | ICD-10-CM

## 2022-10-01 DIAGNOSIS — L03032 Cellulitis of left toe: Secondary | ICD-10-CM | POA: Insufficient documentation

## 2022-10-01 DIAGNOSIS — L84 Corns and callosities: Secondary | ICD-10-CM | POA: Diagnosis not present

## 2022-10-01 NOTE — Progress Notes (Signed)
This patient returns to my office for at risk foot care.  This patient requires this care by a professional since this patient will be at risk due to having diabetes and PAD and vascular disease.  This patient is unable to cut nails herself since the patient cannot reach her nails.These nails are painful walking and wearing shoes.   She says the big toenail left foot is especially painful.  She was seen by Dr.  Sharlett Iles who prescribed lamisil for her toenail.  Vascular studies were ordered by Dr.  Posey Pronto one year ago. This patient presents for at risk foot care today.  General Appearance  Alert, conversant and in no acute stress.  Vascular  Dorsalis pedis and posterior tibial  pulses are  not palpable  bilaterally.  Capillary return is within normal limits  bilaterally. Cold feet.  bilaterally.  Absent hair.    Neurologic  Senn-Weinstein monofilament wire test within normal limits  bilaterally. Muscle power within normal limits bilaterally.  Nails Thick disfigured discolored nails with subungual debris  from hallux to fifth toes bilaterally. No evidence of bacterial infection or drainage bilaterally.  Orthopedic  No limitations of motion  feet .  No crepitus or effusions noted.  No bony pathology or digital deformities noted. Hammer toes  B/L. Painful distal aspect left hallux upon palpation.    Skin  normotropic skin with no porokeratosis noted bilaterally.  Significant necrotic tissue on distal aspect nail bed.  No redness or swelling noted.  No pus noted.   Onychomycosis  Pain in right toes  Pain in left toes  Callus pre-ulcerous.  Consent was obtained for treatment procedures.   Mechanical debridement of nails 1-5  bilaterally performed with a nail nipper.  Filed with dremel without incident. I am concerned about the distal aspect right hallux. Xray taken left hallux and bone is not cystic.  There is evidence of calcified vessels in her left foot.  Her tbi was recorded as absent.  Patient says  she has vascular surgery but does not believe it was helpful since her foot feels havy.  She has pain pain sleeping and needs to put her left foot in dependant position to alleviate her pain.  To watch her toe conservatively in future.  If problem occurs she needs to return to see Dr.  Posey Pronto.   Return office visit   10 weeks  for nail care.                  Told patient to return for periodic foot care and evaluation due to potential at risk complications.   Gardiner Barefoot DPM

## 2022-10-12 ENCOUNTER — Encounter: Payer: Medicare HMO | Admitting: Cardiology

## 2022-10-12 DIAGNOSIS — R0989 Other specified symptoms and signs involving the circulatory and respiratory systems: Secondary | ICD-10-CM

## 2022-10-12 DIAGNOSIS — I739 Peripheral vascular disease, unspecified: Secondary | ICD-10-CM

## 2022-10-12 DIAGNOSIS — Z006 Encounter for examination for normal comparison and control in clinical research program: Secondary | ICD-10-CM

## 2022-10-12 NOTE — Progress Notes (Signed)
Chief Complaint  Patient presents with   Research Consent     "Prevail-ASCVD" study using Cholestryl Esther transfer protein inhibitor Obecertrapib 10 mg daily in patients with ASCVD on maximal lipid-lowering therapy, LDL >80 mg and CV risk reduction.      ICD-10-CM   1. Research exam  Z00.6      Physical Exam Neck:     Vascular: Carotid bruit (left) present. No JVD.  Cardiovascular:     Rate and Rhythm: Normal rate and regular rhythm.     Pulses: Intact distal pulses.          Popliteal pulses are 2+ on the right side and 0 on the left side.       Dorsalis pedis pulses are 0 on the right side and 0 on the left side.       Posterior tibial pulses are 0 on the right side and 0 on the left side.     Heart sounds: Murmur heard.     Early systolic murmur is present with a grade of 2/6 at the upper right sternal border.     No gallop.  Pulmonary:     Effort: Pulmonary effort is normal.     Breath sounds: Normal breath sounds.  Abdominal:     General: Bowel sounds are normal.     Palpations: Abdomen is soft.  Musculoskeletal:     Right lower leg: No edema.     Left lower leg: No edema.

## 2022-10-14 ENCOUNTER — Ambulatory Visit: Payer: Medicare HMO | Admitting: Cardiology

## 2022-10-14 ENCOUNTER — Encounter: Payer: Self-pay | Admitting: Cardiology

## 2022-10-14 VITALS — BP 145/77 | HR 71 | Resp 16 | Ht 67.0 in | Wt 189.0 lb

## 2022-10-14 DIAGNOSIS — I739 Peripheral vascular disease, unspecified: Secondary | ICD-10-CM | POA: Diagnosis not present

## 2022-10-14 DIAGNOSIS — I10 Essential (primary) hypertension: Secondary | ICD-10-CM | POA: Diagnosis not present

## 2022-10-14 DIAGNOSIS — R0989 Other specified symptoms and signs involving the circulatory and respiratory systems: Secondary | ICD-10-CM

## 2022-10-14 DIAGNOSIS — R9431 Abnormal electrocardiogram [ECG] [EKG]: Secondary | ICD-10-CM

## 2022-10-14 DIAGNOSIS — E7800 Pure hypercholesterolemia, unspecified: Secondary | ICD-10-CM

## 2022-10-14 DIAGNOSIS — E1151 Type 2 diabetes mellitus with diabetic peripheral angiopathy without gangrene: Secondary | ICD-10-CM

## 2022-10-14 MED ORDER — ROSUVASTATIN CALCIUM 20 MG PO TABS: 20.0000 mg | ORAL_TABLET | Freq: Every day | ORAL | 3 refills | Status: DC

## 2022-10-14 MED ORDER — CILOSTAZOL 50 MG PO TABS: 50.0000 mg | ORAL_TABLET | Freq: Two times a day (BID) | ORAL | 1 refills | Status: DC

## 2022-10-14 NOTE — Progress Notes (Signed)
Primary Physician/Referring:  [name], MD  Patient ID: Catherine Ramsey, female    DOB: 07-05-1946, 77 y.o.   MRN: [id number]  Chief Complaint  Patient presents with   PVD   New Patient (Initial Visit)   HPI:    Catherine Ramsey  is a 77 y.o.African-American female patient with hypertension, hypercholesterolemia, uncontrolled diabetes mellitus with hyperglycemia, peripheral arterial disease, lower extremity arterial duplex had revealed severe small vessel disease below the knee and eventually underwent left anterior tibial laser atherectomy followed by balloon angioplasty on 10/15/2021, referred to [place] for establishing cardiac care and management of PAD.  Patient has been trying to exercise at the [facility], she had to frequently stop due to pain in her leg again right leg worse than the left calf, she also has degenerative joint disease.  Her activity is limited due to the above symptoms.  Past Medical History:  Diagnosis Date   Achalasia - Type II 05/08/2010   Qualifier: Diagnosis of  By: Carlean Purl MD, Tonna Boehringer E    Acute kidney injury ([place]) 02/26/2019   Candida esophagitis ([place]) 03/26/2014   Cataract    COVID-19 virus detected 02/25/2019   Diabetes mellitus    type 2   Family history of adverse reaction to anesthesia    son hard to wake up   GERD (gastroesophageal reflux disease)    Hyperlipidemia    Hypertension    Primary hypertension 02/25/2019   Syncope 02/25/2019   Trigger ring finger of right hand 10/07/2016   Past Surgical History:  Procedure Laterality Date   ABDOMINAL HYSTERECTOMY     BREAST BIOPSY     left/benign   COLONOSCOPY  01/31/2014   ESOPHAGEAL MANOMETRY N/A 04/01/2014   Procedure: ESOPHAGEAL MANOMETRY (EM);  Surgeon: Gatha Mayer, MD;  Location: [facility];  Service: Endoscopy;  Laterality: N/A;   HELLER MYOTOMY N/A 12/07/2016   Procedure: LAPAROSCOPIC HELLER MYOTOMY, DOR FUNDOPLICATION, HIATAL HERNIA REPAIR, INTRAOPERATIVE UPPER ENDOSCOPY;   Surgeon: Jackolyn Confer, MD;  Location: [facility];  Service: General;  Laterality: N/A;   IR ANGIOGRAM PELVIS SELECTIVE OR SUPRASELECTIVE  10/20/2021   IR RADIOLOGIST EVAL & MGMT  10/01/2021   IR RADIOLOGIST EVAL & MGMT  11/25/2021   IR TIB-PERO ART ATHEREC INC PTA MOD SED  10/15/2021   IR US GUIDE VASC ACCESS RIGHT  10/15/2021   TOTAL HIP ARTHROPLASTY     right pin then replacement   UPPER GASTROINTESTINAL ENDOSCOPY     Family History  Problem Relation Age of Onset   Diabetes Mother        died at 51 from Covid-19   Hypertension Mother    Hyperlipidemia Mother    Breast cancer Mother    Diabetes Father    Hypertension Father    Hypertension Sister    Hypertension Sister    Colon polyps Daughter    Colon cancer Neg Hx    Esophageal cancer Neg Hx    Rectal cancer Neg Hx    Stomach cancer Neg Hx     Social History   Tobacco Use   Smoking status: Never   Smokeless tobacco: Never  Substance Use Topics   Alcohol use: No   Marital Status: Married  ROS  Review of Systems  Cardiovascular:  Positive for claudication. Negative for chest pain, dyspnea on exertion and leg swelling.   Objective      10/14/2022    2:25 PM 09/22/2022   11:31 AM 09/22/2022   11:21 AM  Vitals with BMI  Height '5\' 7"'$     Weight 189 lbs    BMI [id number]    Systolic [id number]  Diastolic 77 71 71  Pulse 71 50 50   SpO2: 96 %  Physical Exam Neck:     Vascular: Carotid bruit (Bilateral) present. No JVD.  Cardiovascular:     Rate and Rhythm: Normal rate and regular rhythm.     Pulses: Intact distal pulses.          Popliteal pulses are 2+ on the right side and 0 on the left side.       Dorsalis pedis pulses are 0 on the right side and 0 on the left side.       Posterior tibial pulses are 0 on the right side and 0 on the left side.     Heart sounds: Murmur heard.     Early systolic murmur is present with a grade of 2/6 at the upper right sternal border.     No gallop.  Pulmonary:     Effort:  Pulmonary effort is normal.     Breath sounds: Normal breath sounds.  Abdominal:     General: Bowel sounds are normal.     Palpations: Abdomen is soft.  Musculoskeletal:     Right lower leg: No edema.     Left lower leg: No edema.     Medications and allergies   Allergies  Allergen Reactions   Lovastatin Rash     Medication list   Current Outpatient Medications:    acetaminophen (TYLENOL) 500 MG tablet, Take 1,000 mg by mouth every 6 (six) hours as needed for moderate pain., Disp: , Rfl:    Alcohol Swabs (B-D SINGLE USE SWABS REGULAR) PADS, , Disp: , Rfl:    amLODipine (NORVASC) 10 MG tablet, Take 10 mg by mouth every evening., Disp: , Rfl:    aspirin 81 MG chewable tablet, Chew 81 mg by mouth daily., Disp: , Rfl:    Blood Glucose Monitoring Suppl (TRUE METRIX AIR GLUCOSE METER) w/Device KIT, , Disp: , Rfl:    cilostazol (PLETAL) 50 MG tablet, Take 1 tablet (50 mg total) by mouth 2 (two) times daily., Disp: 60 tablet, Rfl: 1   CINNAMON PO, Take 1 capsule by mouth daily. With chromium, Disp: , Rfl:    empagliflozin (JARDIANCE) 10 MG TABS tablet, Take 10 mg by mouth daily., Disp: , Rfl:    gabapentin (NEURONTIN) 300 MG capsule, Take 300 mg by mouth at bedtime., Disp: , Rfl:    ibuprofen (ADVIL) 200 MG tablet, Take 400 mg by mouth daily as needed for moderate pain., Disp: , Rfl:    insulin aspart (NOVOLOG) 100 UNIT/ML FlexPen, Inject 9 Units into the skin 3 (three) times daily as needed for high blood sugar., Disp: , Rfl:    Insulin Syringe-Needle U-100 31G X 5/16" 0.5 ML MISC, Use one syringe twice daily DX:  250.70, Disp: , Rfl:    KLOR-CON M20 20 MEQ tablet, Take 20 mEq by mouth daily., Disp: , Rfl: 0   LANTUS SOLOSTAR 100 UNIT/ML Solostar Pen, Inject 17 Units into the skin daily., Disp: , Rfl:    Liniments (BLUE-EMU SUPER STRENGTH) CREA, Apply 1 application. topically 3 (three) times daily., Disp: , Rfl:    losartan-hydrochlorothiazide (HYZAAR) 50-12.5 MG tablet, Take 1 tablet by  mouth every morning., Disp: , Rfl:    omeprazole (PRILOSEC) 20 MG capsule, Take 20 mg by mouth daily as needed (acid reflux)., Disp: ,  Rfl:    rosuvastatin (CRESTOR) 20 MG tablet, Take 1 tablet (20 mg total) by mouth daily., Disp: 90 tablet, Rfl: 3   terbinafine (LAMISIL) 250 MG tablet, 1 tablet Orally Once a day for 90 days for toenail fungus, Disp: , Rfl:    TRUE METRIX BLOOD GLUCOSE TEST test strip, , Disp: , Rfl:    TRUEplus Lancets 33G MISC, , Disp: , Rfl:  Laboratory examination:   Recent Labs    10/15/21 0740  NA 140  K 5.0  CL 105  CO2 26  GLUCOSE 122*  BUN 25*  CREATININE 1.13*  CALCIUM 9.2  GFRNONAA 51*    Lab Results  Component Value Date   GLUCOSE 122 (H) 10/15/2021   NA 140 10/15/2021   K 5.0 10/15/2021   CL 105 10/15/2021   CO2 26 10/15/2021   BUN 25 (H) 10/15/2021   CREATININE 1.13 (H) 10/15/2021   GFRNONAA 51 (L) 10/15/2021   CALCIUM 9.2 10/15/2021   PROT 7.5 02/28/2019   ALBUMIN 3.1 (L) 02/28/2019   BILITOT 0.9 02/28/2019   ALKPHOS 72 02/28/2019   AST 23 02/28/2019   ALT 18 02/28/2019   ANIONGAP 9 10/15/2021      Lab Results  Component Value Date   ALT 18 02/28/2019   AST 23 02/28/2019   ALKPHOS 72 02/28/2019   BILITOT 0.9 02/28/2019   HEMOGLOBIN A1C Lab Results  Component Value Date   HGBA1C 9.1 (H) 02/26/2019   MPG 214.47 02/26/2019   Lab Results  Component Value Date   TSH 2.590 02/26/2019    External labs:   Labs 09/24/2022:  Hb 12.2/HCT 36.0, platelets 183.  Normal indicis.  Apolipoprotein B mildly elevated at 105 (49-90).  Total cholesterol 171, triglycerides 94, HDL 42, LDL 110.  Non-HDL cholesterol 129.  Serum glucose 164 mg, BUN 13, creatinine 1.1, EGFR 58 mL, potassium 4.0, LFTs normal.  Urine analysis negative for protein.  A1c 8.2%.  Radiology:    Cardiac Studies:   Echocardiogram 02/26/2019:  1. The left ventricle has normal systolic function with an ejection fraction of 60-65%. The cavity size was normal. Left  ventricular diastolic Doppler parameters are consistent with impaired relaxation.  2. The right ventricle has normal systolic function. The cavity was normal. There is no increase in right ventricular wall thickness.  3. Left atrial size was moderately dilated.  Lower Extremity Arterial Duplex 05/05/2021: Right: Heterogenous plaque and medial calcifications noted.  No evidence of stenosis/occlusion in the CFA, DFA, SFA, popliteal artery or TPT.  Two vessel run-off via ATA and peroneal artery. The PTA is totally occluded.   Left: Heterogenous plaque and medial calcifications noted.  50-74% stenosis in the proximal SFA, distal to ostium, low end range.  Zero vessel run-off. Occlusion noted in the mid and distal PTA, distal  peroneal artery and a short segment occlusion in the mid ATA.    ABI 09/26/2021:  Peripheral angiogram 10/15/2021:  Status post ultrasound guided access right common femoral artery for pelvic and left lower extremity angiogram, with treatment of left anterior tibial artery with laser atherectomy and balloon angioplasty, restoring in-line flow to the forefoot.  Advanced tibial arterial disease, with occlusion of the tibioperoneal trunk distally and no filling of peroneal artery or posterior tibial artery proximally.  EKG:   EKG 10/14/2022: Normal sinus rhythm at rate of 66 bpm, poor R progression, cannot exclude anteroseptal infarct old.  LVH.    Assessment     ICD-10-CM   1. Claudication in peripheral  vascular disease (HCC)  I73.9 EKG 12-Lead    rosuvastatin (CRESTOR) 20 MG tablet    PCV LOWER ARTERIAL (BILATERAL)    PCV MYOCARDIAL PERFUSION WITH LEXISCAN    cilostazol (PLETAL) 50 MG tablet    Lipid Panel With LDL/HDL Ratio    Lipoprotein A (LPA)    2. Abnormal EKG  R94.31 PCV ECHOCARDIOGRAM COMPLETE    PCV MYOCARDIAL PERFUSION WITH LEXISCAN    3. Bilateral carotid bruits  R09.89 PCV CAROTID DUPLEX (BILATERAL)    4. Type 2 diabetes mellitus with diabetic  peripheral angiopathy without gangrene, with long-term current use of insulin (HCC)  E11.51 PCV MYOCARDIAL PERFUSION WITH LEXISCAN   Z79.4     5. Hypercholesterolemia  E78.00 rosuvastatin (CRESTOR) 20 MG tablet    Lipid Panel With LDL/HDL Ratio    Lipoprotein A (LPA)    6. Primary hypertension  I10        Orders Placed This Encounter  Procedures   Lipid Panel With LDL/HDL Ratio   Lipoprotein A (LPA)   PCV MYOCARDIAL PERFUSION WITH LEXISCAN    Standing Status:   Future    Standing Expiration Date:   12/14/2022   EKG 12-Lead   PCV ECHOCARDIOGRAM COMPLETE    Standing Status:   Future    Standing Expiration Date:   10/14/2023   Meds ordered this encounter  Medications   rosuvastatin (CRESTOR) 20 MG tablet    Sig: Take 1 tablet (20 mg total) by mouth daily.    Dispense:  90 tablet    Refill:  3    Discontinue pravastatin   cilostazol (PLETAL) 50 MG tablet    Sig: Take 1 tablet (50 mg total) by mouth 2 (two) times daily.    Dispense:  60 tablet    Refill:  1   Medications Discontinued During This Encounter  Medication Reason   pravastatin (PRAVACHOL) 40 MG tablet Change in therapy     Recommendations:   Catherine Ramsey is a 77 y.o. African-American female patient with hypertension, hypercholesterolemia, uncontrolled diabetes mellitus with hyperglycemia, peripheral arterial disease, lower extremity arterial duplex had revealed severe small vessel disease below the knee and eventually underwent left anterior tibial laser atherectomy followed by balloon angioplasty on 10/15/2021, referred to [place] for establishing cardiac care and management of PAD.  1. Claudication in peripheral vascular disease ([place]) Patient has been trying to exercise at the [facility], she had to frequently stop due to pain in her leg again right leg worse than the left calf, she also has degenerative joint disease.  I will try Pletal 50 mg p.o. twice daily for PAD, she will continue with aspirin.  I will schedule her for  lower extremity arterial duplex to evaluate and reestablish baseline.  2. Abnormal EKG Due to abnormal EKG, underlying significant cardiovascular risk, she has never been evaluated by cardiology for risk stratification, will obtain an echocardiogram and also Lexiscan nuclear stress test.  She is unable to exercise on the treadmill due to PAD and also arthritis.  3. Bilateral carotid bruits She has very prominent bilateral carotid bruit, will obtain carotid artery duplex.  4. Type 2 diabetes mellitus with diabetic peripheral angiopathy without gangrene, with long-term current use of insulin ([place]) Diabetes mellitus being a high risk, she also has uncontrolled diabetes as well.  We discussed regarding making lifestyle changes.  Over the past 3 months or so, she was having difficulty with swallowing, she has lost about 30 pounds in weight, GI evaluation was negative.  Advised  her not to regain weight and in fact I would like her to lose additional 10 pounds.  She appears motivated.  5. Hypercholesterolemia LDL is not at goal, will discontinue pravastatin, change her to Crestor 20 mg daily.  We will repeat labs prior to her next office visit in 6 weeks.  6. Primary hypertension Blood pressure is elevated today but I did not make any changes.  I wanted to increase losartan HCT to 100/25 mg however patient states that it makes her extremely dizzy.  For now we will continue present dosage at 50/12.5 mg in the morning but I have advised her to change amlodipine to the evening dose to see how she does.  I would like to see her back in 6 to 8 weeks for follow-up.   Adrian Prows, MD, [facility] 10/14/2022, 6:55 PM Office: [phone]

## 2022-10-15 ENCOUNTER — Other Ambulatory Visit: Payer: Self-pay | Admitting: Interventional Radiology

## 2022-10-15 DIAGNOSIS — I739 Peripheral vascular disease, unspecified: Secondary | ICD-10-CM

## 2022-10-19 ENCOUNTER — Ambulatory Visit (INDEPENDENT_AMBULATORY_CARE_PROVIDER_SITE_OTHER): Payer: Medicare HMO

## 2022-10-19 DIAGNOSIS — M2041 Other hammer toe(s) (acquired), right foot: Secondary | ICD-10-CM

## 2022-10-19 DIAGNOSIS — E1142 Type 2 diabetes mellitus with diabetic polyneuropathy: Secondary | ICD-10-CM | POA: Diagnosis not present

## 2022-10-19 DIAGNOSIS — M2042 Other hammer toe(s) (acquired), left foot: Secondary | ICD-10-CM

## 2022-10-19 NOTE — Progress Notes (Signed)
Patient presents today to pick up diabetic shoes and insoles.  Patient was dispensed 1 pair of diabetic shoes and 3 pairs of foam casted diabetic insoles.   She tried on the shoes with the insoles and the fit was satisfactory.   Will follow up next year for new order.    

## 2022-10-21 ENCOUNTER — Inpatient Hospital Stay
Admission: RE | Admit: 2022-10-21 | Discharge: 2022-10-21 | Disposition: A | Discharge status: Home / Self Care | Payer: Medicare HMO | Source: Ambulatory Visit | Attending: Interventional Radiology | Admitting: Interventional Radiology

## 2022-10-21 NOTE — Progress Notes (Signed)
[facility] Note  10/22/2022     CHIEF COMPLAINT Patient presents for Retina Follow Up   HISTORY OF PRESENT ILLNESS: Catherine Ramsey is a 77 y.o. female who presents to the clinic today for:   HPI     Retina Follow Up   Patient presents with  Diabetic Retinopathy.  This started years ago.  Duration of 2 months.  I, the attending physician,  performed the HPI with the patient and updated documentation appropriately.        Comments   Patient states that she started seeing streaks in the left eye. Then she put a few drops of Pred OS and the streaks sort of cleared up. She is having a hard time seeing with reading glasses. Her blood sugar was 122.      Last edited by Bernarda Caffey, MD on 10/22/2022 12:05 PM.    Pt is delayed to follow up from 3 weeks to 2+months, pt states she forgot about her last appt, she states her left eye looks like it has a yellow film in it  Referring physician: [name], MD [place],  [place] [address]  HISTORICAL INFORMATION:   Selected notes from the [facility] Referred by Dr. Ronnald Ramp for [facility] OD LEE:  Ocular Hx- PMH-    CURRENT MEDICATIONS: No current outpatient medications on file. (Ophthalmic Drugs)   No current facility-administered medications for this visit. (Ophthalmic Drugs)   Current Outpatient Medications (Other)  Medication Sig   acetaminophen (TYLENOL) 500 MG tablet Take 1,000 mg by mouth every 6 (six) hours as needed for moderate pain.   Alcohol Swabs (B-D SINGLE USE SWABS REGULAR) PADS    amLODipine (NORVASC) 10 MG tablet Take 10 mg by mouth every evening.   aspirin 81 MG chewable tablet Chew 81 mg by mouth daily.   Blood Glucose Monitoring Suppl (TRUE METRIX AIR GLUCOSE METER) w/Device KIT    cilostazol (PLETAL) 50 MG tablet Take 1 tablet (50 mg total) by mouth 2 (two) times daily.   CINNAMON PO Take 1 capsule by mouth daily. With chromium   empagliflozin  (JARDIANCE) 10 MG TABS tablet Take 10 mg by mouth daily.   gabapentin (NEURONTIN) 300 MG capsule Take 300 mg by mouth at bedtime.   ibuprofen (ADVIL) 200 MG tablet Take 400 mg by mouth daily as needed for moderate pain.   insulin aspart (NOVOLOG) 100 UNIT/ML FlexPen Inject 9 Units into the skin 3 (three) times daily as needed for high blood sugar.   Insulin Syringe-Needle U-100 31G X 5/16" 0.5 ML MISC Use one syringe twice daily DX:  250.70   KLOR-CON M20 20 MEQ tablet Take 20 mEq by mouth daily.   LANTUS SOLOSTAR 100 UNIT/ML Solostar Pen Inject 17 Units into the skin daily.   Liniments (BLUE-EMU SUPER STRENGTH) CREA Apply 1 application. topically 3 (three) times daily.   losartan-hydrochlorothiazide (HYZAAR) 50-12.5 MG tablet Take 1 tablet by mouth every morning.   omeprazole (PRILOSEC) 20 MG capsule Take 20 mg by mouth daily as needed (acid reflux).   rosuvastatin (CRESTOR) 20 MG tablet Take 1 tablet (20 mg total) by mouth daily.   terbinafine (LAMISIL) 250 MG tablet 1 tablet Orally Once a day for 90 days for toenail fungus   TRUE METRIX BLOOD GLUCOSE TEST test strip    TRUEplus Lancets 33G MISC    No current facility-administered medications for this visit. (Other)   REVIEW OF SYSTEMS: ROS  Positive for: Endocrine, Eyes Negative for: Constitutional, Gastrointestinal, Neurological, Skin, Genitourinary, Musculoskeletal, HENT, Cardiovascular, Respiratory, Psychiatric, Allergic/Imm, Heme/Lymph Last edited by Annie Paras, COT on 10/22/2022  8:49 AM.     ALLERGIES Allergies  Allergen Reactions   Lovastatin Rash   PAST MEDICAL HISTORY Past Medical History:  Diagnosis Date   Achalasia - Type II 05/08/2010   Qualifier: Diagnosis of  By: Carlean Purl MD, Tonna Boehringer E    Acute kidney injury ([place]) 02/26/2019   Candida esophagitis ([place]) 03/26/2014   Cataract    COVID-19 virus detected 02/25/2019   Diabetes mellitus    type 2   Family history of adverse reaction to anesthesia     son hard to wake up   GERD (gastroesophageal reflux disease)    Hyperlipidemia    Hypertension    Primary hypertension 02/25/2019   Syncope 02/25/2019   Trigger ring finger of right hand 10/07/2016   Past Surgical History:  Procedure Laterality Date   ABDOMINAL HYSTERECTOMY     BREAST BIOPSY     left/benign   COLONOSCOPY  01/31/2014   ESOPHAGEAL MANOMETRY N/A 04/01/2014   Procedure: ESOPHAGEAL MANOMETRY (EM);  Surgeon: Gatha Mayer, MD;  Location: [facility];  Service: Endoscopy;  Laterality: N/A;   HELLER MYOTOMY N/A 12/07/2016   Procedure: LAPAROSCOPIC HELLER MYOTOMY, DOR FUNDOPLICATION, HIATAL HERNIA REPAIR, INTRAOPERATIVE UPPER ENDOSCOPY;  Surgeon: Jackolyn Confer, MD;  Location: [facility] OR[facility];  Service: General;  Laterality: N/A;   IR ANGIOGRAM PELVIS SELECTIVE OR SUPRASELECTIVE  10/20/2021   IR RADIOLOGIST EVAL & MGMT  10/01/2021   IR RADIOLOGIST EVAL & MGMT  11/25/2021   IR TIB-PERO ART ATHEREC INC PTA MOD SED  10/15/2021   IR US GUIDE VASC ACCESS RIGHT  10/15/2021   TOTAL HIP ARTHROPLASTY     right pin then replacement   UPPER GASTROINTESTINAL ENDOSCOPY     FAMILY HISTORY Family History  Problem Relation Age of Onset   Diabetes Mother        died at 56 from Covid-19   Hypertension Mother    Hyperlipidemia Mother    Breast cancer Mother    Diabetes Father    Hypertension Father    Hypertension Sister    Hypertension Sister    Colon polyps Daughter    Colon cancer Neg Hx    Esophageal cancer Neg Hx    Rectal cancer Neg Hx    Stomach cancer Neg Hx    SOCIAL HISTORY Social History   Tobacco Use   Smoking status: Never   Smokeless tobacco: Never  Vaping Use   Vaping Use: Never used  Substance Use Topics   Alcohol use: No   Drug use: No       OPHTHALMIC EXAM:  Base Eye Exam     Visual Acuity (Snellen - Linear)       Right Left   Dist [place] 20/30 20/30   Dist ph [place] NI NI         Tonometry (Tonopen, 8:53 AM)       Right Left   Pressure 17 17          Pupils       Dark Light Shape React APD   Right 4 3 Round Brisk None   Left 4 3 Round Brisk None         Visual Fields       Left Right    Full Full         Extraocular Movement  Right Left    Full, Ortho Full, Ortho         Neuro/Psych     Oriented x3: Yes   Mood/Affect: Normal         Dilation     Both eyes: 1.0% Mydriacyl, 2.5% Phenylephrine @ 8:51 AM           Slit Lamp and Fundus Exam     External Exam       Right Left   External Normal Normal         Slit Lamp Exam       Right Left   Lids/Lashes Dermatochalasis - upper lid Dermatochalasis - upper lid   Conjunctiva/Sclera Melanosis, nasal and temporal pinguecula Melanosis, nasal and temporal pinguecula   Cornea arcus, well healed cataract wound, tear film debris arcus, well healed cataract wound, trace PEE, tear film debris   Anterior Chamber Deep and Clear Deep and Clear   Iris Round and dilated, No NVI Round and dilated, No NVI   Lens PC IOL in good position, pigment/RBC on post optic PC IOL in good position,    Anterior Vitreous Syneresis, blood stained vitreous condensations improved, Posterior vitreous detachment Syneresis, blood stained vitreous condensations settling inferiorly         Fundus Exam       Right Left   Disc Pink and sharp, mild pallor, sharp rim, mild PPP Mild pallor, sharp rim, Mild PPP   C/D Ratio 0.2 0.3   Macula Flat, Blunted foveal reflex, No heme or edema Good foveal reflex, mild ERM, scattered MA, no edema   Vessels Attenuated, tortuous, severe attenuation IN arcades Attenuated, copper wiring, mild tortuosity   Periphery Attached, scatterd MA/DBH, focal fibrosis at 1130, good 360 PRP Attached, scattered MA/DBH, good 360 PRP changes           IMAGING AND PROCEDURES  Imaging and Procedures for 10/22/2022  OCT, Retina - OU - Both Eyes       Right Eye Quality was borderline. Central Foveal Thickness: 238. Progression has improved. Findings  include normal foveal contour, no IRF, no SRF (Mild interval improvement in vitreous opacities).   Left Eye Quality was borderline. Central Foveal Thickness: 242. Progression has worsened. Findings include normal foveal contour, no IRF, no SRF, preretinal fibrosis (Partial PVD, PRF over disc, mild interval increase in vitreous opacities).   Notes *Images captured and stored on drive  Diagnosis / Impression:  NFP, no IRF/SRF OU OD: Mild interval improvement in vitreous opacities OS: Partial PVD, PRF over disc, mild interval increase in vitreous opacities  Clinical management:  See below  Abbreviations: NFP - Normal foveal profile. CME - cystoid macular edema. PED - pigment epithelial detachment. IRF - intraretinal fluid. SRF - subretinal fluid. EZ - ellipsoid zone. ERM - epiretinal membrane. ORA - outer retinal atrophy. ORT - outer retinal tubulation. SRHM - subretinal hyper-reflective material. IRHM - intraretinal hyper-reflective material      Intravitreal Injection, Pharmacologic Agent - OS - Left Eye       Time Out 10/22/2022. 9:20 AM. Confirmed correct patient, procedure, site, and patient consented.   Anesthesia Topical anesthesia was used. Anesthetic medications included Lidocaine 2%, Proparacaine 0.5%.   Procedure Preparation included 5% betadine to ocular surface, eyelid speculum. A supplied needle was used.   Injection: 1.25 mg Bevacizumab 1.25mg /0.70ml   Route: Intravitreal, Site: Left Eye   NDC: H061816, LotHX:[id number], Expiration date: 11/28/2022   Post-op Post injection exam found visual acuity of at least counting fingers.  The patient tolerated the procedure well. There were no complications. The patient received written and verbal post procedure care education.             ASSESSMENT/PLAN:    ICD-10-CM   1. Proliferative diabetic retinopathy of both eyes without macular edema associated with type 2 diabetes mellitus (HCC)  E11.3593 OCT, Retina - OU -  Both Eyes    Intravitreal Injection, Pharmacologic Agent - OS - Left Eye    Bevacizumab (AVASTIN) SOLN 1.25 mg    2. Vitreous hemorrhage due to proliferative diabetic retinopathy ([place])  E11.3599    H43.10     3. Essential hypertension  I10     4. Hypertensive retinopathy of both eyes  H35.033     5. Pseudophakia, both eyes  Z96.1       1,2. Proliferative diabetic retinopathy OU, w/ h/o VH OD  - pt lost to f/u from 01.08.24 to 03.22.24 (~2.5 mos)  - pt presents acutely for new floaters and blurred vision OS -- onset Monday, 03.18.24 - FA (11.28.23) shows OD: Multiple patches NVE nasal hemisphere. Scattered blockage from diffuse VH. OS: Scattered patches of NVE greatest nasal hemisphere. Large area of vascular non profusion, nasal periphery. **pt will need PRP OU** - s/p IVA OD #1 (11.28.23), #2 (12.28.23) - s/p PRP OS (12.12.23) - s/p PRP OD (01.08.24) - BCVA 20/30 OU - exam shows diffuse VH OD -- improved, OS with scattered MA, DBH and increased preretinal heme and blood stained vit condensations - OCT shows OD: Mild interval improvement in vitreous opacities; OS: Partial PVD, PRF over disc, mild interval increase in vitreous opacities - discussed findings and prognosis - recommend IVA OS #1 today, 03.22.24 for new preretinal and vit heme OS - pt wishes to proceed with injection - RBA of procedure discussed, questions answered - informed consent obtained and signed - see procedure note - informed consent obtained and signed for Avastin, 11.28.23 (OD) - informed consent obtained and signed for Avastin, 03.22.24 (OS) - VH precautions reviewed -- minimize activities, keep head elevated, avoid ASA/NSAIDs/blood thinners as able  - f/u 4 weeks, DFE, OCT   3,4. Hypertensive retinopathy OU - discussed importance of tight BP control - monitor  5. Pseudophakia OU  - s/p CE/IOL OU (Dr. Alette Fetter)  - IOLs in good position, doing well  - monitor  Ophthalmic Meds Ordered this visit:  Meds  ordered this encounter  Medications   Bevacizumab (AVASTIN) SOLN 1.25 mg     Return in about 4 weeks (around 11/19/2022) for f/u PDR OU, DFE, OCT.  There are no Patient Instructions on file for this visit.   This document serves as a record of services personally performed by Gardiner Sleeper, MD, PhD. It was created on their behalf by Joetta Manners COT, an ophthalmic technician. The creation of this record is the provider's dictation and/or activities during the visit.    Electronically signed by: Joetta Manners COT 10/21/2022 12:05 PM  This document serves as a record of services personally performed by Gardiner Sleeper, MD, PhD. It was created on their behalf by San Jetty. Owens Shark, OA an ophthalmic technician. The creation of this record is the provider's dictation and/or activities during the visit.    Electronically signed by: San Jetty. Marguerita Merles 03.22.2024 12:05 PM  Gardiner Sleeper, M.D., Ph.D. Diseases & Surgery of the Retina and [place] 10/22/2022   I have reviewed the above documentation for accuracy and completeness, and  I agree with the above. Gardiner Sleeper, M.D., Ph.D. 10/22/22 12:09 PM  Abbreviations: M myopia (nearsighted); A astigmatism; H hyperopia (farsighted); P presbyopia; Mrx spectacle prescription;  CTL contact lenses; OD right eye; OS left eye; OU both eyes  XT exotropia; ET esotropia; PEK punctate epithelial keratitis; PEE punctate epithelial erosions; DES dry eye syndrome; MGD meibomian gland dysfunction; ATs artificial tears; PFAT's preservative free artificial tears; [place] nuclear sclerotic cataract; PSC posterior subcapsular cataract; ERM epi-retinal membrane; PVD posterior vitreous detachment; RD retinal detachment; DM diabetes mellitus; DR diabetic retinopathy; NPDR non-proliferative diabetic retinopathy; PDR proliferative diabetic retinopathy; CSME clinically significant macular edema; DME diabetic macular edema; dbh dot  blot hemorrhages; CWS cotton wool spot; POAG primary open angle glaucoma; C/D cup-to-disc ratio; HVF humphrey visual field; GVF goldmann visual field; OCT optical coherence tomography; IOP intraocular pressure; BRVO Branch retinal vein occlusion; CRVO central retinal vein occlusion; CRAO central retinal artery occlusion; BRAO branch retinal artery occlusion; RT retinal tear; SB scleral buckle; PPV pars plana vitrectomy; VH Vitreous hemorrhage; PRP panretinal laser photocoagulation; IVK intravitreal kenalog; VMT vitreomacular traction; MH Macular hole;  NVD neovascularization of the disc; NVE neovascularization elsewhere; AREDS age related eye disease study; ARMD age related macular degeneration; POAG primary open angle glaucoma; EBMD epithelial/anterior basement membrane dystrophy; ACIOL anterior chamber intraocular lens; IOL intraocular lens; PCIOL posterior chamber intraocular lens; Phaco/IOL phacoemulsification with intraocular lens placement; [place] photorefractive keratectomy; LASIK laser assisted in situ keratomileusis; HTN hypertension; DM diabetes mellitus; COPD chronic obstructive pulmonary disease

## 2022-10-22 ENCOUNTER — Ambulatory Visit (INDEPENDENT_AMBULATORY_CARE_PROVIDER_SITE_OTHER): Payer: Medicare HMO | Admitting: Ophthalmology

## 2022-10-22 ENCOUNTER — Encounter (INDEPENDENT_AMBULATORY_CARE_PROVIDER_SITE_OTHER): Payer: Self-pay | Admitting: Ophthalmology

## 2022-10-22 DIAGNOSIS — H35033 Hypertensive retinopathy, bilateral: Secondary | ICD-10-CM

## 2022-10-22 DIAGNOSIS — I10 Essential (primary) hypertension: Secondary | ICD-10-CM | POA: Diagnosis not present

## 2022-10-22 DIAGNOSIS — H4310 Vitreous hemorrhage, unspecified eye: Secondary | ICD-10-CM | POA: Diagnosis not present

## 2022-10-22 DIAGNOSIS — Z961 Presence of intraocular lens: Secondary | ICD-10-CM

## 2022-10-22 DIAGNOSIS — E113593 Type 2 diabetes mellitus with proliferative diabetic retinopathy without macular edema, bilateral: Secondary | ICD-10-CM

## 2022-10-22 MED ORDER — BEVACIZUMAB CHEMO INJECTION 1.25MG/0.05ML SYRINGE FOR KALEIDOSCOPE
1.2500 mg | INTRAVITREAL | Status: AC | PRN
  Administered 2022-10-22: 1.25 mg via INTRAVITREAL

## 2022-10-25 ENCOUNTER — Other Ambulatory Visit: Payer: Self-pay | Admitting: Interventional Radiology

## 2022-10-25 DIAGNOSIS — L03032 Cellulitis of left toe: Secondary | ICD-10-CM

## 2022-10-27 ENCOUNTER — Ambulatory Visit: Payer: Medicare HMO

## 2022-10-27 DIAGNOSIS — R9431 Abnormal electrocardiogram [ECG] [EKG]: Secondary | ICD-10-CM

## 2022-10-27 DIAGNOSIS — R0989 Other specified symptoms and signs involving the circulatory and respiratory systems: Secondary | ICD-10-CM | POA: Diagnosis not present

## 2022-10-27 DIAGNOSIS — I739 Peripheral vascular disease, unspecified: Secondary | ICD-10-CM

## 2022-11-01 ENCOUNTER — Encounter (INDEPENDENT_AMBULATORY_CARE_PROVIDER_SITE_OTHER): Payer: Medicare HMO | Admitting: Ophthalmology

## 2022-11-04 ENCOUNTER — Ambulatory Visit
Admission: RE | Admit: 2022-11-04 | Discharge: 2022-11-04 | Disposition: A | Discharge status: Home / Self Care | Payer: Medicare HMO | Source: Ambulatory Visit | Attending: Interventional Radiology | Admitting: Interventional Radiology

## 2022-11-04 DIAGNOSIS — L03032 Cellulitis of left toe: Secondary | ICD-10-CM

## 2022-11-04 DIAGNOSIS — I739 Peripheral vascular disease, unspecified: Secondary | ICD-10-CM | POA: Diagnosis not present

## 2022-11-04 HISTORY — PX: IR RADIOLOGIST EVAL & MGMT: IMG5224

## 2022-11-04 NOTE — Progress Notes (Signed)
Chief Complaint: LLE wound, PAD/CLI   Referring Physician(s): Patel,Kevin P   History of Present Illness: Catherine Ramsey is a 77 y.o. female presenting as a scheduled visit to [place].    She is here today by herself for our interview.   History:  Catherine Ramsey was referred to [place] by our colleagues in podiatry last year, we met her 10/01/21 for left fifth and great toe wounds/pain.  We treated her 10/15/21 with laser atherectomy and PTA of multifocal disease of the left AT.    After treatment she had complete resolution of her pain and interval interval healing of the fifth and first toe wounds.    Interval History:   Today she tells me that she has no new complaints of her legs.  She has not had recurrent wound of the lower extremity.  She tells me that she is currently working out at the [facility] 3-4 times per week with track walking and eliptical, at least 30 minutes at a time.  She is able to do this comfortably.  I cannot elicit any further history of resting pain/night pain, and she tells me this has resolved.   She continues on maximal medical therapy.  She is taking ASA, crestor, Hyzaar.  Her DM is managed.  She is non smoker  She has recently initiated CV care with Dr. Einar Gip, and has undergone lower extremity ABI/duplex and carotid duplex.  I see the written result in the chart, but cannot view the studies.   She denies any left arm claudication type symptoms, and she has a palpable left radial pulse.   Past Medical History:  Diagnosis Date   Achalasia - Type II 05/08/2010   Qualifier: Diagnosis of  By: Carlean Purl MD, Tonna Boehringer E    Acute kidney injury ([place]) 02/26/2019   Candida esophagitis ([place]) 03/26/2014   Cataract    COVID-19 virus detected 02/25/2019   Diabetes mellitus    type 2   Family history of adverse reaction to anesthesia    son hard to wake up   GERD (gastroesophageal reflux disease)    Hyperlipidemia    Hypertension    Primary hypertension 02/25/2019    Syncope 02/25/2019   Trigger ring finger of right hand 10/07/2016    Past Surgical History:  Procedure Laterality Date   ABDOMINAL HYSTERECTOMY     BREAST BIOPSY     left/benign   COLONOSCOPY  01/31/2014   ESOPHAGEAL MANOMETRY N/A 04/01/2014   Procedure: ESOPHAGEAL MANOMETRY (EM);  Surgeon: Gatha Mayer, MD;  Location: [facility];  Service: Endoscopy;  Laterality: N/A;   HELLER MYOTOMY N/A 12/07/2016   Procedure: LAPAROSCOPIC HELLER MYOTOMY, DOR FUNDOPLICATION, HIATAL HERNIA REPAIR, INTRAOPERATIVE UPPER ENDOSCOPY;  Surgeon: Jackolyn Confer, MD;  Location: [facility];  Service: General;  Laterality: N/A;   IR ANGIOGRAM PELVIS SELECTIVE OR SUPRASELECTIVE  10/20/2021   IR RADIOLOGIST EVAL & MGMT  10/01/2021   IR RADIOLOGIST EVAL & MGMT  11/25/2021   IR TIB-PERO ART ATHEREC INC PTA MOD SED  10/15/2021   IR US GUIDE VASC ACCESS RIGHT  10/15/2021   TOTAL HIP ARTHROPLASTY     right pin then replacement   UPPER GASTROINTESTINAL ENDOSCOPY      Allergies: Lovastatin  Medications: Prior to Admission medications   Medication Sig Start Date End Date Taking? Authorizing Provider  acetaminophen (TYLENOL) 500 MG tablet Take 1,000 mg by mouth every 6 (six) hours as needed for moderate pain.    [provider]  Alcohol  Swabs (B-D SINGLE USE SWABS REGULAR) PADS  12/10/19   [provider]  amLODipine (NORVASC) 10 MG tablet Take 10 mg by mouth every evening.    [provider]  aspirin 81 MG chewable tablet Chew 81 mg by mouth daily.    [provider]  Blood Glucose Monitoring Suppl (TRUE METRIX AIR GLUCOSE METER) w/Device KIT  11/30/19   [provider]  cilostazol (PLETAL) 50 MG tablet Take 1 tablet (50 mg total) by mouth 2 (two) times daily. 10/14/22   Adrian Prows, MD  CINNAMON PO Take 1 capsule by mouth daily. With chromium    [provider]  empagliflozin (JARDIANCE) 10 MG TABS tablet Take 10 mg by mouth daily.    [provider]   gabapentin (NEURONTIN) 300 MG capsule Take 300 mg by mouth at bedtime.    [provider]  ibuprofen (ADVIL) 200 MG tablet Take 400 mg by mouth daily as needed for moderate pain.    [provider]  insulin aspart (NOVOLOG) 100 UNIT/ML FlexPen Inject 9 Units into the skin 3 (three) times daily as needed for high blood sugar.    [provider]  Insulin Syringe-Needle U-100 31G X 5/16" 0.5 ML MISC Use one syringe twice daily DX:  250.70 09/26/12   [provider]  KLOR-CON M20 20 MEQ tablet Take 20 mEq by mouth daily. 09/06/17   [provider]  LANTUS SOLOSTAR 100 UNIT/ML Solostar Pen Inject 17 Units into the skin daily. 10/24/19   [provider]  Liniments (BLUE-EMU SUPER STRENGTH) CREA Apply 1 application. topically 3 (three) times daily.    [provider]  losartan-hydrochlorothiazide (HYZAAR) 50-12.5 MG tablet Take 1 tablet by mouth every morning.    [provider]  omeprazole (PRILOSEC) 20 MG capsule Take 20 mg by mouth daily as needed (acid reflux).    [provider]  rosuvastatin (CRESTOR) 20 MG tablet Take 1 tablet (20 mg total) by mouth daily. 10/14/22 10/09/23  Adrian Prows, MD  terbinafine (LAMISIL) 250 MG tablet 1 tablet Orally Once a day for 90 days for toenail fungus 09/29/22   [provider]  TRUE METRIX BLOOD GLUCOSE TEST test strip  11/30/19   [provider]  TRUEplus Lancets 33G MISC  11/30/19   [provider]     Family History  Problem Relation Age of Onset   Diabetes Mother        died at 65 from Covid-19   Hypertension Mother    Hyperlipidemia Mother    Breast cancer Mother    Diabetes Father    Hypertension Father    Hypertension Sister    Hypertension Sister    Colon polyps Daughter    Colon cancer Neg Hx    Esophageal cancer Neg Hx    Rectal cancer Neg Hx    Stomach cancer Neg Hx     Social History   Socioeconomic History   Marital status: Married     Spouse name: Not on file   Number of children: 8   Years of education: Not on file   Highest education level: Not on file  Occupational History   Not on file  Tobacco Use   Smoking status: Never   Smokeless tobacco: Never  Vaping Use   Vaping Use: Never used  Substance and Sexual Activity   Alcohol use: No   Drug use: No   Sexual activity: Yes  Other Topics Concern   Not on file  Social History Narrative   Patient is married and retired   No alcohol tobacco or drug use   Social Determinants of Radio broadcast assistant Strain: Not on file  Food Insecurity: Not on file  Transportation Needs: Not on file  Physical Activity: Not on file  Stress: Not on file  Social Connections: Not on file       Review of Systems: A 12 point ROS discussed and pertinent positives are indicated in the HPI above.  All other systems are negative.  Review of Systems  Vital Signs: BP (!) 151/72   Pulse 75   Temp 98.3 F (36.8 C) (Oral)   Wt 84.8 kg Comment: per pt  SpO2 98%   BMI 29.29 kg/m       Physical Exam General: 77 yo female appearing stated age.  Well-developed, well-nourished.  No distress. HEENT: Atraumatic, normocephalic.  Conjugate gaze, extra-ocular motor intact. No scleral icterus or scleral injection. No lesions on external ears, nose, lips, or gums.  Oral mucosa moist, pink.  Neck: Symmetric with no goiter enlargement.  Chest/Lungs:  Symmetric chest with inspiration/expiration.  No labored breathing.  Clear to auscultation with no wheezes, rhonchi, or rales.  Heart:  RRR, with no third heart sounds appreciated. No JVD appreciated.  Abdomen:  Soft, NT/ND, with + bowel sounds.   Genito-urinary: Deferred Neurologic: Alert & Oriented to person, place, and time.   Normal affect and insight.  Appropriate questions.  Moving all 4 extremities with gross sensory intact.  Pulse Exam:  No right bruit appreciated.  Audible bruit on the left neck, which is louder at the  supra-clavicular notch. Palpable bilateral radial pulses. Doppler positive right AT & PT.  Doppler positive left AT.  Negative doppler of the left PT.  Extremities: No wound.  Trophic changes of the feet, including nail changes, dry flaking skin.  No intertriginous wounds. .        Imaging: PCV CAROTID DUPLEX (BILATERAL)  Addendum Date: 10/31/2022   Carotid artery duplex 10/27/2022: Duplex suggests stenosis in the right internal carotid artery (16-49%). <50%  stenosis in the right external carotid artery There is heterogeneous plaque. Duplex suggests stenosis in the left internal carotid artery (16-49%). <50%  stenosis in the left common carotid artery with homogeneous plaque. <50%  stenosis in the left external carotid artery. Antegrade right vertebral artery flow. Antegrade left vertebral artery flow. Follow up in one year is appropriate if clinically indicated.  Result Date: 10/31/2022 Lower Extremity Arterial Duplex 10/27/2022: No hemodynamically significant stenosis are identified in the right or left lower extremity arterial system. There is severely abnormal monophasic waveform pattern in vessels below knee suggests diffuse small vessel disease. This exam reveals mildly decreased perfusion of the right lower extremity, noted at the post tibial artery level (ABI 0.96).  This exam reveals normal perfusion of the left lower extremity (ABI 0.97).   PCV LOWER ARTERIAL (BILATERAL)  Result Date: 10/31/2022 Lower Extremity Arterial Duplex 10/27/2022: No hemodynamically significant stenosis are identified in the right or left lower extremity arterial system. There is severely abnormal monophasic waveform pattern in vessels below knee suggests diffuse small vessel disease. This exam reveals mildly decreased perfusion of the right lower extremity, noted at the post tibial artery level (ABI 0.96).  This exam reveals normal perfusion of the left lower extremity (ABI 0.97).   PCV ECHOCARDIOGRAM  COMPLETE  Result Date: 10/27/2022 Echocardiogram 10/27/2022: Left ventricle cavity is normal in size and wall thickness. Normal global wall motion. Normal  LV systolic function with EF 72%. Doppler evidence of grade I (impaired) diastolic dysfunction, normal LAP. Left atrial cavity is mildly dilated. Trileaflet aortic valve.  Trace aortic regurgitation. Mild aortic valve leaflet calcification. Structurally normal tricuspid valve.  Moderate tricuspid regurgitation. Estimated pulmonary artery systolic pressure 39 mmHg.   Intravitreal Injection, Pharmacologic Agent - OS - Left Eye  Result Date: 10/22/2022 Time Out 10/22/2022. 9:20 AM. Confirmed correct patient, procedure, site, and patient consented. Anesthesia Topical anesthesia was used. Anesthetic medications included Lidocaine 2%, Proparacaine 0.5%. Procedure Preparation included 5% betadine to ocular surface, eyelid speculum. A supplied needle was used. Injection: 1.25 mg Bevacizumab 1.25mg /0.31ml   Route: Intravitreal, Site: Left Eye   NDC: H061816, LotHX:[id number], Expiration date: 11/28/2022 Post-op Post injection exam found visual acuity of at least counting fingers. The patient tolerated the procedure well. There were no complications. The patient received written and verbal post procedure care education.   OCT, Retina - OU - Both Eyes  Result Date: 10/22/2022 Right Eye Quality was borderline. Central Foveal Thickness: 238. Progression has improved. Findings include normal foveal contour, no IRF, no SRF (Mild interval improvement in vitreous opacities). Left Eye Quality was borderline. Central Foveal Thickness: 242. Progression has worsened. Findings include normal foveal contour, no IRF, no SRF, preretinal fibrosis (Partial PVD, PRF over disc, mild interval increase in vitreous opacities). Notes *Images captured and stored on drive Diagnosis / Impression: NFP, no IRF/SRF OU OD: Mild interval improvement in vitreous opacities OS: Partial PVD, PRF over  disc, mild interval increase in vitreous opacities Clinical management: See below Abbreviations: NFP - Normal foveal profile. CME - cystoid macular edema. PED - pigment epithelial detachment. IRF - intraretinal fluid. SRF - subretinal fluid. EZ - ellipsoid zone. ERM - epiretinal membrane. ORA - outer retinal atrophy. ORT - outer retinal tubulation. SRHM - subretinal hyper-reflective material. IRHM - intraretinal hyper-reflective material    Labs:  CBC: No results for input(s): "WBC", "HGB", "HCT", "PLT" in the last 8760 hours.  COAGS: No results for input(s): "INR", "APTT" in the last 8760 hours.  BMP: No results for input(s): "NA", "K", "CL", "CO2", "GLUCOSE", "BUN", "CALCIUM", "CREATININE", "GFRNONAA", "GFRAA" in the last 8760 hours.  Invalid input(s): "CMP"  LIVER FUNCTION TESTS: No results for input(s): "BILITOT", "AST", "ALT", "ALKPHOS", "PROT", "ALBUMIN" in the last 8760 hours.  TUMOR MARKERS: No results for input(s): "AFPTM", "CEA", "CA199", "CHROMGRNA" in the last 8760 hours.  Assessment and Plan: Catherine Ramsey is a 77 yo year old female with history of CLI of the left lower extremity, for which we performed left AT revascularization with laser atherectomy/PTA about 1 year ago.    She has had complete healing of the wounds on left foot, with remission of pain.   She has no new complaints at this time, and is asymptomatic, [place] 0.    Regarding medical management, maximal medical therapy for reduction of risk factors is indicated as recommended by updated AHA guidelines1.  This includes anti-platelet medication, tight blood glucose control to a HbA1c < 7, tight blood pressure control, maximum-dose HMG-CoA reductase inhibitor.   She is on maximal medical therapy at this time.    I did let her know that since she has established cardiology/CV care with Dr. Irven Shelling office, we are happy to continuing to see her on an as needed basis.  Her preference would be to reduce the  redundancy of medical visits.    Plan: - I am happy to see her on as needed basis, as above.  For now, she would like to decrease the redundancy of her medical care.  She has had complete healing of her left foot wound with resolution of rest pain.  - Continue maximal medical therapy for cardiovascular risk reduction, including anti-platelet therapy.        Electronically Signed: Corrie Mckusick 11/04/2022, 4:37 PM   I spent a total of    25 Minutes in face to face in clinical consultation, greater than 50% of which was counseling/coordinating care for CLI/PAD, with prior revascularization of LLE.

## 2022-11-08 ENCOUNTER — Ambulatory Visit: Payer: Medicare HMO

## 2022-11-08 DIAGNOSIS — R9431 Abnormal electrocardiogram [ECG] [EKG]: Secondary | ICD-10-CM

## 2022-11-08 DIAGNOSIS — I739 Peripheral vascular disease, unspecified: Secondary | ICD-10-CM

## 2022-11-08 DIAGNOSIS — E1151 Type 2 diabetes mellitus with diabetic peripheral angiopathy without gangrene: Secondary | ICD-10-CM

## 2022-11-08 DIAGNOSIS — Z794 Long term (current) use of insulin: Secondary | ICD-10-CM | POA: Diagnosis not present

## 2022-11-10 NOTE — Progress Notes (Signed)
Regadenoson (with Mod Bruce protocol) Nuclear stress test 11/08/2022 Myocardial perfusion is normal. Breast tissue attenuation is present. All perfusion is better in stress, doubt ischemia. Overall LV systolic function is normal without regional wall motion abnormalities. Stress LV EF: 58%. Low risk study. Nondiagnostic ECG stress. The heart rate response was consistent with Regadenoson. The blood pressure response was physiologic. No previous exam available for comparison.

## 2022-11-19 ENCOUNTER — Encounter (INDEPENDENT_AMBULATORY_CARE_PROVIDER_SITE_OTHER): Payer: Medicare HMO | Admitting: Ophthalmology

## 2022-11-19 DIAGNOSIS — E113599 Type 2 diabetes mellitus with proliferative diabetic retinopathy without macular edema, unspecified eye: Secondary | ICD-10-CM

## 2022-11-19 DIAGNOSIS — E113593 Type 2 diabetes mellitus with proliferative diabetic retinopathy without macular edema, bilateral: Secondary | ICD-10-CM

## 2022-11-19 DIAGNOSIS — Z961 Presence of intraocular lens: Secondary | ICD-10-CM

## 2022-11-19 DIAGNOSIS — H35033 Hypertensive retinopathy, bilateral: Secondary | ICD-10-CM

## 2022-11-19 DIAGNOSIS — I10 Essential (primary) hypertension: Secondary | ICD-10-CM

## 2022-11-25 ENCOUNTER — Encounter: Payer: Self-pay | Admitting: Cardiology

## 2022-11-25 ENCOUNTER — Ambulatory Visit: Payer: Medicare HMO | Admitting: Cardiology

## 2022-11-25 VITALS — BP 122/61 | HR 75 | Resp 17 | Ht 67.0 in | Wt 189.0 lb

## 2022-11-25 DIAGNOSIS — I6523 Occlusion and stenosis of bilateral carotid arteries: Secondary | ICD-10-CM | POA: Diagnosis not present

## 2022-11-25 DIAGNOSIS — E7800 Pure hypercholesterolemia, unspecified: Secondary | ICD-10-CM

## 2022-11-25 DIAGNOSIS — Z794 Long term (current) use of insulin: Secondary | ICD-10-CM | POA: Diagnosis not present

## 2022-11-25 DIAGNOSIS — I739 Peripheral vascular disease, unspecified: Secondary | ICD-10-CM

## 2022-11-25 DIAGNOSIS — E1151 Type 2 diabetes mellitus with diabetic peripheral angiopathy without gangrene: Secondary | ICD-10-CM | POA: Diagnosis not present

## 2022-11-25 MED ORDER — EZETIMIBE 10 MG PO TABS: 10.0000 mg | ORAL_TABLET | Freq: Every evening | ORAL | 3 refills | Status: AC

## 2022-11-25 MED ORDER — CILOSTAZOL 50 MG PO TABS: 50.0000 mg | ORAL_TABLET | Freq: Two times a day (BID) | ORAL | 3 refills | Status: DC

## 2022-11-25 NOTE — Progress Notes (Signed)
Primary Physician/Referring:  Garlan Fillers, MD  Patient ID: Catherine Ramsey, female    DOB: 18-Jul-1946, 77 y.o.   MRN: [id number]  Chief Complaint  Patient presents with   PAD        Hypertension   Hyperlipidemia   Follow-up    6 week   HPI:    Catherine Ramsey  is a 77 y.o.African-American female patient with hypertension, hypercholesterolemia, uncontrolled diabetes mellitus with hyperglycemia, peripheral arterial disease, lower extremity arterial duplex had revealed severe small vessel disease below the knee and eventually underwent left anterior tibial laser atherectomy followed by balloon angioplasty on 10/15/2021.  She now presents for follow-up of peripheral artery disease and cardiac risk evaluation.  Since last office visit 6 weeks ago, I started him on Pletal, patient states that now she is able to walk at the [facility] and able to exercise much more than she was before.  She is also trying to eat healthy and lose weight.  Overall states that she feels better since last office visit since initiation of medical therapy for PAD.  Past Medical History:  Diagnosis Date   Achalasia - Type II 05/08/2010   Qualifier: Diagnosis of  By: Leone Payor MD, Alfonse Ras E    Acute kidney injury (HCC) 02/26/2019   Candida esophagitis (HCC) 03/26/2014   Cataract    COVID-19 virus detected 02/25/2019   Diabetes mellitus    type 2   Family history of adverse reaction to anesthesia    son hard to wake up   GERD (gastroesophageal reflux disease)    Hyperlipidemia    Hypertension    Primary hypertension 02/25/2019   Syncope 02/25/2019   Trigger ring finger of right hand 10/07/2016   Past Surgical History:  Procedure Laterality Date   ABDOMINAL HYSTERECTOMY     BREAST BIOPSY     left/benign   COLONOSCOPY  01/31/2014   ESOPHAGEAL MANOMETRY N/A 04/01/2014   Procedure: ESOPHAGEAL MANOMETRY (EM);  Surgeon: Iva Boop, MD;  Location: [facility];  Service: Endoscopy;  Laterality: N/A;    HELLER MYOTOMY N/A 12/07/2016   Procedure: LAPAROSCOPIC HELLER MYOTOMY, DOR FUNDOPLICATION, HIATAL HERNIA REPAIR, INTRAOPERATIVE UPPER ENDOSCOPY;  Surgeon: Avel Peace, MD;  Location: [facility];  Service: General;  Laterality: N/A;   IR ANGIOGRAM PELVIS SELECTIVE OR SUPRASELECTIVE  10/20/2021   IR RADIOLOGIST EVAL & MGMT  10/01/2021   IR RADIOLOGIST EVAL & MGMT  11/25/2021   IR RADIOLOGIST EVAL & MGMT  11/04/2022   IR TIB-PERO ART ATHEREC INC PTA MOD SED  10/15/2021   IR US GUIDE VASC ACCESS RIGHT  10/15/2021   TOTAL HIP ARTHROPLASTY     right pin then replacement   UPPER GASTROINTESTINAL ENDOSCOPY     Family History  Problem Relation Age of Onset   Diabetes Mother        died at 30 from Covid-19   Hypertension Mother    Hyperlipidemia Mother    Breast cancer Mother    Diabetes Father    Hypertension Father    Hypertension Sister    Hypertension Sister    Colon polyps Daughter    Colon cancer Neg Hx    Esophageal cancer Neg Hx    Rectal cancer Neg Hx    Stomach cancer Neg Hx     Social History   Tobacco Use   Smoking status: Never   Smokeless tobacco: Never  Substance Use Topics   Alcohol use: No   Marital Status: Married  ROS  Review of Systems  Cardiovascular:  Positive for claudication. Negative for chest pain, dyspnea on exertion and leg swelling.   Objective      11/25/2022    2:56 PM 11/04/2022    2:10 PM 10/14/2022    2:25 PM  Vitals with BMI  Height     Weight 189 lbs 187 lbs 189 lbs  BMI 29.59 29.28 29.59  Systolic 122 151 161  Diastolic 61 72 77  Pulse 75 75 71   SpO2: 97 %  Physical Exam Neck:     Vascular: Carotid bruit (Bilateral) present. No JVD.  Cardiovascular:     Rate and Rhythm: Normal rate and regular rhythm.     Pulses: Intact distal pulses.          Popliteal pulses are 2+ on the right side and 0 on the left side.       Dorsalis pedis pulses are 0 on the right side and 0 on the left side.       Posterior tibial pulses are  0 on the right side and 0 on the left side.     Heart sounds: Murmur heard.     Early systolic murmur is present with a grade of 2/6 at the upper right sternal border.     No gallop.  Pulmonary:     Effort: Pulmonary effort is normal.     Breath sounds: Normal breath sounds.  Abdominal:     General: Bowel sounds are normal.     Palpations: Abdomen is soft.  Musculoskeletal:     Right lower leg: No edema.     Left lower leg: No edema.    Medications and allergies   Allergies  Allergen Reactions   Lovastatin Rash     Medication list   Current Outpatient Medications:    acetaminophen (TYLENOL) 500 MG tablet, Take 1,000 mg by mouth every 6 (six) hours as needed for moderate pain., Disp: , Rfl:    amLODipine (NORVASC) 10 MG tablet, Take 10 mg by mouth every evening., Disp: , Rfl:    aspirin 81 MG chewable tablet, Chew 81 mg by mouth daily., Disp: , Rfl:    CINNAMON PO, Take 1 capsule by mouth daily. With chromium, Disp: , Rfl:    empagliflozin (JARDIANCE) 10 MG TABS tablet, Take 10 mg by mouth daily., Disp: , Rfl:    ezetimibe (ZETIA) 10 MG tablet, Take 1 tablet (10 mg total) by mouth every evening., Disp: 90 tablet, Rfl: 3   gabapentin (NEURONTIN) 300 MG capsule, Take 300 mg by mouth at bedtime., Disp: , Rfl:    ibuprofen (ADVIL) 200 MG tablet, Take 400 mg by mouth daily as needed for moderate pain., Disp: , Rfl:    insulin aspart (NOVOLOG) 100 UNIT/ML FlexPen, Inject 9 Units into the skin 3 (three) times daily as needed for high blood sugar., Disp: , Rfl:    KLOR-CON M20 20 MEQ tablet, Take 20 mEq by mouth daily., Disp: , Rfl: 0   LANTUS SOLOSTAR 100 UNIT/ML Solostar Pen, Inject 17 Units into the skin daily., Disp: , Rfl:    Liniments (BLUE-EMU SUPER STRENGTH) CREA, Apply 1 application. topically 3 (three) times daily., Disp: , Rfl:    losartan-hydrochlorothiazide (HYZAAR) 50-12.5 MG tablet, Take 1 tablet by mouth every morning., Disp: , Rfl:    omeprazole (PRILOSEC) 20 MG capsule,  Take 20 mg by mouth daily as needed (acid reflux)., Disp: , Rfl:    rosuvastatin (CRESTOR) 20 MG tablet, Take  1 tablet (20 mg total) by mouth daily., Disp: 90 tablet, Rfl: 3   terbinafine (LAMISIL) 250 MG tablet, 1 tablet Orally Once a day for 90 days for toenail fungus, Disp: , Rfl:    Alcohol Swabs (B-D SINGLE USE SWABS REGULAR) PADS, , Disp: , Rfl:    Blood Glucose Monitoring Suppl (TRUE METRIX AIR GLUCOSE METER) w/Device KIT, , Disp: , Rfl:    cilostazol (PLETAL) 50 MG tablet, Take 1 tablet (50 mg total) by mouth 2 (two) times daily., Disp: 180 tablet, Rfl: 3   Insulin Syringe-Needle U-100 31G X 5/16" 0.5 ML MISC, Use one syringe twice daily DX:  250.70, Disp: , Rfl:    TRUE METRIX BLOOD GLUCOSE TEST test strip, , Disp: , Rfl:    TRUEplus Lancets 33G MISC, , Disp: , Rfl:  Laboratory examination:   No results for input(s): "NA", "K", "CL", "CO2", "GLUCOSE", "BUN", "CREATININE", "CALCIUM", "GFRNONAA", "GFRAA" in the last 8760 hours.   Lab Results  Component Value Date   GLUCOSE 122 (H) 10/15/2021   NA 140 10/15/2021   K 5.0 10/15/2021   CL 105 10/15/2021   CO2 26 10/15/2021   BUN 25 (H) 10/15/2021   CREATININE 1.13 (H) 10/15/2021   GFRNONAA 51 (L) 10/15/2021   CALCIUM 9.2 10/15/2021   PROT 7.5 02/28/2019   ALBUMIN 3.1 (L) 02/28/2019   BILITOT 0.9 02/28/2019   ALKPHOS 72 02/28/2019   AST 23 02/28/2019   ALT 18 02/28/2019   ANIONGAP 9 10/15/2021      Lab Results  Component Value Date   ALT 18 02/28/2019   AST 23 02/28/2019   ALKPHOS 72 02/28/2019   BILITOT 0.9 02/28/2019   HEMOGLOBIN A1C Lab Results  Component Value Date   HGBA1C 9.1 (H) 02/26/2019   MPG 214.47 02/26/2019   Lab Results  Component Value Date   TSH 2.590 02/26/2019    External labs:   Labs 10/15/2022:  A1c 8.3%.  TSH normal at 3.44.  Total cholesterol 169, triglycerides 83, HDL 54, LDL 98.  Sodium 143, potassium 3.9, BUN 16, creatinine 1.15, EGFR 50 mL.  Serum glucose 101 mg.  ALT, AST normal.   Total CK elevated at 227 (25-210).  Labs 09/24/2022:  Hb 12.2/HCT 36.0, platelets 183.  Normal indicis.  Apolipoprotein B mildly elevated at 105 (49-90).  Total cholesterol 171, triglycerides 94, HDL 42, LDL 110.  Non-HDL cholesterol 129.  Serum glucose 164 mg, BUN 13, creatinine 1.1, EGFR 58 mL, potassium 4.0, LFTs normal.  Urine analysis negative for protein.  A1c 8.2%.  Radiology:    Cardiac Studies:   Echocardiogram 02/26/2019:  1. The left ventricle has normal systolic function with an ejection fraction of 60-65%. The cavity size was normal. Left ventricular diastolic Doppler parameters are consistent with impaired relaxation.  2. The right ventricle has normal systolic function. The cavity was normal. There is no increase in right ventricular wall thickness.  3. Left atrial size was moderately dilated.  Lower Extremity Arterial Duplex 05/05/2021: Right: Heterogenous plaque and medial calcifications noted.  No evidence of stenosis/occlusion in the CFA, DFA, SFA, popliteal artery or TPT.  Two vessel run-off via ATA and peroneal artery. The PTA is totally occluded.   Left: Heterogenous plaque and medial calcifications noted.  50-74% stenosis in the proximal SFA, distal to ostium, low end range.  Zero vessel run-off. Occlusion noted in the mid and distal PTA, distal  peroneal artery and a short segment occlusion in the mid ATA.    ABI  09/26/2021:  Peripheral angiogram 10/15/2021:  Status post ultrasound guided access right common femoral artery for pelvic and left lower extremity angiogram, with treatment of left anterior tibial artery with laser atherectomy and balloon angioplasty, restoring in-line flow to the forefoot.  Advanced tibial arterial disease, with occlusion of the tibioperoneal trunk distally and no filling of peroneal artery or posterior tibial artery proximally.  PCV MYOCARDIAL PERFUSION WITH LEXISCAN 11/08/2022  Narrative Regadenoson (with Mod Bruce  protocol) Nuclear stress test 11/08/2022 Myocardial perfusion is normal. Breast tissue attenuation is present. All perfusion is better in stress, doubt ischemia. Overall LV systolic function is normal without regional wall motion abnormalities. Stress LV EF: 58%. Low risk study. Nondiagnostic ECG stress. The heart rate response was consistent with Regadenoson. The blood pressure response was physiologic. No previous exam available for comparison.   PCV ECHOCARDIOGRAM COMPLETE 10/27/2022  Narrative Echocardiogram 10/27/2022: Left ventricle cavity is normal in size and wall thickness. Normal global wall motion. Normal LV systolic function with EF 72%. Doppler evidence of grade I (impaired) diastolic dysfunction, normal LAP. Left atrial cavity is mildly dilated. Trileaflet aortic valve.  Trace aortic regurgitation. Mild aortic valve leaflet calcification. Structurally normal tricuspid valve.  Moderate tricuspid regurgitation. Estimated pulmonary artery systolic pressure 39 mmHg.    Carotid artery duplex 10/27/2022: Duplex suggests stenosis in the right internal carotid artery (16-49%). <50%  stenosis in the right external carotid artery There is heterogeneous plaque. Duplex suggests stenosis in the left internal carotid artery (16-49%). <50%  stenosis in the left common carotid artery with homogeneous plaque. <50%  stenosis in the left external carotid artery. Antegrade right vertebral artery flow. Antegrade left vertebral artery flow. Follow up in one year is appropriate if clinically indicated.  Lower Extremity Arterial Duplex 10/27/2022: No hemodynamically significant stenosis are identified in the right or left lower extremity arterial system.  There is severely abnormal monophasic waveform pattern in vessels below knee suggests diffuse small vessel disease. This exam reveals mildly decreased perfusion of the right lower extremity, noted at the post tibial artery level (ABI 0.96).   This exam  reveals normal perfusion of the left lower extremity (ABI 0.97). EKG:   EKG 10/14/2022: Normal sinus rhythm at rate of 66 bpm, poor R progression, cannot exclude anteroseptal infarct old.  LVH.    Assessment     ICD-10-CM   1. Claudication in peripheral vascular disease (HCC)  I73.9 cilostazol (PLETAL) 50 MG tablet    2. Asymptomatic bilateral carotid artery stenosis  I65.23 PCV CAROTID DUPLEX (BILATERAL)    3. Hypercholesterolemia  E78.00 ezetimibe (ZETIA) 10 MG tablet    4. Type 2 diabetes mellitus with diabetic peripheral angiopathy without gangrene, with long-term current use of insulin (HCC)  E11.51    Z79.4        No orders of the defined types were placed in this encounter.  Meds ordered this encounter  Medications   cilostazol (PLETAL) 50 MG tablet    Sig: Take 1 tablet (50 mg total) by mouth 2 (two) times daily.    Dispense:  180 tablet    Refill:  3   ezetimibe (ZETIA) 10 MG tablet    Sig: Take 1 tablet (10 mg total) by mouth every evening.    Dispense:  90 tablet    Refill:  3   Medications Discontinued During This Encounter  Medication Reason   cilostazol (PLETAL) 50 MG tablet Reorder      Recommendations:   CARNELLA FRYMAN is a 77 y.o. African-American  female patient with hypertension, hypercholesterolemia, uncontrolled diabetes mellitus with hyperglycemia, peripheral arterial disease, lower extremity arterial duplex had revealed severe small vessel disease below the knee and eventually underwent left anterior tibial laser atherectomy followed by balloon angioplasty on 10/15/2021, presents for follow-up of peripheral arterial disease and cardiac risk stratification.  1. Claudication in peripheral vascular disease Symptoms of claudication are improved significantly, [facility] and also walking.  Continue with cilostazol.  Best option is medical therapy for small vessel disease.  I reviewed her ABI, echocardiogram and  nuclear stress test.  Fortunately low risk nuclear stress test.  - cilostazol (PLETAL) 50 MG tablet; Take 1 tablet (50 mg total) by mouth 2 (two) times daily.  Dispense: 180 tablet; Refill: 3  2. Asymptomatic bilateral carotid artery stenosis She has mild bilateral carotid artery stenosis, will recheck carotid duplex in 1 year.  - PCV CAROTID DUPLEX (BILATERAL); Future  3. Hypercholesterolemia LDL is not at goal, I will add Zetia 10 mg daily.  She will need repeat lipid profile testing and also Lp(a), will request Dr. Eloise Harman if he is planning on doing labs in the future otherwise I can order this on her next office visit in 3 months.  - ezetimibe (ZETIA) 10 MG tablet; Take 1 tablet (10 mg total) by mouth every evening.  Dispense: 90 tablet; Refill: 3  4. Type 2 diabetes mellitus with diabetic peripheral angiopathy without gangrene, with long-term current use of insulin Diabetes mellitus is well-controlled and she does have significant peripheral arterial disease and code consider Ozempic both from cardiovascular standpoint, weight loss and for diabetes control.  Otherwise stable from cardiac standpoint, I will see her back in 3 months for close monitoring.    Yates Decamp, MD, [facility] 11/25/2022, 9:12 PM Office: [phone]

## 2022-12-10 ENCOUNTER — Encounter: Payer: Self-pay | Admitting: Podiatry

## 2022-12-10 ENCOUNTER — Ambulatory Visit: Payer: Medicare HMO | Admitting: Podiatry

## 2022-12-10 ENCOUNTER — Ambulatory Visit (INDEPENDENT_AMBULATORY_CARE_PROVIDER_SITE_OTHER): Payer: Medicare HMO | Admitting: Podiatry

## 2022-12-10 DIAGNOSIS — M79674 Pain in right toe(s): Secondary | ICD-10-CM

## 2022-12-10 DIAGNOSIS — M79675 Pain in left toe(s): Secondary | ICD-10-CM | POA: Diagnosis not present

## 2022-12-10 DIAGNOSIS — I999 Unspecified disorder of circulatory system: Secondary | ICD-10-CM

## 2022-12-10 DIAGNOSIS — B351 Tinea unguium: Secondary | ICD-10-CM | POA: Diagnosis not present

## 2022-12-10 DIAGNOSIS — E1142 Type 2 diabetes mellitus with diabetic polyneuropathy: Secondary | ICD-10-CM | POA: Diagnosis not present

## 2022-12-10 NOTE — Progress Notes (Signed)
This patient returns to my office for at risk foot care.  This patient requires this care by a professional since this patient will be at risk due to having diabetes and PAD and vascular disease. She says she is taking new medicine which has helped reduce her foot pain.  This patient is unable to cut nails herself since the patient cannot reach her nails.These nails are painful walking and wearing shoes.  This patient presents for at risk foot care today.  General Appearance  Alert, conversant and in no acute stress.  Vascular  Dorsalis pedis and posterior tibial  pulses are  not palpable  bilaterally.  Capillary return is within normal limits  bilaterally. Cold feet.  bilaterally.  Absent hair.    Neurologic  Senn-Weinstein monofilament wire test within normal limits  bilaterally. Muscle power within normal limits bilaterally.  Nails Thick disfigured discolored nails with subungual debris  from hallux to fifth toes bilaterally. No evidence of bacterial infection or drainage bilaterally. Healing nail bed fifth toe right foot.  No infection noted.  Orthopedic  No limitations of motion  feet .  No crepitus or effusions noted.  No bony pathology or digital deformities noted.  Skin  normotropic skin with no porokeratosis noted bilaterally.  No signs of infections or ulcers noted.     Onychomycosis  Pain in right toes  Pain in left toes  Consent was obtained for treatment procedures.   Mechanical debridement of nails 1-5  bilaterally performed with a nail nipper.  Filed with dremel without incident.    Return office visit   10 weeks                    Told patient to return for periodic foot care and evaluation due to potential at risk complications.   Helane Gunther DPM

## 2022-12-14 DIAGNOSIS — N1831 Chronic kidney disease, stage 3a: Secondary | ICD-10-CM | POA: Diagnosis not present

## 2022-12-14 DIAGNOSIS — E785 Hyperlipidemia, unspecified: Secondary | ICD-10-CM | POA: Diagnosis not present

## 2022-12-14 DIAGNOSIS — I129 Hypertensive chronic kidney disease with stage 1 through stage 4 chronic kidney disease, or unspecified chronic kidney disease: Secondary | ICD-10-CM | POA: Diagnosis not present

## 2022-12-14 DIAGNOSIS — E1151 Type 2 diabetes mellitus with diabetic peripheral angiopathy without gangrene: Secondary | ICD-10-CM | POA: Diagnosis not present

## 2022-12-14 DIAGNOSIS — Z794 Long term (current) use of insulin: Secondary | ICD-10-CM | POA: Diagnosis not present

## 2022-12-29 ENCOUNTER — Encounter (INDEPENDENT_AMBULATORY_CARE_PROVIDER_SITE_OTHER): Payer: Medicare HMO | Admitting: Ophthalmology

## 2022-12-29 DIAGNOSIS — Z961 Presence of intraocular lens: Secondary | ICD-10-CM

## 2022-12-29 DIAGNOSIS — I10 Essential (primary) hypertension: Secondary | ICD-10-CM

## 2022-12-29 DIAGNOSIS — E113593 Type 2 diabetes mellitus with proliferative diabetic retinopathy without macular edema, bilateral: Secondary | ICD-10-CM

## 2022-12-29 DIAGNOSIS — H4310 Vitreous hemorrhage, unspecified eye: Secondary | ICD-10-CM

## 2022-12-29 DIAGNOSIS — H35033 Hypertensive retinopathy, bilateral: Secondary | ICD-10-CM

## 2023-01-05 ENCOUNTER — Telehealth: Payer: Self-pay | Admitting: Cardiology

## 2023-01-05 NOTE — Telephone Encounter (Signed)
Patient prescreened for EZEF Lp(a) trial.   12/17/22 Lp(a) = 239.7.    I discussed the EZEF trail with lepodisiran vs placebo with her and discussed differences between LDL vs Lp(a) in detail and the CV risk associated with both.  She initially declined but would like your input.   Do you think she would be a good study candidate?  Is there any way you could contact her and give her your input on her participating?    Thanks  Hazle Nordmann, PharmD, CPP Medication Management

## 2023-01-07 NOTE — Telephone Encounter (Signed)
Do you think you could give her a quick call to recommend the study?

## 2023-01-08 NOTE — Telephone Encounter (Signed)
She is primed about the study and can discuss

## 2023-01-12 NOTE — Telephone Encounter (Signed)
Spoke with patient again regarding trial.  She declines the EZEF study for right now.  Thanks for calling her Dr Jacinto Halim

## 2023-02-17 DIAGNOSIS — M65342 Trigger finger, left ring finger: Secondary | ICD-10-CM | POA: Diagnosis not present

## 2023-02-18 ENCOUNTER — Ambulatory Visit: Payer: Medicare HMO | Admitting: Podiatry

## 2023-02-24 ENCOUNTER — Ambulatory Visit: Payer: Medicare HMO | Admitting: Cardiology

## 2023-03-23 DIAGNOSIS — M79642 Pain in left hand: Secondary | ICD-10-CM | POA: Diagnosis not present

## 2023-03-23 DIAGNOSIS — I10 Essential (primary) hypertension: Secondary | ICD-10-CM | POA: Diagnosis not present

## 2023-03-23 DIAGNOSIS — E785 Hyperlipidemia, unspecified: Secondary | ICD-10-CM | POA: Diagnosis not present

## 2023-03-23 DIAGNOSIS — I739 Peripheral vascular disease, unspecified: Secondary | ICD-10-CM | POA: Diagnosis not present

## 2023-03-23 DIAGNOSIS — E1151 Type 2 diabetes mellitus with diabetic peripheral angiopathy without gangrene: Secondary | ICD-10-CM | POA: Diagnosis not present

## 2023-03-23 DIAGNOSIS — B3731 Acute candidiasis of vulva and vagina: Secondary | ICD-10-CM | POA: Diagnosis not present

## 2023-03-23 DIAGNOSIS — Z794 Long term (current) use of insulin: Secondary | ICD-10-CM | POA: Diagnosis not present

## 2023-03-23 DIAGNOSIS — M48062 Spinal stenosis, lumbar region with neurogenic claudication: Secondary | ICD-10-CM | POA: Diagnosis not present

## 2023-03-23 DIAGNOSIS — B351 Tinea unguium: Secondary | ICD-10-CM | POA: Diagnosis not present

## 2023-04-12 DIAGNOSIS — E119 Type 2 diabetes mellitus without complications: Secondary | ICD-10-CM | POA: Diagnosis not present

## 2023-05-09 DIAGNOSIS — N390 Urinary tract infection, site not specified: Secondary | ICD-10-CM | POA: Diagnosis not present

## 2023-05-09 DIAGNOSIS — R300 Dysuria: Secondary | ICD-10-CM | POA: Diagnosis not present

## 2023-05-09 DIAGNOSIS — B3731 Acute candidiasis of vulva and vagina: Secondary | ICD-10-CM | POA: Diagnosis not present

## 2023-05-09 DIAGNOSIS — E1169 Type 2 diabetes mellitus with other specified complication: Secondary | ICD-10-CM | POA: Diagnosis not present

## 2023-05-20 DIAGNOSIS — R82998 Other abnormal findings in urine: Secondary | ICD-10-CM | POA: Diagnosis not present

## 2023-06-14 DIAGNOSIS — Z794 Long term (current) use of insulin: Secondary | ICD-10-CM | POA: Diagnosis not present

## 2023-06-14 DIAGNOSIS — E785 Hyperlipidemia, unspecified: Secondary | ICD-10-CM | POA: Diagnosis not present

## 2023-06-14 DIAGNOSIS — H5789 Other specified disorders of eye and adnexa: Secondary | ICD-10-CM | POA: Diagnosis not present

## 2023-06-14 DIAGNOSIS — E1151 Type 2 diabetes mellitus with diabetic peripheral angiopathy without gangrene: Secondary | ICD-10-CM | POA: Diagnosis not present

## 2023-06-14 DIAGNOSIS — I129 Hypertensive chronic kidney disease with stage 1 through stage 4 chronic kidney disease, or unspecified chronic kidney disease: Secondary | ICD-10-CM | POA: Diagnosis not present

## 2023-06-14 DIAGNOSIS — N1831 Chronic kidney disease, stage 3a: Secondary | ICD-10-CM | POA: Diagnosis not present

## 2023-06-16 NOTE — Progress Notes (Signed)
[facility] - Clinic Note  06/17/2023     CHIEF COMPLAINT Patient presents for Retina Follow Up   HISTORY OF PRESENT ILLNESS: Catherine Ramsey is a 77 y.o. female who presents to the clinic today for:   HPI     Retina Follow Up   Patient presents with  Diabetic Retinopathy.  This started years ago.  Duration of 2 months.  I, the attending physician,  performed the HPI with the patient and updated documentation appropriately.        Comments   Patient states the vision in the right eye looks like there is hair over it. She is not using eye drops. Her blood sugar was 146.       Last edited by Rennis Chris, MD on 06/17/2023  2:49 PM.    Patient states the vision was doing well and now its back to seeing hair like floaters in vision. Started last week and has been sleeping with her head elevated.   Referring physician: Garlan Fillers, MD [address] [place],  [place] [address]  HISTORICAL INFORMATION:   Selected notes from the MEDICAL RECORD NUMBER Referred by Dr. Yetta Barre for [facility] OD LEE:  Ocular Hx- PMH-    CURRENT MEDICATIONS: No current outpatient medications on file. (Ophthalmic Drugs)   No current facility-administered medications for this visit. (Ophthalmic Drugs)   Current Outpatient Medications (Other)  Medication Sig   acetaminophen (TYLENOL) 500 MG tablet Take 1,000 mg by mouth every 6 (six) hours as needed for moderate pain.   Alcohol Swabs (B-D SINGLE USE SWABS REGULAR) PADS    amLODipine (NORVASC) 10 MG tablet Take 10 mg by mouth every evening.   aspirin 81 MG chewable tablet Chew 81 mg by mouth daily.   Blood Glucose Monitoring Suppl (TRUE METRIX AIR GLUCOSE METER) w/Device KIT    cilostazol (PLETAL) 50 MG tablet Take 1 tablet (50 mg total) by mouth 2 (two) times daily.   CINNAMON PO Take 1 capsule by mouth daily. With chromium   empagliflozin (JARDIANCE) 10 MG TABS tablet Take 10 mg by mouth daily.   ezetimibe (ZETIA) 10 MG  tablet Take 1 tablet (10 mg total) by mouth every evening.   gabapentin (NEURONTIN) 300 MG capsule Take 300 mg by mouth at bedtime.   ibuprofen (ADVIL) 200 MG tablet Take 400 mg by mouth daily as needed for moderate pain.   insulin aspart (NOVOLOG) 100 UNIT/ML FlexPen Inject 9 Units into the skin 3 (three) times daily as needed for high blood sugar.   Insulin Syringe-Needle U-100 31G X 5/16" 0.5 ML MISC Use one syringe twice daily DX:  250.70   KLOR-CON M20 20 MEQ tablet Take 20 mEq by mouth daily.   LANTUS SOLOSTAR 100 UNIT/ML Solostar Pen Inject 17 Units into the skin daily.   Liniments (BLUE-EMU SUPER STRENGTH) CREA Apply 1 application. topically 3 (three) times daily.   losartan-hydrochlorothiazide (HYZAAR) 50-12.5 MG tablet Take 1 tablet by mouth every morning.   omeprazole (PRILOSEC) 20 MG capsule Take 20 mg by mouth daily as needed (acid reflux).   rosuvastatin (CRESTOR) 20 MG tablet Take 1 tablet (20 mg total) by mouth daily.   terbinafine (LAMISIL) 250 MG tablet 1 tablet Orally Once a day for 90 days for toenail fungus   TRUE METRIX BLOOD GLUCOSE TEST test strip    TRUEplus Lancets 33G MISC    No current facility-administered medications for this visit. (Other)   REVIEW OF SYSTEMS: ROS  Positive for: Endocrine, Eyes Negative for: Constitutional, Gastrointestinal, Neurological, Skin, Genitourinary, Musculoskeletal, HENT, Cardiovascular, Respiratory, Psychiatric, Allergic/Imm, Heme/Lymph Last edited by Charlette Caffey, COT on 06/17/2023  9:12 AM.     ALLERGIES Allergies  Allergen Reactions   Lovastatin Rash   PAST MEDICAL HISTORY Past Medical History:  Diagnosis Date   Achalasia - Type II 05/08/2010   Qualifier: Diagnosis of  By: Leone Payor MD, Alfonse Ras E    Acute kidney injury (HCC) 02/26/2019   Candida esophagitis (HCC) 03/26/2014   Cataract    COVID-19 virus detected 02/25/2019   Diabetes mellitus    type 2   Family history of adverse reaction to anesthesia     son hard to wake up   GERD (gastroesophageal reflux disease)    Hyperlipidemia    Hypertension    Primary hypertension 02/25/2019   Syncope 02/25/2019   Trigger ring finger of right hand 10/07/2016   Past Surgical History:  Procedure Laterality Date   ABDOMINAL HYSTERECTOMY     BREAST BIOPSY     left/benign   COLONOSCOPY  01/31/2014   ESOPHAGEAL MANOMETRY N/A 04/01/2014   Procedure: ESOPHAGEAL MANOMETRY (EM);  Surgeon: Iva Boop, MD;  Location: [facility];  Service: Endoscopy;  Laterality: N/A;   HELLER MYOTOMY N/A 12/07/2016   Procedure: LAPAROSCOPIC HELLER MYOTOMY, DOR FUNDOPLICATION, HIATAL HERNIA REPAIR, INTRAOPERATIVE UPPER ENDOSCOPY;  Surgeon: Avel Peace, MD;  Location: [facility] ORS;  Service: General;  Laterality: N/A;   IR ANGIOGRAM PELVIS SELECTIVE OR SUPRASELECTIVE  10/20/2021   IR RADIOLOGIST EVAL & MGMT  10/01/2021   IR RADIOLOGIST EVAL & MGMT  11/25/2021   IR RADIOLOGIST EVAL & MGMT  11/04/2022   IR TIB-PERO ART ATHEREC INC PTA MOD SED  10/15/2021   IR US GUIDE VASC ACCESS RIGHT  10/15/2021   TOTAL HIP ARTHROPLASTY     right pin then replacement   UPPER GASTROINTESTINAL ENDOSCOPY     FAMILY HISTORY Family History  Problem Relation Age of Onset   Diabetes Mother        died at 67 from Covid-19   Hypertension Mother    Hyperlipidemia Mother    Breast cancer Mother    Diabetes Father    Hypertension Father    Hypertension Sister    Hypertension Sister    Colon polyps Daughter    Colon cancer Neg Hx    Esophageal cancer Neg Hx    Rectal cancer Neg Hx    Stomach cancer Neg Hx    SOCIAL HISTORY Social History   Tobacco Use   Smoking status: Never   Smokeless tobacco: Never  Vaping Use   Vaping status: Never Used  Substance Use Topics   Alcohol use: No   Drug use: No       OPHTHALMIC EXAM:  Base Eye Exam     Visual Acuity (Snellen - Linear)       Right Left   Dist [place] 20/40 -2 20/20   Dist ph [place] 20/30 +1          Tonometry (Tonopen,  9:17 AM)       Right Left   Pressure 15 17         Pupils       Dark Light Shape React APD   Right 4 3 Round Brisk None   Left 4 3 Round Brisk None         Visual Fields       Left Right    Full Full  Extraocular Movement       Right Left    Full, Ortho Full, Ortho         Neuro/Psych     Oriented x3: Yes   Mood/Affect: Normal         Dilation     Both eyes: 1.0% Mydriacyl, 2.5% Phenylephrine @ 9:13 AM           Slit Lamp and Fundus Exam     External Exam       Right Left   External Normal Normal         Slit Lamp Exam       Right Left   Lids/Lashes Dermatochalasis - upper lid Dermatochalasis - upper lid   Conjunctiva/Sclera Melanosis, nasal and temporal pinguecula Melanosis, nasal and temporal pinguecula   Cornea arcus, well healed cataract wound, tear film debris, 2+ inferior Punctate epithelial erosions arcus, well healed cataract wound, trace PEE, tear film debris, fine endo pigment inferiorly   Anterior Chamber Deep and Clear Deep and Clear   Iris Round and dilated, No NVI Round and dilated, No NVI   Lens PC IOL in good position, pigment/RBC on post optic PC IOL in good position,    Anterior Vitreous Syneresis, +RBC's anterior vit, Posterior vitreous detachment, blood stained vitreous condensation greatest inferiorly, focal tractional fibrosis superior equator/12 Syneresis, blood stained vitreous condensations- cleared         Fundus Exam       Right Left   Disc Hazy view, Pink and sharp, mild pallor, sharp rim, mild PPP Mild pallor, sharp rim, Mild PPP   C/D Ratio 0.2 0.3   Macula Hazy view, Flat, Blunted foveal reflex, No heme or edema Good foveal reflex, mild ERM, scattered MA, no edema   Vessels Attenuated, tortuous, severe attenuation IN arcades Attenuated, copper wiring, mild tortuosity   Periphery Attached, scatterd MA/DBH, focal fibrosis at 1130, 360 PRP room for fil in Attached, scattered MA/DBH, good 360 PRP  changes, focal fibrosis nasal midzone           IMAGING AND PROCEDURES  Imaging and Procedures for 06/17/2023  OCT, Retina - OU - Both Eyes       Right Eye Quality was borderline. Central Foveal Thickness: 254. Progression has worsened. Findings include normal foveal contour, no IRF, no SRF ( interval increase in vitreous opacities).   Left Eye Quality was borderline. Central Foveal Thickness: 233. Progression has improved. Findings include normal foveal contour, no IRF, no SRF, preretinal fibrosis (Partial PVD, PRF over disc, mild interval improvement in vitreous opacities).   Notes *Images captured and stored on drive  Diagnosis / Impression:  NFP, no IRF/SRF OU OD:  interval increase in vitreous opacities OS: Partial PVD, PRF over disc, mild interval improvement in vitreous opacities  Clinical management:  See below  Abbreviations: NFP - Normal foveal profile. CME - cystoid macular edema. PED - pigment epithelial detachment. IRF - intraretinal fluid. SRF - subretinal fluid. EZ - ellipsoid zone. ERM - epiretinal membrane. ORA - outer retinal atrophy. ORT - outer retinal tubulation. SRHM - subretinal hyper-reflective material. IRHM - intraretinal hyper-reflective material      Intravitreal Injection, Pharmacologic Agent - OD - Right Eye       Time Out 06/17/2023. 10:42 AM. Confirmed correct patient, procedure, site, and patient consented.   Anesthesia Topical anesthesia was used. Anesthetic medications included Lidocaine 2%, Proparacaine 0.5%.   Procedure Preparation included 5% betadine to ocular surface, eyelid speculum. A (32g) needle was used.  Injection: 1.25 mg Bevacizumab 1.25mg /0.35ml   Route: Intravitreal, Site: Right Eye   NDC: [id number], Lot: [id number], Expiration date: 09/19/2023   Post-op Post injection exam found visual acuity of at least counting fingers. The patient tolerated the procedure well. There were no complications. The patient received  written and verbal post procedure care education. Post injection medications were not given.            ASSESSMENT/PLAN:    ICD-10-CM   1. Proliferative diabetic retinopathy of both eyes without macular edema associated with type 2 diabetes mellitus (HCC)  E11.3593 OCT, Retina - OU - Both Eyes    Intravitreal Injection, Pharmacologic Agent - OD - Right Eye    Bevacizumab (AVASTIN) SOLN 1.25 mg    2. Vitreous hemorrhage due to proliferative diabetic retinopathy (HCC)  E11.3599 OCT, Retina - OU - Both Eyes   H43.10     3. Essential hypertension  I10     4. Hypertensive retinopathy of both eyes  H35.033     5. Pseudophakia, both eyes  Z96.1      1,2. Proliferative diabetic retinopathy OU, w/ h/o VH OD  - pt lost to f/u from 03.22.24 to 11.15.24 (~8 mos) -- presents acutely for new floaters and dec VA OD  - pt lost to f/u from 01.08.24 to 03.22.24 (~2.5 mos) - pt presents acutely for new floaters and blurred vision OS -- onset Monday, 03.18.24 - FA (11.28.23) shows OD: Multiple patches NVE nasal hemisphere. Scattered blockage from diffuse VH. OS: Scattered patches of NVE greatest nasal hemisphere. Large area of vascular non profusion, nasal periphery. **pt will need PRP OU** - s/p IVA OD #1 (11.28.23), #2 (12.28.23) - s/p IVA OS #1 (03.22.24) - s/p PRP OS (12.12.23)  - s/p PRP OD (01.08.24) - BCVA 20/30 OU - exam shows blood stained vit condensations OD -- recurrent - OCT shows OD: interval increase in vitreous opacities; OS: Partial PVD, PRF over disc, mild interval improvement in vitreous opacities - discussed findings and prognosis - recommend IVA OD #3 today, 11.14.24  - pt wishes to proceed with injection - RBA of procedure discussed, questions answered - informed consent obtained and signed - see procedure note - informed consent obtained and signed for Avastin, 11.28.23 (OD) - informed consent obtained and signed for Avastin, 03.22.24 (OS) - VH precautions reviewed --  minimize activities, keep head elevated, avoid ASA/NSAIDs/blood thinners as able  - f/u 4 weeks, DFE, OCT   3,4. Hypertensive retinopathy OU - discussed importance of tight BP control - monitor  5. Pseudophakia OU  - s/p CE/IOL OU (Dr. Wynelle Link)  - IOLs in good position, doing well  - monitor  Ophthalmic Meds Ordered this visit:  Meds ordered this encounter  Medications   Bevacizumab (AVASTIN) SOLN 1.25 mg     Return in about 4 weeks (around 07/15/2023) for f/u PDR OU , DFE, OCT, Possible, IVA.  There are no Patient Instructions on file for this visit.  This document serves as a record of services personally performed by Karie Chimera, MD, PhD. It was created on their behalf by Berlin Hun COT, an ophthalmic technician. The creation of this record is the provider's dictation and/or activities during the visit.    Electronically signed by: Berlin Hun COT 11.14.24 1:21 AM  This document serves as a record of services personally performed by Karie Chimera, MD, PhD. It was created on their behalf by Charlette Caffey, COT an ophthalmic technician. The creation of this record  is the provider's dictation and/or activities during the visit.    Electronically signed by:  Charlette Caffey, COT  06/19/23 1:21 AM  Karie Chimera, M.D., Ph.D. Diseases & Surgery of the Retina and Vitreous [facility] 06/17/2023   I have reviewed the above documentation for accuracy and completeness, and I agree with the above. Karie Chimera, M.D., Ph.D. 06/19/23 1:26 AM  Abbreviations: M myopia (nearsighted); A astigmatism; H hyperopia (farsighted); P presbyopia; Mrx spectacle prescription;  CTL contact lenses; OD right eye; OS left eye; OU both eyes  XT exotropia; ET esotropia; PEK punctate epithelial keratitis; PEE punctate epithelial erosions; DES dry eye syndrome; MGD meibomian gland dysfunction; ATs artificial tears; PFAT's preservative free artificial  tears; NSC nuclear sclerotic cataract; PSC posterior subcapsular cataract; ERM epi-retinal membrane; PVD posterior vitreous detachment; RD retinal detachment; DM diabetes mellitus; DR diabetic retinopathy; NPDR non-proliferative diabetic retinopathy; PDR proliferative diabetic retinopathy; CSME clinically significant macular edema; DME diabetic macular edema; dbh dot blot hemorrhages; CWS cotton wool spot; POAG primary open angle glaucoma; C/D cup-to-disc ratio; HVF humphrey visual field; GVF goldmann visual field; OCT optical coherence tomography; IOP intraocular pressure; BRVO Branch retinal vein occlusion; CRVO central retinal vein occlusion; CRAO central retinal artery occlusion; BRAO branch retinal artery occlusion; RT retinal tear; SB scleral buckle; PPV pars plana vitrectomy; VH Vitreous hemorrhage; PRP panretinal laser photocoagulation; IVK intravitreal kenalog; VMT vitreomacular traction; MH Macular hole;  NVD neovascularization of the disc; NVE neovascularization elsewhere; AREDS age related eye disease study; ARMD age related macular degeneration; POAG primary open angle glaucoma; EBMD epithelial/anterior basement membrane dystrophy; ACIOL anterior chamber intraocular lens; IOL intraocular lens; PCIOL posterior chamber intraocular lens; Phaco/IOL phacoemulsification with intraocular lens placement; PRK photorefractive keratectomy; LASIK laser assisted in situ keratomileusis; HTN hypertension; DM diabetes mellitus; COPD chronic obstructive pulmonary disease

## 2023-06-17 ENCOUNTER — Encounter (INDEPENDENT_AMBULATORY_CARE_PROVIDER_SITE_OTHER): Payer: Self-pay | Admitting: Ophthalmology

## 2023-06-17 ENCOUNTER — Ambulatory Visit (INDEPENDENT_AMBULATORY_CARE_PROVIDER_SITE_OTHER): Payer: Medicare HMO | Admitting: Ophthalmology

## 2023-06-17 DIAGNOSIS — E113593 Type 2 diabetes mellitus with proliferative diabetic retinopathy without macular edema, bilateral: Secondary | ICD-10-CM | POA: Diagnosis not present

## 2023-06-17 DIAGNOSIS — I10 Essential (primary) hypertension: Secondary | ICD-10-CM

## 2023-06-17 DIAGNOSIS — H4310 Vitreous hemorrhage, unspecified eye: Secondary | ICD-10-CM | POA: Diagnosis not present

## 2023-06-17 DIAGNOSIS — H35033 Hypertensive retinopathy, bilateral: Secondary | ICD-10-CM | POA: Diagnosis not present

## 2023-06-17 DIAGNOSIS — E113599 Type 2 diabetes mellitus with proliferative diabetic retinopathy without macular edema, unspecified eye: Secondary | ICD-10-CM

## 2023-06-17 DIAGNOSIS — Z961 Presence of intraocular lens: Secondary | ICD-10-CM | POA: Diagnosis not present

## 2023-06-17 MED ORDER — BEVACIZUMAB CHEMO INJECTION 1.25MG/0.05ML SYRINGE FOR KALEIDOSCOPE
1.2500 mg | INTRAVITREAL | Status: AC | PRN
Start: 2023-06-17 — End: 2023-06-17
  Administered 2023-06-17: 1.25 mg via INTRAVITREAL

## 2023-07-14 NOTE — Progress Notes (Signed)
[facility] - Clinic Note  07/15/2023     CHIEF COMPLAINT Patient presents for Retina Follow Up   HISTORY OF PRESENT ILLNESS: Catherine Ramsey is a 77 y.o. female who presents to the clinic today for:   HPI     Retina Follow Up   Patient presents with  Diabetic Retinopathy.  In both eyes.  Severity is moderate.  Duration of 4 weeks.  Since onset it is stable.  I, the attending physician,  performed the HPI with the patient and updated documentation appropriately.        Comments   Pt here for 4 wk ret f/u NPDR OU. Pt states VA may be a bit better. Last BS check was 156 last night.       Last edited by Rennis Chris, MD on 07/15/2023  3:51 PM.     Patient states she is not seeing the streaks in her right eye like she has been, but she feels  Referring physician: Garlan Fillers, MD [address] [place],  [place] [address]  HISTORICAL INFORMATION:   Selected notes from the MEDICAL RECORD NUMBER Referred by Dr. Yetta Barre for [facility] OD LEE:  Ocular Hx- PMH-    CURRENT MEDICATIONS: No current outpatient medications on file. (Ophthalmic Drugs)   No current facility-administered medications for this visit. (Ophthalmic Drugs)   Current Outpatient Medications (Other)  Medication Sig   acetaminophen (TYLENOL) 500 MG tablet Take 1,000 mg by mouth every 6 (six) hours as needed for moderate pain.   Alcohol Swabs (B-D SINGLE USE SWABS REGULAR) PADS    amLODipine (NORVASC) 10 MG tablet Take 10 mg by mouth every evening.   aspirin 81 MG chewable tablet Chew 81 mg by mouth daily.   Blood Glucose Monitoring Suppl (TRUE METRIX AIR GLUCOSE METER) w/Device KIT    cilostazol (PLETAL) 50 MG tablet Take 1 tablet (50 mg total) by mouth 2 (two) times daily.   CINNAMON PO Take 1 capsule by mouth daily. With chromium   empagliflozin (JARDIANCE) 10 MG TABS tablet Take 10 mg by mouth daily.   ezetimibe (ZETIA) 10 MG tablet Take 1 tablet (10 mg total) by mouth every evening.    gabapentin (NEURONTIN) 300 MG capsule Take 300 mg by mouth at bedtime.   ibuprofen (ADVIL) 200 MG tablet Take 400 mg by mouth daily as needed for moderate pain.   insulin aspart (NOVOLOG) 100 UNIT/ML FlexPen Inject 9 Units into the skin 3 (three) times daily as needed for high blood sugar.   Insulin Syringe-Needle U-100 31G X 5/16" 0.5 ML MISC Use one syringe twice daily DX:  250.70   KLOR-CON M20 20 MEQ tablet Take 20 mEq by mouth daily.   LANTUS SOLOSTAR 100 UNIT/ML Solostar Pen Inject 17 Units into the skin daily.   Liniments (BLUE-EMU SUPER STRENGTH) CREA Apply 1 application. topically 3 (three) times daily.   losartan-hydrochlorothiazide (HYZAAR) 50-12.5 MG tablet Take 1 tablet by mouth every morning.   omeprazole (PRILOSEC) 20 MG capsule Take 20 mg by mouth daily as needed (acid reflux).   rosuvastatin (CRESTOR) 20 MG tablet Take 1 tablet (20 mg total) by mouth daily.   terbinafine (LAMISIL) 250 MG tablet 1 tablet Orally Once a day for 90 days for toenail fungus   TRUE METRIX BLOOD GLUCOSE TEST test strip    TRUEplus Lancets 33G MISC    No current facility-administered medications for this visit. (Other)   REVIEW OF SYSTEMS: ROS   Positive for: Endocrine,  Eyes Negative for: Constitutional, Gastrointestinal, Neurological, Skin, Genitourinary, Musculoskeletal, HENT, Cardiovascular, Respiratory, Psychiatric, Allergic/Imm, Heme/Lymph Last edited by Thompson Grayer, COT on 07/15/2023  9:07 AM.      ALLERGIES Allergies  Allergen Reactions   Lovastatin Rash   PAST MEDICAL HISTORY Past Medical History:  Diagnosis Date   Achalasia - Type II 05/08/2010   Qualifier: Diagnosis of  By: Leone Payor MD, Alfonse Ras E    Acute kidney injury (HCC) 02/26/2019   Candida esophagitis (HCC) 03/26/2014   Cataract    COVID-19 virus detected 02/25/2019   Diabetes mellitus    type 2   Family history of adverse reaction to anesthesia    son hard to wake up   GERD (gastroesophageal reflux  disease)    Hyperlipidemia    Hypertension    Primary hypertension 02/25/2019   Syncope 02/25/2019   Trigger ring finger of right hand 10/07/2016   Past Surgical History:  Procedure Laterality Date   ABDOMINAL HYSTERECTOMY     BREAST BIOPSY     left/benign   COLONOSCOPY  01/31/2014   ESOPHAGEAL MANOMETRY N/A 04/01/2014   Procedure: ESOPHAGEAL MANOMETRY (EM);  Surgeon: Iva Boop, MD;  Location: [facility];  Service: Endoscopy;  Laterality: N/A;   HELLER MYOTOMY N/A 12/07/2016   Procedure: LAPAROSCOPIC HELLER MYOTOMY, DOR FUNDOPLICATION, HIATAL HERNIA REPAIR, INTRAOPERATIVE UPPER ENDOSCOPY;  Surgeon: Avel Peace, MD;  Location: [facility]S;  Service: General;  Laterality: N/A;   IR ANGIOGRAM PELVIS SELECTIVE OR SUPRASELECTIVE  10/20/2021   IR RADIOLOGIST EVAL & MGMT  10/01/2021   IR RADIOLOGIST EVAL & MGMT  11/25/2021   IR RADIOLOGIST EVAL & MGMT  11/04/2022   IR TIB-PERO ART ATHEREC INC PTA MOD SED  10/15/2021   IR US GUIDE VASC ACCESS RIGHT  10/15/2021   TOTAL HIP ARTHROPLASTY     right pin then replacement   UPPER GASTROINTESTINAL ENDOSCOPY     FAMILY HISTORY Family History  Problem Relation Age of Onset   Diabetes Mother        died at 67 from Covid-19   Hypertension Mother    Hyperlipidemia Mother    Breast cancer Mother    Diabetes Father    Hypertension Father    Hypertension Sister    Hypertension Sister    Colon polyps Daughter    Colon cancer Neg Hx    Esophageal cancer Neg Hx    Rectal cancer Neg Hx    Stomach cancer Neg Hx    SOCIAL HISTORY Social History   Tobacco Use   Smoking status: Never   Smokeless tobacco: Never  Vaping Use   Vaping status: Never Used  Substance Use Topics   Alcohol use: No   Drug use: No       OPHTHALMIC EXAM:  Base Eye Exam     Visual Acuity (Snellen - Linear)       Right Left   Dist [place] 20/40 +2 20/25 -2   Dist ph [place] 20/25 -2 20/25         Tonometry (Tonopen, 9:13 AM)       Right Left   Pressure 12 13          Pupils       Dark Light Shape React APD   Right 4 3 Round Brisk None   Left 3 3 Round NR None         Visual Fields (Counting fingers)       Left Right    Full Full  Extraocular Movement       Right Left    Full, Ortho Full, Ortho         Neuro/Psych     Oriented x3: Yes   Mood/Affect: Normal         Dilation     Both eyes: 1.0% Mydriacyl, 2.5% Phenylephrine @ 9:14 AM           Slit Lamp and Fundus Exam     External Exam       Right Left   External Normal Normal         Slit Lamp Exam       Right Left   Lids/Lashes Dermatochalasis - upper lid Dermatochalasis - upper lid   Conjunctiva/Sclera Melanosis, nasal and temporal pinguecula Melanosis, nasal and temporal pinguecula   Cornea arcus, well healed cataract wound, tear film debris, 2+ inferior Punctate epithelial erosions arcus, well healed cataract wound, trace PEE, tear film debris, fine endo pigment inferiorly   Anterior Chamber Deep and Clear Deep and Clear   Iris Round and dilated, No NVI Round and dilated, No NVI   Lens PC IOL in good position, pigment/RBC on post optic PC IOL in good position,    Anterior Vitreous Syneresis, +RBC's anterior vit -- improved, Posterior vitreous detachment, blood stained vitreous condensation greatest inferiorly, focal tractional fibrosis superior equator Syneresis, blood stained vitreous condensations -- slightly increased         Fundus Exam       Right Left   Disc Hazy view, Pink and sharp, mild pallor, sharp rim, mild PPP Mild pallor, sharp rim, Mild PPP   C/D Ratio 0.2 0.3   Macula Flat, good foveal reflex, No heme or edema Good foveal reflex, mild ERM, scattered MA, no edema   Vessels Attenuated, tortuous, severe attenuation IN arcades Attenuated, mild tortuosity   Periphery Attached, scatterd MA/DBH, focal fibrosis at 1130, 360 PRP room for fil in Attached, scattered MA/DBH, good 360 PRP changes, focal fibrosis nasal midzone,  linear pre-retinal heme inferior midzone           IMAGING AND PROCEDURES  Imaging and Procedures for 07/15/2023  OCT, Retina - OU - Both Eyes       Right Eye Quality was borderline. Central Foveal Thickness: 227. Progression has improved. Findings include normal foveal contour, no IRF, no SRF (interval improvement in vitreous opacities).   Left Eye Quality was borderline. Central Foveal Thickness: 235. Progression has worsened. Findings include normal foveal contour, no IRF, no SRF, preretinal fibrosis (Partial PVD, PRF over disc, mild interval increase in vitreous opacities).   Notes *Images captured and stored on drive  Diagnosis / Impression:  NFP, no IRF/SRF OU OD: interval improvement in vitreous opacities OS: Partial PVD, PRF over disc, mild interval increase in vitreous opacities  Clinical management:  See below  Abbreviations: NFP - Normal foveal profile. CME - cystoid macular edema. PED - pigment epithelial detachment. IRF - intraretinal fluid. SRF - subretinal fluid. EZ - ellipsoid zone. ERM - epiretinal membrane. ORA - outer retinal atrophy. ORT - outer retinal tubulation. SRHM - subretinal hyper-reflective material. IRHM - intraretinal hyper-reflective material      Intravitreal Injection, Pharmacologic Agent - OD - Right Eye       Time Out 07/15/2023. 10:00 AM. Confirmed correct patient, procedure, site, and patient consented.   Anesthesia Topical anesthesia was used. Anesthetic medications included Lidocaine 2%, Proparacaine 0.5%.   Procedure Preparation included 5% betadine to ocular surface, eyelid speculum. A (32g) needle  was used.   Injection: 1.25 mg Bevacizumab 1.25mg /0.79ml   Route: Intravitreal, Site: Right Eye   NDC: P3213405, Lot: [id number], Expiration date: 10/17/2023   Post-op Post injection exam found visual acuity of at least counting fingers. The patient tolerated the procedure well. There were no complications. The patient received  written and verbal post procedure care education. Post injection medications were not given.      Intravitreal Injection, Pharmacologic Agent - OS - Left Eye       Time Out 07/15/2023. 10:14 AM. Confirmed correct patient, procedure, site, and patient consented.   Anesthesia Topical anesthesia was used. Anesthetic medications included Lidocaine 2%, Proparacaine 0.5%.   Procedure Preparation included 5% betadine to ocular surface, eyelid speculum. A (32g) needle was used.   Injection: 1.25 mg Bevacizumab 1.25mg /0.38ml   Route: Intravitreal, Site: Left Eye   NDC: P3213405, Lot: [id number], Expiration date: 10/10/2023   Post-op Post injection exam found visual acuity of at least counting fingers. The patient tolerated the procedure well. There were no complications. The patient received written and verbal post procedure care education.            ASSESSMENT/PLAN:    ICD-10-CM   1. Proliferative diabetic retinopathy of both eyes without macular edema associated with type 2 diabetes mellitus (HCC)  E11.3593 OCT, Retina - OU - Both Eyes    Intravitreal Injection, Pharmacologic Agent - OD - Right Eye    Intravitreal Injection, Pharmacologic Agent - OS - Left Eye    Bevacizumab (AVASTIN) SOLN 1.25 mg    Bevacizumab (AVASTIN) SOLN 1.25 mg    2. Vitreous hemorrhage due to proliferative diabetic retinopathy (HCC)  E11.3599 Intravitreal Injection, Pharmacologic Agent - OD - Right Eye   H43.10 Intravitreal Injection, Pharmacologic Agent - OS - Left Eye    Bevacizumab (AVASTIN) SOLN 1.25 mg    Bevacizumab (AVASTIN) SOLN 1.25 mg    3. Essential hypertension  I10     4. Hypertensive retinopathy of both eyes  H35.033     5. Pseudophakia, both eyes  Z96.1      1,2. Proliferative diabetic retinopathy OU, w/ h/o VH OD  - pt lost to f/u from 03.22.24 to 11.15.24 (~8 mos) -- presents acutely for new floaters and dec VA OD  - pt lost to f/u from 01.08.24 to 03.22.24 (~2.5 mos) - pt  presents acutely for new floaters and blurred vision OS -- onset Monday, 03.18.24 - FA (11.28.23) shows OD: Multiple patches NVE nasal hemisphere. Scattered blockage from diffuse VH. OS: Scattered patches of NVE greatest nasal hemisphere. Large area of vascular non profusion, nasal periphery. **pt will need PRP OU** - s/p IVA OD #1 (11.28.23), #2 (12.28.23), #3 (11.14.24) - s/p IVA OS #1 (03.22.24) - s/p PRP OS (12.12.23)  - s/p PRP OD (01.08.24) - BCVA 20/25 OU -- improved - OCT shows OD: interval improvement in vitreous opacities; OS: Partial PVD, PRF over disc, mild interval increase in vitreous opacities - discussed findings and prognosis - recommend IVA OD #4 and IVA OS #2 today, 12.13.24, w/ f/u in 4 wks - pt wishes to proceed with injections - RBA of procedure discussed, questions answered - informed consent obtained and signed for Avastin, 12.13.24 (OU) - see procedure note - VH precautions reviewed -- minimize activities, keep head elevated, avoid ASA/NSAIDs/blood thinners as able  - f/u 4 weeks, DFE, OCT   3,4. Hypertensive retinopathy OU - discussed importance of tight BP control - monitor  5. Pseudophakia OU  - s/p CE/IOL  OU (Dr. Wynelle Link)  - IOLs in good position, doing well  - monitor  Ophthalmic Meds Ordered this visit:  Meds ordered this encounter  Medications   Bevacizumab (AVASTIN) SOLN 1.25 mg   Bevacizumab (AVASTIN) SOLN 1.25 mg     Return in about 4 weeks (around 08/12/2023) for f/u PDR OU, DFE, OCT.  There are no Patient Instructions on file for this visit.   Electronically signed by: Berlin Hun COT 12.12.245 12:32 AM  This document serves as a record of services personally performed by Karie Chimera, MD, PhD. It was created on their behalf by Glee Arvin. Manson Passey, OA an ophthalmic technician. The creation of this record is the provider's dictation and/or activities during the visit.    Electronically signed by: Glee Arvin. Manson Passey, OA 07/16/23 12:32  AM  Karie Chimera, M.D., Ph.D. Diseases & Surgery of the Retina and Vitreous [facility] 07/15/2023   I have reviewed the above documentation for accuracy and completeness, and I agree with the above. Karie Chimera, M.D., Ph.D. 07/16/23 12:35 AM   Abbreviations: M myopia (nearsighted); A astigmatism; H hyperopia (farsighted); P presbyopia; Mrx spectacle prescription;  CTL contact lenses; OD right eye; OS left eye; OU both eyes  XT exotropia; ET esotropia; PEK punctate epithelial keratitis; PEE punctate epithelial erosions; DES dry eye syndrome; MGD meibomian gland dysfunction; ATs artificial tears; PFAT's preservative free artificial tears; NSC nuclear sclerotic cataract; PSC posterior subcapsular cataract; ERM epi-retinal membrane; PVD posterior vitreous detachment; RD retinal detachment; DM diabetes mellitus; DR diabetic retinopathy; NPDR non-proliferative diabetic retinopathy; PDR proliferative diabetic retinopathy; CSME clinically significant macular edema; DME diabetic macular edema; dbh dot blot hemorrhages; CWS cotton wool spot; POAG primary open angle glaucoma; C/D cup-to-disc ratio; HVF humphrey visual field; GVF goldmann visual field; OCT optical coherence tomography; IOP intraocular pressure; BRVO Branch retinal vein occlusion; CRVO central retinal vein occlusion; CRAO central retinal artery occlusion; BRAO branch retinal artery occlusion; RT retinal tear; SB scleral buckle; PPV pars plana vitrectomy; VH Vitreous hemorrhage; PRP panretinal laser photocoagulation; IVK intravitreal kenalog; VMT vitreomacular traction; MH Macular hole;  NVD neovascularization of the disc; NVE neovascularization elsewhere; AREDS age related eye disease study; ARMD age related macular degeneration; POAG primary open angle glaucoma; EBMD epithelial/anterior basement membrane dystrophy; ACIOL anterior chamber intraocular lens; IOL intraocular lens; PCIOL posterior chamber intraocular lens;  Phaco/IOL phacoemulsification with intraocular lens placement; PRK photorefractive keratectomy; LASIK laser assisted in situ keratomileusis; HTN hypertension; DM diabetes mellitus; COPD chronic obstructive pulmonary disease

## 2023-07-15 ENCOUNTER — Encounter (INDEPENDENT_AMBULATORY_CARE_PROVIDER_SITE_OTHER): Payer: Self-pay | Admitting: Ophthalmology

## 2023-07-15 ENCOUNTER — Ambulatory Visit (INDEPENDENT_AMBULATORY_CARE_PROVIDER_SITE_OTHER): Payer: Medicare HMO | Admitting: Ophthalmology

## 2023-07-15 DIAGNOSIS — H35033 Hypertensive retinopathy, bilateral: Secondary | ICD-10-CM | POA: Diagnosis not present

## 2023-07-15 DIAGNOSIS — E113593 Type 2 diabetes mellitus with proliferative diabetic retinopathy without macular edema, bilateral: Secondary | ICD-10-CM

## 2023-07-15 DIAGNOSIS — E113599 Type 2 diabetes mellitus with proliferative diabetic retinopathy without macular edema, unspecified eye: Secondary | ICD-10-CM

## 2023-07-15 DIAGNOSIS — H4310 Vitreous hemorrhage, unspecified eye: Secondary | ICD-10-CM | POA: Diagnosis not present

## 2023-07-15 DIAGNOSIS — I10 Essential (primary) hypertension: Secondary | ICD-10-CM

## 2023-07-15 DIAGNOSIS — Z961 Presence of intraocular lens: Secondary | ICD-10-CM

## 2023-07-15 MED ORDER — BEVACIZUMAB CHEMO INJECTION 1.25MG/0.05ML SYRINGE FOR KALEIDOSCOPE
1.2500 mg | INTRAVITREAL | Status: AC | PRN
  Administered 2023-07-15: 1.25 mg via INTRAVITREAL

## 2023-07-22 ENCOUNTER — Other Ambulatory Visit: Payer: Self-pay | Admitting: Cardiology

## 2023-07-22 DIAGNOSIS — E7800 Pure hypercholesterolemia, unspecified: Secondary | ICD-10-CM

## 2023-07-22 DIAGNOSIS — I739 Peripheral vascular disease, unspecified: Secondary | ICD-10-CM

## 2023-08-12 ENCOUNTER — Encounter (INDEPENDENT_AMBULATORY_CARE_PROVIDER_SITE_OTHER): Payer: PPO | Admitting: Ophthalmology

## 2023-08-15 NOTE — Progress Notes (Signed)
 [facility] - Clinic Note  08/17/2023     CHIEF COMPLAINT Patient presents for Retina Follow Up   HISTORY OF PRESENT ILLNESS: Catherine Ramsey is a 78 y.o. female who presents to the clinic today for:   HPI     Retina Follow Up   Patient presents with  Diabetic Retinopathy.  In both eyes.  This started 4 weeks ago.  Duration of 4 weeks.  Since onset it is stable.  I, the attending physician,  performed the HPI with the patient and updated documentation appropriately.        Comments   4 week retina follow up PDR IVA OU pt is reporting no vision changes noticed she denies any flashes or floaters her last reading was 170 this am       Last edited by Ronelle Coffee, MD on 08/17/2023 12:43 PM.      Patient states she has not been seeing floaters in her vision, she's working on getting her blood sugar in a good range  Referring physician: Bertha Broad, MD [address] [place],  [place] [address]  HISTORICAL INFORMATION:   Selected notes from the MEDICAL RECORD NUMBER Referred by Dr. Rochelle Chu for [facility] OD LEE:  Ocular Hx- PMH-    CURRENT MEDICATIONS: No current outpatient medications on file. (Ophthalmic Drugs)   No current facility-administered medications for this visit. (Ophthalmic Drugs)   Current Outpatient Medications (Other)  Medication Sig   acetaminophen  (TYLENOL ) 500 MG tablet Take 1,000 mg by mouth every 6 (six) hours as needed for moderate pain.   Alcohol  Swabs (B-D SINGLE USE SWABS REGULAR) PADS    amLODipine  (NORVASC ) 10 MG tablet Take 10 mg by mouth every evening.   aspirin  81 MG chewable tablet Chew 81 mg by mouth daily.   Blood Glucose Monitoring Suppl (TRUE METRIX AIR GLUCOSE METER) w/Device KIT    cilostazol  (PLETAL ) 50 MG tablet Take 1 tablet (50 mg total) by mouth 2 (two) times daily.   CINNAMON PO Take 1 capsule by mouth daily. With chromium   empagliflozin (JARDIANCE) 10 MG TABS tablet Take 10 mg by mouth daily.   ezetimibe   (ZETIA ) 10 MG tablet Take 1 tablet (10 mg total) by mouth every evening.   gabapentin  (NEURONTIN ) 300 MG capsule Take 300 mg by mouth at bedtime.   ibuprofen  (ADVIL ) 200 MG tablet Take 400 mg by mouth daily as needed for moderate pain.   insulin  aspart (NOVOLOG ) 100 UNIT/ML FlexPen Inject 9 Units into the skin 3 (three) times daily as needed for high blood sugar.   Insulin  Syringe-Needle U-100 31G X 5/16" 0.5 ML MISC Use one syringe twice daily DX:  250.70   KLOR-CON  M20 20 MEQ tablet Take 20 mEq by mouth daily.   LANTUS  SOLOSTAR 100 UNIT/ML Solostar Pen Inject 17 Units into the skin daily.   Liniments (BLUE-EMU SUPER STRENGTH) CREA Apply 1 application. topically 3 (three) times daily.   losartan -hydrochlorothiazide  (HYZAAR) 50-12.5 MG tablet Take 1 tablet by mouth every morning.   omeprazole (PRILOSEC) 20 MG capsule Take 20 mg by mouth daily as needed (acid reflux).   rosuvastatin  (CRESTOR ) 20 MG tablet TAKE 1 TABLET EVERY DAY (DISCONTINUE PRAVASTATIN )   terbinafine (LAMISIL) 250 MG tablet 1 tablet Orally Once a day for 90 days for toenail fungus   TRUE METRIX BLOOD GLUCOSE TEST test strip    TRUEplus Lancets 33G MISC    No current facility-administered medications for this visit. (Other)   REVIEW OF  SYSTEMS: ROS   Positive for: Endocrine, Eyes Negative for: Constitutional, Gastrointestinal, Neurological, Skin, Genitourinary, Musculoskeletal, HENT, Cardiovascular, Respiratory, Psychiatric, Allergic/Imm, Heme/Lymph Last edited by Alise Appl, COT on 08/17/2023  8:55 AM.     ALLERGIES Allergies  Allergen Reactions   Lovastatin Rash   PAST MEDICAL HISTORY Past Medical History:  Diagnosis Date   Achalasia - Type II 05/08/2010   Qualifier: Diagnosis of  By: Willy Harvest MD, Kelleen Patee E    Acute kidney injury (HCC) 02/26/2019   Candida esophagitis (HCC) 03/26/2014   Cataract    COVID-19 virus detected 02/25/2019   Diabetes mellitus    type 2   Family history of adverse  reaction to anesthesia    son hard to wake up   GERD (gastroesophageal reflux disease)    Hyperlipidemia    Hypertension    Primary hypertension 02/25/2019   Syncope 02/25/2019   Trigger ring finger of right hand 10/07/2016   Past Surgical History:  Procedure Laterality Date   ABDOMINAL HYSTERECTOMY     BREAST BIOPSY     left/benign   COLONOSCOPY  01/31/2014   ESOPHAGEAL MANOMETRY N/A 04/01/2014   Procedure: ESOPHAGEAL MANOMETRY (EM);  Surgeon: Kenney Peacemaker, MD;  Location: [facility];  Service: Endoscopy;  Laterality: N/A;   HELLER MYOTOMY N/A 12/07/2016   Procedure: LAPAROSCOPIC HELLER MYOTOMY, DOR FUNDOPLICATION, HIATAL HERNIA REPAIR, INTRAOPERATIVE UPPER ENDOSCOPY;  Surgeon: Adalberto Hollow, MD;  Location: [facility];  Service: General;  Laterality: N/A;   IR ANGIOGRAM PELVIS SELECTIVE OR SUPRASELECTIVE  10/20/2021   IR RADIOLOGIST EVAL & MGMT  10/01/2021   IR RADIOLOGIST EVAL & MGMT  11/25/2021   IR RADIOLOGIST EVAL & MGMT  11/04/2022   IR TIB-PERO ART ATHEREC INC PTA MOD SED  10/15/2021   IR US  GUIDE VASC ACCESS RIGHT  10/15/2021   TOTAL HIP ARTHROPLASTY     right pin then replacement   UPPER GASTROINTESTINAL ENDOSCOPY     FAMILY HISTORY Family History  Problem Relation Age of Onset   Diabetes Mother        died at 48 from Covid-19   Hypertension Mother    Hyperlipidemia Mother    Breast cancer Mother    Diabetes Father    Hypertension Father    Hypertension Sister    Hypertension Sister    Colon polyps Daughter    Colon cancer Neg Hx    Esophageal cancer Neg Hx    Rectal cancer Neg Hx    Stomach cancer Neg Hx    SOCIAL HISTORY Social History   Tobacco Use   Smoking status: Never   Smokeless tobacco: Never  Vaping Use   Vaping status: Never Used  Substance Use Topics   Alcohol  use: No   Drug use: No       OPHTHALMIC EXAM:  Base Eye Exam     Visual Acuity (Snellen - Linear)       Right Left   Dist [place] 20/30 -2 20/20 -1   Dist ph [place] 20/25 -2           Tonometry (Tonopen, 9:02 AM)       Right Left   Pressure 14 16         Pupils       Pupils Dark Light Shape React APD   Right PERRL 4 3 Round Brisk None   Left PERRL 3 3 Round NR None         Extraocular Movement       Right  Left    Full, Ortho Full, Ortho         Neuro/Psych     Oriented x3: Yes   Mood/Affect: Normal         Dilation     Both eyes: 2.5% Phenylephrine  @ 9:02 AM           Slit Lamp and Fundus Exam     External Exam       Right Left   External Normal Normal         Slit Lamp Exam       Right Left   Lids/Lashes Dermatochalasis - upper lid Dermatochalasis - upper lid   Conjunctiva/Sclera Melanosis, nasal and temporal pinguecula Melanosis, nasal and temporal pinguecula   Cornea arcus, well healed cataract wound, tear film debris, 2+ inferior Punctate epithelial erosions arcus, well healed cataract wound, trace PEE, tear film debris, fine endo pigment inferiorly   Anterior Chamber Deep and Clear Deep and Clear   Iris Round and dilated, No NVI Round and dilated, No NVI   Lens PC IOL in good position, pigment/RBC on post optic PC IOL in good position,    Anterior Vitreous Syneresis, +RBC's anterior vit -- improved, Posterior vitreous detachment, blood stained vitreous condensation greatest inferiorly, focal tractional fibrosis superior equator Syneresis, blood stained vitreous condensations -- improved         Fundus Exam       Right Left   Disc 2+ pallor, sharp rim, mild PPP, Compact Mild pallor, sharp rim, Mild PPP   C/D Ratio 0.2 0.3   Macula Flat, good foveal reflex, No heme or edema Good foveal reflex, mild ERM, scattered MA, no edema   Vessels Attenuated, tortuous, severe attenuation IN arcades Attenuated, mild tortuosity   Periphery Attached, scatterd MA/DBH, focal fibrosis at 1130, 360 PRP room for fil in Attached, scattered MA/DBH, good 360 PRP changes, focal fibrosis nasal midzone, linear pre-retinal heme inferior  midzone           IMAGING AND PROCEDURES  Imaging and Procedures for 08/17/2023  OCT, Retina - OU - Both Eyes       Right Eye Quality was borderline. Central Foveal Thickness: 229. Progression has improved. Findings include normal foveal contour, no IRF, no SRF (persistent vitreous opacities -- slightly improved).   Left Eye Quality was borderline. Central Foveal Thickness: 230. Progression has improved. Findings include normal foveal contour, no IRF, no SRF, preretinal fibrosis (Partial PVD, PRF over disc, interval improvement in vitreous opacities).   Notes *Images captured and stored on drive  Diagnosis / Impression:  NFP, no IRF/SRF OU OD: persistent vitreous opacities -- slightly improved OS: Partial PVD, PRF over disc, interval improvement in vitreous opacities  Clinical management:  See below  Abbreviations: NFP - Normal foveal profile. CME - cystoid macular edema. PED - pigment epithelial detachment. IRF - intraretinal fluid. SRF - subretinal fluid. EZ - ellipsoid zone. ERM - epiretinal membrane. ORA - outer retinal atrophy. ORT - outer retinal tubulation. SRHM - subretinal hyper-reflective material. IRHM - intraretinal hyper-reflective material            ASSESSMENT/PLAN:    ICD-10-CM   1. Proliferative diabetic retinopathy of both eyes without macular edema associated with type 2 diabetes mellitus (HCC)  E11.3593 OCT, Retina - OU - Both Eyes    CANCELED: Intravitreal Injection, Pharmacologic Agent - OD - Right Eye    CANCELED: Intravitreal Injection, Pharmacologic Agent - OS - Left Eye    2. Vitreous hemorrhage due to  proliferative diabetic retinopathy (HCC)  E11.3599    H43.10     3. Essential hypertension  I10     4. Hypertensive retinopathy of both eyes  H35.033     5. Pseudophakia, both eyes  Z96.1      1,2. Proliferative diabetic retinopathy OU, w/ h/o VH OD  - pt lost to f/u from 03.22.24 to 11.15.24 (~8 mos) -- presents acutely for new floaters  and dec VA OD  - pt lost to f/u from 01.08.24 to 03.22.24 (~2.5 mos) - pt presents acutely for new floaters and blurred vision OS -- onset Monday, 03.18.24 - FA (11.28.23) shows OD: Multiple patches NVE nasal hemisphere. Scattered blockage from diffuse VH. OS: Scattered patches of NVE greatest nasal hemisphere. Large area of vascular non profusion, nasal periphery. **pt will need PRP OU** - s/p IVA OD #1 (11.28.23), #2 (12.28.23), #3 (11.14.24), #4 (12.13.24) - s/p IVA OS #1 (03.22.24), #2 (12.13.24) - s/p PRP OS (12.12.23)  - s/p PRP OD (01.08.24) - BCVA 20/25 OU -- improved - OCT shows OD: interval improvement in vitreous opacities; OS: Partial PVD, PRF over disc, mild interval increase in vitreous opacities - discussed findings and prognosis - recommend IVA OU -- maintenance -- pt wishes to defer injections today - discussed risk of holding therapy including decreased vision, hemorrhage -- pt verbalized understanding - informed consent obtained and signed for Avastin , 12.13.24 (OU) - VH precautions reviewed -- minimize activities, keep head elevated, avoid ASA/NSAIDs/blood thinners as able  - f/u 4 weeks, DFE, OCT   3,4. Hypertensive retinopathy OU - discussed importance of tight BP control - monitor  5. Pseudophakia OU  - s/p CE/IOL OU (Dr. Paulene Boron)  - IOLs in good position, doing well  - monitor  Ophthalmic Meds Ordered this visit:  No orders of the defined types were placed in this encounter.    Return in about 4 weeks (around 09/14/2023) for f/u PDR OU, DFE, OCT.  There are no Patient Instructions on file for this visit.   This document serves as a record of services personally performed by Jeanice Millard, MD, PhD. It was created on their behalf by Morley Arabia. Bevin Bucks, OA an ophthalmic technician. The creation of this record is the provider's dictation and/or activities during the visit.    Electronically signed by: Morley Arabia. Bevin Bucks, OA 08/20/23 1:29 PM  Jeanice Millard, M.D.,  Ph.D. Diseases & Surgery of the Retina and Vitreous [facility] 08/17/2023   I have reviewed the above documentation for accuracy and completeness, and I agree with the above. Jeanice Millard, M.D., Ph.D. 08/20/23 1:34 PM   Abbreviations: M myopia (nearsighted); A astigmatism; H hyperopia (farsighted); P presbyopia; Mrx spectacle prescription;  CTL contact lenses; OD right eye; OS left eye; OU both eyes  XT exotropia; ET esotropia; PEK punctate epithelial keratitis; PEE punctate epithelial erosions; DES dry eye syndrome; MGD meibomian gland dysfunction; ATs artificial tears; PFAT's preservative free artificial tears; NSC nuclear sclerotic cataract; PSC posterior subcapsular cataract; ERM epi-retinal membrane; PVD posterior vitreous detachment; RD retinal detachment; DM diabetes mellitus; DR diabetic retinopathy; NPDR non-proliferative diabetic retinopathy; PDR proliferative diabetic retinopathy; CSME clinically significant macular edema; DME diabetic macular edema; dbh dot blot hemorrhages; CWS cotton wool spot; POAG primary open angle glaucoma; C/D cup-to-disc ratio; HVF humphrey visual field; GVF goldmann visual field; OCT optical coherence tomography; IOP intraocular pressure; BRVO Branch retinal vein occlusion; CRVO central retinal vein occlusion; CRAO central retinal artery occlusion; BRAO branch retinal  artery occlusion; RT retinal tear; SB scleral buckle; PPV pars plana vitrectomy; VH Vitreous hemorrhage; PRP panretinal laser photocoagulation; IVK intravitreal kenalog; VMT vitreomacular traction; MH Macular hole;  NVD neovascularization of the disc; NVE neovascularization elsewhere; AREDS age related eye disease study; ARMD age related macular degeneration; POAG primary open angle glaucoma; EBMD epithelial/anterior basement membrane dystrophy; ACIOL anterior chamber intraocular lens; IOL intraocular lens; PCIOL posterior chamber intraocular lens; Phaco/IOL phacoemulsification  with intraocular lens placement; PRK photorefractive keratectomy; LASIK laser assisted in situ keratomileusis; HTN hypertension; DM diabetes mellitus; COPD chronic obstructive pulmonary disease

## 2023-08-17 ENCOUNTER — Ambulatory Visit (INDEPENDENT_AMBULATORY_CARE_PROVIDER_SITE_OTHER): Payer: PPO | Admitting: Ophthalmology

## 2023-08-17 ENCOUNTER — Encounter (INDEPENDENT_AMBULATORY_CARE_PROVIDER_SITE_OTHER): Payer: Self-pay | Admitting: Ophthalmology

## 2023-08-17 DIAGNOSIS — I10 Essential (primary) hypertension: Secondary | ICD-10-CM | POA: Diagnosis not present

## 2023-08-17 DIAGNOSIS — H4310 Vitreous hemorrhage, unspecified eye: Secondary | ICD-10-CM

## 2023-08-17 DIAGNOSIS — H35033 Hypertensive retinopathy, bilateral: Secondary | ICD-10-CM

## 2023-08-17 DIAGNOSIS — E113593 Type 2 diabetes mellitus with proliferative diabetic retinopathy without macular edema, bilateral: Secondary | ICD-10-CM

## 2023-08-17 DIAGNOSIS — Z961 Presence of intraocular lens: Secondary | ICD-10-CM

## 2023-08-23 DIAGNOSIS — K5900 Constipation, unspecified: Secondary | ICD-10-CM | POA: Diagnosis not present

## 2023-08-23 DIAGNOSIS — E1151 Type 2 diabetes mellitus with diabetic peripheral angiopathy without gangrene: Secondary | ICD-10-CM | POA: Diagnosis not present

## 2023-08-23 DIAGNOSIS — H6122 Impacted cerumen, left ear: Secondary | ICD-10-CM | POA: Diagnosis not present

## 2023-08-23 DIAGNOSIS — R3129 Other microscopic hematuria: Secondary | ICD-10-CM | POA: Diagnosis not present

## 2023-08-23 DIAGNOSIS — N1831 Chronic kidney disease, stage 3a: Secondary | ICD-10-CM | POA: Diagnosis not present

## 2023-08-23 DIAGNOSIS — R1032 Left lower quadrant pain: Secondary | ICD-10-CM | POA: Diagnosis not present

## 2023-08-23 DIAGNOSIS — H9193 Unspecified hearing loss, bilateral: Secondary | ICD-10-CM | POA: Diagnosis not present

## 2023-09-14 ENCOUNTER — Encounter (INDEPENDENT_AMBULATORY_CARE_PROVIDER_SITE_OTHER): Payer: PPO | Admitting: Ophthalmology

## 2023-09-14 DIAGNOSIS — H35033 Hypertensive retinopathy, bilateral: Secondary | ICD-10-CM

## 2023-09-14 DIAGNOSIS — I10 Essential (primary) hypertension: Secondary | ICD-10-CM

## 2023-09-14 DIAGNOSIS — H4310 Vitreous hemorrhage, unspecified eye: Secondary | ICD-10-CM

## 2023-09-14 DIAGNOSIS — Z961 Presence of intraocular lens: Secondary | ICD-10-CM

## 2023-09-14 DIAGNOSIS — E113593 Type 2 diabetes mellitus with proliferative diabetic retinopathy without macular edema, bilateral: Secondary | ICD-10-CM

## 2023-09-15 NOTE — Progress Notes (Shared)
[facility] - Clinic Note  09/20/2023     CHIEF COMPLAINT Patient presents for No chief complaint on file.   HISTORY OF PRESENT ILLNESS: Catherine Ramsey is a 78 y.o. female who presents to the clinic today for:      Patient states she has not been seeing floaters in her vision, she's working on getting her blood sugar in a good range  Referring physician: Garlan Fillers, MD [address] [place],  [place] [address]  HISTORICAL INFORMATION:   Selected notes from the MEDICAL RECORD NUMBER Referred by Dr. Yetta Barre for [facility] OD LEE:  Ocular Hx- PMH-    CURRENT MEDICATIONS: No current outpatient medications on file. (Ophthalmic Drugs)   No current facility-administered medications for this visit. (Ophthalmic Drugs)   Current Outpatient Medications (Other)  Medication Sig   acetaminophen (TYLENOL) 500 MG tablet Take 1,000 mg by mouth every 6 (six) hours as needed for moderate pain.   Alcohol Swabs (B-D SINGLE USE SWABS REGULAR) PADS    amLODipine (NORVASC) 10 MG tablet Take 10 mg by mouth every evening.   aspirin 81 MG chewable tablet Chew 81 mg by mouth daily.   Blood Glucose Monitoring Suppl (TRUE METRIX AIR GLUCOSE METER) w/Device KIT    cilostazol (PLETAL) 50 MG tablet Take 1 tablet (50 mg total) by mouth 2 (two) times daily.   CINNAMON PO Take 1 capsule by mouth daily. With chromium   empagliflozin (JARDIANCE) 10 MG TABS tablet Take 10 mg by mouth daily.   ezetimibe (ZETIA) 10 MG tablet Take 1 tablet (10 mg total) by mouth every evening.   gabapentin (NEURONTIN) 300 MG capsule Take 300 mg by mouth at bedtime.   ibuprofen (ADVIL) 200 MG tablet Take 400 mg by mouth daily as needed for moderate pain.   insulin aspart (NOVOLOG) 100 UNIT/ML FlexPen Inject 9 Units into the skin 3 (three) times daily as needed for high blood sugar.   Insulin Syringe-Needle U-100 31G X 5/16" 0.5 ML MISC Use one syringe twice daily DX:  250.70   KLOR-CON M20 20 MEQ tablet Take  20 mEq by mouth daily.   LANTUS SOLOSTAR 100 UNIT/ML Solostar Pen Inject 17 Units into the skin daily.   Liniments (BLUE-EMU SUPER STRENGTH) CREA Apply 1 application. topically 3 (three) times daily.   losartan-hydrochlorothiazide (HYZAAR) 50-12.5 MG tablet Take 1 tablet by mouth every morning.   omeprazole (PRILOSEC) 20 MG capsule Take 20 mg by mouth daily as needed (acid reflux).   rosuvastatin (CRESTOR) 20 MG tablet TAKE 1 TABLET EVERY DAY (DISCONTINUE PRAVASTATIN)   terbinafine (LAMISIL) 250 MG tablet 1 tablet Orally Once a day for 90 days for toenail fungus   TRUE METRIX BLOOD GLUCOSE TEST test strip    TRUEplus Lancets 33G MISC    No current facility-administered medications for this visit. (Other)   REVIEW OF SYSTEMS:   ALLERGIES Allergies  Allergen Reactions   Lovastatin Rash   PAST MEDICAL HISTORY Past Medical History:  Diagnosis Date   Achalasia - Type II 05/08/2010   Qualifier: Diagnosis of  By: Leone Payor MD, Alfonse Ras E    Acute kidney injury (HCC) 02/26/2019   Candida esophagitis (HCC) 03/26/2014   Cataract    COVID-19 virus detected 02/25/2019   Diabetes mellitus    type 2   Family history of adverse reaction to anesthesia    son hard to wake up   GERD (gastroesophageal reflux disease)    Hyperlipidemia    Hypertension  Primary hypertension 02/25/2019   Syncope 02/25/2019   Trigger ring finger of right hand 10/07/2016   Past Surgical History:  Procedure Laterality Date   ABDOMINAL HYSTERECTOMY     BREAST BIOPSY     left/benign   COLONOSCOPY  01/31/2014   ESOPHAGEAL MANOMETRY N/A 04/01/2014   Procedure: ESOPHAGEAL MANOMETRY (EM);  Surgeon: Iva Boop, MD;  Location: [facility];  Service: Endoscopy;  Laterality: N/A;   HELLER MYOTOMY N/A 12/07/2016   Procedure: LAPAROSCOPIC HELLER MYOTOMY, DOR FUNDOPLICATION, HIATAL HERNIA REPAIR, INTRAOPERATIVE UPPER ENDOSCOPY;  Surgeon: Avel Peace, MD;  Location: [facility];  Service: General;  Laterality: N/A;    IR ANGIOGRAM PELVIS SELECTIVE OR SUPRASELECTIVE  10/20/2021   IR RADIOLOGIST EVAL & MGMT  10/01/2021   IR RADIOLOGIST EVAL & MGMT  11/25/2021   IR RADIOLOGIST EVAL & MGMT  11/04/2022   IR TIB-PERO ART ATHEREC INC PTA MOD SED  10/15/2021   IR US GUIDE VASC ACCESS RIGHT  10/15/2021   TOTAL HIP ARTHROPLASTY     right pin then replacement   UPPER GASTROINTESTINAL ENDOSCOPY     FAMILY HISTORY Family History  Problem Relation Age of Onset   Diabetes Mother        died at 61 from Covid-19   Hypertension Mother    Hyperlipidemia Mother    Breast cancer Mother    Diabetes Father    Hypertension Father    Hypertension Sister    Hypertension Sister    Colon polyps Daughter    Colon cancer Neg Hx    Esophageal cancer Neg Hx    Rectal cancer Neg Hx    Stomach cancer Neg Hx    SOCIAL HISTORY Social History   Tobacco Use   Smoking status: Never   Smokeless tobacco: Never  Vaping Use   Vaping status: Never Used  Substance Use Topics   Alcohol use: No   Drug use: No       OPHTHALMIC EXAM:  Not recorded    IMAGING AND PROCEDURES  Imaging and Procedures for 09/20/2023          ASSESSMENT/PLAN:  No diagnosis found.  1,2. Proliferative diabetic retinopathy OU, w/ h/o VH OD  - pt lost to f/u from 03.22.24 to 11.15.24 (~8 mos) -- presents acutely for new floaters and dec VA OD  - pt lost to f/u from 01.08.24 to 03.22.24 (~2.5 mos) - pt presents acutely for new floaters and blurred vision OS -- onset Monday, 03.18.24 - FA (11.28.23) shows OD: Multiple patches NVE nasal hemisphere. Scattered blockage from diffuse VH. OS: Scattered patches of NVE greatest nasal hemisphere. Large area of vascular non profusion, nasal periphery. **pt will need PRP OU** - s/p IVA OD #1 (11.28.23), #2 (12.28.23), #3 (11.14.24), #4 (12.13.24) - s/p IVA OS #1 (03.22.24), #2 (12.13.24) - s/p PRP OS (12.12.23)  - s/p PRP OD (01.08.24) - BCVA 20/25 OU -- improved - OCT shows OD: interval improvement  in vitreous opacities; OS: Partial PVD, PRF over disc, mild interval increase in vitreous opacities - discussed findings and prognosis - recommend IVA OU (OD #5 and OS #3) today, 02.18.25 - discussed risk of holding therapy including decreased vision, hemorrhage -- pt verbalized understanding - informed consent obtained and signed for Avastin, 12.13.24 (OU) - VH precautions reviewed -- minimize activities, keep head elevated, avoid ASA/NSAIDs/blood thinners as able  - f/u 4 weeks, DFE, OCT   3,4. Hypertensive retinopathy OU - discussed importance of tight BP control - monitor  5. Pseudophakia  OU  - s/p CE/IOL OU (Dr. Wynelle Link)  - IOLs in good position, doing well  - monitor  Ophthalmic Meds Ordered this visit:  No orders of the defined types were placed in this encounter.    No follow-ups on file.  There are no Patient Instructions on file for this visit.  This document serves as a record of services personally performed by Karie Chimera, MD, PhD. It was created on their behalf by Charlette Caffey, COT an ophthalmic technician. The creation of this record is the provider's dictation and/or activities during the visit.    Electronically signed by:  Charlette Caffey, COT  09/15/23 10:21 AM    Karie Chimera, M.D., Ph.D. Diseases & Surgery of the Retina and Vitreous [facility] 09/12/2023     Abbreviations: M myopia (nearsighted); A astigmatism; H hyperopia (farsighted); P presbyopia; Mrx spectacle prescription;  CTL contact lenses; OD right eye; OS left eye; OU both eyes  XT exotropia; ET esotropia; PEK punctate epithelial keratitis; PEE punctate epithelial erosions; DES dry eye syndrome; MGD meibomian gland dysfunction; ATs artificial tears; PFAT's preservative free artificial tears; NSC nuclear sclerotic cataract; PSC posterior subcapsular cataract; ERM epi-retinal membrane; PVD posterior vitreous detachment; RD retinal detachment; DM diabetes  mellitus; DR diabetic retinopathy; NPDR non-proliferative diabetic retinopathy; PDR proliferative diabetic retinopathy; CSME clinically significant macular edema; DME diabetic macular edema; dbh dot blot hemorrhages; CWS cotton wool spot; POAG primary open angle glaucoma; C/D cup-to-disc ratio; HVF humphrey visual field; GVF goldmann visual field; OCT optical coherence tomography; IOP intraocular pressure; BRVO Branch retinal vein occlusion; CRVO central retinal vein occlusion; CRAO central retinal artery occlusion; BRAO branch retinal artery occlusion; RT retinal tear; SB scleral buckle; PPV pars plana vitrectomy; VH Vitreous hemorrhage; PRP panretinal laser photocoagulation; IVK intravitreal kenalog; VMT vitreomacular traction; MH Macular hole;  NVD neovascularization of the disc; NVE neovascularization elsewhere; AREDS age related eye disease study; ARMD age related macular degeneration; POAG primary open angle glaucoma; EBMD epithelial/anterior basement membrane dystrophy; ACIOL anterior chamber intraocular lens; IOL intraocular lens; PCIOL posterior chamber intraocular lens; Phaco/IOL phacoemulsification with intraocular lens placement; PRK photorefractive keratectomy; LASIK laser assisted in situ keratomileusis; HTN hypertension; DM diabetes mellitus; COPD chronic obstructive pulmonary disease

## 2023-09-20 ENCOUNTER — Encounter (INDEPENDENT_AMBULATORY_CARE_PROVIDER_SITE_OTHER): Payer: PPO | Admitting: Ophthalmology

## 2023-09-20 DIAGNOSIS — Z961 Presence of intraocular lens: Secondary | ICD-10-CM

## 2023-09-20 DIAGNOSIS — I10 Essential (primary) hypertension: Secondary | ICD-10-CM

## 2023-09-20 DIAGNOSIS — E113593 Type 2 diabetes mellitus with proliferative diabetic retinopathy without macular edema, bilateral: Secondary | ICD-10-CM

## 2023-09-20 DIAGNOSIS — H35033 Hypertensive retinopathy, bilateral: Secondary | ICD-10-CM

## 2023-09-20 DIAGNOSIS — E113599 Type 2 diabetes mellitus with proliferative diabetic retinopathy without macular edema, unspecified eye: Secondary | ICD-10-CM

## 2023-09-26 DIAGNOSIS — D8481 Immunodeficiency due to conditions classified elsewhere: Secondary | ICD-10-CM | POA: Diagnosis not present

## 2023-09-26 DIAGNOSIS — Z794 Long term (current) use of insulin: Secondary | ICD-10-CM | POA: Diagnosis not present

## 2023-09-26 DIAGNOSIS — E1151 Type 2 diabetes mellitus with diabetic peripheral angiopathy without gangrene: Secondary | ICD-10-CM | POA: Diagnosis not present

## 2023-09-26 DIAGNOSIS — E1169 Type 2 diabetes mellitus with other specified complication: Secondary | ICD-10-CM | POA: Diagnosis not present

## 2023-09-26 DIAGNOSIS — E876 Hypokalemia: Secondary | ICD-10-CM | POA: Diagnosis not present

## 2023-09-26 DIAGNOSIS — E785 Hyperlipidemia, unspecified: Secondary | ICD-10-CM | POA: Diagnosis not present

## 2023-09-26 DIAGNOSIS — K219 Gastro-esophageal reflux disease without esophagitis: Secondary | ICD-10-CM | POA: Diagnosis not present

## 2023-09-26 DIAGNOSIS — I10 Essential (primary) hypertension: Secondary | ICD-10-CM | POA: Diagnosis not present

## 2023-09-26 DIAGNOSIS — E663 Overweight: Secondary | ICD-10-CM | POA: Diagnosis not present

## 2023-09-26 DIAGNOSIS — E1142 Type 2 diabetes mellitus with diabetic polyneuropathy: Secondary | ICD-10-CM | POA: Diagnosis not present

## 2023-09-26 DIAGNOSIS — E1165 Type 2 diabetes mellitus with hyperglycemia: Secondary | ICD-10-CM | POA: Diagnosis not present

## 2023-09-26 DIAGNOSIS — G8929 Other chronic pain: Secondary | ICD-10-CM | POA: Diagnosis not present

## 2023-10-05 DIAGNOSIS — R319 Hematuria, unspecified: Secondary | ICD-10-CM | POA: Diagnosis not present

## 2023-10-05 DIAGNOSIS — E785 Hyperlipidemia, unspecified: Secondary | ICD-10-CM | POA: Diagnosis not present

## 2023-10-05 DIAGNOSIS — I129 Hypertensive chronic kidney disease with stage 1 through stage 4 chronic kidney disease, or unspecified chronic kidney disease: Secondary | ICD-10-CM | POA: Diagnosis not present

## 2023-10-05 DIAGNOSIS — N1831 Chronic kidney disease, stage 3a: Secondary | ICD-10-CM | POA: Diagnosis not present

## 2023-10-05 DIAGNOSIS — Z794 Long term (current) use of insulin: Secondary | ICD-10-CM | POA: Diagnosis not present

## 2023-10-05 DIAGNOSIS — E1151 Type 2 diabetes mellitus with diabetic peripheral angiopathy without gangrene: Secondary | ICD-10-CM | POA: Diagnosis not present

## 2023-10-11 DIAGNOSIS — Z1212 Encounter for screening for malignant neoplasm of rectum: Secondary | ICD-10-CM | POA: Diagnosis not present

## 2023-10-11 DIAGNOSIS — E785 Hyperlipidemia, unspecified: Secondary | ICD-10-CM | POA: Diagnosis not present

## 2023-10-12 DIAGNOSIS — E1151 Type 2 diabetes mellitus with diabetic peripheral angiopathy without gangrene: Secondary | ICD-10-CM | POA: Diagnosis not present

## 2023-10-12 DIAGNOSIS — N1831 Chronic kidney disease, stage 3a: Secondary | ICD-10-CM | POA: Diagnosis not present

## 2023-10-12 DIAGNOSIS — I129 Hypertensive chronic kidney disease with stage 1 through stage 4 chronic kidney disease, or unspecified chronic kidney disease: Secondary | ICD-10-CM | POA: Diagnosis not present

## 2023-10-12 DIAGNOSIS — E785 Hyperlipidemia, unspecified: Secondary | ICD-10-CM | POA: Diagnosis not present

## 2023-10-18 DIAGNOSIS — Z794 Long term (current) use of insulin: Secondary | ICD-10-CM | POA: Diagnosis not present

## 2023-10-18 DIAGNOSIS — N1831 Chronic kidney disease, stage 3a: Secondary | ICD-10-CM | POA: Diagnosis not present

## 2023-10-18 DIAGNOSIS — Z1339 Encounter for screening examination for other mental health and behavioral disorders: Secondary | ICD-10-CM | POA: Diagnosis not present

## 2023-10-18 DIAGNOSIS — I129 Hypertensive chronic kidney disease with stage 1 through stage 4 chronic kidney disease, or unspecified chronic kidney disease: Secondary | ICD-10-CM | POA: Diagnosis not present

## 2023-10-18 DIAGNOSIS — E785 Hyperlipidemia, unspecified: Secondary | ICD-10-CM | POA: Diagnosis not present

## 2023-10-18 DIAGNOSIS — M6530 Trigger finger, unspecified finger: Secondary | ICD-10-CM | POA: Diagnosis not present

## 2023-10-18 DIAGNOSIS — I739 Peripheral vascular disease, unspecified: Secondary | ICD-10-CM | POA: Diagnosis not present

## 2023-10-18 DIAGNOSIS — F5104 Psychophysiologic insomnia: Secondary | ICD-10-CM | POA: Diagnosis not present

## 2023-10-18 DIAGNOSIS — Z1331 Encounter for screening for depression: Secondary | ICD-10-CM | POA: Diagnosis not present

## 2023-10-18 DIAGNOSIS — E1151 Type 2 diabetes mellitus with diabetic peripheral angiopathy without gangrene: Secondary | ICD-10-CM | POA: Diagnosis not present

## 2023-10-18 DIAGNOSIS — Z0000 Encounter for general adult medical examination without abnormal findings: Secondary | ICD-10-CM | POA: Diagnosis not present

## 2023-10-18 DIAGNOSIS — M48062 Spinal stenosis, lumbar region with neurogenic claudication: Secondary | ICD-10-CM | POA: Diagnosis not present

## 2023-11-11 ENCOUNTER — Ambulatory Visit (INDEPENDENT_AMBULATORY_CARE_PROVIDER_SITE_OTHER): Admitting: Podiatry

## 2023-11-11 ENCOUNTER — Encounter: Payer: Self-pay | Admitting: Podiatry

## 2023-11-11 DIAGNOSIS — M79675 Pain in left toe(s): Secondary | ICD-10-CM

## 2023-11-11 DIAGNOSIS — I999 Unspecified disorder of circulatory system: Secondary | ICD-10-CM | POA: Diagnosis not present

## 2023-11-11 DIAGNOSIS — B351 Tinea unguium: Secondary | ICD-10-CM

## 2023-11-11 DIAGNOSIS — E1142 Type 2 diabetes mellitus with diabetic polyneuropathy: Secondary | ICD-10-CM

## 2023-11-11 DIAGNOSIS — M79674 Pain in right toe(s): Secondary | ICD-10-CM | POA: Diagnosis not present

## 2023-11-11 NOTE — Progress Notes (Signed)
 This patient returns to my office for at risk foot care.  This patient requires this care by a professional since this patient will be at risk due to having diabetes and PAD and vascular disease. She says she is taking new medicine which has helped reduce her foot pain.  This patient is unable to cut nails herself since the patient cannot reach her nails.These nails are painful walking and wearing shoes.  This patient presents for at risk foot care today.  General Appearance  Alert, conversant and in no acute stress.  Vascular  Dorsalis pedis and posterior tibial  pulses are  not palpable  bilaterally.  Capillary return is within normal limits  bilaterally. Cold feet.  bilaterally.  Absent hair.    Neurologic  Senn-Weinstein monofilament wire test within normal limits  bilaterally. Muscle power within normal limits bilaterally.  Nails Thick disfigured discolored nails with subungual debris  from hallux to fifth toes bilaterally. No evidence of bacterial infection or drainage bilaterally.   Orthopedic  No limitations of motion  feet .  No crepitus or effusions noted.  No bony pathology or digital deformities noted.  Skin  normotropic skin with no porokeratosis noted bilaterally.  No signs of infections or ulcers noted.  Healing skin ulcer third toe left foot.   Onychomycosis  Pain in right toes  Pain in left toes  Consent was obtained for treatment procedures.   Mechanical debridement of nails 1-5  bilaterally performed with a nail nipper.  Filed with dremel without incident.    Return office visit   12 weeks                    Told patient to return for periodic foot care and evaluation due to potential at risk complications.   Helane Gunther DPM

## 2023-11-14 ENCOUNTER — Other Ambulatory Visit (HOSPITAL_COMMUNITY): Payer: Self-pay | Admitting: Adult Health

## 2023-11-14 DIAGNOSIS — R231 Pallor: Secondary | ICD-10-CM | POA: Diagnosis not present

## 2023-11-14 DIAGNOSIS — M79604 Pain in right leg: Secondary | ICD-10-CM | POA: Diagnosis not present

## 2023-11-14 DIAGNOSIS — I739 Peripheral vascular disease, unspecified: Secondary | ICD-10-CM

## 2023-11-15 ENCOUNTER — Ambulatory Visit (HOSPITAL_COMMUNITY)
Admission: RE | Admit: 2023-11-15 | Discharge: 2023-11-15 | Discharge status: Home / Self Care | Source: Ambulatory Visit | Attending: Cardiology

## 2023-11-15 ENCOUNTER — Other Ambulatory Visit: Payer: Self-pay

## 2023-11-15 ENCOUNTER — Emergency Department (HOSPITAL_COMMUNITY)

## 2023-11-15 ENCOUNTER — Encounter (HOSPITAL_COMMUNITY): Payer: Self-pay

## 2023-11-15 ENCOUNTER — Other Ambulatory Visit (HOSPITAL_COMMUNITY): Payer: Self-pay | Admitting: Adult Health

## 2023-11-15 ENCOUNTER — Encounter (HOSPITAL_COMMUNITY): Payer: Self-pay | Admitting: Emergency Medicine

## 2023-11-15 ENCOUNTER — Inpatient Hospital Stay (HOSPITAL_COMMUNITY)
Admission: EM | Admit: 2023-11-15 | Discharge: 2023-11-28 | DRG: 279 | Disposition: A | Discharge status: Home Health | Attending: Internal Medicine | Admitting: Internal Medicine

## 2023-11-15 DIAGNOSIS — Z7984 Long term (current) use of oral hypoglycemic drugs: Secondary | ICD-10-CM | POA: Diagnosis not present

## 2023-11-15 DIAGNOSIS — E1151 Type 2 diabetes mellitus with diabetic peripheral angiopathy without gangrene: Secondary | ICD-10-CM | POA: Diagnosis not present

## 2023-11-15 DIAGNOSIS — Z7902 Long term (current) use of antithrombotics/antiplatelets: Secondary | ICD-10-CM

## 2023-11-15 DIAGNOSIS — R231 Pallor: Secondary | ICD-10-CM | POA: Insufficient documentation

## 2023-11-15 DIAGNOSIS — Z8616 Personal history of COVID-19: Secondary | ICD-10-CM

## 2023-11-15 DIAGNOSIS — I70221 Atherosclerosis of native arteries of extremities with rest pain, right leg: Secondary | ICD-10-CM | POA: Diagnosis not present

## 2023-11-15 DIAGNOSIS — R0989 Other specified symptoms and signs involving the circulatory and respiratory systems: Secondary | ICD-10-CM | POA: Diagnosis not present

## 2023-11-15 DIAGNOSIS — R509 Fever, unspecified: Secondary | ICD-10-CM | POA: Diagnosis not present

## 2023-11-15 DIAGNOSIS — N1832 Chronic kidney disease, stage 3b: Secondary | ICD-10-CM | POA: Diagnosis present

## 2023-11-15 DIAGNOSIS — I152 Hypertension secondary to endocrine disorders: Secondary | ICD-10-CM | POA: Diagnosis not present

## 2023-11-15 DIAGNOSIS — E1165 Type 2 diabetes mellitus with hyperglycemia: Secondary | ICD-10-CM | POA: Diagnosis not present

## 2023-11-15 DIAGNOSIS — E785 Hyperlipidemia, unspecified: Secondary | ICD-10-CM | POA: Diagnosis present

## 2023-11-15 DIAGNOSIS — I739 Peripheral vascular disease, unspecified: Secondary | ICD-10-CM

## 2023-11-15 DIAGNOSIS — E1169 Type 2 diabetes mellitus with other specified complication: Secondary | ICD-10-CM | POA: Diagnosis present

## 2023-11-15 DIAGNOSIS — Z79899 Other long term (current) drug therapy: Secondary | ICD-10-CM

## 2023-11-15 DIAGNOSIS — M79604 Pain in right leg: Secondary | ICD-10-CM

## 2023-11-15 DIAGNOSIS — I701 Atherosclerosis of renal artery: Secondary | ICD-10-CM | POA: Diagnosis not present

## 2023-11-15 DIAGNOSIS — D62 Acute posthemorrhagic anemia: Secondary | ICD-10-CM | POA: Diagnosis not present

## 2023-11-15 DIAGNOSIS — D649 Anemia, unspecified: Secondary | ICD-10-CM | POA: Diagnosis present

## 2023-11-15 DIAGNOSIS — D72829 Elevated white blood cell count, unspecified: Secondary | ICD-10-CM | POA: Diagnosis not present

## 2023-11-15 DIAGNOSIS — Z96641 Presence of right artificial hip joint: Secondary | ICD-10-CM | POA: Diagnosis not present

## 2023-11-15 DIAGNOSIS — I82439 Acute embolism and thrombosis of unspecified popliteal vein: Secondary | ICD-10-CM | POA: Diagnosis not present

## 2023-11-15 DIAGNOSIS — Z888 Allergy status to other drugs, medicaments and biological substances status: Secondary | ICD-10-CM | POA: Diagnosis not present

## 2023-11-15 DIAGNOSIS — E119 Type 2 diabetes mellitus without complications: Secondary | ICD-10-CM | POA: Diagnosis not present

## 2023-11-15 DIAGNOSIS — M62262 Nontraumatic ischemic infarction of muscle, left lower leg: Secondary | ICD-10-CM | POA: Diagnosis not present

## 2023-11-15 DIAGNOSIS — I998 Other disorder of circulatory system: Secondary | ICD-10-CM | POA: Diagnosis not present

## 2023-11-15 DIAGNOSIS — E1122 Type 2 diabetes mellitus with diabetic chronic kidney disease: Secondary | ICD-10-CM | POA: Diagnosis not present

## 2023-11-15 DIAGNOSIS — J9811 Atelectasis: Secondary | ICD-10-CM | POA: Diagnosis not present

## 2023-11-15 DIAGNOSIS — Z833 Family history of diabetes mellitus: Secondary | ICD-10-CM | POA: Diagnosis not present

## 2023-11-15 DIAGNOSIS — Z794 Long term (current) use of insulin: Secondary | ICD-10-CM

## 2023-11-15 DIAGNOSIS — I743 Embolism and thrombosis of arteries of the lower extremities: Secondary | ICD-10-CM | POA: Diagnosis not present

## 2023-11-15 DIAGNOSIS — Z6829 Body mass index (BMI) 29.0-29.9, adult: Secondary | ICD-10-CM

## 2023-11-15 DIAGNOSIS — Z83438 Family history of other disorder of lipoprotein metabolism and other lipidemia: Secondary | ICD-10-CM

## 2023-11-15 DIAGNOSIS — Z803 Family history of malignant neoplasm of breast: Secondary | ICD-10-CM

## 2023-11-15 DIAGNOSIS — E669 Obesity, unspecified: Secondary | ICD-10-CM | POA: Diagnosis present

## 2023-11-15 DIAGNOSIS — E1159 Type 2 diabetes mellitus with other circulatory complications: Secondary | ICD-10-CM | POA: Diagnosis present

## 2023-11-15 DIAGNOSIS — N1831 Chronic kidney disease, stage 3a: Secondary | ICD-10-CM | POA: Diagnosis not present

## 2023-11-15 DIAGNOSIS — Z83719 Family history of colon polyps, unspecified: Secondary | ICD-10-CM

## 2023-11-15 DIAGNOSIS — I129 Hypertensive chronic kidney disease with stage 1 through stage 4 chronic kidney disease, or unspecified chronic kidney disease: Secondary | ICD-10-CM | POA: Diagnosis not present

## 2023-11-15 DIAGNOSIS — N2889 Other specified disorders of kidney and ureter: Secondary | ICD-10-CM | POA: Diagnosis not present

## 2023-11-15 DIAGNOSIS — I6523 Occlusion and stenosis of bilateral carotid arteries: Secondary | ICD-10-CM | POA: Diagnosis not present

## 2023-11-15 DIAGNOSIS — Z8249 Family history of ischemic heart disease and other diseases of the circulatory system: Secondary | ICD-10-CM

## 2023-11-15 DIAGNOSIS — K219 Gastro-esophageal reflux disease without esophagitis: Secondary | ICD-10-CM | POA: Diagnosis present

## 2023-11-15 DIAGNOSIS — M62261 Nontraumatic ischemic infarction of muscle, right lower leg: Secondary | ICD-10-CM | POA: Diagnosis not present

## 2023-11-15 DIAGNOSIS — Z90710 Acquired absence of both cervix and uterus: Secondary | ICD-10-CM

## 2023-11-15 DIAGNOSIS — Z7982 Long term (current) use of aspirin: Secondary | ICD-10-CM

## 2023-11-15 DIAGNOSIS — I10 Essential (primary) hypertension: Secondary | ICD-10-CM | POA: Diagnosis not present

## 2023-11-15 DIAGNOSIS — R269 Unspecified abnormalities of gait and mobility: Secondary | ICD-10-CM | POA: Diagnosis not present

## 2023-11-15 DIAGNOSIS — Z9889 Other specified postprocedural states: Secondary | ICD-10-CM | POA: Diagnosis not present

## 2023-11-15 LAB — CBC WITH DIFFERENTIAL/PLATELET
Abs Immature Granulocytes: 0.01 10*3/uL (ref 0.00–0.07)
Basophils Absolute: 0.1 10*3/uL (ref 0.0–0.1)
Basophils Relative: 1 %
Eosinophils Absolute: 0.2 10*3/uL (ref 0.0–0.5)
Eosinophils Relative: 3 %
HCT: 30.2 % — ABNORMAL LOW (ref 36.0–46.0)
Hemoglobin: 9.6 g/dL — ABNORMAL LOW (ref 12.0–15.0)
Immature Granulocytes: 0 %
Lymphocytes Relative: 45 %
Lymphs Abs: 3.2 10*3/uL (ref 0.7–4.0)
MCH: 27.7 pg (ref 26.0–34.0)
MCHC: 31.8 g/dL (ref 30.0–36.0)
MCV: 87 fL (ref 80.0–100.0)
Monocytes Absolute: 0.5 10*3/uL (ref 0.1–1.0)
Monocytes Relative: 8 %
Neutro Abs: 3 10*3/uL (ref 1.7–7.7)
Neutrophils Relative %: 43 %
Platelets: 164 10*3/uL (ref 150–400)
RBC: 3.47 MIL/uL — ABNORMAL LOW (ref 3.87–5.11)
RDW: 13.6 % (ref 11.5–15.5)
WBC: 7 10*3/uL (ref 4.0–10.5)
nRBC: 0 % (ref 0.0–0.2)

## 2023-11-15 LAB — COMPREHENSIVE METABOLIC PANEL WITH GFR
ALT: 18 U/L (ref 0–44)
AST: 17 U/L (ref 15–41)
Albumin: 3 g/dL — ABNORMAL LOW (ref 3.5–5.0)
Alkaline Phosphatase: 70 U/L (ref 38–126)
Anion gap: 9 (ref 5–15)
BUN: 23 mg/dL (ref 8–23)
CO2: 22 mmol/L (ref 22–32)
Calcium: 8.5 mg/dL — ABNORMAL LOW (ref 8.9–10.3)
Chloride: 106 mmol/L (ref 98–111)
Creatinine, Ser: 1.29 mg/dL — ABNORMAL HIGH (ref 0.44–1.00)
GFR, Estimated: 43 mL/min — ABNORMAL LOW (ref >60)
Glucose, Bld: 123 mg/dL — ABNORMAL HIGH (ref 70–99)
Potassium: 3.8 mmol/L (ref 3.5–5.1)
Sodium: 137 mmol/L (ref 135–145)
Total Bilirubin: 0.6 mg/dL (ref 0.0–1.2)
Total Protein: 6 g/dL — ABNORMAL LOW (ref 6.5–8.1)

## 2023-11-15 LAB — I-STAT CHEM 8, ED
BUN: 32 mg/dL — ABNORMAL HIGH (ref 8–23)
Calcium, Ion: 1.09 mmol/L — ABNORMAL LOW (ref 1.15–1.40)
Chloride: 107 mmol/L (ref 98–111)
Creatinine, Ser: 1.4 mg/dL — ABNORMAL HIGH (ref 0.44–1.00)
Glucose, Bld: 131 mg/dL — ABNORMAL HIGH (ref 70–99)
HCT: 35 % — ABNORMAL LOW (ref 36.0–46.0)
Hemoglobin: 11.9 g/dL — ABNORMAL LOW (ref 12.0–15.0)
Potassium: 4.5 mmol/L (ref 3.5–5.1)
Sodium: 141 mmol/L (ref 135–145)
TCO2: 28 mmol/L (ref 22–32)

## 2023-11-15 LAB — APTT: aPTT: 28 s (ref 24–36)

## 2023-11-15 LAB — VAS US ABI WITH/WO TBI
Left ABI: 0.7
Right ABI: 0.07

## 2023-11-15 LAB — PROTIME-INR
INR: 1.2 (ref 0.8–1.2)
Prothrombin Time: 15.5 s — ABNORMAL HIGH (ref 11.4–15.2)

## 2023-11-15 LAB — I-STAT CG4 LACTIC ACID, ED: Lactic Acid, Venous: 1.2 mmol/L (ref 0.5–1.9)

## 2023-11-15 LAB — CBG MONITORING, ED: Glucose-Capillary: 155 mg/dL — ABNORMAL HIGH (ref 70–99)

## 2023-11-15 MED ORDER — SENNOSIDES-DOCUSATE SODIUM 8.6-50 MG PO TABS: 1.0000 | ORAL_TABLET | Freq: Every evening | ORAL | Status: DC | PRN

## 2023-11-15 MED ORDER — HEPARIN BOLUS VIA INFUSION
4000.0000 [IU] | Freq: Once | INTRAVENOUS | Status: AC
  Administered 2023-11-15: 4000 [IU] via INTRAVENOUS
  Filled 2023-11-15: qty 4000

## 2023-11-15 MED ORDER — LOSARTAN POTASSIUM 50 MG PO TABS: 50.0000 mg | ORAL_TABLET | Freq: Every day | ORAL | Status: DC

## 2023-11-15 MED ORDER — BISACODYL 5 MG PO TBEC
5.0000 mg | DELAYED_RELEASE_TABLET | Freq: Every day | ORAL | Status: DC | PRN
  Administered 2023-11-26: 5 mg via ORAL
  Filled 2023-11-15: qty 1

## 2023-11-15 MED ORDER — ACETAMINOPHEN 325 MG PO TABS
650.0000 mg | ORAL_TABLET | Freq: Four times a day (QID) | ORAL | Status: DC | PRN
  Administered 2023-11-26: 650 mg via ORAL
  Filled 2023-11-15: qty 2

## 2023-11-15 MED ORDER — ROSUVASTATIN CALCIUM 20 MG PO TABS: 20.0000 mg | ORAL_TABLET | Freq: Every day | ORAL | Status: DC

## 2023-11-15 MED ORDER — INSULIN ASPART 100 UNIT/ML IJ SOLN
0.0000 [IU] | Freq: Every day | INTRAMUSCULAR | Status: DC
  Administered 2023-11-16: 4 [IU] via SUBCUTANEOUS
  Administered 2023-11-17 – 2023-11-25 (×4): 2 [IU] via SUBCUTANEOUS

## 2023-11-15 MED ORDER — ONDANSETRON HCL 4 MG PO TABS: 4.0000 mg | ORAL_TABLET | Freq: Four times a day (QID) | ORAL | Status: DC | PRN

## 2023-11-15 MED ORDER — LOSARTAN POTASSIUM-HCTZ 50-12.5 MG PO TABS
1.0000 | ORAL_TABLET | ORAL | Status: DC
Start: 2023-11-16 — End: 2023-11-15

## 2023-11-15 MED ORDER — ONDANSETRON HCL 4 MG/2ML IJ SOLN
4.0000 mg | Freq: Four times a day (QID) | INTRAMUSCULAR | Status: DC | PRN
  Administered 2023-11-25: 4 mg via INTRAVENOUS
  Filled 2023-11-15: qty 2

## 2023-11-15 MED ORDER — HYDRALAZINE HCL 20 MG/ML IJ SOLN
10.0000 mg | INTRAMUSCULAR | Status: DC | PRN
  Administered 2023-11-17: 10 mg via INTRAVENOUS
  Filled 2023-11-15: qty 1

## 2023-11-15 MED ORDER — HYDROCHLOROTHIAZIDE 12.5 MG PO TABS: 12.5000 mg | ORAL_TABLET | Freq: Every day | ORAL | Status: DC

## 2023-11-15 MED ORDER — HEPARIN (PORCINE) 25000 UT/250ML-% IV SOLN
1000.0000 [IU]/h | INTRAVENOUS | Status: DC
  Administered 2023-11-15: 1200 [IU]/h via INTRAVENOUS
  Filled 2023-11-15: qty 250

## 2023-11-15 MED ORDER — INSULIN ASPART 100 UNIT/ML IJ SOLN
0.0000 [IU] | Freq: Three times a day (TID) | INTRAMUSCULAR | Status: DC
  Administered 2023-11-16 (×2): 2 [IU] via SUBCUTANEOUS
  Administered 2023-11-17 (×2): 1 [IU] via SUBCUTANEOUS
  Administered 2023-11-18: 5 [IU] via SUBCUTANEOUS
  Administered 2023-11-18: 2 [IU] via SUBCUTANEOUS
  Administered 2023-11-18: 1 [IU] via SUBCUTANEOUS
  Administered 2023-11-19: 2 [IU] via SUBCUTANEOUS
  Administered 2023-11-19: 3 [IU] via SUBCUTANEOUS
  Administered 2023-11-19: 2 [IU] via SUBCUTANEOUS
  Administered 2023-11-20: 5 [IU] via SUBCUTANEOUS
  Administered 2023-11-20: 1 [IU] via SUBCUTANEOUS
  Administered 2023-11-20: 3 [IU] via SUBCUTANEOUS
  Administered 2023-11-21: 1 [IU] via SUBCUTANEOUS
  Administered 2023-11-21 (×2): 2 [IU] via SUBCUTANEOUS
  Administered 2023-11-22: 1 [IU] via SUBCUTANEOUS
  Administered 2023-11-23: 2 [IU] via SUBCUTANEOUS
  Administered 2023-11-23: 5 [IU] via SUBCUTANEOUS
  Administered 2023-11-23: 7 [IU] via SUBCUTANEOUS
  Administered 2023-11-24: 1 [IU] via SUBCUTANEOUS
  Administered 2023-11-24 (×2): 3 [IU] via SUBCUTANEOUS
  Administered 2023-11-25 (×2): 2 [IU] via SUBCUTANEOUS
  Administered 2023-11-26 (×2): 1 [IU] via SUBCUTANEOUS
  Administered 2023-11-26 – 2023-11-27 (×2): 2 [IU] via SUBCUTANEOUS
  Administered 2023-11-27: 3 [IU] via SUBCUTANEOUS

## 2023-11-15 MED ORDER — EZETIMIBE 10 MG PO TABS
10.0000 mg | ORAL_TABLET | Freq: Every evening | ORAL | Status: DC
  Administered 2023-11-15: 10 mg via ORAL
  Filled 2023-11-15: qty 1

## 2023-11-15 MED ORDER — SODIUM CHLORIDE 0.9% FLUSH
3.0000 mL | Freq: Two times a day (BID) | INTRAVENOUS | Status: DC
  Administered 2023-11-15 – 2023-11-27 (×13): 3 mL via INTRAVENOUS

## 2023-11-15 MED ORDER — ACETAMINOPHEN 650 MG RE SUPP: 650.0000 mg | Freq: Four times a day (QID) | RECTAL | Status: DC | PRN

## 2023-11-15 MED ORDER — IOHEXOL 350 MG/ML SOLN
85.0000 mL | Freq: Once | INTRAVENOUS | Status: AC | PRN
  Administered 2023-11-15: 85 mL via INTRAVENOUS

## 2023-11-15 MED ORDER — AMLODIPINE BESYLATE 10 MG PO TABS
10.0000 mg | ORAL_TABLET | Freq: Every day | ORAL | Status: DC
  Administered 2023-11-17 – 2023-11-23 (×6): 10 mg via ORAL
  Filled 2023-11-15 (×6): qty 1

## 2023-11-15 MED ORDER — HYDROCODONE-ACETAMINOPHEN 5-325 MG PO TABS
1.0000 | ORAL_TABLET | ORAL | Status: DC | PRN
  Administered 2023-11-16 – 2023-11-19 (×6): 2 via ORAL
  Filled 2023-11-15 (×7): qty 2

## 2023-11-15 MED ORDER — ASPIRIN 81 MG PO CHEW
81.0000 mg | CHEWABLE_TABLET | Freq: Every day | ORAL | Status: DC
  Administered 2023-11-17 – 2023-11-18 (×2): 81 mg via ORAL
  Filled 2023-11-15 (×2): qty 1

## 2023-11-15 NOTE — Progress Notes (Signed)
 Bilateral ankle/brachial indices performed. Preliminary results show a right ABI of 0.07 and a left ABI of 0.70. The right ABI indicates evidence of critical limb ischemia. Attempted to call results to Bertha Broad, NP, who was out of the office. Left voicemail for Dr. Rosellen Conners nurse to return call regarding test results. Final report to follow.  Harless Lien, RVT

## 2023-11-15 NOTE — Hospital Course (Addendum)
 Catherine Ramsey is a 78 y.o. female with medical history significant for PVD s/p LLE ATA laser atherectomy and balloon angioplasty (10/15/2021), T2DM, CKD stage IIIa, HTN, HLD who is admitted with acute on chronic critical limb ischemia of the right lower extremity. Vascular surgery was consulted. 4/16 underwent angiography and tPA therapy. 4/17 repeat angiogram, unsuccessful recanalization of right TP trunk. 4/18 transferred to floor. 4/22 underwent right common femoral to peroneal artery bypass  Assessment and Plan: Acute on chronic critical limb ischemia right lower extremity History of intervention to the left anterior tibial artery 2023 by IR CTA runoff study shows acute occlusive thrombus of the right popliteal artery behind the knee, moderate to severe stenosis above the knee. Vascular surgery was consulted. Underwent angiography on 4/16, treated with tPA for below popliteal stenosis. On 4/17 repeat angiography, remaining occlusion appeared to have chronic component and was not amenable to intervention.. Vascular surgery recommended and patient underwent right femoral-peroneal bypass on 4/22. - heparin  held 4/24; 1 unit PRBC given for Hgb 6.5 g/dL - Hgb stable at d/c, 7.9 g/dL  Normocytic anemia: Baseline hemoglobin appears to be around 11 - s/p 1 unit PRBC on 4/24 - see above   Low grade fever - resolved Leukocytosis - resolved  - likely some atelectasis from prolonged recovery; low suspicion for infection - CXR clear; UA negative for infection - for now encourage ambulation as able and IS use   Type 2 diabetes melitis uncontrolled with hyperglycemia without long-term insulin  use. Hold home meds and placed on SSI.  On Lantus  at home.  Resume Lantus  on 4/23. Hemoglobin A1c 9.8.   - Resume home regimen   CKD stage IIIa: - patient has history of CKD3b. Baseline creat ~ 1, eGFR~ 50-55   Hypertension: -Resume home regimen   Hyperlipidemia: Resume rosuvastatin  and Zetia 

## 2023-11-15 NOTE — ED Provider Triage Note (Signed)
 Emergency Medicine Provider Triage Evaluation Note  Catherine Ramsey , a 78 y.o. female  was evaluated in triage.  Pt complains of right leg pain. Started Saturday with numbness as well. Hx diabetes. Had ABIs today and was told to come in due to critical limb ischemia  Review of Systems  Positive:  Negative:   Physical Exam  BP (!) 156/47 (BP Location: Right Arm)   Pulse 100   Temp 99.1 F (37.3 C)   Resp 16   Ht 5\' 7"  (1.702 m)   Wt 85.7 kg   SpO2 97%   BMI 29.60 kg/m  Gen:   Awake, no distress   Resp:  Normal effort  MSK:   Moves extremities without difficulty  Other:  DP pulses palpable by doppler, PT not found. Leg is cold and numbness to the ankle.   Medical Decision Making  Medically screening exam initiated at 3:43 PM.  Appropriate orders placed.  Catherine Ramsey was informed that the remainder of the evaluation will be completed by another provider, this initial triage assessment does not replace that evaluation, and the importance of remaining in the ED until their evaluation is complete.  Informed staff patient needs next available room due to concern for critical limb ischemia.   Catherine Dk, PA-C 11/15/23 1545

## 2023-11-15 NOTE — Progress Notes (Signed)
 PHARMACY - ANTICOAGULATION CONSULT NOTE  Pharmacy Consult for heparin Indication: leg ischemia  Allergies  Allergen Reactions   Lovastatin Itching and Rash   Nitrofurantoin Rash    Patient Measurements: Height: 5\' 7"  (170.2 cm) Weight: 85.7 kg (189 lb) IBW/kg (Calculated) : 61.6 HEPARIN DW (KG): 79.6  Vital Signs: Temp: 99.1 F (37.3 C) (04/15 1528) BP: 156/47 (04/15 1528) Pulse Rate: 100 (04/15 1528)  Labs: Recent Labs    11/15/23 1546 11/15/23 1657  HGB 9.6* 11.9*  HCT 30.2* 35.0*  PLT 164  --   APTT 28  --   LABPROT 15.5*  --   INR 1.2  --   CREATININE 1.29* 1.40*    Estimated Creatinine Clearance: 38.4 mL/min (A) (by C-G formula based on SCr of 1.4 mg/dL (H)).   Medical History: Past Medical History:  Diagnosis Date   Achalasia - Type II 05/08/2010   Qualifier: Diagnosis of  By: Willy Harvest MD, Kelleen Patee E    Acute kidney injury (HCC) 02/26/2019   Candida esophagitis (HCC) 03/26/2014   Cataract    COVID-19 virus detected 02/25/2019   Diabetes mellitus    type 2   Family history of adverse reaction to anesthesia    son hard to wake up   GERD (gastroesophageal reflux disease)    Hyperlipidemia    Hypertension    Primary hypertension 02/25/2019   Syncope 02/25/2019   Trigger ring finger of right hand 10/07/2016    Medications:  (Not in a hospital admission)  Scheduled:   Assessment: 46 YOF presented with numbness in the right foot, found to have acute on chronic limb ischemia. PMH significant for HLD and PAD. Patient not on anticoagulation prior to hospitalization. Pharmacy consulted for heparin dosing.   4/15: Hgb 9.6, PLT 164  Goal of Therapy:  Heparin level 0.3-0.7 units/ml Monitor platelets by anticoagulation protocol: Yes   Plan:  Heparin 4000 units IV x1, then heparin start infusion at 1200 units/hr Monitor anti-Xa level in 8 hours Monitor CBC and s/sx of bleeding daily  F/u [facility] plans  Volney Grumbles, PharmD PGY-1 Acute Care  Pharmacy Resident 11/15/2023 6:54 PM

## 2023-11-15 NOTE — Progress Notes (Signed)
 PIV consult: Pt in CT: IV already established.

## 2023-11-15 NOTE — ED Provider Notes (Signed)
 [facility] AT [facility] Provider Note   CSN: [id number] Arrival date & time: 11/15/23  1458     History  Chief Complaint  Patient presents with   Leg Pain    Catherine Ramsey is a 78 y.o. female with PMH as listed below who arrives in wheelchair by POV sent after [place] of right leg showing "a right ABI of 0.07 and a left ABI of 0.70. The right ABI indicates evidence of critical limb ischemia." Right foot cold in triage. Pulse found with doppler in triage.  Patient reports RLE pain and numbness from her knee down since Sunday (2 days ago). No h/o similar. Has h/o fempop bypass on LLE but never on right. Endorses numbness especially in the bottom of the foot.  Doesn't take blood thinners, just aspirin daily. Per chart review, was seen yesterday for PVD, cool skin and right foot/leg pain, ABIs were ordered.   Past Medical History:  Diagnosis Date   Achalasia - Type II 05/08/2010   Qualifier: Diagnosis of  By: Leone Payor MD, Alfonse Ras E    Acute kidney injury (HCC) 02/26/2019   Candida esophagitis (HCC) 03/26/2014   Cataract    COVID-19 virus detected 02/25/2019   Diabetes mellitus    type 2   Family history of adverse reaction to anesthesia    son hard to wake up   GERD (gastroesophageal reflux disease)    Hyperlipidemia    Hypertension    Primary hypertension 02/25/2019   Syncope 02/25/2019   Trigger ring finger of right hand 10/07/2016       Home Medications Prior to Admission medications   Medication Sig Start Date End Date Taking? Authorizing Provider  acetaminophen (TYLENOL) 500 MG tablet Take 1,000 mg by mouth every 6 (six) hours as needed for moderate pain.    [provider]  Alcohol Swabs (B-D SINGLE USE SWABS REGULAR) PADS  12/10/19   [provider]  amLODipine (NORVASC) 10 MG tablet Take 10 mg by mouth every evening.    [provider]  aspirin 81 MG chewable tablet Chew 81 mg by mouth daily.    [provider]  Blood Glucose Monitoring Suppl (TRUE METRIX AIR GLUCOSE METER) w/Device KIT  11/30/19   [provider]  cilostazol (PLETAL) 50 MG tablet Take 1 tablet (50 mg total) by mouth 2 (two) times daily. 11/25/22   Yates Decamp, MD  CINNAMON PO Take 1 capsule by mouth daily. With chromium    [provider]  empagliflozin (JARDIANCE) 10 MG TABS tablet Take 10 mg by mouth daily.    [provider]  ezetimibe (ZETIA) 10 MG tablet Take 1 tablet (10 mg total) by mouth every evening. 11/25/22 11/20/23  Yates Decamp, MD  gabapentin (NEURONTIN) 300 MG capsule Take 300 mg by mouth at bedtime.    [provider]  ibuprofen (ADVIL) 200 MG tablet Take 400 mg by mouth daily as needed for moderate pain.    [provider]  insulin aspart (NOVOLOG) 100 UNIT/ML FlexPen Inject 9 Units into the skin 3 (three) times daily as needed for high blood sugar.    [provider]  Insulin Syringe-Needle U-100 31G X 5/16" 0.5 ML MISC Use one syringe twice daily DX:  250.70 09/26/12   [provider]  KLOR-CON M20 20 MEQ tablet Take 20 mEq by mouth daily. 09/06/17   [provider]  LANTUS SOLOSTAR 100 UNIT/ML Solostar Pen Inject 17 Units into the skin  daily. 10/24/19   [provider]  Liniments (BLUE-EMU SUPER STRENGTH) CREA Apply 1 application. topically 3 (three) times daily.    [provider]  losartan-hydrochlorothiazide (HYZAAR) 50-12.5 MG tablet Take 1 tablet by mouth every morning.    [provider]  omeprazole (PRILOSEC) 20 MG capsule Take 20 mg by mouth daily as needed (acid reflux).    [provider]  rosuvastatin (CRESTOR) 20 MG tablet TAKE 1 TABLET EVERY DAY (DISCONTINUE PRAVASTATIN) 07/22/23   Yates Decamp, MD  terbinafine (LAMISIL) 250 MG tablet 1 tablet Orally Once a day for 90 days for toenail fungus 09/29/22   [provider]  TRUE METRIX BLOOD GLUCOSE TEST test strip  11/30/19   [provider]  TRUEplus Lancets 33G MISC  11/30/19   [provider]      Allergies    Lovastatin    Review of Systems   Review of Systems A 10 point review of systems was performed and is negative unless otherwise reported in HPI.  Physical Exam Updated Vital Signs BP (!) 156/47 (BP Location: Right Arm)   Pulse 100   Temp 99.1 F (37.3 C)   Resp 16   Ht 5\' 7"  (1.702 m)   Wt 85.7 kg   SpO2 97%   BMI 29.60 kg/m  Physical Exam General: Normal appearing female, lying in bed.  HEENT: PERRLA, Sclera anicteric, MMM, trachea midline.  Cardiology: RRR, no murmurs/rubs/gallops. BL radial and DP pulses equal bilaterally.  Resp: Normal respiratory rate and effort. CTAB, no wheezes, rhonchi, crackles.  Abd: Soft, non-tender, non-distended. No rebound tenderness or guarding.  GU: Deferred. MSK: Cool feet bilaterally, R>L. Doppler pulses in BL LEs, able to move bilateral feet. No swelling bilaterally. Decreased sensation in bottom of right foot.  No peripheral edema or signs of trauma. Extremities without deformity or TTP. No cyanosis or clubbing. Skin: warm, dry.  Neuro: A&Ox4, CNs II-XII grossly intact. MAEs. Sensation grossly intact.  Psych: Normal mood and affect.   ED Results / Procedures / Treatments   Labs (all labs ordered are listed, but only abnormal results are displayed) Labs Reviewed  I-STAT CHEM 8, ED - Abnormal; Notable for the following components:      Result Value   BUN 32 (*)    Creatinine, Ser 1.40 (*)    Glucose, Bld 131 (*)    Calcium, Ion 1.09 (*)    Hemoglobin 11.9 (*)    HCT 35.0 (*)    All other components within normal limits  CBC WITH DIFFERENTIAL/PLATELET  COMPREHENSIVE METABOLIC PANEL WITH GFR  PROTIME-INR  APTT  I-STAT CG4 LACTIC ACID, ED  I-STAT CG4 LACTIC ACID, ED    EKG None  Radiology VAS [place] ABI WITH/WO TBI Result Date: 11/15/2023  LOWER EXTREMITY DOPPLER STUDY Patient Name:  Catherine Ramsey  Date of Exam:   11/15/2023 Medical  Rec #: [id number]        Accession #:    [id number] Date of Birth: 05-Nov-1945        Patient Gender: F Patient Age:   48 years Exam Location:  [facility] Procedure:      VAS [place] ABI WITH/WO TBI Referring Phys: STEPHANIE EDWARDS --------------------------------------------------------------------------------  Indications: Rest pain, and peripheral artery disease. Patient presents with              pain in the right leg all the way from her toes up to the thigh.  She reports the right leg has been feeling heavy, cold and numb.              The pain is present all the time and all through the night and she              has been unable to sleep. Hanging the leg down helps alleviate the              symptoms slightly. She has a history of left anterior tibial              atherectomy and angioplasty done 10/15/21. Angiogram 10/07/21 showed              right TP trunk occlusion, occluded posterior tibial artery and              segmental occlusions of the anterior tibial artery. Left peroneal              artery occlusion, left posterior tibial artery occlusion and              diffuse disease of the anterior tibial artery. High Risk Factors: Hypertension, hyperlipidemia, Diabetes, no history of                    smoking.  Vascular Interventions: Left anterior tibial laser atherectomy followed by                         balloon angioplasty 10/15/21. Comparison Study: Previous ABI 10/27/22 performed at [facility]                   showed right ABI of 0.96 with monophasic waveforms and left                   ABI of 0.97 with monophasic waveforms. ABI at [facility] 09/24/21                   showed right ABI of 0.62 on the right and 0.70 on the left                   with bilateral TBI's of 0. Performing Technologist: Olegario Shearer RVT Supporting Technologist: Carlos American RVT, RDCS (AE), RDMS  Examination Guidelines: A complete evaluation includes at minimum, Doppler waveform signals and systolic blood pressure  reading at the level of bilateral brachial, anterior tibial, and posterior tibial arteries, when vessel segments are accessible. Bilateral testing is considered an integral part of a complete examination. Photoelectric Plethysmograph (PPG) waveforms and toe systolic pressure readings are included as required and additional duplex testing as needed. Limited examinations for reoccurring indications may be performed as noted.  ABI Findings: +---------+------------------+-----+-------------------+--------------+ Right    Rt Pressure (mmHg)IndexWaveform           Comment        +---------+------------------+-----+-------------------+--------------+ Brachial 129                                                      +---------+------------------+-----+-------------------+--------------+ PTA                             absent                            +---------+------------------+-----+-------------------+--------------+  PERO                            absent                            +---------+------------------+-----+-------------------+--------------+ DP       9                 0.07 dampened monophasicbarely audible +---------+------------------+-----+-------------------+--------------+ Great Toe0                 0.00 Absent                            +---------+------------------+-----+-------------------+--------------+ +---------+------------------+-----+----------+-------+ Left     Lt Pressure (mmHg)IndexWaveform  Comment +---------+------------------+-----+----------+-------+ Brachial 113                                      +---------+------------------+-----+----------+-------+ PTA      90                0.70 monophasic        +---------+------------------+-----+----------+-------+ DP       90                0.70 monophasic        +---------+------------------+-----+----------+-------+ Great Toe31                0.24 Abnormal           +---------+------------------+-----+----------+-------+ +-------+-----------+-----------+------------+------------+ ABI/TBIToday's ABIToday's TBIPrevious ABIPrevious TBI +-------+-----------+-----------+------------+------------+ Right  0.07       0          0.62        0            +-------+-----------+-----------+------------+------------+ Left   0.70       0          0.70        0            +-------+-----------+-----------+------------+------------+  Right ABIs appear essentially unchanged compared to prior study on 09/24/21. Left ABIs appear essentially unchanged compared to prior study on 09/24/21.  Findings reported to Dr. Silvano Rusk nurse, Henderson Newcomer, via voicemail at 12:15 pm. EPIC documentation of preliminary results made.  Summary: Right: Resting right ankle-brachial index indicates critical limb ischemia. The right toe-brachial index is abnormal. Left: Resting left ankle-brachial index indicates moderate left lower extremity arterial disease. The left toe-brachial index is abnormal. *See table(s) above for measurements and observations.  Suggest Peripheral Vascular Consult.    Preliminary     Procedures .Ultrasound ED Peripheral IV (Provider)  Date/Time: 11/15/2023 5:22 PM  Performed by: Loetta Rough, MD Authorized by: Loetta Rough, MD   Procedure details:    Indications: multiple failed IV attempts and poor IV access     Skin Prep: chlorhexidine gluconate     Location:  Right AC   Angiocath:  20 G   Bedside Ultrasound Guided: Yes     Images: not archived     Patient tolerated procedure without complications: Yes     Dressing applied: Yes   .Ultrasound ED Peripheral IV (Provider)  Date/Time: 11/15/2023 5:44 PM  Performed by: Loetta Rough, MD Authorized by: Loetta Rough, MD   Procedure details:    Indications: multiple failed IV attempts and poor IV access  Skin Prep: chlorhexidine gluconate     Location:  Left AC   Angiocath:  18 G   Bedside  Ultrasound Guided: Yes     Images: not archived     Patient tolerated procedure without complications: Yes   .Critical Care  Performed by: Loetta Rough, MD Authorized by: Loetta Rough, MD   Critical care provider statement:    Critical care time (minutes):  30   Critical care was necessary to treat or prevent imminent or life-threatening deterioration of the following conditions: limb ischemia.   Critical care was time spent personally by me on the following activities:  Development of treatment plan with patient or surrogate, discussions with consultants, evaluation of patient's response to treatment, examination of patient, ordering and review of laboratory studies, ordering and review of radiographic studies, ordering and performing treatments and interventions, pulse oximetry, re-evaluation of patient's condition and review of old charts     Medications Ordered in ED Medications  heparin bolus via infusion 4,000 Units (4,000 Units Intravenous Bolus from Bag 11/15/23 1912)    Followed by  heparin ADULT infusion 100 units/mL (25000 units/222mL) (1,200 Units/hr Intravenous New Bag/Given 11/15/23 1914)  iohexol (OMNIPAQUE) 350 MG/ML injection 85 mL (85 mLs Intravenous Contrast Given 11/15/23 1748)    ED Course/ Medical Decision Making/ A&P                          Medical Decision Making Amount and/or Complexity of Data Reviewed Labs: ordered. Radiology: ordered.  Risk Prescription drug management. Decision regarding hospitalization.    This patient presents to the ED for concern of critical limb ischemia, this involves an extensive number of treatment options, and is a complaint that carries with it a high risk of complications and morbidity.  I considered the following differential and admission for this acute, potentially life threatening condition.   MDM:    Patient with right leg pain and numbness with history of PAD.  Patient with difficult to palpate pulses in the  right lower extremity but able to Doppler DP and PT.  Greatest concern for acute limb ischemia given ABIs that were done today showed right ABI 0.07.  No wounds or gangrene noted on the right lower extremity.  No deformity, trauma, or evidence of compartment syndrome.  Patient able to move the extremity and has full range of motion.  Will discuss with vascular surgery and obtain CTA with runoff.  Patient will need admission.  Clinical Course as of 11/15/23 2030  Tue Nov 15, 2023  1635 Vascular surgery consulted. Vascular will come to see patient. [HN]  1701 Activated code medical to expedite CT scan. RN taking patient to CT now. [HN]  1722 [place] IV access obtained. Vascular came to see patient, recommended heparin and admission to medicine. [HN]  1856 Admitted to Dr. Allena Katz with triad [HN]    Clinical Course User Index [HN] Loetta Rough, MD    Labs: I Ordered, and personally interpreted labs.  The pertinent results include: Those listed above  Imaging Studies ordered: I ordered imaging studies including CTA with runoff I independently visualized and interpreted imaging. I agree with the radiologist interpretation  Additional history obtained from chart review.    Reevaluation: After the interventions noted above, I reevaluated the patient and found that they have :stayed the same  Social Determinants of Health:  lives independently  Disposition: Admit to medicine with vascular following  Co morbidities that complicate the patient  evaluation  Past Medical History:  Diagnosis Date   Achalasia - Type II 05/08/2010   Qualifier: Diagnosis of  By: Willy Harvest MD, Kelleen Patee E    Acute kidney injury (HCC) 02/26/2019   Candida esophagitis (HCC) 03/26/2014   Cataract    COVID-19 virus detected 02/25/2019   Diabetes mellitus    type 2   Family history of adverse reaction to anesthesia    son hard to wake up   GERD (gastroesophageal reflux disease)    Hyperlipidemia    Hypertension     Primary hypertension 02/25/2019   Syncope 02/25/2019   Trigger ring finger of right hand 10/07/2016     Medicines No orders of the defined types were placed in this encounter.   I have reviewed the patients home medicines and have made adjustments as needed  Problem List / ED Course: Problem List Items Addressed This Visit   None Visit Diagnoses       Acute lower limb ischemia    -  Primary   Relevant Medications   heparin bolus via infusion 4,000 Units (Completed)   heparin ADULT infusion 100 units/mL (25000 units/250mL)                   This note was created using dictation software, which may contain spelling or grammatical errors.    Merdis Stalling, MD 11/15/23 2032

## 2023-11-15 NOTE — H&P (Signed)
 History and Physical    Catherine [facility] EAV:[id number] DOB: 12/30/1945 DOA: 11/15/2023  PCP: Catherine Broad, MD  Patient coming from: Home  I have personally briefly reviewed patient's old medical records in [facility]  Chief Complaint: Right lower leg numbness  HPI: Catherine [facility] is a 78 y.o. female with medical history significant for PVD s/p LLE ATA laser atherectomy and balloon angioplasty (10/15/2021), T2DM, CKD stage IIIb, HTN, HLD who presented to the ED for evaluation of right leg pain and numbness.  Patient states that she developed new pain, numbness, and cold sensation of her distal right lower extremity over this weekend.  She underwent ABIs today that were consistent with critical ischemia to her right lower extremity and moderate left lower extremity arterial disease.  Patient states that she did have an infection involving the toes on her right foot a month ago and was treated with antibiotics with resolution.  She otherwise denies fevers, chills, diaphoresis, chest pain, dyspnea, nausea, vomiting.  She denies any obvious bleeding.  She denies tobacco use.  ED Course  Labs/Imaging on admission: I have personally reviewed following labs and imaging studies.  Initial vitals showed BP 156/47, pulse 100, RR 16, temp 99.1 F, SpO2 97% on room air.  Labs show WBC 7.0, hemoglobin 9.6, platelets 164, sodium 137, potassium 3.8, bicarb 22, BUN 23, creatinine 1.29, serum glucose 123, LFTs within normal limits, lactic acid 1.2.  Vascular surgery were consulted and recommended starting IV heparin, medical admission, n.p.o. after midnight.  CTA A/P with runoff is pending.  The hospitalist service was consulted to admit.  Review of Systems: All systems reviewed and are negative except as documented in history of present illness above.   Past Medical History:  Diagnosis Date   Achalasia - Type II 05/08/2010   Qualifier: Diagnosis of  By: Willy Harvest MD, Kelleen Patee E    Acute  kidney injury (HCC) 02/26/2019   Candida esophagitis (HCC) 03/26/2014   Cataract    COVID-19 virus detected 02/25/2019   Diabetes mellitus    type 2   Family history of adverse reaction to anesthesia    son hard to wake up   GERD (gastroesophageal reflux disease)    Hyperlipidemia    Hypertension    Primary hypertension 02/25/2019   Syncope 02/25/2019   Trigger ring finger of right hand 10/07/2016    Past Surgical History:  Procedure Laterality Date   ABDOMINAL HYSTERECTOMY     BREAST BIOPSY     left/benign   COLONOSCOPY  01/31/2014   ESOPHAGEAL MANOMETRY N/A 04/01/2014   Procedure: ESOPHAGEAL MANOMETRY (EM);  Surgeon: Kenney Peacemaker, MD;  Location: [facility];  Service: Endoscopy;  Laterality: N/A;   HELLER MYOTOMY N/A 12/07/2016   Procedure: LAPAROSCOPIC HELLER MYOTOMY, DOR FUNDOPLICATION, HIATAL HERNIA REPAIR, INTRAOPERATIVE UPPER ENDOSCOPY;  Surgeon: Adalberto Hollow, MD;  Location: [facility];  Service: General;  Laterality: N/A;   IR ANGIOGRAM PELVIS SELECTIVE OR SUPRASELECTIVE  10/20/2021   IR RADIOLOGIST EVAL & MGMT  10/01/2021   IR RADIOLOGIST EVAL & MGMT  11/25/2021   IR RADIOLOGIST EVAL & MGMT  11/04/2022   IR TIB-PERO ART ATHEREC INC PTA MOD SED  10/15/2021   IR US  GUIDE VASC ACCESS RIGHT  10/15/2021   TOTAL HIP ARTHROPLASTY     right pin then replacement   UPPER GASTROINTESTINAL ENDOSCOPY      Social History: Social History   Tobacco Use   Smoking status: Never   Smokeless tobacco: Never  Vaping Use   Vaping status: Never Used  Substance Use Topics   Alcohol use: No   Drug use: No   Allergies  Allergen Reactions   Lovastatin Itching and Rash   Nitrofurantoin Rash    Family History  Problem Relation Age of Onset   Diabetes Mother        died at 63 from Covid-19   Hypertension Mother    Hyperlipidemia Mother    Breast cancer Mother    Diabetes Father    Hypertension Father    Hypertension Sister    Hypertension Sister    Colon polyps Daughter     Colon cancer Neg Hx    Esophageal cancer Neg Hx    Rectal cancer Neg Hx    Stomach cancer Neg Hx      Prior to Admission medications   Medication Sig Start Date End Date Taking? Authorizing Provider  acetaminophen (TYLENOL) 500 MG tablet Take 1,000 mg by mouth every 6 (six) hours as needed for moderate pain.   Yes [provider]  amLODipine (NORVASC) 10 MG tablet Take 10 mg by mouth daily.   Yes [provider]  aspirin 81 MG chewable tablet Chew 81 mg by mouth daily.   Yes [provider]  cilostazol (PLETAL) 50 MG tablet Take 1 tablet (50 mg total) by mouth 2 (two) times daily. 11/25/22  Yes Knox Perl, MD  doxycycline (VIBRAMYCIN) 100 MG capsule Take 100 mg by mouth 2 (two) times daily. 11/05/23 11/16/23 Yes [provider]  ezetimibe (ZETIA) 10 MG tablet Take 1 tablet (10 mg total) by mouth every evening. 11/25/22 11/20/23 Yes Knox Perl, MD  ibuprofen (ADVIL) 200 MG tablet Take 400 mg by mouth daily as needed for moderate pain.   Yes [provider]  insulin aspart (NOVOLOG) 100 UNIT/ML FlexPen Inject 9 Units into the skin 3 (three) times daily as needed for high blood sugar.   Yes [provider]  KLOR-CON M20 20 MEQ tablet Take 20 mEq by mouth daily. 09/06/17  Yes [provider]  losartan-hydrochlorothiazide (HYZAAR) 50-12.5 MG tablet Take 1 tablet by mouth every morning.   Yes [provider]  omeprazole (PRILOSEC) 20 MG capsule Take 20 mg by mouth daily as needed (acid reflux).   Yes [provider]  rosuvastatin (CRESTOR) 20 MG tablet TAKE 1 TABLET EVERY DAY (DISCONTINUE PRAVASTATIN) 07/22/23  Yes Knox Perl, MD  Alcohol Swabs (B-D SINGLE USE SWABS REGULAR) PADS  12/10/19   [provider]  Blood Glucose Monitoring Suppl (TRUE METRIX AIR GLUCOSE METER) w/Device KIT  11/30/19   [provider]  Insulin Syringe-Needle U-100 31G X 5/16" 0.5 ML MISC Use one syringe twice daily DX:  250.70  09/26/12   [provider]  TRUE METRIX BLOOD GLUCOSE TEST test strip  11/30/19   [provider]  TRUEplus Lancets 33G MISC  11/30/19   [provider]    Physical Exam: Vitals:   11/15/23 1915 11/15/23 1947 11/15/23 2030 11/15/23 2115  BP: (!) 168/69  (!) 164/70 (!) 174/75  Pulse: 85  79 81  Resp: 15  (!) 22 (!) 25  Temp:  99 F (37.2 C)    TempSrc:  Oral    SpO2: 100%  100% 100%  Weight:      Height:       Constitutional: Resting in bed, NAD, calm, comfortable Eyes: EOMI, lids and conjunctivae normal ENMT: Mucous membranes are moist. Posterior pharynx clear of any exudate or  lesions.Normal dentition.  Neck: normal, supple, no masses. Respiratory: clear to auscultation bilaterally, no wheezing, no crackles. Normal respiratory effort. No accessory muscle use.  Cardiovascular: Regular rate and rhythm, no murmurs / rubs / gallops. No extremity edema.  Unable to palpate pedal pulse on right foot, left pedal pulses intact.  Distal right lower extremity is cool to touch. Abdomen: no tenderness, no masses palpated. Musculoskeletal: no clubbing / cyanosis. No joint deformity upper and lower extremities. Good ROM, no contractures. Normal muscle tone.  Skin: no rashes, lesions, ulcers. No induration Neurologic:  Strength 5/5 in all 4.  Psychiatric: Normal judgment and insight. Alert and oriented x 3. Normal mood.   EKG: Not performed.  Assessment/Plan Principal Problem:   Critical limb ischemia of right lower extremity (HCC) Active Problems:   Hypertension associated with diabetes (HCC)   Hyperlipidemia associated with type 2 diabetes mellitus (HCC)   Type 2 diabetes mellitus with obesity (HCC)   Peripheral arterial disease (HCC)   Chronic kidney disease, stage 3b (HCC)   Normocytic anemia   Catherine [facility] is a 78 y.o. female with medical history significant for PVD s/p LLE ATA laser atherectomy and balloon angioplasty (10/15/2021), T2DM, CKD stage IIIb,  HTN, HLD who is admitted with acute on chronic critical limb ischemia of the right lower extremity.  Vascular surgery following and planning for thrombectomy.  Assessment and Plan: Acute on chronic critical limb ischemia right lower extremity History of intervention to the left anterior tibial artery 2023 by IR: CTA runoff study shows acute occlusive thrombus of the right popliteal artery behind the knee, moderate to severe stenosis above the knee. - Vascular surgery following, plan for Endo versus open thrombectomy tomorrow - Started on IV heparin - Keep n.p.o. after midnight  Normocytic anemia: Hemoglobin 9.6 on admit, was 11.62 years ago.  Patient has not seen any obvious bleeding.  Monitor closely while on IV heparin.  Type 2 diabetes: Hold home meds and placed on SSI.  CKD stage IIIb: Renal function stable, continue to monitor.  Hypertension: Continue amlodipine, losartan, HCTZ.  Hyperlipidemia: Continue rosuvastatin and Zetia.   DVT prophylaxis: IV heparin Code Status: Full code, confirmed with patient on admission Family Communication: Husband and sister-in-law at bedside Disposition Plan: From home, dispo pending clinical progress Consults called: Vascular surgery Severity of Illness: The appropriate patient status for this patient is INPATIENT. Inpatient status is judged to be reasonable and necessary in order to provide the required intensity of service to ensure the patient's safety. The patient's presenting symptoms, physical exam findings, and initial radiographic and laboratory data in the context of their chronic comorbidities is felt to place them at high risk for further clinical deterioration. Furthermore, it is not anticipated that the patient will be medically stable for discharge from the [facility] within 2 midnights of admission.   * I certify that at the point of admission it is my clinical judgment that the patient will require inpatient [facility] care spanning  beyond 2 midnights from the point of admission due to high intensity of service, high risk for further deterioration and high frequency of surveillance required.Edith Gores MD [facility]  If 7PM-7AM, please contact night-coverage [email or web address]  11/15/2023, 9:51 PM

## 2023-11-15 NOTE — ED Triage Notes (Signed)
 Patient arrives in wheelchair by POV sent after US  of right leg showing "a right ABI of 0.07 and a left ABI of 0.70. The right ABI indicates evidence of critical limb ischemia." Right foot cold in triage. Pulse found with doppler in triage.

## 2023-11-15 NOTE — Consult Note (Addendum)
 Hospital Consult    Reason for Consult:  right leg pain Requesting Physician:  ER MRN #:  [id number]  History of Present Illness: This is a 78 y.o. female who presented to the hospital with numbness in the right foot to about the ankle.  She states that this has been going on since Saturday.  A couple of weeks ago, she was at a funeral and someone stepped on her toes.  She states this was not an issue until Saturday.  She states that she has had to hang her foot off the bed for improvement in her sx.   She had an ABI today that was essentially zero on the right and 0.7 on the left.  Toe pressure on the right is zero and 31 on the left.  She can wiggle her toes and flex her foot at the ankle.  She is compliant with her Crestor and asa.  She denies any hx of afib or palpitations.    She has hx of diabetes and on insulin, HTN, HLD, PAD.  Carotid duplex last year reveals mild bilateral carotid artery stenosis.    Pt was seen by Podiatry yesterday and she had absent palpable pedal pulses and feet felt cold bilaterally.    She has hx of angiogram BLE with left sided ATA with laser atherectomy and balloon angioplasty on 10/15/2021 by IR.  Pt is followed by Dr. Jacinto Halim for PVD.    The pt is on a statin for cholesterol management.  The pt is on a daily aspirin.   Other AC:  Pletal The pt is on CCB, ARB, HCTZ for hypertension.   The pt is  on medication for diabetes PTA. Tobacco hx:  never  Past Medical History:  Diagnosis Date   Achalasia - Type II 05/08/2010   Qualifier: Diagnosis of  By: Leone Payor MD, Alfonse Ras E    Acute kidney injury (HCC) 02/26/2019   Candida esophagitis (HCC) 03/26/2014   Cataract    COVID-19 virus detected 02/25/2019   Diabetes mellitus    type 2   Family history of adverse reaction to anesthesia    son hard to wake up   GERD (gastroesophageal reflux disease)    Hyperlipidemia    Hypertension    Primary hypertension 02/25/2019   Syncope 02/25/2019   Trigger ring  finger of right hand 10/07/2016    Past Surgical History:  Procedure Laterality Date   ABDOMINAL HYSTERECTOMY     BREAST BIOPSY     left/benign   COLONOSCOPY  01/31/2014   ESOPHAGEAL MANOMETRY N/A 04/01/2014   Procedure: ESOPHAGEAL MANOMETRY (EM);  Surgeon: Iva Boop, MD;  Location: [facility];  Service: Endoscopy;  Laterality: N/A;   HELLER MYOTOMY N/A 12/07/2016   Procedure: LAPAROSCOPIC HELLER MYOTOMY, DOR FUNDOPLICATION, HIATAL HERNIA REPAIR, INTRAOPERATIVE UPPER ENDOSCOPY;  Surgeon: Avel Peace, MD;  Location: [facility];  Service: General;  Laterality: N/A;   IR ANGIOGRAM PELVIS SELECTIVE OR SUPRASELECTIVE  10/20/2021   IR RADIOLOGIST EVAL & MGMT  10/01/2021   IR RADIOLOGIST EVAL & MGMT  11/25/2021   IR RADIOLOGIST EVAL & MGMT  11/04/2022   IR TIB-PERO ART ATHEREC INC PTA MOD SED  10/15/2021   IR US GUIDE VASC ACCESS RIGHT  10/15/2021   TOTAL HIP ARTHROPLASTY     right pin then replacement   UPPER GASTROINTESTINAL ENDOSCOPY      Allergies  Allergen Reactions   Lovastatin Rash    Prior to Admission medications   Medication Sig Start Date  End Date Taking? Authorizing Provider  acetaminophen (TYLENOL) 500 MG tablet Take 1,000 mg by mouth every 6 (six) hours as needed for moderate pain.    [provider]  Alcohol Swabs (B-D SINGLE USE SWABS REGULAR) PADS  12/10/19   [provider]  amLODipine (NORVASC) 10 MG tablet Take 10 mg by mouth every evening.    [provider]  aspirin 81 MG chewable tablet Chew 81 mg by mouth daily.    [provider]  Blood Glucose Monitoring Suppl (TRUE METRIX AIR GLUCOSE METER) w/Device KIT  11/30/19   [provider]  cilostazol (PLETAL) 50 MG tablet Take 1 tablet (50 mg total) by mouth 2 (two) times daily. 11/25/22   Yates Decamp, MD  CINNAMON PO Take 1 capsule by mouth daily. With chromium    [provider]  empagliflozin (JARDIANCE) 10 MG TABS tablet Take 10 mg by mouth daily.     [provider]  ezetimibe (ZETIA) 10 MG tablet Take 1 tablet (10 mg total) by mouth every evening. 11/25/22 11/20/23  Yates Decamp, MD  gabapentin (NEURONTIN) 300 MG capsule Take 300 mg by mouth at bedtime.    [provider]  ibuprofen (ADVIL) 200 MG tablet Take 400 mg by mouth daily as needed for moderate pain.    [provider]  insulin aspart (NOVOLOG) 100 UNIT/ML FlexPen Inject 9 Units into the skin 3 (three) times daily as needed for high blood sugar.    [provider]  Insulin Syringe-Needle U-100 31G X 5/16" 0.5 ML MISC Use one syringe twice daily DX:  250.70 09/26/12   [provider]  KLOR-CON M20 20 MEQ tablet Take 20 mEq by mouth daily. 09/06/17   [provider]  LANTUS SOLOSTAR 100 UNIT/ML Solostar Pen Inject 17 Units into the skin daily. 10/24/19   [provider]  Liniments (BLUE-EMU SUPER STRENGTH) CREA Apply 1 application. topically 3 (three) times daily.    [provider]  losartan-hydrochlorothiazide (HYZAAR) 50-12.5 MG tablet Take 1 tablet by mouth every morning.    [provider]  omeprazole (PRILOSEC) 20 MG capsule Take 20 mg by mouth daily as needed (acid reflux).    [provider]  rosuvastatin (CRESTOR) 20 MG tablet TAKE 1 TABLET EVERY DAY (DISCONTINUE PRAVASTATIN) 07/22/23   Yates Decamp, MD  terbinafine (LAMISIL) 250 MG tablet 1 tablet Orally Once a day for 90 days for toenail fungus 09/29/22   [provider]  TRUE METRIX BLOOD GLUCOSE TEST test strip  11/30/19   [provider]  TRUEplus Lancets 33G MISC  11/30/19   [provider]    Social History   Socioeconomic History   Marital status: Married    Spouse name: Not on file   Number of children: 8   Years of education: Not on file   Highest education level: Not on file  Occupational History   Not on file  Tobacco Use   Smoking status: Never   Smokeless tobacco: Never  Vaping Use   Vaping status:  Never Used  Substance and Sexual Activity   Alcohol use: No   Drug use: No   Sexual activity: Yes  Other Topics Concern   Not on file  Social History Narrative   Patient is married and retired   No alcohol tobacco or drug use   Social Drivers of Corporate investment banker Strain: Not on file  Food Insecurity: Not on file  Transportation Needs: Not on file  Physical Activity: Not on file  Stress: Not on file  Social Connections: Not on file  Intimate Partner Violence: Not on file     Family History  Problem Relation Age of Onset   Diabetes Mother        died at 52 from Covid-19   Hypertension Mother    Hyperlipidemia Mother    Breast cancer Mother    Diabetes Father    Hypertension Father    Hypertension Sister    Hypertension Sister    Colon polyps Daughter    Colon cancer Neg Hx    Esophageal cancer Neg Hx    Rectal cancer Neg Hx    Stomach cancer Neg Hx     ROS: [x]  Positive   [ ]  Negative   [ ]  All sytems reviewed and are negative  Cardiac: []  chest pain/pressure []  hx MI []  SOB   Vascular: []  pain in legs while walking [x]  pain in legs at rest []  pain in legs at night []  non-healing ulcers []  hx of DVT []  swelling in legs  Pulmonary: []  asthma/wheezing []  home O2  Neurologic: []  hx of CVA []  mini stroke   Hematologic: []  hx of cancer  Endocrine:   []  diabetes []  thyroid disease  GI []  GERD  GU: []  CKD/renal failure []  HD--[]  M/W/F or []  T/T/S  Psychiatric: []  anxiety []  depression  Musculoskeletal: []  arthritis []  joint pain  Integumentary: []  rashes []  ulcers  Constitutional: []  fever  []  chills  Physical Examination  Vitals:   11/15/23 1528  BP: (!) 156/47  Pulse: 100  Resp: 16  Temp: 99.1 F (37.3 C)  SpO2: 97%   Body mass index is 29.6 kg/m.  General:  WDWN in NAD Gait: Not observed HENT: WNL, normocephalic Pulmonary: normal non-labored breathing Cardiac: regular Abdomen:  soft, NT Skin: without  rashes Vascular Exam/Pulses: Palpable femoral pulses bilaterally Brisk right popliteal doppler signal Unable to obtain doppler flow right foot Brisk left DP doppler signal Extremities: motor in tact right foot as she is able to wiggle her toes and flex her ankle; sensory decreased to about the level of the ankle on the right  Musculoskeletal: no muscle wasting or atrophy  Neurologic: A&O X 3 Psychiatric:  The pt has Normal affect.   CBC    Component Value Date/Time   WBC 7.4 10/15/2021 0740   RBC 4.10 10/15/2021 0740   HGB 11.6 (L) 10/15/2021 0740   HCT 35.9 (L) 10/15/2021 0740   PLT 181 10/15/2021 0740   MCV 87.6 10/15/2021 0740   MCH 28.3 10/15/2021 0740   MCHC 32.3 10/15/2021 0740   RDW 13.6 10/15/2021 0740   LYMPHSABS 2,385 08/21/2021 1357   MONOABS 0.9 12/31/2020 1729   EOSABS 247 08/21/2021 1357   BASOSABS 48 08/21/2021 1357    BMET    Component Value Date/Time   NA 140 10/15/2021 0740   NA 143 03/26/2019 1403   K 5.0 10/15/2021 0740   CL 105 10/15/2021 0740   CO2 26 10/15/2021 0740   GLUCOSE 122 (H) 10/15/2021 0740   BUN 25 (H) 10/15/2021 0740   BUN 11 03/26/2019 1403   CREATININE 1.13 (H) 10/15/2021 0740   CALCIUM 9.2 10/15/2021 0740   GFRNONAA 51 (L) 10/15/2021 0740   GFRAA 56 (L) 03/26/2019 1403    COAGS: Lab Results  Component Value Date   INR 1.1 10/15/2021   INR 3.1 (H) 09/26/2007   INR 2.3 (H) 09/25/2007     Non-Invasive Vascular  Imaging:   +-------+-----------+-----------+------------+------------+  ABI/TBIToday's ABIToday's TBIPrevious ABIPrevious TBI  +-------+-----------+-----------+------------+------------+  Right 0.07       0          0.62        0             +-------+-----------+-----------+------------+------------+  Left  0.70       0          0.70        0             +-------+-----------+-----------+------------+------------+     ASSESSMENT/PLAN: This is a 78 y.o. female with acute on chronic right limb  ischemia with hx of intervention left AT in 2023 with IR.    -pt without doppler flow right foot but does have palpable femoral bilaterally and has a brisk right popliteal doppler signal.  Her motor is in tact and she has some decreased sensory of the right foot to level of ankle that has been going on for a few days.   -plan for admission to hospital medicine  -would start heparin per pharmacy -CTA a/p with runoff ordered for this evening -plan for angiography tomorrow pending CTA results.  Will go ahead and put in npo order for tomorrow.   -pt seen with Dr. Susi Eric.  He will review CTA and discuss results with pt.   Maryanna Smart, PA-C [facility] [phone]    VASCULAR STAFF ADDENDUM: I have independently interviewed and examined the patient. I agree with the above.  78 year old female with acute on chronic, [place] 2A limb ischemia. She has had classic rest pain for a few weeks but started to develop some numbness over the weekend.  She has a multiphasic brisk DP signal on the left and a multiphasic brisk popliteal on the right. She is motor intact without any deficit. I explained that at the very least she would need an angiogram which will be tentatively planned for tomorrow.  But we will first follow-up the CT scan that is being done tonight.  Please start heparin drip. N.p.o. midnight. Will review CTA and discuss possible intervention with the patient tomorrow morning.  Philipp Brawn MD [facility] Phone Number: [phone] 11/15/2023 6:40 PM

## 2023-11-16 ENCOUNTER — Encounter (HOSPITAL_COMMUNITY)
Admission: EM | Disposition: A | Discharge status: Home Health | Payer: Self-pay | Source: Home / Self Care | Attending: Internal Medicine

## 2023-11-16 DIAGNOSIS — I998 Other disorder of circulatory system: Secondary | ICD-10-CM

## 2023-11-16 DIAGNOSIS — I70221 Atherosclerosis of native arteries of extremities with rest pain, right leg: Secondary | ICD-10-CM | POA: Diagnosis not present

## 2023-11-16 LAB — CBC
HCT: 36.5 % (ref 36.0–46.0)
HCT: 40.3 % (ref 36.0–46.0)
HCT: 31.3 % — ABNORMAL LOW (ref 36.0–46.0)
Hemoglobin: 11.7 g/dL — ABNORMAL LOW (ref 12.0–15.0)
Hemoglobin: 13.1 g/dL (ref 12.0–15.0)
Hemoglobin: 9.9 g/dL — ABNORMAL LOW (ref 12.0–15.0)
MCH: 27.5 pg (ref 26.0–34.0)
MCH: 27.4 pg (ref 26.0–34.0)
MCH: 27.4 pg (ref 26.0–34.0)
MCHC: 32.1 g/dL (ref 30.0–36.0)
MCHC: 32.5 g/dL (ref 30.0–36.0)
MCHC: 31.6 g/dL (ref 30.0–36.0)
MCV: 85.9 fL (ref 80.0–100.0)
MCV: 84.3 fL (ref 80.0–100.0)
MCV: 86.7 fL (ref 80.0–100.0)
Platelets: 160 10*3/uL (ref 150–400)
Platelets: 188 10*3/uL (ref 150–400)
Platelets: 141 10*3/uL — ABNORMAL LOW (ref 150–400)
RBC: 4.25 MIL/uL (ref 3.87–5.11)
RBC: 4.78 MIL/uL (ref 3.87–5.11)
RBC: 3.61 MIL/uL — ABNORMAL LOW (ref 3.87–5.11)
RDW: 13.6 % (ref 11.5–15.5)
RDW: 13.4 % (ref 11.5–15.5)
RDW: 13.7 % (ref 11.5–15.5)
WBC: 6.4 10*3/uL (ref 4.0–10.5)
WBC: 5.6 10*3/uL (ref 4.0–10.5)
WBC: 5.1 10*3/uL (ref 4.0–10.5)
nRBC: 0 % (ref 0.0–0.2)
nRBC: 0 % (ref 0.0–0.2)
nRBC: 0 % (ref 0.0–0.2)

## 2023-11-16 LAB — CBG MONITORING, ED: Glucose-Capillary: 164 mg/dL — ABNORMAL HIGH (ref 70–99)

## 2023-11-16 LAB — BASIC METABOLIC PANEL WITH GFR
Anion gap: 11 (ref 5–15)
BUN: 20 mg/dL (ref 8–23)
CO2: 24 mmol/L (ref 22–32)
Calcium: 9.3 mg/dL (ref 8.9–10.3)
Chloride: 104 mmol/L (ref 98–111)
Creatinine, Ser: 1.03 mg/dL — ABNORMAL HIGH (ref 0.44–1.00)
GFR, Estimated: 56 mL/min — ABNORMAL LOW (ref >60)
Glucose, Bld: 132 mg/dL — ABNORMAL HIGH (ref 70–99)
Potassium: 3.8 mmol/L (ref 3.5–5.1)
Sodium: 139 mmol/L (ref 135–145)

## 2023-11-16 LAB — HEPARIN LEVEL (UNFRACTIONATED)
Heparin Unfractionated: 0.93 [IU]/mL — ABNORMAL HIGH (ref 0.30–0.70)
Heparin Unfractionated: 0.75 [IU]/mL — ABNORMAL HIGH (ref 0.30–0.70)
Heparin Unfractionated: 0.17 [IU]/mL — ABNORMAL LOW (ref 0.30–0.70)

## 2023-11-16 LAB — HEMOGLOBIN A1C
Hgb A1c MFr Bld: 9.8 % — ABNORMAL HIGH (ref 4.8–5.6)
Mean Plasma Glucose: 234.56 mg/dL

## 2023-11-16 LAB — MRSA NEXT GEN BY PCR, NASAL: MRSA by PCR Next Gen: NOT DETECTED

## 2023-11-16 LAB — FIBRINOGEN
Fibrinogen: 458 mg/dL (ref 210–475)
Fibrinogen: 457 mg/dL (ref 210–475)

## 2023-11-16 LAB — GLUCOSE, CAPILLARY
Glucose-Capillary: 123 mg/dL — ABNORMAL HIGH (ref 70–99)
Glucose-Capillary: 179 mg/dL — ABNORMAL HIGH (ref 70–99)
Glucose-Capillary: 323 mg/dL — ABNORMAL HIGH (ref 70–99)

## 2023-11-16 SURGERY — LOWER EXTREMITY ANGIOGRAPHY
Anesthesia: LOCAL | Laterality: Right

## 2023-11-16 MED ORDER — FENTANYL CITRATE (PF) 100 MCG/2ML IJ SOLN
INTRAMUSCULAR | Status: DC | PRN
  Administered 2023-11-16: 50 ug via INTRAVENOUS

## 2023-11-16 MED ORDER — IODIXANOL 320 MG/ML IV SOLN
INTRAVENOUS | Status: DC | PRN
  Administered 2023-11-16: 42 mL

## 2023-11-16 MED ORDER — MIDAZOLAM HCL 2 MG/2ML IJ SOLN
INTRAMUSCULAR | Status: AC
Start: 2023-11-16 — End: ?
  Filled 2023-11-16: qty 2

## 2023-11-16 MED ORDER — HYDRALAZINE HCL 20 MG/ML IJ SOLN
INTRAMUSCULAR | Status: DC | PRN
  Administered 2023-11-16: 20 mg via INTRAVENOUS

## 2023-11-16 MED ORDER — FENTANYL CITRATE (PF) 100 MCG/2ML IJ SOLN
INTRAMUSCULAR | Status: AC
  Filled 2023-11-16: qty 2

## 2023-11-16 MED ORDER — LIDOCAINE HCL (PF) 1 % IJ SOLN
INTRAMUSCULAR | Status: AC
  Filled 2023-11-16: qty 30

## 2023-11-16 MED ORDER — MORPHINE SULFATE (PF) 2 MG/ML IV SOLN: 5.0000 mg | INTRAVENOUS | Status: DC | PRN

## 2023-11-16 MED ORDER — MIDAZOLAM HCL 2 MG/2ML IJ SOLN
INTRAMUSCULAR | Status: DC | PRN
  Administered 2023-11-16: 1 mg via INTRAVENOUS

## 2023-11-16 MED ORDER — SODIUM CHLORIDE 0.9 % IV SOLN: 250.0000 mL | INTRAVENOUS | Status: DC | PRN

## 2023-11-16 MED ORDER — MIDAZOLAM HCL 2 MG/2ML IJ SOLN: 1.0000 mg | INTRAMUSCULAR | Status: DC | PRN

## 2023-11-16 MED ORDER — HEPARIN (PORCINE) 25000 UT/250ML-% IV SOLN
850.0000 [IU]/h | INTRAVENOUS | Status: DC
  Administered 2023-11-16: 500 [IU]/h via INTRAVENOUS
  Filled 2023-11-16: qty 250

## 2023-11-16 MED ORDER — HYDRALAZINE HCL 20 MG/ML IJ SOLN
INTRAMUSCULAR | Status: AC
  Filled 2023-11-16: qty 1

## 2023-11-16 MED ORDER — HEPARIN SODIUM (PORCINE) 1000 UNIT/ML IJ SOLN
INTRAMUSCULAR | Status: DC | PRN
  Administered 2023-11-16: 5000 [IU] via INTRAVENOUS

## 2023-11-16 MED ORDER — LABETALOL HCL 5 MG/ML IV SOLN
INTRAVENOUS | Status: AC
  Filled 2023-11-16: qty 4

## 2023-11-16 MED ORDER — LABETALOL HCL 5 MG/ML IV SOLN
INTRAVENOUS | Status: DC | PRN
  Administered 2023-11-16: 10 mg via INTRAVENOUS

## 2023-11-16 MED ORDER — CHLORHEXIDINE GLUCONATE CLOTH 2 % EX PADS
6.0000 | MEDICATED_PAD | Freq: Every day | CUTANEOUS | Status: DC
  Administered 2023-11-16 – 2023-11-22 (×7): 6 via TOPICAL

## 2023-11-16 MED ORDER — HEPARIN SODIUM (PORCINE) 1000 UNIT/ML IJ SOLN
INTRAMUSCULAR | Status: AC
  Filled 2023-11-16: qty 10

## 2023-11-16 MED ORDER — SODIUM CHLORIDE 0.9% FLUSH: 3.0000 mL | INTRAVENOUS | Status: DC | PRN

## 2023-11-16 MED ORDER — SODIUM CHLORIDE 0.9 % IV SOLN
1.0000 mg/h | INTRAVENOUS | Status: DC
  Administered 2023-11-16 – 2023-11-17 (×2): 1 mg/h
  Filled 2023-11-16 (×2): qty 24

## 2023-11-16 MED ORDER — SODIUM CHLORIDE 0.9 % IV SOLN: INTRAVENOUS | Status: DC

## 2023-11-16 MED ORDER — SODIUM CHLORIDE 0.9 % IV SOLN
INTRAVENOUS | Status: AC | PRN
  Administered 2023-11-16: 10 mL/h via INTRAVENOUS

## 2023-11-16 MED ORDER — HEPARIN (PORCINE) IN NACL 1000-0.9 UT/500ML-% IV SOLN
INTRAVENOUS | Status: DC | PRN
  Administered 2023-11-16 (×2): 500 mL

## 2023-11-16 MED ORDER — ORAL CARE MOUTH RINSE: 15.0000 mL | OROMUCOSAL | Status: DC | PRN

## 2023-11-16 MED ORDER — LIDOCAINE HCL (PF) 1 % IJ SOLN
INTRAMUSCULAR | Status: DC | PRN
  Administered 2023-11-16: 10 mL

## 2023-11-16 MED ORDER — SODIUM CHLORIDE 0.9% FLUSH
3.0000 mL | Freq: Two times a day (BID) | INTRAVENOUS | Status: DC
  Administered 2023-11-16 – 2023-11-17 (×2): 3 mL via INTRAVENOUS

## 2023-11-16 SURGICAL SUPPLY — 11 items
CATH EKOS ULTRASOUND 135X30 (CATHETERS) IMPLANT
CATH OMNI FLUSH 5F 65CM (CATHETERS) IMPLANT
CATH QUICKCROSS .035X135CM (MICROCATHETER) IMPLANT
GLIDEWIRE ADV .035X260CM (WIRE) IMPLANT
KIT MICROPUNCTURE NIT STIFF (SHEATH) IMPLANT
SET ATX-X65L (MISCELLANEOUS) IMPLANT
SHEATH CATAPULT 6F 45 MP (SHEATH) IMPLANT
SHEATH PINNACLE 5F 10CM (SHEATH) IMPLANT
SHEATH PROBE COVER 6X72 (BAG) IMPLANT
TRAY PV CATH (CUSTOM PROCEDURE TRAY) ×3 IMPLANT
WIRE BENTSON .035X145CM (WIRE) IMPLANT

## 2023-11-16 NOTE — Progress Notes (Signed)
  Daily Progress Note  Subjective: Left foot feels better this morning, sitting in chair  Objective: Vitals:   11/16/23 0700 11/16/23 0825  BP: (!) 157/87   Pulse: 65   Resp: 16   Temp:  97.9 F (36.6 C)  SpO2: 100%     Physical Examination No acute distress Hemodynamically stable Motor intact bilaterally  ASSESSMENT/PLAN:  78 year old female with acute on chronic [place] 2A ischemia of the right leg. CTA demonstrates acute on chronic thrombosis of the right popliteal with reconstitution of what appears to be the AT although evaluation is limited of the tibial vessels. Offered angiogram today in the Cath Lab with my partner Dr. Rosalva Comber.  Risks and benefits reviewed.  She expressed understanding and elected to proceed. We also discussed the possibility of lysis initiation and a takeback tomorrow.  She denies any history of her recent diagnosis of cancer.  She denies any recent falls or significant bleeding events.  Plan for Cath Lab today with angiogram possible lysis with Dr. Rosalva Comber Keep n.p.o. Continue heparin drip, pause on rolled to Cath Lab Consent ordered   Philipp Brawn MD MS [facility] [phone] 11/16/2023  8:29 AM

## 2023-11-16 NOTE — Progress Notes (Signed)
 PHARMACY - ANTICOAGULATION CONSULT NOTE  Pharmacy Consult for heparin Indication: leg ischemia  Allergies  Allergen Reactions   Lovastatin Itching and Rash   Nitrofurantoin Rash    Patient Measurements: Height: 5\' 7"  (170.2 cm) Weight: 85.7 kg (189 lb) IBW/kg (Calculated) : 61.6 HEPARIN DW (KG): 79.6  Vital Signs: Temp: 97.9 F (36.6 C) (04/16 0825) Temp Source: Oral (04/16 0825) BP: 126/69 (04/16 1306) Pulse Rate: 76 (04/16 1359)  Labs: Recent Labs    11/15/23 1546 11/15/23 1657 11/16/23 0613  HGB 9.6* 11.9* 11.7*  HCT 30.2* 35.0* 36.5  PLT 164  --  160  APTT 28  --   --   LABPROT 15.5*  --   --   INR 1.2  --   --   HEPARINUNFRC  --   --  0.93*  CREATININE 1.29* 1.40* 1.03*    Estimated Creatinine Clearance: 52.2 mL/min (A) (by C-G formula based on SCr of 1.03 mg/dL (H)).   Medical History: Past Medical History:  Diagnosis Date   Achalasia - Type II 05/08/2010   Qualifier: Diagnosis of  By: Leone Payor MD, Alfonse Ras E    Acute kidney injury (HCC) 02/26/2019   Candida esophagitis (HCC) 03/26/2014   Cataract    COVID-19 virus detected 02/25/2019   Diabetes mellitus    type 2   Family history of adverse reaction to anesthesia    son hard to wake up   GERD (gastroesophageal reflux disease)    Hyperlipidemia    Hypertension    Primary hypertension 02/25/2019   Syncope 02/25/2019   Trigger ring finger of right hand 10/07/2016    Medications:  Medications Prior to Admission  Medication Sig Dispense Refill Last Dose/Taking   acetaminophen (TYLENOL) 500 MG tablet Take 1,000 mg by mouth every 6 (six) hours as needed for moderate pain.   Past Month   amLODipine (NORVASC) 10 MG tablet Take 10 mg by mouth daily.   11/15/2023   aspirin 81 MG chewable tablet Chew 81 mg by mouth daily.   11/15/2023   cilostazol (PLETAL) 50 MG tablet Take 1 tablet (50 mg total) by mouth 2 (two) times daily. 180 tablet 3 11/15/2023   doxycycline (VIBRAMYCIN) 100 MG capsule Take 100 mg  by mouth 2 (two) times daily.   11/15/2023   ezetimibe (ZETIA) 10 MG tablet Take 1 tablet (10 mg total) by mouth every evening. 90 tablet 3 11/14/2023   ibuprofen (ADVIL) 200 MG tablet Take 400 mg by mouth daily as needed for moderate pain.   Past Month   insulin aspart (NOVOLOG) 100 UNIT/ML FlexPen Inject 9 Units into the skin 3 (three) times daily as needed for high blood sugar.   Past Week   KLOR-CON M20 20 MEQ tablet Take 20 mEq by mouth daily.  0 11/15/2023   losartan-hydrochlorothiazide (HYZAAR) 50-12.5 MG tablet Take 1 tablet by mouth every morning.   11/15/2023   omeprazole (PRILOSEC) 20 MG capsule Take 20 mg by mouth daily as needed (acid reflux).   11/15/2023   rosuvastatin (CRESTOR) 20 MG tablet TAKE 1 TABLET EVERY DAY (DISCONTINUE PRAVASTATIN) 90 tablet 0 11/15/2023   Alcohol Swabs (B-D SINGLE USE SWABS REGULAR) PADS       Blood Glucose Monitoring Suppl (TRUE METRIX AIR GLUCOSE METER) w/Device KIT       Insulin Syringe-Needle U-100 31G X 5/16" 0.5 ML MISC Use one syringe twice daily DX:  250.70      TRUE METRIX BLOOD GLUCOSE TEST test strip  TRUEplus Lancets 33G MISC       Scheduled:   Assessment: 27 YOF presented with numbness in the right foot, found to have acute on chronic limb ischemia. PMH significant for HLD and PAD. Patient not on anticoagulation prior to hospitalization. Pharmacy consulted for heparin dosing.   Underwent SFA, popliteal artery pharmacomechanical thrombolysis 4/16. Hgb 11.7, plt 160. No s/sx of bleeding. Currently on alteplase running at 1 mg/hr w/ EKOS. Heparin is running at 500 units/hr. Of note received 5000 units during procedure.   Goal of Therapy:  Heparin level 0.2-0.5 units/ml Monitor platelets by anticoagulation protocol: Yes   Plan:  Continue heparin infusion at 500 units/hr  Monitor anti-Xa level in 6 hours Monitor CBC, fibrinogen, HL and s/sx of bleeding daily  F/u VVS plans - plan for OR on 4/17  Thank you for allowing pharmacy to  participate in this patient's care,  Nieves Bars, PharmD, BCCCP Clinical Pharmacist  Phone: [phone] 11/16/2023 2:24 PM  Please check AMION for all [facility] Pharmacy phone numbers After 10:00 PM, call Main Pharmacy [phone]

## 2023-11-16 NOTE — ED Notes (Signed)
 Pt clothes placed in "patient belonging" bag at bedside

## 2023-11-16 NOTE — Progress Notes (Signed)
 [facility] Progress Note Patient: Catherine Ramsey DOB: 09/27/1945 DOA: 11/15/2023  DOS: the patient was seen and examined on 11/16/2023  Brief Hospital Course: Catherine Ramsey is a 78 y.o. female with medical history significant for PVD s/p LLE ATA laser atherectomy and balloon angioplasty (10/15/2021), T2DM, CKD stage IIIb, HTN, HLD who is admitted with acute on chronic critical limb ischemia of the right lower extremity.  Vascular surgery following and planning for thrombectomy. Assessment and Plan: Acute on chronic critical limb ischemia right lower extremity History of intervention to the left anterior tibial artery 2023 by IR: CTA runoff study shows acute occlusive thrombus of the right popliteal artery behind the knee, moderate to severe stenosis above the knee. - Vascular surgery following, plan for Endo versus open thrombectomy today - Started on IV heparin   Normocytic anemia: Hemoglobin 9.6 on admit, was 11.62 years ago.  Patient has not seen any obvious bleeding.  Monitor closely while on IV heparin.   Type 2 diabetes: Hold home meds and placed on SSI. Hemoglobin A1c 9.8.  Will require better diabetes control on discharge   CKD stage IIIb: Renal function stable, continue to monitor. At risk for worsening after angiography.  Monitor closely.   Hypertension: Continue amlodipine,  Hold losartan, HCTZ.   Hyperlipidemia: Postoperatively resume rosuvastatin and Zetia.   Subjective: Pain well-controlled.  No nausea no vomiting no fever no chills no chest pain.  Physical Exam: General: in Mild distress, No Rash Cardiovascular: S1 and S2 Present, No Murmur Respiratory: Good respiratory effort, Bilateral Air entry present. No Crackles, No wheezes Abdomen: Bowel Sound present, No tenderness Extremities: No edema Neuro: Alert and oriented x3, no new focal deficit  Data Reviewed: I have Reviewed nursing notes, Vitals, and Lab results. Since last encounter,  pertinent lab results CBC and BMP   . I have ordered test including CBC and BMP  .  Disposition: Status is: Inpatient Remains inpatient appropriate because: Needing IV heparin as well as angiography   Family Communication: Husband at bedside Level of care: Telemetry Medical   Vitals:   11/16/23 0300 11/16/23 0700 11/16/23 0825 11/16/23 1055  BP: (!) 169/84 (!) 157/87  (!) 132/93  Pulse: (!) 59 65  60  Resp: 10 16    Temp: 98.7 F (37.1 C)  97.9 F (36.6 C)   TempSrc: Oral  Oral   SpO2: 99% 100%  100%  Weight:      Height:         Author: Charlean Congress, MD 11/16/2023 10:55 AM  Please look on [email or web address] to find out who is on call.

## 2023-11-16 NOTE — Op Note (Signed)
 Patient name: Catherine Ramsey MRN: [id number] DOB: [birth year] Sex: female  11/16/2023 Pre-operative Diagnosis: Acute on chronic [place] 2A right lower extremity ischemia Post-operative diagnosis:  Same Surgeon:  Kayla Part, MD Procedure Performed: 1.  Ultrasound-guided micropuncture access of the left common femoral artery in retrograde fashion 2.  Aortogram 3.  Second-order cannulation, left lower extremity angiogram 4.  Initiation of SFA, popliteal artery pharmacomechanical thrombolysis 5.  Contrast volume 45, moderate sedation time 35 minutes  Indications: Patient is a 78 year old female with known peripheral arterial disease with prior left sided intervention with radiology several years ago.  She presents with acute on chronic to a limb ischemia involving the right lower extremity.  This is improved with the use of heparin.  Imaging demonstrates acute thrombus in the popliteal artery.  After discussing the risks and benefits of diagnostic angiography with possible intervention, possible lytic catheter placement, and it is elected to proceed.  Findings:  Aortogram: Bilateral renal arteries patent.  No flow-limiting stenosis in the aortoiliac segments bilaterally.    On the right: Widely patent common femoral artery, profunda, superficial femoral artery.  The P2 segment of the popliteal artery is occluded with reconstitution distally in the P3 segment of the below-knee popliteal artery.  The anterior tibial artery is patent initially, ending in collaterals.  There is significant disease.  The tibioperoneal trunk is occluded.  The peroneal artery reconstitutes roughly 10 cm from the occlusion providing outflow to the foot via medial and lateral perforators.  Flow in the foot could not be assessed due to the poor flow.   Procedure:  The patient was identified in the holding area and taken to room 8.  The patient was then placed supine on the table and prepped and draped in the usual  sterile fashion.  A time out was called.  Ultrasound was used to evaluate the left common femoral artery.  It was patent .  A digital ultrasound image was acquired.  A micropuncture needle was used to access the left common femoral artery under ultrasound guidance.  An 018 wire was advanced without resistance and a micropuncture sheath was placed.  The 018 wire was removed and a benson wire was placed.  The micropuncture sheath was exchanged for a 5 french sheath.  An omniflush catheter was advanced over the wire to the level of L-1.  An abdominal angiogram was obtained.  Next, using the omniflush catheter and a benson wire, the aortic bifurcation was crossed and the catheter was placed into theright external iliac artery and right runoff was obtained.    I elected to attempt intervention.  The patient was heparinized and a 6 x 45 cm sheath was placed in the proximal superficial femoral artery.  Next, a series of wires and catheters were used to cross the popliteal artery lesion.  The wire sailed.  Clot was acute.  Difficulty came in the below-knee popliteal artery.  I was able to cannulate the anterior tibial artery, however this ended in collaterals at the mid tibia.  I brought the wire proximal and tried to recannulate the peroneal artery, however was gentle, as I did not want to become extraluminal and lose the possibility of lysis due to perforation.  I elected to leave an EKOS pharmacomechanical lysis catheter extending from the below-knee popliteal artery into the superficial femoral artery.  A 30 cm EKOS catheter was brought onto the field and inserted in standard fashion, followed by the ultrasound component. tPA will run at 1  mg/h, heparin will be titrated according to the order set.   Plan will be return to OR tomorrow.  n.p.o. midnight.    Kayla Part MD [facility] Office: [phone]

## 2023-11-16 NOTE — Progress Notes (Signed)
 PHARMACY - ANTICOAGULATION CONSULT NOTE  Pharmacy Consult for heparin Indication: leg ischemia  Allergies  Allergen Reactions   Lovastatin Itching and Rash   Nitrofurantoin Rash    Patient Measurements: Height: 5\' 7"  (170.2 cm) Weight: 85.7 kg (189 lb) IBW/kg (Calculated) : 61.6 HEPARIN DW (KG): 79.6  Vital Signs: Temp: 97.9 F (36.6 C) (04/16 2000) Temp Source: Axillary (04/16 2000) BP: 101/79 (04/16 2100) Pulse Rate: 68 (04/16 2100)  Labs: Recent Labs    11/15/23 1546 11/15/23 1657 11/16/23 0613 11/16/23 1348 11/16/23 2009  HGB 9.6* 11.9* 11.7* 13.1 9.9*  HCT 30.2* 35.0* 36.5 40.3 31.3*  PLT 164  --  160 188 141*  APTT 28  --   --   --   --   LABPROT 15.5*  --   --   --   --   INR 1.2  --   --   --   --   HEPARINUNFRC  --   --  0.93* 0.75* 0.17*  CREATININE 1.29* 1.40* 1.03*  --   --     Estimated Creatinine Clearance: 52.2 mL/min (A) (by C-G formula based on SCr of 1.03 mg/dL (H)).  Scheduled:   Assessment: 86 YOF presented with numbness in the right foot, found to have acute on chronic limb ischemia. PMH significant for HLD and PAD. Patient not on anticoagulation prior to hospitalization. Pharmacy consulted for heparin dosing.   Underwent SFA, popliteal artery pharmacomechanical thrombolysis 4/16. Hgb 11.7, plt 160. No s/sx of bleeding. Currently on alteplase running at 1 mg/hr w/ EKOS. Heparin is running at 500 units/hr. Pt received heparin 5000 units in cath lab at 1213.  Heparin level 0.17 units/ml (slightly subtherapeutic) on infusion at 500 units/hr. No issues with line or bleeding reported per RN. Hgb down to 9.9 (of note, was 9.6 on 4/15. Plt 141.  Goal of Therapy:  Heparin level 0.2-0.5 units/ml Monitor platelets by anticoagulation protocol: Yes   Plan:  Increase heparin infusion to 600 units/hr  Monitor anti-Xa level in 6 hours with CBC and fibrinogen F/u VVS plans - plan for OR on 4/17  Thank you for allowing pharmacy to participate in this  patient's care,  Enrigue Harvard, PharmD, BCPS Please see [email or web address] for complete clinical pharmacist phone list 11/16/2023 9:11 PM

## 2023-11-16 NOTE — Plan of Care (Signed)

## 2023-11-16 NOTE — Progress Notes (Signed)
 PHARMACY - ANTICOAGULATION CONSULT NOTE  Pharmacy Consult for heparin Indication:  ischemic limb  Labs: Recent Labs    11/15/23 1546 11/15/23 1657 11/16/23 0613  HGB 9.6* 11.9* 11.7*  HCT 30.2* 35.0* 36.5  PLT 164  --  160  APTT 28  --   --   LABPROT 15.5*  --   --   INR 1.2  --   --   HEPARINUNFRC  --   --  0.93*  CREATININE 1.29* 1.40* 1.03*   Assessment: 78yo female supratherapeutic on heparin with initial dosing for ischemic leg.  Goal of Therapy:  Heparin level 0.3-0.7 units/ml   Plan:  Decrease heparin infusion by 2-3 units/kg/hr to 1000 units/hr. Check level in 8 hours.   Lonnie Roberts, PharmD, BCPS 11/16/2023 6:51 AM

## 2023-11-16 NOTE — Progress Notes (Signed)
 Orthopedic Tech Progress Note Patient Details:  Catherine Ramsey [birth year] [id number]  Ortho Devices Type of Ortho Device: Knee Immobilizer Ortho Device/Splint Location: LLE Ortho Device/Splint Interventions: Ordered, Application, Adjustment   Post Interventions Patient Tolerated: Well Instructions Provided: Care of device  Kermitt Pedlar 11/16/2023, 2:41 PM

## 2023-11-17 ENCOUNTER — Telehealth (HOSPITAL_COMMUNITY): Payer: Self-pay | Admitting: Pharmacy Technician

## 2023-11-17 ENCOUNTER — Encounter (HOSPITAL_COMMUNITY): Payer: Self-pay | Admitting: Vascular Surgery

## 2023-11-17 ENCOUNTER — Other Ambulatory Visit (HOSPITAL_COMMUNITY): Payer: Self-pay

## 2023-11-17 ENCOUNTER — Encounter (HOSPITAL_COMMUNITY)
Admission: EM | Disposition: A | Discharge status: Home Health | Payer: Self-pay | Source: Home / Self Care | Attending: Internal Medicine

## 2023-11-17 DIAGNOSIS — I70221 Atherosclerosis of native arteries of extremities with rest pain, right leg: Secondary | ICD-10-CM | POA: Diagnosis not present

## 2023-11-17 LAB — CBC
HCT: 32.2 % — ABNORMAL LOW (ref 36.0–46.0)
HCT: 34 % — ABNORMAL LOW (ref 36.0–46.0)
HCT: 35.4 % — ABNORMAL LOW (ref 36.0–46.0)
Hemoglobin: 10.5 g/dL — ABNORMAL LOW (ref 12.0–15.0)
Hemoglobin: 11.1 g/dL — ABNORMAL LOW (ref 12.0–15.0)
Hemoglobin: 11.3 g/dL — ABNORMAL LOW (ref 12.0–15.0)
MCH: 27.9 pg (ref 26.0–34.0)
MCH: 27.5 pg (ref 26.0–34.0)
MCH: 27.6 pg (ref 26.0–34.0)
MCHC: 32.6 g/dL (ref 30.0–36.0)
MCHC: 32.6 g/dL (ref 30.0–36.0)
MCHC: 31.9 g/dL (ref 30.0–36.0)
MCV: 85.6 fL (ref 80.0–100.0)
MCV: 84.4 fL (ref 80.0–100.0)
MCV: 86.6 fL (ref 80.0–100.0)
Platelets: 174 10*3/uL (ref 150–400)
Platelets: 164 10*3/uL (ref 150–400)
Platelets: 144 10*3/uL — ABNORMAL LOW (ref 150–400)
RBC: 3.76 MIL/uL — ABNORMAL LOW (ref 3.87–5.11)
RBC: 4.03 MIL/uL (ref 3.87–5.11)
RBC: 4.09 MIL/uL (ref 3.87–5.11)
RDW: 13.7 % (ref 11.5–15.5)
RDW: 13.8 % (ref 11.5–15.5)
RDW: 13.8 % (ref 11.5–15.5)
WBC: 5.5 10*3/uL (ref 4.0–10.5)
WBC: 6.6 10*3/uL (ref 4.0–10.5)
WBC: 7.3 10*3/uL (ref 4.0–10.5)
nRBC: 0 % (ref 0.0–0.2)
nRBC: 0 % (ref 0.0–0.2)
nRBC: 0 % (ref 0.0–0.2)

## 2023-11-17 LAB — BASIC METABOLIC PANEL WITH GFR
Anion gap: 9 (ref 5–15)
BUN: 24 mg/dL — ABNORMAL HIGH (ref 8–23)
CO2: 22 mmol/L (ref 22–32)
Calcium: 8.9 mg/dL (ref 8.9–10.3)
Chloride: 106 mmol/L (ref 98–111)
Creatinine, Ser: 1.14 mg/dL — ABNORMAL HIGH (ref 0.44–1.00)
GFR, Estimated: 50 mL/min — ABNORMAL LOW (ref >60)
Glucose, Bld: 127 mg/dL — ABNORMAL HIGH (ref 70–99)
Potassium: 4.1 mmol/L (ref 3.5–5.1)
Sodium: 137 mmol/L (ref 135–145)

## 2023-11-17 LAB — FIBRINOGEN
Fibrinogen: 669 mg/dL — ABNORMAL HIGH (ref 210–475)
Fibrinogen: 456 mg/dL (ref 210–475)

## 2023-11-17 LAB — HEPARIN LEVEL (UNFRACTIONATED)
Heparin Unfractionated: <0.1 [IU]/mL — ABNORMAL LOW (ref 0.30–0.70)
Heparin Unfractionated: <0.1 [IU]/mL — ABNORMAL LOW (ref 0.30–0.70)

## 2023-11-17 LAB — GLUCOSE, CAPILLARY
Glucose-Capillary: 132 mg/dL — ABNORMAL HIGH (ref 70–99)
Glucose-Capillary: 137 mg/dL — ABNORMAL HIGH (ref 70–99)
Glucose-Capillary: 116 mg/dL — ABNORMAL HIGH (ref 70–99)
Glucose-Capillary: 224 mg/dL — ABNORMAL HIGH (ref 70–99)

## 2023-11-17 SURGERY — PERIPHERAL VASCULAR THROMBECTOMY
Anesthesia: LOCAL

## 2023-11-17 MED ORDER — SODIUM CHLORIDE 0.9% FLUSH: 3.0000 mL | INTRAVENOUS | Status: DC | PRN

## 2023-11-17 MED ORDER — ACETAMINOPHEN 325 MG PO TABS: 650.0000 mg | ORAL_TABLET | ORAL | Status: DC | PRN

## 2023-11-17 MED ORDER — HEPARIN (PORCINE) IN NACL 1000-0.9 UT/500ML-% IV SOLN
INTRAVENOUS | Status: DC | PRN
  Administered 2023-11-17: 500 mL

## 2023-11-17 MED ORDER — HEPARIN SODIUM (PORCINE) 1000 UNIT/ML IJ SOLN
INTRAMUSCULAR | Status: DC | PRN
  Administered 2023-11-17: 4000 [IU] via INTRAVENOUS

## 2023-11-17 MED ORDER — IODIXANOL 320 MG/ML IV SOLN
INTRAVENOUS | Status: DC | PRN
  Administered 2023-11-17: 55 mL

## 2023-11-17 MED ORDER — HYDRALAZINE HCL 20 MG/ML IJ SOLN: 5.0000 mg | INTRAMUSCULAR | Status: DC | PRN

## 2023-11-17 MED ORDER — HEPARIN (PORCINE) 25000 UT/250ML-% IV SOLN
1550.0000 [IU]/h | INTRAVENOUS | Status: DC
  Administered 2023-11-18: 850 [IU]/h via INTRAVENOUS
  Administered 2023-11-19: 1150 [IU]/h via INTRAVENOUS
  Administered 2023-11-19 – 2023-11-21 (×3): 1700 [IU]/h via INTRAVENOUS
  Administered 2023-11-21: 1550 [IU]/h via INTRAVENOUS
  Filled 2023-11-17 (×6): qty 250

## 2023-11-17 MED ORDER — SODIUM CHLORIDE 0.9 % IV SOLN: INTRAVENOUS | Status: AC

## 2023-11-17 MED ORDER — SODIUM CHLORIDE 0.9 % IV SOLN: 250.0000 mL | INTRAVENOUS | Status: AC | PRN

## 2023-11-17 MED ORDER — HEPARIN SODIUM (PORCINE) 1000 UNIT/ML IJ SOLN
INTRAMUSCULAR | Status: AC
  Filled 2023-11-17: qty 10

## 2023-11-17 MED ORDER — HYDROMORPHONE HCL 1 MG/ML IJ SOLN
0.5000 mg | Freq: Once | INTRAMUSCULAR | Status: AC
  Administered 2023-11-17: 0.5 mg via INTRAVENOUS
  Filled 2023-11-17: qty 0.5

## 2023-11-17 MED ORDER — LABETALOL HCL 5 MG/ML IV SOLN
10.0000 mg | INTRAVENOUS | Status: DC | PRN
  Administered 2023-11-17: 10 mg via INTRAVENOUS
  Filled 2023-11-17: qty 4

## 2023-11-17 MED ORDER — SODIUM CHLORIDE 0.9% FLUSH
3.0000 mL | Freq: Two times a day (BID) | INTRAVENOUS | Status: DC
  Administered 2023-11-17 – 2023-11-28 (×14): 3 mL via INTRAVENOUS

## 2023-11-17 SURGICAL SUPPLY — 6 items
CATH QUICKCROSS .018X135CM (MICROCATHETER) IMPLANT
DEVICE CLOSURE MYNXGRIP 6/7F (Vascular Products) IMPLANT
SHEATH PINNACLE 6F 10CM (SHEATH) IMPLANT
TRAY PV CATH (CUSTOM PROCEDURE TRAY) IMPLANT
WIRE BENTSON .035X145CM (WIRE) IMPLANT
WIRE G V18X300CM (WIRE) IMPLANT

## 2023-11-17 NOTE — Progress Notes (Signed)
 PHARMACY - ANTICOAGULATION CONSULT NOTE  Pharmacy Consult for heparin Indication: leg ischemia  Allergies  Allergen Reactions   Lovastatin Itching and Rash   Nitrofurantoin Rash    Patient Measurements: Height: 5\' 7"  (170.2 cm) Weight: 85.7 kg (189 lb) IBW/kg (Calculated) : 61.6 HEPARIN DW (KG): 79.6  Vital Signs: Temp: 99.5 F (37.5 C) (04/17 0015) Temp Source: Oral (04/17 0015) BP: 159/83 (04/17 0229) Pulse Rate: 77 (04/17 0229)  Labs: Recent Labs    11/15/23 1546 11/15/23 1546 11/15/23 1657 11/16/23 0613 11/16/23 1348 11/16/23 2009 11/17/23 0102  HGB 9.6*  --  11.9* 11.7* 13.1 9.9* 10.5*  HCT 30.2*  --  35.0* 36.5 40.3 31.3* 32.2*  PLT 164  --   --  160 188 141* 174  APTT 28  --   --   --   --   --   --   LABPROT 15.5*  --   --   --   --   --   --   INR 1.2  --   --   --   --   --   --   HEPARINUNFRC  --    < >  --  0.93* 0.75* 0.17* <0.10*  CREATININE 1.29*  --  1.40* 1.03*  --   --   --    < > = values in this interval not displayed.    Estimated Creatinine Clearance: 52.2 mL/min (A) (by C-G formula based on SCr of 1.03 mg/dL (H)).  Scheduled:   Assessment: 34 YOF presented with numbness in the right foot, found to have acute on chronic limb ischemia. PMH significant for HLD and PAD. Patient not on anticoagulation prior to hospitalization. Pharmacy consulted for heparin dosing.   Underwent SFA, popliteal artery pharmacomechanical thrombolysis 4/16. Hgb 11.7, plt 160. No s/sx of bleeding. Currently on alteplase running at 1 mg/hr w/ EKOS. Heparin is running at 500 units/hr. Pt received heparin 5000 units in cath lab at 1213.  Heparin level 0.17 units/ml (slightly subtherapeutic) on infusion at 500 units/hr. No issues with line or bleeding reported per RN. Hgb down to 9.9 (of note, was 9.6 on 4/15. Plt 141.  4/17 AM update:  Heparin level sub-therapeutic   Goal of Therapy:  Heparin level 0.2-0.5 units/ml Monitor platelets by anticoagulation protocol:  Yes   Plan:  Increase heparin infusion to 750 units/hr  Monitor anti-Xa level in 6 hours with CBC and fibrinogen F/u VVS plans - plan for OR on 4/17  Silvestre Drum, PharmD, BCPS Clinical Pharmacist Phone: [phone]

## 2023-11-17 NOTE — Progress Notes (Signed)
 [facility] Progress Note Patient: Catherine Ramsey ONG:[id number] DOB: 25-Dec-1945 DOA: 11/15/2023  DOS: the patient was seen and examined on 11/17/2023  Brief Hospital Course: Catherine Ramsey is a 78 y.o. female with medical history significant for PVD s/p LLE ATA laser atherectomy and balloon angioplasty (10/15/2021), T2DM, CKD stage IIIb, HTN, HLD who is admitted with acute on chronic critical limb ischemia of the right lower extremity. Vascular surgery was consulted. 4/16 underwent angiography and tPA therapy. 4/17 repeat angiogram, unsuccessful recanalization of right TP trunk.  Assessment and Plan: Acute on chronic critical limb ischemia right lower extremity History of intervention to the left anterior tibial artery 2023 by IR: CTA runoff study shows acute occlusive thrombus of the right popliteal artery behind the knee, moderate to severe stenosis above the knee. Vascular surgery was consulted. As above underwent angiography on 4/16 with plan for tPA therapy for below popliteal stenosis. Unfortunately on 4/17 occlusion appears to be chronic below the popliteal and therefore plan would be to consider right peroneal bypass. Patient will be back on heparin 8 hours post sheath removal. Management per vascular surgery.    Normocytic anemia: Baseline hemoglobin appears to be around 11. Hemoglobin fluctuating significantly but currently stable at around 11. Monitor.   Type 2 diabetes melitis uncontrolled with hyperglycemia without long-term insulin use. Hold home meds and placed on SSI. Hemoglobin A1c 9.8.  Will require better diabetes control on discharge   CKD stage IIIb: Renal function stable, continue to monitor. At risk for worsening after angiography.  Monitor closely.   Hypertension: Continue amlodipine,  Hold losartan, HCTZ.   Hyperlipidemia: Postoperatively resume rosuvastatin and Zetia.   Subjective: No nausea no vomiting no fever no chills no chest  pain.  Physical Exam: General: in Mild distress, No Rash Cardiovascular: S1 and S2 Present, No Murmur Respiratory: Good respiratory effort, Bilateral Air entry present. No Crackles, No wheezes Abdomen: Bowel Sound present, No tenderness Extremities: No edema Neuro: Alert and oriented x3, no new focal deficit  Data Reviewed: I have Reviewed nursing notes, Vitals, and Lab results. Since last encounter, pertinent lab results CBC and BMP   . I have ordered test including CBC and BMP  .   Disposition: Status is: Inpatient Remains inpatient appropriate because: Monitor for improvement in pain control  Family Communication: Family at bedside Level of care: Progressive Cardiac   Vitals:   11/17/23 1745 11/17/23 1800 11/17/23 1815 11/17/23 1830  BP:      Pulse: 80 70 74 76  Resp: 10 13 (!) 9 (!) 9  Temp:      TempSrc:      SpO2: 96% 98% 97% 96%  Weight:      Height:         Author: Charlean Congress, MD 11/17/2023 7:19 PM  Please look on [email or web address] to find out who is on call.

## 2023-11-17 NOTE — Progress Notes (Addendum)
 PHARMACY - ANTICOAGULATION CONSULT NOTE  Pharmacy Consult for heparin Indication: leg ischemia  Allergies  Allergen Reactions   Lovastatin Itching and Rash   Nitrofurantoin Rash    Patient Measurements: Height: 5\' 7"  (170.2 cm) Weight: 85.7 kg (189 lb) IBW/kg (Calculated) : 61.6 HEPARIN DW (KG): 79.6  Vital Signs: Temp: 98.7 F (37.1 C) (04/17 0730) Temp Source: Oral (04/17 0730) BP: 125/76 (04/17 0941) Pulse Rate: 71 (04/17 0630)  Labs: Recent Labs    11/15/23 1546 11/15/23 1657 11/16/23 0613 11/16/23 1348 11/16/23 2009 11/17/23 0102 11/17/23 0503 11/17/23 0849  HGB 9.6* 11.9* 11.7*   < > 9.9* 10.5* 11.1*  --   HCT 30.2* 35.0* 36.5   < > 31.3* 32.2* 34.0*  --   PLT 164  --  160   < > 141* 174 164  --   APTT 28  --   --   --   --   --   --   --   LABPROT 15.5*  --   --   --   --   --   --   --   INR 1.2  --   --   --   --   --   --   --   HEPARINUNFRC  --   --  0.93*   < > 0.17* <0.10*  --  <0.10*  CREATININE 1.29* 1.40* 1.03*  --   --   --  1.14*  --    < > = values in this interval not displayed.    Estimated Creatinine Clearance: 47.2 mL/min (A) (by C-G formula based on SCr of 1.14 mg/dL (H)).  Scheduled:   Assessment: 20 YOF presented with numbness in the right foot, found to have acute on chronic limb ischemia. PMH significant for HLD and PAD. Patient not on anticoagulation prior to hospitalization. Pharmacy consulted for heparin dosing.   Underwent SFA, popliteal artery pharmacomechanical thrombolysis 4/16. Hgb 11.7, plt 160. No s/sx of bleeding. Currently on alteplase running at 1 mg/hr w/ EKOS.   Heparin level this morning came back undetectable (<0.1), on 750 units/hr. Hgb 11.1, plt 164. Fibrinogen 458>669.   Goal of Therapy:  Heparin level 0.2-0.5 units/ml Monitor platelets by anticoagulation protocol: Yes   Plan:  Increase heparin infusion to 850 units/hr  Monitor anti-Xa level in 6 hours with CBC and fibrinogen >> will f/u after OR today  (scheduled for 1430)   Thank you for allowing pharmacy to participate in this patient's care,  Nieves Bars, PharmD, BCCCP Clinical Pharmacist  Phone: [phone] 11/17/2023 10:26 AM  Please check AMION for all [facility] Pharmacy phone numbers After 10:00 PM, call Main Pharmacy [phone]

## 2023-11-17 NOTE — Op Note (Signed)
    Patient name: Catherine Ramsey MRN: [id number] DOB: 1945-11-11 Sex: female  11/17/2023 Pre-operative Diagnosis: Acute on chronic limb ischemia right lower extremity with rest pain Post-operative diagnosis:  Same Surgeon:  Young Hensen, MD Procedure Performed: 1.  Thrombolytics catheter check right lower extremity including right lower extremity angiogram 2.  Unsuccessful antegrade recanalization of right TP trunk/proximal peroneal occlusion including catheter selection of the TP trunk 3.  Mynx closure of the left common femoral artery  Indications: 78 year old female seen with acute on chronic limb ischemia of the right lower extremity.  She underwent initiation of lysis yesterday.  She presents for thrombolytics catheter check.  Findings:   Initial angiogram showed widely patent common femoral, profunda, SFA in the right lower extremity.  The above-knee popliteal occlusion was now patent with a residual high-grade stenosis in the above-knee popliteal artery.  Continues to have occluded trifurcation with TP trunk occlusion and occlusion of the proximal peroneal and only distal reconstitution of a peroneal through collaterals.  I got a wire and catheter into the TP trunk but could not cross the occluded segment antegrade as this was all chronic and the wire wanted to track out collaterals.   Procedure:  The patient was identified in the holding area and taken to room 8.  The patient was then placed supine on the table and prepped and draped in the usual sterile fashion.  A time out was called.  Ultimately the EKOS catheter was removed from the right lower extremity after we put a V18 wire into the popliteal artery on the right through the EKOS catheter.  I did give 4000 units of additional IV heparin.  We checked an ACT.  Right lower extremity angiogram was then obtained with hand-injection with pertinent findings noted above.  Continues to have a severe tibial disease with an occluded TP  trunk and proximal peroneal artery with only peroneal runoff.  Ultimately I got a V18 wire with a quick cross catheter into the TP trunk.  I tried to come antegrade and cross the occluded segment.  This was very chronic appearing and calcified.  The wire wanted to track out collaterals.  She still has her peroneal target for bypass.  I did not want to risk losing this.  Wires and catheters were removed.  A mynx closure was placed in the left groin after a short 6 French sheath was placed.  Plan: Will resume heparin in 8 hours post sheath removal.  If no improvement in symptoms needs evaluation for right leg peroneal bypass.  Young Hensen, MD [facility] Office: [phone]

## 2023-11-17 NOTE — Plan of Care (Signed)
  Problem: Education: Goal: Knowledge of General Education information will improve Description: Including pain rating scale, medication(s)/side effects and non-pharmacologic comfort measures Outcome: Progressing   Problem: Health Behavior/Discharge Planning: Goal: Ability to manage health-related needs will improve Outcome: Progressing   Problem: Clinical Measurements: Goal: Ability to maintain clinical measurements within normal limits will improve Outcome: Progressing Goal: Will remain free from infection Outcome: Progressing Goal: Diagnostic test results will improve Outcome: Progressing Goal: Respiratory complications will improve Outcome: Progressing Goal: Cardiovascular complication will be avoided Outcome: Progressing   Problem: Activity: Goal: Risk for activity intolerance will decrease Outcome: Progressing   Problem: Nutrition: Goal: Adequate nutrition will be maintained Outcome: Progressing   Problem: Coping: Goal: Level of anxiety will decrease Outcome: Progressing   Problem: Elimination: Goal: Will not experience complications related to bowel motility Outcome: Progressing Goal: Will not experience complications related to urinary retention Outcome: Progressing   Problem: Pain Managment: Goal: General experience of comfort will improve and/or be controlled Outcome: Progressing   Problem: Safety: Goal: Ability to remain free from injury will improve Outcome: Progressing   Problem: Skin Integrity: Goal: Risk for impaired skin integrity will decrease Outcome: Progressing   Problem: Education: Goal: Ability to describe self-care measures that may prevent or decrease complications (Diabetes Survival Skills Education) will improve Outcome: Progressing Goal: Individualized Educational Video(s) Outcome: Progressing   Problem: Coping: Goal: Ability to adjust to condition or change in health will improve Outcome: Progressing   Problem: Fluid  Volume: Goal: Ability to maintain a balanced intake and output will improve Outcome: Progressing   Problem: Health Behavior/Discharge Planning: Goal: Ability to identify and utilize available resources and services will improve Outcome: Progressing Goal: Ability to manage health-related needs will improve Outcome: Progressing   Problem: Metabolic: Goal: Ability to maintain appropriate glucose levels will improve Outcome: Progressing   Problem: Nutritional: Goal: Maintenance of adequate nutrition will improve Outcome: Progressing Goal: Progress toward achieving an optimal weight will improve Outcome: Progressing   Problem: Skin Integrity: Goal: Risk for impaired skin integrity will decrease Outcome: Progressing   Problem: Tissue Perfusion: Goal: Adequacy of tissue perfusion will improve Outcome: Progressing  Dorrene Gaucher, RN

## 2023-11-17 NOTE — Inpatient Diabetes Management (Addendum)
 Inpatient Diabetes Program Recommendations  AACE/ADA: New Consensus Statement on Inpatient Glycemic Control   Target Ranges:  Prepandial:   less than 140 mg/dL      Peak postprandial:   less than 180 mg/dL (1-2 hours)      Critically ill patients:  140 - 180 mg/dL    Latest Reference Range & Units 11/15/23 22:03 11/16/23 08:17 11/16/23 13:48 11/16/23 16:25 11/16/23 21:57  Glucose-Capillary 70 - 99 mg/dL 161 (H) 096 (H) 045 (H) 179 (H) 323 (H)    Latest Reference Range & Units 11/16/23 06:13  Hemoglobin A1C 4.8 - 5.6 % 9.8 (H)   Review of Glycemic Control  Diabetes history: DM2 Outpatient Diabetes medications: Novolog 10 units TID, Lantus 19 units QAM, Jardiance 10 mg daily (not taking; stopped due to UTIs) Current orders for Inpatient glycemic control: Novolog 0-9 units TID with meals, Novolog 0-5 units QHS  Inpatient Diabetes Program Recommendations:    HbgA1C:  A1C 9.8% on 11/16/23 indicating an average glucose of 235 mg/dl over the past 2-3 months.  Outpatient DM: At time of discharge,  please provide Rx for Lantus pens (#[id number]), insulin pen needles (#[id number]), and FreeStyle Libre 3 sensors [phone]).  NOTE: Patient admitted with critical limb ischemia , anemia, and CKD. In reviewing chart noted patient seen Ronney Lion at [facility] on 11/14/23 and per office note patient is prescribed Lantus 19 units daily, Novolog 10-14 units TID with meals, Jardiance 10 mg daily for DM.  Addendum 11/17/23@11 :45-Spoke with patient about diabetes and home regimen for diabetes control. Patient reports being followed by Endocrinologist (which is also her PCP) for diabetes management and currently taking Lantus 19 units QAM, and Novolog 10 units TID with meals (will sometimes take a few more units of Novolog if glucose is elevated) as an outpatient for diabetes control. Inquired about Jardiance and patient states she has some at home but she is no longer taking it due to recurrent UTIs.    Patient reports checking glucose 3 times per day and that it is usually in the 100's mg/dl in the morning and goes up sometimes after eating.  Discussed A1C results (9.8% on 11/16/23) and explained that current A1C indicates an average glucose of 235 mg/dl over the past 2-3 months. Discussed glucose and A1C goals. Discussed importance of checking CBGs and maintaining good CBG control to prevent long-term and short-term complications. Explained how hyperglycemia leads to damage within blood vessels which lead to the common complications seen with uncontrolled diabetes. Stressed to the patient the importance of improving glycemic control to prevent further complications from uncontrolled diabetes. Inquired about using CGM sensor and patient states she was using [employer] but she has not used lately due to some issue with getting them. Informed patient I would ask our outpatient [facility] pharmacy to see if FreeStyle Libre 3 sensors are covered and if they are it would be requested that discharging provider give a prescription for them at discharge. Encouraged patient to follow up with Endocrinologist in the near future for DM management.  Patient verbalized understanding of information discussed and reports no further questions at this time related to diabetes. Outpatient [facility] pharmacy checked and patient's insurance covers FreeStyle [place] 3 sensors and copay is $0.   Thanks, Orlando Penner, RN, MSN, CDCES Diabetes Coordinator Inpatient Diabetes Program [phone] (Team Pager from 8am to 5pm)

## 2023-11-17 NOTE — Telephone Encounter (Signed)
 Patient Product/process development scientist completed.    The patient is insured through [employer]/ [employer]. Patient has Medicare and is not eligible for a copay card, but may be able to apply for patient assistance or Medicare RX Payment Plan (Patient Must reach out to their plan, if eligible for payment plan), if available.    Ran test claim for Freestyle Libre 3 Plus Sensor and the current 30 day co-pay is $0.00.   This test claim was processed through [facility]- copay amounts may vary at other pharmacies due to pharmacy/plan contracts, or as the patient moves through the different stages of their insurance plan.     Morgan Arab, CPHT Pharmacy Technician III Certified Patient Advocate [facility] Patient Advocate Team Direct Number: [phone]  Fax: [phone]

## 2023-11-17 NOTE — Progress Notes (Addendum)
  Progress Note    11/17/2023 7:51 AM 1 Day Post-Op  Subjective:  pain in R foot tolerable.  No events overnight   Vitals:   11/17/23 0630 11/17/23 0730  BP: (!) 150/69   Pulse: 71   Resp: 10   Temp:  98.7 F (37.1 C)  SpO2: 97%    Physical Exam: Lungs:  non labored Incisions:  L groin without hematoma Extremities:  no discernable doppler flow in the R foot or ankle; calf soft Neurologic: A&O  CBC    Component Value Date/Time   WBC 6.6 11/17/2023 0503   RBC 4.03 11/17/2023 0503   HGB 11.1 (L) 11/17/2023 0503   HCT 34.0 (L) 11/17/2023 0503   PLT 164 11/17/2023 0503   MCV 84.4 11/17/2023 0503   MCH 27.5 11/17/2023 0503   MCHC 32.6 11/17/2023 0503   RDW 13.8 11/17/2023 0503   LYMPHSABS 3.2 11/15/2023 1546   MONOABS 0.5 11/15/2023 1546   EOSABS 0.2 11/15/2023 1546   BASOSABS 0.1 11/15/2023 1546    BMET    Component Value Date/Time   NA 137 11/17/2023 0503   NA 143 03/26/2019 1403   K 4.1 11/17/2023 0503   CL 106 11/17/2023 0503   CO2 22 11/17/2023 0503   GLUCOSE 127 (H) 11/17/2023 0503   BUN 24 (H) 11/17/2023 0503   BUN 11 03/26/2019 1403   CREATININE 1.14 (H) 11/17/2023 0503   CALCIUM 8.9 11/17/2023 0503   GFRNONAA 50 (L) 11/17/2023 0503   GFRAA 56 (L) 03/26/2019 1403    INR    Component Value Date/Time   INR 1.2 11/15/2023 1546     Intake/Output Summary (Last 24 hours) at 11/17/2023 0751 Last data filed at 11/17/2023 0600 Gross per 24 hour  Intake 1193.81 ml  Output 800 ml  Net 393.81 ml     Assessment/Plan:  78 y.o. female is s/p angiogram with initiation of thrombolysis 1 Day Post-Op   L groin site without bleeding or hematoma Some sensation in R foot; able to wiggle toes; no discernable doppler flow at the foot or ankle Plan for lysis recheck today with Dr. Fulton Job Continue NPO Consent ordered  Cordie Deters, PA-C [facility] [phone] 11/17/2023 7:51 AM  VASCULAR STAFF ADDENDUM: I have independently  interviewed and examined the patient. I agree with the above.  Less pain than yesterday.  No signal in the foot I had a long discussion with her and her daughter who was bedside regarding the right lower extremity.  I think there is an acute and chronic portion.  I am hoping that we can fix the acute portion with lysis.  The chronic portion may require bypass if she does not have significant improvement.  Short distance claudication prior to rest pain event.  Plan for catheter check today.  With my partner Dr. Fulton Job.  Kayla Part MD [facility] Phone Number: [phone] 11/17/2023 10:31 AM

## 2023-11-17 NOTE — Progress Notes (Signed)
 PHARMACY - ANTICOAGULATION CONSULT NOTE  Pharmacy Consult for heparin Indication: leg ischemia  Allergies  Allergen Reactions   Lovastatin Itching and Rash   Nitrofurantoin Rash    Patient Measurements: Height: 5\' 7"  (170.2 cm) Weight: 85.7 kg (189 lb) IBW/kg (Calculated) : 61.6 HEPARIN DW (KG): 79.6  Vital Signs: Temp: 98.3 F (36.8 C) (04/17 1118) Temp Source: Oral (04/17 1118) BP: 141/65 (04/17 1230) Pulse Rate: 83 (04/17 1230)  Labs: Recent Labs    11/15/23 1546 11/15/23 1657 11/16/23 0613 11/16/23 1348 11/16/23 2009 11/17/23 0102 11/17/23 0503 11/17/23 0849  HGB 9.6* 11.9* 11.7*   < > 9.9* 10.5* 11.1* 11.3*  HCT 30.2* 35.0* 36.5   < > 31.3* 32.2* 34.0* 35.4*  PLT 164  --  160   < > 141* 174 164 144*  APTT 28  --   --   --   --   --   --   --   LABPROT 15.5*  --   --   --   --   --   --   --   INR 1.2  --   --   --   --   --   --   --   HEPARINUNFRC  --   --  0.93*   < > 0.17* <0.10*  --  <0.10*  CREATININE 1.29* 1.40* 1.03*  --   --   --  1.14*  --    < > = values in this interval not displayed.    Estimated Creatinine Clearance: 47.2 mL/min (A) (by C-G formula based on SCr of 1.14 mg/dL (H)).  Scheduled:   Assessment: 28 YOF presented with numbness in the right foot, found to have acute on chronic limb ischemia. PMH significant for HLD and PAD. Patient not on anticoagulation prior to hospitalization. Pharmacy consulted for heparin dosing.   Underwent SFA, popliteal artery pharmacomechanical thrombolysis 4/16 with alteplase, now s/p relook cath. Lysis off, heparin to resume 8h after sheath removal (~1700).    Goal of Therapy:  Heparin level 0.3-0.7 units/ml Monitor platelets by anticoagulation protocol: Yes   Plan:  Resume heparin 850 units/h no bolus at 0100 (4/18) Check heparin level 8h after restart  Ashe Gago, PharmD, BCPS, [facility] Clinical Pharmacist [phone] Please check AMION for all [facility] Pharmacy numbers 11/17/2023

## 2023-11-17 NOTE — Progress Notes (Signed)
   11/17/23 1215  Spiritual Encounters  Type of Visit Initial  Care provided to: Patient  Conversation partners present during encounter Nurse  Reason for visit Advance directives   Chaplain responding to Spiritual Consult for advance care directive. Upon arrival chaplain discovered that Pt was sleeping.  Spoke with RN who also stated that Pt was experiencing episodes of delirium as a result of lack of sleep.  Chaplain assessed that this frame of mind would not be conducive to introducing legal documents. We will wait a few days for Pt to return to baseline and attempt again.  Chaplain services remain available by Spiritual Consult or for emergent cases, paging [phone]  Chaplain Dewitte Foreman, MDiv [email or web address] [email or web address] [phone]

## 2023-11-18 ENCOUNTER — Inpatient Hospital Stay (HOSPITAL_COMMUNITY)

## 2023-11-18 ENCOUNTER — Encounter (HOSPITAL_COMMUNITY): Payer: Self-pay | Admitting: Vascular Surgery

## 2023-11-18 DIAGNOSIS — I70221 Atherosclerosis of native arteries of extremities with rest pain, right leg: Secondary | ICD-10-CM | POA: Diagnosis not present

## 2023-11-18 DIAGNOSIS — Z9889 Other specified postprocedural states: Secondary | ICD-10-CM

## 2023-11-18 DIAGNOSIS — I743 Embolism and thrombosis of arteries of the lower extremities: Secondary | ICD-10-CM

## 2023-11-18 DIAGNOSIS — M62261 Nontraumatic ischemic infarction of muscle, right lower leg: Secondary | ICD-10-CM | POA: Diagnosis not present

## 2023-11-18 LAB — BASIC METABOLIC PANEL WITH GFR
Anion gap: 8 (ref 5–15)
BUN: 23 mg/dL (ref 8–23)
CO2: 23 mmol/L (ref 22–32)
Calcium: 8.9 mg/dL (ref 8.9–10.3)
Chloride: 108 mmol/L (ref 98–111)
Creatinine, Ser: 1.1 mg/dL — ABNORMAL HIGH (ref 0.44–1.00)
GFR, Estimated: 52 mL/min — ABNORMAL LOW (ref >60)
Glucose, Bld: 222 mg/dL — ABNORMAL HIGH (ref 70–99)
Potassium: 4.2 mmol/L (ref 3.5–5.1)
Sodium: 139 mmol/L (ref 135–145)

## 2023-11-18 LAB — CBC
HCT: 31.5 % — ABNORMAL LOW (ref 36.0–46.0)
Hemoglobin: 10.2 g/dL — ABNORMAL LOW (ref 12.0–15.0)
MCH: 27.6 pg (ref 26.0–34.0)
MCHC: 32.4 g/dL (ref 30.0–36.0)
MCV: 85.4 fL (ref 80.0–100.0)
Platelets: 138 10*3/uL — ABNORMAL LOW (ref 150–400)
RBC: 3.69 MIL/uL — ABNORMAL LOW (ref 3.87–5.11)
RDW: 13.7 % (ref 11.5–15.5)
WBC: 7.1 10*3/uL (ref 4.0–10.5)
nRBC: 0 % (ref 0.0–0.2)

## 2023-11-18 LAB — GLUCOSE, CAPILLARY
Glucose-Capillary: 188 mg/dL — ABNORMAL HIGH (ref 70–99)
Glucose-Capillary: 262 mg/dL — ABNORMAL HIGH (ref 70–99)
Glucose-Capillary: 139 mg/dL — ABNORMAL HIGH (ref 70–99)
Glucose-Capillary: 193 mg/dL — ABNORMAL HIGH (ref 70–99)

## 2023-11-18 LAB — LIPID PANEL
Cholesterol: 108 mg/dL (ref 0–200)
HDL: 43 mg/dL (ref >40)
LDL Cholesterol: 52 mg/dL (ref 0–99)
Total CHOL/HDL Ratio: 2.5 ratio
Triglycerides: 64 mg/dL (ref <150)
VLDL: 13 mg/dL (ref 0–40)

## 2023-11-18 LAB — HEPARIN LEVEL (UNFRACTIONATED)
Heparin Unfractionated: <0.1 [IU]/mL — ABNORMAL LOW (ref 0.30–0.70)
Heparin Unfractionated: <0.1 [IU]/mL — ABNORMAL LOW (ref 0.30–0.70)

## 2023-11-18 MED ORDER — ROSUVASTATIN CALCIUM 20 MG PO TABS
20.0000 mg | ORAL_TABLET | Freq: Every day | ORAL | Status: DC
  Administered 2023-11-18 – 2023-11-28 (×10): 20 mg via ORAL
  Filled 2023-11-18 (×10): qty 1

## 2023-11-18 MED ORDER — ASPIRIN 81 MG PO TBEC
81.0000 mg | DELAYED_RELEASE_TABLET | Freq: Every day | ORAL | Status: DC
  Administered 2023-11-19 – 2023-11-28 (×9): 81 mg via ORAL
  Filled 2023-11-18 (×9): qty 1

## 2023-11-18 MED ORDER — EZETIMIBE 10 MG PO TABS
10.0000 mg | ORAL_TABLET | Freq: Every day | ORAL | Status: DC
  Administered 2023-11-18 – 2023-11-28 (×10): 10 mg via ORAL
  Filled 2023-11-18 (×11): qty 1

## 2023-11-18 NOTE — Progress Notes (Signed)
 PHARMACY - ANTICOAGULATION CONSULT NOTE  Pharmacy Consult for heparin  Indication: leg ischemia  Allergies  Allergen Reactions   Lovastatin Itching and Rash   Nitrofurantoin Rash    Patient Measurements: Height: 5\' 7"  (170.2 cm) Weight: 85.7 kg (189 lb) IBW/kg (Calculated) : 61.6 HEPARIN  DW (KG): 79.6  Vital Signs: Temp: 99.5 F (37.5 C) (04/18 1243) Temp Source: Oral (04/18 1243) BP: 138/73 (04/18 0914) Pulse Rate: 94 (04/18 0605)  Labs: Recent Labs    11/15/23 1546 11/15/23 1657 11/16/23 1610 11/16/23 1348 11/17/23 0102 11/17/23 0503 11/17/23 0849 11/18/23 0306 11/18/23 1000  HGB 9.6*   < > 11.7*   < > 10.5* 11.1* 11.3* 10.2*  --   HCT 30.2*   < > 36.5   < > 32.2* 34.0* 35.4* 31.5*  --   PLT 164  --  160   < > 174 164 144* 138*  --   APTT 28  --   --   --   --   --   --   --   --   LABPROT 15.5*  --   --   --   --   --   --   --   --   INR 1.2  --   --   --   --   --   --   --   --   HEPARINUNFRC  --   --  0.93*   < > <0.10*  --  <0.10*  --  <0.10*  CREATININE 1.29*   < > 1.03*  --   --  1.14*  --  1.10*  --    < > = values in this interval not displayed.    Estimated Creatinine Clearance: 48.9 mL/min (A) (by C-G formula based on SCr of 1.1 mg/dL (H)).  Scheduled:   Assessment: 67 YOF presented with numbness in the right foot, found to have acute on chronic limb ischemia. PMH significant for HLD and PAD. Patient not on anticoagulation prior to hospitalization. Pharmacy consulted for heparin  dosing.   Underwent SFA, popliteal artery pharmacomechanical thrombolysis 4/16 with alteplase , now s/p relook cath.  Heparin  restart 8h after sheath removed 4/17. Initial heparin  level undetectable on 850 units/hr.  Hgb 10.2, pltc 138.  Plan to continue heparin  over the weekend in the event patient develops pain / needs bypass next week.    Goal of Therapy:  Heparin  level 0.3-0.7 units/ml Monitor platelets by anticoagulation protocol: Yes   Plan:  Increase heparin  950  units/hr 8 hour heparin  level Daily heparin  level, CBC F/u transition to DOAC pending pain/repeat intervention  Cecillia Cogan, PharmD Clinical Pharmacist 11/18/2023  12:54 PM

## 2023-11-18 NOTE — Inpatient Diabetes Management (Addendum)
 Inpatient Diabetes Program Recommendations  AACE/ADA: New Consensus Statement on Inpatient Glycemic Control  Target Ranges:  Prepandial:   less than 140 mg/dL      Peak postprandial:   less than 180 mg/dL (1-2 hours)      Critically ill patients:  140 - 180 mg/dL    Latest Reference Range & Units 11/17/23 07:26 11/17/23 11:20 11/17/23 17:32 11/17/23 20:59 11/18/23 06:16  Glucose-Capillary 70 - 99 mg/dL 161 (H) 096 (H) 045 (H) 224 (H) 188 (H)   Review of Glycemic Control  Diabetes history: DM2 Outpatient Diabetes medications: Novolog  10 units TID, Lantus  19 units QAM, Jardiance 10 mg daily (not taking; stopped due to UTIs) Current orders for Inpatient glycemic control: Novolog  0-9 units TID with meals, Novolog  0-5 units QHS   Inpatient Diabetes Program Recommendations:     HbgA1C:  A1C 9.8% on 11/16/23 indicating an average glucose of 235 mg/dl over the past 2-3 months.   Outpatient DM: At time of discharge,  please provide Rx for Lantus  pens (#[id number]), insulin  pen needles (#[id number]), and FreeStyle Libre 3 sensors [phone]).   Addendum 11/18/23@10 :45-Spoke with patient at bedside. Informed patient that FreeStyle Libre 3 sensors are covered with $0 copay. Provided patient with FreeStyle Libre 3 sensor samples (2). Reminded patient that be sure she has the [employer] 3 app when she starts using this sensors. Patient verbalized  understanding and is appreciative of help with getting back on CGM sensors. Thanks, Beacher Limerick, RN, MSN, CDCES Diabetes Coordinator Inpatient Diabetes Program [phone] (Team Pager from 8am to 5pm)

## 2023-11-18 NOTE — Progress Notes (Signed)
 PHARMACY - ANTICOAGULATION CONSULT NOTE  Pharmacy Consult for heparin  Indication: leg ischemia  Allergies  Allergen Reactions   Lovastatin Itching and Rash   Nitrofurantoin Rash    Patient Measurements: Height: 5\' 7"  (170.2 cm) Weight: 85.7 kg (189 lb) IBW/kg (Calculated) : 61.6 HEPARIN  DW (KG): 79.6  Vital Signs: Temp: 98.8 F (37.1 C) (04/18 1942) Temp Source: Oral (04/18 1942) BP: 131/62 (04/18 1942) Pulse Rate: 82 (04/18 1942)  Labs: Recent Labs    11/16/23 0613 11/16/23 1348 11/17/23 0503 11/17/23 0849 11/18/23 0306 11/18/23 1000 11/18/23 2133  HGB 11.7*   < > 11.1* 11.3* 10.2*  --   --   HCT 36.5   < > 34.0* 35.4* 31.5*  --   --   PLT 160   < > 164 144* 138*  --   --   HEPARINUNFRC 0.93*   < >  --  <0.10*  --  <0.10* <0.10*  CREATININE 1.03*  --  1.14*  --  1.10*  --   --    < > = values in this interval not displayed.    Estimated Creatinine Clearance: 48.9 mL/min (A) (by C-G formula based on SCr of 1.1 mg/dL (H)).  Scheduled:   Assessment: 11 YOF presented with numbness in the right foot, found to have acute on chronic limb ischemia. PMH significant for HLD and PAD. Patient not on anticoagulation prior to hospitalization. Pharmacy consulted for heparin  dosing.   Underwent SFA, popliteal artery pharmacomechanical thrombolysis 4/16 with alteplase , now s/p relook cath.  Heparin  restart 8h after sheath removed 4/17.  Heparin  level remains <0.1, no infusion issues.  Goal of Therapy:  Heparin  level 0.3-0.7 units/ml Monitor platelets by anticoagulation protocol: Yes   Plan:  Increase heparin  to 1150 units/h Recheck heparin  level in 8h  Levin Reamer, PharmD, [place], [facility] Clinical Pharmacist [phone] Please check AMION for all [facility] Pharmacy numbers 11/18/2023

## 2023-11-18 NOTE — Progress Notes (Addendum)
  Progress Note    11/18/2023 8:24 AM 1 Day Post-Op  Subjective: No significant rest pain in the right foot overnight   Vitals:   11/18/23 0605 11/18/23 0619  BP: (!) 156/89   Pulse: 94   Resp: 16   Temp:  99.7 F (37.6 C)  SpO2: 99%    Physical Exam: Lungs: Nonlabored Incisions: Left groin without hematoma Extremities: Right foot warm with motor and sensation however unable to find a Doppler signal at the foot or ankle; calf is soft Neurologic: A&O  CBC    Component Value Date/Time   WBC 7.1 11/18/2023 0306   RBC 3.69 (L) 11/18/2023 0306   HGB 10.2 (L) 11/18/2023 0306   HCT 31.5 (L) 11/18/2023 0306   PLT 138 (L) 11/18/2023 0306   MCV 85.4 11/18/2023 0306   MCH 27.6 11/18/2023 0306   MCHC 32.4 11/18/2023 0306   RDW 13.7 11/18/2023 0306   LYMPHSABS 3.2 11/15/2023 1546   MONOABS 0.5 11/15/2023 1546   EOSABS 0.2 11/15/2023 1546   BASOSABS 0.1 11/15/2023 1546    BMET    Component Value Date/Time   NA 139 11/18/2023 0306   NA 143 03/26/2019 1403   K 4.2 11/18/2023 0306   CL 108 11/18/2023 0306   CO2 23 11/18/2023 0306   GLUCOSE 222 (H) 11/18/2023 0306   BUN 23 11/18/2023 0306   BUN 11 03/26/2019 1403   CREATININE 1.10 (H) 11/18/2023 0306   CALCIUM  8.9 11/18/2023 0306   GFRNONAA 52 (L) 11/18/2023 0306   GFRAA 56 (L) 03/26/2019 1403    INR    Component Value Date/Time   INR 1.2 11/15/2023 1546     Intake/Output Summary (Last 24 hours) at 11/18/2023 0824 Last data filed at 11/18/2023 0600 Gross per 24 hour  Intake 603.24 ml  Output 700 ml  Net -96.76 ml     Assessment/Plan:  78 y.o. 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  On exam her foot is warm with motor and sensation intact.  I am however unable to find a Doppler signal at the foot or ankle.  Plan will be to ambulate the patient to see  if symptoms are tolerable.  If no improvement in symptoms, she will be evaluated for right leg peroneal bypass.  Okay to transfer to 4e   Cordie Deters, PA-C [facility] [phone] 11/18/2023 8:24 AM   VASCULAR STAFF ADDENDUM: I have independently interviewed and examined the patient. I agree with the above.  Okay to transfer to floor today.  I explained that if her symptoms are improved and she no longer has pain we can plan to transition to a DOAC and have short interval follow-up to evaluate how her right foot is doing and how her symptoms are.  If she is developing rest pain over the weekend we will plan for femoral to peroneal bypass early next week.  Philipp Brawn MD [facility] Phone Number: [phone] 11/18/2023 9:17 AM

## 2023-11-18 NOTE — Progress Notes (Signed)
 [facility] Progress Note Patient: Catherine Ramsey FMW:[id number] DOB: [birth year] DOA: 11/15/2023  DOS: the patient was seen and examined on 11/18/2023  Brief Hospital Course: Catherine Ramsey is a 78 y.o. female with medical history significant for PVD s/p LLE ATA laser atherectomy and balloon angioplasty (10/15/2021), T2DM, CKD stage IIIb, HTN, HLD who is admitted with acute on chronic critical limb ischemia of the right lower extremity. Vascular surgery was consulted. 4/16 underwent angiography and tPA therapy. 4/17 repeat angiogram, unsuccessful recanalization of right TP trunk. 4/18 transferred to floor. Assessment and Plan: Acute on chronic critical limb ischemia right lower extremity History of intervention to the left anterior tibial artery 2023 by IR: CTA runoff study shows acute occlusive thrombus of the right popliteal artery behind the knee, moderate to severe stenosis above the knee. Vascular surgery was consulted. As above underwent angiography on 4/16 with plan for tPA therapy for below popliteal stenosis. Unfortunately on 4/17 occlusion appears to be chronic below the popliteal and therefore plan would be to consider right peroneal bypass. Currently on heparin .  Management per vascular surgery   Normocytic anemia: Baseline hemoglobin appears to be around 11. Hemoglobin fluctuating significantly but currently stable at around 11. Monitor.   Type 2 diabetes melitis uncontrolled with hyperglycemia without long-term insulin  use. Hold home meds and placed on SSI. Hemoglobin A1c 9.8.  Will require better diabetes control on discharge   CKD stage IIIb: Renal function stable, continue to monitor. At risk for worsening after angiography.  Monitor closely.   Hypertension: Continue amlodipine ,  Hold losartan , HCTZ.   Hyperlipidemia: Postoperatively resume rosuvastatin  and Zetia .   Subjective: No nausea no vomiting no fever no chills.  No chest pain.  Rest pain actually  reported better by patient.  Physical Exam: General: in Mild distress, No Rash Cardiovascular: S1 and S2 Present, No Murmur Respiratory: Good respiratory effort, Bilateral Air entry present. No Crackles, No wheezes Abdomen: Bowel Sound present, No tenderness Extremities: No edema Neuro: Alert and oriented x3, no new focal deficit  Data Reviewed: I have Reviewed nursing notes, Vitals, and Lab results. Since last encounter, pertinent lab results CBC and BMP   . I have ordered test including CBC and BMP  .   Disposition: Status is: Inpatient Remains inpatient appropriate because: Monitor for improvement in pain control and circulation   Family Communication: Family at bedside Level of care: Telemetry Cardiac transfer from progressive to telemetry. Vitals:   11/18/23 1305 11/18/23 1400 11/18/23 1510 11/18/23 1651  BP: 121/78  (!) 142/79 (!) 142/65  Pulse: 74 79 75 81  Resp: 20 17 16 20   Temp:   98.1 F (36.7 C) 98.2 F (36.8 C)  TempSrc:   Oral Oral  SpO2: 98% 100% 100% 98%  Weight:      Height:         Author: Yetta Blanch, MD 11/18/2023 6:23 PM  Please look on [email or web address] to find out who is on call.

## 2023-11-18 NOTE — Progress Notes (Signed)
 PHARMACIST LIPID MONITORING   Catherine Ramsey is a 78 y.o. female admitted on 11/15/2023 with leg ischemia s/p thrombolysis.  Pharmacy has been consulted to optimize lipid-lowering therapy with the indication of secondary prevention for clinical ASCVD.  Recent Labs:  Lipid Panel (last 6 months):   Lab Results  Component Value Date   CHOL 108 11/18/2023   TRIG 64 11/18/2023   HDL 43 11/18/2023   CHOLHDL 2.5 11/18/2023   VLDL 13 11/18/2023   LDLCALC 52 11/18/2023    Hepatic function panel (last 6 months):   Lab Results  Component Value Date   AST 17 11/15/2023   ALT 18 11/15/2023   ALKPHOS 70 11/15/2023   BILITOT 0.6 11/15/2023    SCr (since admission):   Serum creatinine: 1.1 mg/dL (H) 16/10/96 0454 Estimated creatinine clearance: 48.9 mL/min (A)  Current therapy and lipid therapy tolerance Current lipid-lowering therapy: rosuvastatin  20 mg, zetia  Previous lipid-lowering therapies (if applicable): pravastatin  20-40 mg Documented or reported allergies or intolerances to lipid-lowering therapies (if applicable): lovastatin (itching, rash)  Assessment:   LDL 52 at goal on PTA zetia  and rosuvastatin .  No changes.  Plan:    1.Statin intensity (high intensity recommended for all patients regardless of the LDL):  No statin changes. The patient is already on a high intensity statin.  2.Add ezetimibe  (if any one of the following):   Continue PTA Zetia   3.Refer to lipid clinic:   No  4.Follow-up with:  Primary care provider - Bertha Broad, MD  5.Follow-up labs after discharge:  No changes in lipid therapy, repeat a lipid panel in one year.     Cecillia Cogan, PharmD Clinical Pharmacist 11/18/2023  10:55 AM

## 2023-11-18 NOTE — Progress Notes (Signed)
 Patient arrived from [facility] via wheelchair. Ao x3. Denies any pain. Breathing even and unlabored in RA. CHG bath completed, connected to tele and CCMD notified. Oriented pt to room and call bell system. Pt's family by bedside. Plan of care continues.

## 2023-11-18 NOTE — Progress Notes (Signed)
 Lower extremity venous mapping duplex completed. Please see CV Procedures for preliminary results.  Estanislao Heimlich, RVT 11/18/23 10:52 AM

## 2023-11-19 DIAGNOSIS — I743 Embolism and thrombosis of arteries of the lower extremities: Secondary | ICD-10-CM

## 2023-11-19 DIAGNOSIS — Z9889 Other specified postprocedural states: Secondary | ICD-10-CM

## 2023-11-19 DIAGNOSIS — I998 Other disorder of circulatory system: Secondary | ICD-10-CM

## 2023-11-19 DIAGNOSIS — I70221 Atherosclerosis of native arteries of extremities with rest pain, right leg: Secondary | ICD-10-CM | POA: Diagnosis not present

## 2023-11-19 LAB — GLUCOSE, CAPILLARY
Glucose-Capillary: 169 mg/dL — ABNORMAL HIGH (ref 70–99)
Glucose-Capillary: 193 mg/dL — ABNORMAL HIGH (ref 70–99)
Glucose-Capillary: 210 mg/dL — ABNORMAL HIGH (ref 70–99)
Glucose-Capillary: 229 mg/dL — ABNORMAL HIGH (ref 70–99)

## 2023-11-19 LAB — CBC
HCT: 30.6 % — ABNORMAL LOW (ref 36.0–46.0)
Hemoglobin: 10 g/dL — ABNORMAL LOW (ref 12.0–15.0)
MCH: 27.8 pg (ref 26.0–34.0)
MCHC: 32.7 g/dL (ref 30.0–36.0)
MCV: 85 fL (ref 80.0–100.0)
Platelets: 131 10*3/uL — ABNORMAL LOW (ref 150–400)
RBC: 3.6 MIL/uL — ABNORMAL LOW (ref 3.87–5.11)
RDW: 13.6 % (ref 11.5–15.5)
WBC: 6.9 10*3/uL (ref 4.0–10.5)
nRBC: 0 % (ref 0.0–0.2)

## 2023-11-19 LAB — BASIC METABOLIC PANEL WITH GFR
Anion gap: 7 (ref 5–15)
BUN: 18 mg/dL (ref 8–23)
CO2: 24 mmol/L (ref 22–32)
Calcium: 8.8 mg/dL — ABNORMAL LOW (ref 8.9–10.3)
Chloride: 107 mmol/L (ref 98–111)
Creatinine, Ser: 1.02 mg/dL — ABNORMAL HIGH (ref 0.44–1.00)
GFR, Estimated: 57 mL/min — ABNORMAL LOW (ref >60)
Glucose, Bld: 148 mg/dL — ABNORMAL HIGH (ref 70–99)
Potassium: 3.8 mmol/L (ref 3.5–5.1)
Sodium: 138 mmol/L (ref 135–145)

## 2023-11-19 LAB — HEPARIN LEVEL (UNFRACTIONATED)
Heparin Unfractionated: <0.1 [IU]/mL — ABNORMAL LOW (ref 0.30–0.70)
Heparin Unfractionated: <0.1 [IU]/mL — ABNORMAL LOW (ref 0.30–0.70)

## 2023-11-19 MED ORDER — SENNOSIDES-DOCUSATE SODIUM 8.6-50 MG PO TABS
2.0000 | ORAL_TABLET | Freq: Two times a day (BID) | ORAL | Status: DC
  Administered 2023-11-19 – 2023-11-28 (×12): 2 via ORAL
  Filled 2023-11-19 (×16): qty 2

## 2023-11-19 MED ORDER — OXYCODONE HCL 5 MG PO TABS
5.0000 mg | ORAL_TABLET | ORAL | Status: DC | PRN
  Administered 2023-11-19 – 2023-11-20 (×3): 5 mg via ORAL
  Filled 2023-11-19 (×3): qty 1

## 2023-11-19 MED ORDER — DIPHENHYDRAMINE HCL 25 MG PO CAPS
25.0000 mg | ORAL_CAPSULE | Freq: Once | ORAL | Status: AC
  Administered 2023-11-19: 25 mg via ORAL
  Filled 2023-11-19: qty 1

## 2023-11-19 MED ORDER — HYDROXYZINE HCL 25 MG PO TABS: 25.0000 mg | ORAL_TABLET | Freq: Three times a day (TID) | ORAL | Status: DC | PRN

## 2023-11-19 MED ORDER — POLYETHYLENE GLYCOL 3350 17 G PO PACK
17.0000 g | PACK | Freq: Every day | ORAL | Status: DC
Start: 2023-11-19 — End: 2023-11-28
  Administered 2023-11-19 – 2023-11-28 (×8): 17 g via ORAL
  Filled 2023-11-19 (×9): qty 1

## 2023-11-19 MED ORDER — METHOCARBAMOL 500 MG PO TABS
500.0000 mg | ORAL_TABLET | Freq: Three times a day (TID) | ORAL | Status: DC | PRN
  Administered 2023-11-19 – 2023-11-20 (×3): 500 mg via ORAL
  Filled 2023-11-19 (×3): qty 1

## 2023-11-19 NOTE — Plan of Care (Signed)

## 2023-11-19 NOTE — Progress Notes (Signed)
 [facility] Progress Note Patient: Catherine Ramsey ZOX:[id number] DOB: [birth year] DOA: 11/15/2023  DOS: the patient was seen and examined on 11/19/2023  Brief Hospital Course: Catherine Ramsey is a 78 y.o. female with medical history significant for PVD s/p LLE ATA laser atherectomy and balloon angioplasty (10/15/2021), T2DM, CKD stage IIIb, HTN, HLD who is admitted with acute on chronic critical limb ischemia of the right lower extremity. Vascular surgery was consulted. 4/16 underwent angiography and tPA therapy. 4/17 repeat angiogram, unsuccessful recanalization of right TP trunk. 4/18 transferred to floor.  Assessment and Plan: Acute on chronic critical limb ischemia right lower extremity History of intervention to the left anterior tibial artery 2023 by IR: CTA runoff study shows acute occlusive thrombus of the right popliteal artery behind the knee, moderate to severe stenosis above the knee. Vascular surgery was consulted. As above underwent angiography on 4/16 with plan for tPA therapy for below popliteal stenosis. Unfortunately on 4/17 occlusion appears to be chronic below the popliteal and therefore plan would be to consider right peroneal bypass. Currently on heparin .  Management per vascular surgery.  Awaiting improvement in rest pain.  Able to walk 400 feet on 4/19.   Normocytic anemia: Baseline hemoglobin appears to be around 11. Hemoglobin fluctuating significantly but currently stable at around 11. Monitor.   Type 2 diabetes melitis uncontrolled with hyperglycemia without long-term insulin  use. Hold home meds and placed on SSI. Hemoglobin A1c 9.8.  Will require better diabetes control on discharge   CKD stage IIIb: Renal function stable, continue to monitor. At risk for worsening after angiography.  Monitor closely.   Hypertension: Continue amlodipine ,  Hold losartan , HCTZ.   Hyperlipidemia: Postoperatively resume rosuvastatin  and Zetia .   Subjective: No  nausea or vomiting.  Reports worsening pain today.  No fever no chills.  No diarrhea.  Physical Exam: General: in Mild distress, No Rash Cardiovascular: S1 and S2 Present, No Murmur Respiratory: Good respiratory effort, Bilateral Air entry present. No Crackles, No wheezes Abdomen: Bowel Sound present, No tenderness Extremities: No edema Neuro: Alert and oriented x3, no new focal deficit  Data Reviewed: I have Reviewed nursing notes, Vitals, and Lab results. Since last encounter, pertinent lab results CBC and BMP   . I have ordered test including CBC and BMP  .   Disposition: Status is: Inpatient Remains inpatient appropriate because: Monitor for improvement in rest pain .   Family Communication: Family at bedside Level of care: Telemetry Cardiac   Vitals:   11/18/23 2316 11/19/23 0342 11/19/23 0803 11/19/23 1613  BP: (!) 138/112 (!) 114/92 123/87 (!) 153/80  Pulse: 85 76 69 70  Resp: 17 20 12 15   Temp: 99.1 F (37.3 C) 99.3 F (37.4 C) (!) 97.3 F (36.3 C) 97.9 F (36.6 C)  TempSrc: Oral Oral Axillary Oral  SpO2: 96% 98% 100% 99%  Weight:      Height:         Author: Charlean Congress, MD 11/19/2023 5:57 PM  Please look on [email or web address] to find out who is on call.

## 2023-11-19 NOTE — Progress Notes (Signed)
 PHARMACY - ANTICOAGULATION CONSULT NOTE  Pharmacy Consult for heparin  Indication: leg ischemia  Allergies  Allergen Reactions   Lovastatin Itching and Rash   Nitrofurantoin Rash    Patient Measurements: Height: 5\' 7"  (170.2 cm) Weight: 85.7 kg (189 lb) IBW/kg (Calculated) : 61.6 HEPARIN  DW (KG): 79.6  Vital Signs: Temp: 97.3 F (36.3 C) (04/19 0803) Temp Source: Axillary (04/19 0803) BP: 123/87 (04/19 0803) Pulse Rate: 69 (04/19 0803)  Labs: Recent Labs    11/17/23 0503 11/17/23 0849 11/18/23 0306 11/18/23 1000 11/18/23 2133 11/19/23 0809  HGB 11.1* 11.3* 10.2*  --   --  10.0*  HCT 34.0* 35.4* 31.5*  --   --  30.6*  PLT 164 144* 138*  --   --  131*  HEPARINUNFRC  --  <0.10*  --  <0.10* <0.10* <0.10*  CREATININE 1.14*  --  1.10*  --   --  1.02*    Estimated Creatinine Clearance: 52.7 mL/min (A) (by C-G formula based on SCr of 1.02 mg/dL (H)).  Scheduled:   Assessment: 27 YOF presented with numbness in the right foot, found to have acute on chronic limb ischemia. PMH significant for HLD and PAD. Patient not on anticoagulation prior to hospitalization. Pharmacy consulted for heparin  dosing.   Underwent SFA, popliteal artery pharmacomechanical thrombolysis 4/16 with alteplase , now s/p relook cath. Heparin  restart 8h after sheath removed 4/17.  This morning, heparin  level remains <0.1 (subtherapetuic) with heparin  @ 1150 units/hour. CBC okay - hgb 10, plts 131. No issues reported.   Goal of Therapy:  Heparin  level 0.3-0.7 units/ml Monitor platelets by anticoagulation protocol: Yes   Plan:  Increase heparin  to 1,400 units/h Recheck heparin  level in 8h Monitor daily CBC and heparin  level  Monitor for signs/symptoms of bleeding  Follow-up switching to DOAC when appropriate   Adaline Ada, PharmD PGY1 Pharmacy Resident 11/19/2023 8:55 AM

## 2023-11-19 NOTE — Progress Notes (Signed)
 [facility] [place] PROGRESS NOTE  ASSESSMENT / PLAN: Catherine Ramsey is a 78 y.o. female status post catheter directed thrombolysis for acute on chronic [place] 2 by right lower extremity ischemia 4/16 and 4/17.  This did improve flow through the femoral and popliteal arteries, but a persistent tibioperoneal trunk occlusion was noted with peroneal only runoff.  At this point the patient has no concerning symptoms for limb threatening ischemia.  She does report some atypical foot discomfort.  I do not think she is walked enough to fully assess her exercise tolerance.  Will try to mobilize her today.  I encouraged her to avoid narcotics is much as possible.  Should limb-threatening symptoms develop, we can consider a femoral peroneal bypass.    SUBJECTIVE: No distress.  Patient reports some occasional episodes of right foot pain.  She does not describe the typical constant severe pain of ischemic rest pain.  She walked a little bit yesterday, but not very far very fast.  She took some pain medicine last night.  OBJECTIVE: BP 123/87 (BP Location: Left Arm)   Pulse 69   Temp (!) 97.3 F (36.3 C) (Axillary)   Resp 12   Ht 5\' 7"  (1.702 m)   Wt 85.7 kg   SpO2 100%   BMI 29.60 kg/m   Intake/Output Summary (Last 24 hours) at 11/19/2023 0915 Last data filed at 11/19/2023 0839 Gross per 24 hour  Intake 472.86 ml  Output 200 ml  Net 272.86 ml    Elderly woman in no distress Regular rate and rhythm Unlabored breathing No Doppler flow in the right ankle or foot Normal motor and sensory function in the right foot     Latest Ref Rng & Units 11/19/2023    8:09 AM 11/18/2023    3:06 AM 11/17/2023    8:49 AM  CBC  WBC 4.0 - 10.5 K/uL 6.9  7.1  7.3   Hemoglobin 12.0 - 15.0 g/dL 56.3  87.5  64.3   Hematocrit 36.0 - 46.0 % 30.6  31.5  35.4   Platelets 150 - 400 K/uL 131  138  144         Latest Ref Rng & Units 11/19/2023    8:09 AM 11/18/2023    3:06 AM 11/17/2023     5:03 AM  CMP  Glucose 70 - 99 mg/dL 329  518  841   BUN 8 - 23 mg/dL 18  23  24    Creatinine 0.44 - 1.00 mg/dL 6.60  6.30  1.60   Sodium 135 - 145 mmol/L 138  139  137   Potassium 3.5 - 5.1 mmol/L 3.8  4.2  4.1   Chloride 98 - 111 mmol/L 107  108  106   CO2 22 - 32 mmol/L 24  23  22    Calcium  8.9 - 10.3 mg/dL 8.8  8.9  8.9     Estimated Creatinine Clearance: 52.7 mL/min (A) (by C-G formula based on SCr of 1.02 mg/dL (H)).  Heber Little. Edgardo Goodwill, MD [facility] Phone Number: [phone] 11/19/2023 9:15 AM

## 2023-11-19 NOTE — Progress Notes (Signed)
 PHARMACY - ANTICOAGULATION CONSULT NOTE  Pharmacy Consult for heparin  Indication: leg ischemia  Allergies  Allergen Reactions   Lovastatin Itching and Rash   Nitrofurantoin Rash    Patient Measurements: Height: 5\' 7"  (170.2 cm) Weight: 85.7 kg (189 lb) IBW/kg (Calculated) : 61.6 HEPARIN  DW (KG): 79.6  Vital Signs: Temp: 97.9 F (36.6 C) (04/19 1613) Temp Source: Oral (04/19 1613) BP: 153/80 (04/19 1613) Pulse Rate: 70 (04/19 1613)  Labs: Recent Labs    11/17/23 0503 11/17/23 0849 11/18/23 0306 11/18/23 1000 11/18/23 2133 11/19/23 0809 11/19/23 1654  HGB 11.1* 11.3* 10.2*  --   --  10.0*  --   HCT 34.0* 35.4* 31.5*  --   --  30.6*  --   PLT 164 144* 138*  --   --  131*  --   HEPARINUNFRC  --  <0.10*  --    < > <0.10* <0.10* <0.10*  CREATININE 1.14*  --  1.10*  --   --  1.02*  --    < > = values in this interval not displayed.    Estimated Creatinine Clearance: 52.7 mL/min (A) (by C-G formula based on SCr of 1.02 mg/dL (H)).  Scheduled:   Assessment: 27 YOF presented with numbness in the right foot, found to have acute on chronic limb ischemia. PMH significant for HLD and PAD. Patient not on anticoagulation prior to hospitalization. Pharmacy consulted for heparin  dosing.   Underwent SFA, popliteal artery pharmacomechanical thrombolysis 4/16 with alteplase , now s/p relook cath. Heparin  restart 8h after sheath removed 4/17.  This morning, heparin  level remains <0.1 (subtherapeutic) with heparin  @ 1150 units/hour. CBC okay - hgb 10, plts 131. No issues reported.   4/19 PM update: HL <0.1 Hgb 10 PLT 131K  No signs of bleeding / issues w/ gtt noted after speaking with nursing  Goal of Therapy:  Heparin  level 0.3-0.7 units/ml Monitor platelets by anticoagulation protocol: Yes   Plan:  Increase heparin  to 1,700 units/h Recheck heparin  level in 8h Monitor daily CBC and heparin  level  Monitor for signs/symptoms of bleeding  Follow-up switching to DOAC when  appropriate   Marleta Simmer BS, PharmD, BCPS Clinical Pharmacist 11/19/2023 5:43 PM  Contact: [phone] after 3 PM  "Be curious, not judgmental..." -Rumalda Counter

## 2023-11-20 DIAGNOSIS — I70221 Atherosclerosis of native arteries of extremities with rest pain, right leg: Secondary | ICD-10-CM | POA: Diagnosis not present

## 2023-11-20 LAB — CBC
HCT: 28.9 % — ABNORMAL LOW (ref 36.0–46.0)
Hemoglobin: 9.4 g/dL — ABNORMAL LOW (ref 12.0–15.0)
MCH: 28 pg (ref 26.0–34.0)
MCHC: 32.5 g/dL (ref 30.0–36.0)
MCV: 86 fL (ref 80.0–100.0)
Platelets: 126 10*3/uL — ABNORMAL LOW (ref 150–400)
RBC: 3.36 MIL/uL — ABNORMAL LOW (ref 3.87–5.11)
RDW: 13.6 % (ref 11.5–15.5)
WBC: 7.7 10*3/uL (ref 4.0–10.5)
nRBC: 0 % (ref 0.0–0.2)

## 2023-11-20 LAB — BASIC METABOLIC PANEL WITH GFR
Anion gap: 9 (ref 5–15)
BUN: 19 mg/dL (ref 8–23)
CO2: 25 mmol/L (ref 22–32)
Calcium: 8.5 mg/dL — ABNORMAL LOW (ref 8.9–10.3)
Chloride: 103 mmol/L (ref 98–111)
Creatinine, Ser: 1.03 mg/dL — ABNORMAL HIGH (ref 0.44–1.00)
GFR, Estimated: 56 mL/min — ABNORMAL LOW (ref >60)
Glucose, Bld: 128 mg/dL — ABNORMAL HIGH (ref 70–99)
Potassium: 3.9 mmol/L (ref 3.5–5.1)
Sodium: 137 mmol/L (ref 135–145)

## 2023-11-20 LAB — GLUCOSE, CAPILLARY
Glucose-Capillary: 126 mg/dL — ABNORMAL HIGH (ref 70–99)
Glucose-Capillary: 217 mg/dL — ABNORMAL HIGH (ref 70–99)
Glucose-Capillary: 261 mg/dL — ABNORMAL HIGH (ref 70–99)
Glucose-Capillary: 279 mg/dL — ABNORMAL HIGH (ref 70–99)
Glucose-Capillary: 175 mg/dL — ABNORMAL HIGH (ref 70–99)

## 2023-11-20 LAB — HEPARIN LEVEL (UNFRACTIONATED)
Heparin Unfractionated: 0.33 [IU]/mL (ref 0.30–0.70)
Heparin Unfractionated: 0.52 [IU]/mL (ref 0.30–0.70)

## 2023-11-20 LAB — MAGNESIUM: Magnesium: 1.8 mg/dL (ref 1.7–2.4)

## 2023-11-20 MED ORDER — METHOCARBAMOL 500 MG PO TABS
500.0000 mg | ORAL_TABLET | Freq: Three times a day (TID) | ORAL | Status: DC
  Administered 2023-11-20 – 2023-11-23 (×7): 500 mg via ORAL
  Filled 2023-11-20 (×7): qty 1

## 2023-11-20 MED ORDER — OXYCODONE HCL 5 MG PO TABS
10.0000 mg | ORAL_TABLET | ORAL | Status: DC | PRN
  Administered 2023-11-21 – 2023-11-28 (×11): 10 mg via ORAL
  Filled 2023-11-20 (×11): qty 2

## 2023-11-20 MED ORDER — HYDROMORPHONE HCL 1 MG/ML IJ SOLN
0.5000 mg | INTRAMUSCULAR | Status: DC | PRN
  Administered 2023-11-20 – 2023-11-28 (×11): 0.5 mg via INTRAVENOUS
  Filled 2023-11-20 (×11): qty 0.5

## 2023-11-20 MED ORDER — DIAZEPAM 2 MG PO TABS: 2.0000 mg | ORAL_TABLET | Freq: Three times a day (TID) | ORAL | Status: DC | PRN

## 2023-11-20 MED ORDER — HYDROMORPHONE HCL 1 MG/ML IJ SOLN
0.5000 mg | INTRAMUSCULAR | Status: DC | PRN
  Administered 2023-11-20: 0.5 mg via INTRAVENOUS
  Filled 2023-11-20: qty 0.5

## 2023-11-20 MED ORDER — OXYCODONE HCL 5 MG PO TABS
5.0000 mg | ORAL_TABLET | ORAL | Status: DC | PRN
  Administered 2023-11-20: 5 mg via ORAL
  Filled 2023-11-20: qty 1

## 2023-11-20 NOTE — Progress Notes (Signed)
 [facility] [place] PROGRESS NOTE  ASSESSMENT / PLAN: Catherine Ramsey is a 78 y.o. female status post catheter directed thrombolysis for acute on chronic [place] 2 by right lower extremity ischemia 4/16 and 4/17.  This did improve flow through the femoral and popliteal arteries, but a persistent tibioperoneal trunk occlusion was noted with peroneal only runoff.  Right foot pain has worsened. I am worried she has early rest pain. Will discuss with Dr. Susi Eric. Anticipate she will need femoral-peroneal bypass.  SUBJECTIVE: No distress.  Patient reports onset of severe pain in R foot beginning early this morning. Pain medicine has not given relief. Discussed this may mean she needs a bypass.  OBJECTIVE: BP 134/74 (BP Location: Left Arm)   Pulse 65   Temp 97.9 F (36.6 C) (Oral)   Resp 11   Ht 5\' 7"  (1.702 m)   Wt 85.7 kg   SpO2 100%   BMI 29.60 kg/m  No intake or output data in the 24 hours ending 11/20/23 1530   Elderly woman in no distress Regular rate and rhythm Unlabored breathing No Doppler flow in the right ankle or foot Normal motor and sensory function in the right foot     Latest Ref Rng & Units 11/20/2023    5:07 AM 11/19/2023    8:09 AM 11/18/2023    3:06 AM  CBC  WBC 4.0 - 10.5 K/uL 7.7  6.9  7.1   Hemoglobin 12.0 - 15.0 g/dL 9.4  16.1  09.6   Hematocrit 36.0 - 46.0 % 28.9  30.6  31.5   Platelets 150 - 400 K/uL 126  131  138         Latest Ref Rng & Units 11/20/2023    5:07 AM 11/19/2023    8:09 AM 11/18/2023    3:06 AM  CMP  Glucose 70 - 99 mg/dL 045  409  811   BUN 8 - 23 mg/dL 19  18  23    Creatinine 0.44 - 1.00 mg/dL 9.14  7.82  9.56   Sodium 135 - 145 mmol/L 137  138  139   Potassium 3.5 - 5.1 mmol/L 3.9  3.8  4.2   Chloride 98 - 111 mmol/L 103  107  108   CO2 22 - 32 mmol/L 25  24  23    Calcium  8.9 - 10.3 mg/dL 8.5  8.8  8.9     Estimated Creatinine Clearance: 52.2 mL/min (A) (by C-G formula based on SCr of 1.03 mg/dL  (H)).  Heber Little. Edgardo Goodwill, MD [facility] Phone Number: [phone] 11/20/2023 3:30 PM

## 2023-11-20 NOTE — Progress Notes (Signed)
 PHARMACY - ANTICOAGULATION CONSULT NOTE  Pharmacy Consult for heparin  Indication: leg ischemia  Allergies  Allergen Reactions   Lovastatin Itching and Rash   Nitrofurantoin Rash    Patient Measurements: Height: 5\' 7"  (170.2 cm) Weight: 85.7 kg (189 lb) IBW/kg (Calculated) : 61.6 HEPARIN  DW (KG): 79.6  Vital Signs: Temp: 97.9 F (36.6 C) (04/20 0921) Temp Source: Oral (04/20 0921) BP: 134/74 (04/20 0921) Pulse Rate: 65 (04/20 0431)  Labs: Recent Labs    11/18/23 0306 11/18/23 1000 11/19/23 0809 11/19/23 1654 11/20/23 0507  HGB 10.2*  --  10.0*  --  9.4*  HCT 31.5*  --  30.6*  --  28.9*  PLT 138*  --  131*  --  126*  HEPARINUNFRC  --    < > <0.10* <0.10* 0.33  CREATININE 1.10*  --  1.02*  --  1.03*   < > = values in this interval not displayed.    Estimated Creatinine Clearance: 52.2 mL/min (A) (by C-G formula based on SCr of 1.03 mg/dL (H)).  Scheduled:   Assessment: 59 YOF presented with numbness in the right foot, found to have acute on chronic limb ischemia. PMH significant for HLD and PAD. Patient not on anticoagulation prior to hospitalization. Pharmacy consulted for heparin  dosing.   Underwent SFA, popliteal artery pharmacomechanical thrombolysis 4/16 with alteplase , now s/p relook cath. Heparin  restart 8h after sheath removed 4/17.  This afternoon, heparin  level 0.52 (therapeutic) with heparin  @ 1,700 units/hour. CBC okay - hgb 9.4, plts 126. No issues reported.   Goal of Therapy:  Heparin  level 0.3-0.7 units/ml Monitor platelets by anticoagulation protocol: Yes   Plan:  Continue heparin  at 1,700 units/h Monitor daily CBC and heparin  level  Monitor for signs/symptoms of bleeding  Follow-up switching to DOAC when appropriate   Adaline Ada, PharmD PGY1 Pharmacy Resident 11/20/2023 2:34 PM

## 2023-11-20 NOTE — Plan of Care (Signed)
  Problem: Education: Goal: Knowledge of General Education information will improve Description: Including pain rating scale, medication(s)/side effects and non-pharmacologic comfort measures Outcome: Progressing   Problem: Clinical Measurements: Goal: Ability to maintain clinical measurements within normal limits will improve Outcome: Progressing Goal: Will remain free from infection Outcome: Progressing Goal: Diagnostic test results will improve Outcome: Progressing Goal: Respiratory complications will improve Outcome: Progressing Goal: Cardiovascular complication will be avoided Outcome: Progressing   Problem: Coping: Goal: Level of anxiety will decrease Outcome: Progressing   Problem: Elimination: Goal: Will not experience complications related to bowel motility Outcome: Progressing Goal: Will not experience complications related to urinary retention Outcome: Progressing   Problem: Pain Managment: Goal: General experience of comfort will improve and/or be controlled Outcome: Progressing   Problem: Safety: Goal: Ability to remain free from injury will improve Outcome: Progressing   Problem: Skin Integrity: Goal: Risk for impaired skin integrity will decrease Outcome: Progressing   Problem: Coping: Goal: Ability to adjust to condition or change in health will improve Outcome: Progressing   Problem: Fluid Volume: Goal: Ability to maintain a balanced intake and output will improve Outcome: Progressing   Problem: Health Behavior/Discharge Planning: Goal: Ability to identify and utilize available resources and services will improve Outcome: Progressing   Problem: Metabolic: Goal: Ability to maintain appropriate glucose levels will improve Outcome: Progressing   Problem: Nutritional: Goal: Maintenance of adequate nutrition will improve Outcome: Progressing   Problem: Skin Integrity: Goal: Risk for impaired skin integrity will decrease Outcome: Progressing    Problem: Tissue Perfusion: Goal: Adequacy of tissue perfusion will improve Outcome: Progressing   Problem: Activity: Goal: Ability to return to baseline activity level will improve Outcome: Progressing   Problem: Cardiovascular: Goal: Ability to achieve and maintain adequate cardiovascular perfusion will improve Outcome: Progressing Goal: Vascular access site(s) Level 0-1 will be maintained Outcome: Progressing   Problem: Health Behavior/Discharge Planning: Goal: Ability to safely manage health-related needs after discharge will improve Outcome: Progressing

## 2023-11-20 NOTE — Progress Notes (Signed)
 [facility] Progress Note Patient: Catherine Ramsey:[id number] DOB: 1945/10/20 DOA: 11/15/2023  DOS: the patient was seen and examined on 11/20/2023  Brief Hospital Course: Catherine Ramsey is a 78 y.o. female with medical history significant for PVD s/p LLE ATA laser atherectomy and balloon angioplasty (10/15/2021), T2DM, CKD stage IIIb, HTN, HLD who is admitted with acute on chronic critical limb ischemia of the right lower extremity. Vascular surgery was consulted. 4/16 underwent angiography and tPA therapy. 4/17 repeat angiogram, unsuccessful recanalization of right TP trunk. 4/18 transferred to floor.  Assessment and Plan: Acute on chronic critical limb ischemia right lower extremity History of intervention to the left anterior tibial artery 2023 by IR: CTA runoff study shows acute occlusive thrombus of the right popliteal artery behind the knee, moderate to severe stenosis above the knee. Vascular surgery was consulted. As above underwent angiography on 4/16 with plan for tPA therapy for below popliteal stenosis. Unfortunately on 4/17 occlusion appears to be chronic below the popliteal and therefore plan would be to consider right peroneal bypass. Currently on heparin .  Management per vascular surgery. Significant restaurant reported on 4/20.  Most likely will require bypass. Discussed with vascular surgery. Medications adjusted for pain.   Normocytic anemia: Baseline hemoglobin appears to be around 11. Hemoglobin fluctuating significantly but currently stable at around 11. Monitor.   Type 2 diabetes melitis uncontrolled with hyperglycemia without long-term insulin  use. Hold home meds and placed on SSI. Hemoglobin A1c 9.8.  Will require better diabetes control on discharge   CKD stage IIIb: Renal function stable, continue to monitor. At risk for worsening after angiography.  Monitor closely.   Hypertension: Continue amlodipine ,  Hold losartan , HCTZ.    Hyperlipidemia: Postoperatively resume rosuvastatin  and Zetia .   Subjective: No nausea no vomiting no fevers or chills.  Has significant rest pain.  Physical Exam: General: in Mild distress, No Rash Cardiovascular: S1 and S2 Present, No Murmur Respiratory: Good respiratory effort, Bilateral Air entry present. No Crackles, No wheezes Abdomen: Bowel Sound present, No tenderness Extremities: No edema Neuro: Alert and oriented x3, no new focal deficit  Data Reviewed: I have Reviewed nursing notes, Vitals, and Lab results. Since last encounter, pertinent lab results CBC and BMP   . I have ordered test including CBC heparin  level and BMP  . I have discussed pt's care plan and test results with vascular surgery  .    Disposition: Status is: Inpatient Remains inpatient appropriate because: Monitor for improvement in pain.   Family Communication: Family at bedside Level of care: Telemetry Cardiac   Vitals:   11/20/23 0013 11/20/23 0431 11/20/23 0921 11/20/23 1709  BP: (!) 149/73 (!) 155/66 134/74 120/64  Pulse: 79 65  82  Resp: 17 11  (!) 24  Temp: 99.4 F (37.4 C) 99.1 F (37.3 C) 97.9 F (36.6 C) 98.2 F (36.8 C)  TempSrc: Oral Oral Oral Oral  SpO2: 99% 100%  100%  Weight:      Height:         Author: Charlean Congress, MD 11/20/2023 7:07 PM  Please look on [email or web address] to find out who is on call.

## 2023-11-20 NOTE — Evaluation (Signed)
 Physical Therapy Evaluation Patient Details Name: Catherine Ramsey MRN: [id number] DOB: Mar 09, 1946 Today's Date: 11/20/2023  History of Present Illness  78 y.o. female presents to [facility] 11/15/23 for R LE pain and numbness. Pt admitted with R LE critical ischemia and moderate LE arterial disease. 4/16 R LE angiogram w/ thrombolysis. 4/17 repeat angiogram w/ unsuccessful recanalization of R TP trunk. PMHx: PVD s/p LLE ATA laser atherectomy and balloon angioplasty (10/15/2021), T2DM, CKD stage IIIb, HTN, HLD, R THA   Clinical Impression  Pt in bed upon arrival with husband present and agreeable to PT eval. PTA, pt was independent for mobility and ADLs with no AD. In today's session, pt was supervision for bed mobility and MinA/CGA to stand with a RW. Pt was able to ambulate 120 ft with RW and CGA. She was then able to ascend/descend 4 steps with 1 HR and CGA for safety. Husband present throughout session and educated on proper guarding for stair negotiation. Pt currently with functional limitations due to the deficits listed below (see PT Problem List). Pt would benefit from acute skilled PT to address functional impairments. Recommending post-acute HHPT to work towards independence with mobility. Acute PT to follow.         If plan is discharge home, recommend the following: A little help with walking and/or transfers;A little help with bathing/dressing/bathroom;Assistance with cooking/housework;Assist for transportation;Help with stairs or ramp for entrance   Can travel by private vehicle    Yes    Equipment Recommendations Rolling walker (2 wheels);BSC/3in1  Recommendations for Other Services  OT consult    Functional Status Assessment Patient has had a recent decline in their functional status and demonstrates the ability to make significant improvements in function in a reasonable and predictable amount of time.     Precautions / Restrictions Precautions Precautions:  Fall Restrictions Weight Bearing Restrictions Per Provider Order: No      Mobility  Bed Mobility Overal bed mobility: Needs Assistance Bed Mobility: Supine to Sit, Sit to Supine    Supine to sit: Supervision Sit to supine: Supervision   General bed mobility comments: supervision for safety    Transfers Overall transfer level: Needs assistance Equipment used: Rolling walker (2 wheels) Transfers: Sit to/from Stand, Bed to chair/wheelchair/BSC Sit to Stand: Contact guard assist, Min assist   Step pivot transfers: Contact guard assist      General transfer comment: MinA for initial stand for boost-up, progressed to CGA. Cues for hand placement with RW    Ambulation/Gait Ambulation/Gait assistance: Contact guard assist Gait Distance (Feet): 120 Feet Assistive device: Rolling walker (2 wheels) Gait Pattern/deviations: Step-through pattern, Decreased stride length, Narrow base of support, Shuffle Gait velocity: decr     General Gait Details: significantly decreased gait speed with shortened steps. Steady with no overt LOB. Cues for proximity to RW during turns and to relax shoulders  Stairs Stairs: Yes Stairs assistance: Contact guard assist Stair Management: One rail Left, Step to pattern, Forwards, Backwards Number of Stairs: 4 General stair comments: used step stool in room with bed rail as HR. CGA for safety with cues to step up with LLE and down with R LE. Educted on proper guarding with husband observing     Balance Overall balance assessment: Needs assistance, Mild deficits observed, not formally tested Sitting-balance support: No upper extremity supported, Feet supported Sitting balance-Leahy Scale: Fair     Standing balance support: Bilateral upper extremity supported, During functional activity, Reliant on assistive device for balance Standing balance-Leahy Scale: Fair  Standing balance comment: able to stand statically with no AD, RW for gait           Pertinent Vitals/Pain Pain Assessment Pain Assessment: Faces Faces Pain Scale: Hurts little more Pain Location: R LE Pain Descriptors / Indicators: Dull, Discomfort Pain Intervention(s): Limited activity within patient's tolerance, Monitored during session, Repositioned    Home Living Family/patient expects to be discharged to:: Private residence Living Arrangements: Spouse/significant other Available Help at Discharge: Family;Available 24 hours/day Type of Home: House Home Access: Stairs to enter Entrance Stairs-Rails: Doctor, general practice of Steps: 4   Home Layout: One level Home Equipment: Cane - quad      Prior Function Prior Level of Function : Independent/Modified Independent;Driving    Mobility Comments: Ind ADLs Comments: Ind with ADLs and iADLs     Extremity/Trunk Assessment   Upper Extremity Assessment Upper Extremity Assessment: Defer to OT evaluation    Lower Extremity Assessment Lower Extremity Assessment: Generalized weakness (Grossly 4/5)    Cervical / Trunk Assessment Cervical / Trunk Assessment: Normal  Communication   Communication Communication: No apparent difficulties    Cognition Arousal: Alert Behavior During Therapy: WFL for tasks assessed/performed   PT - Cognitive impairments: Problem solving, Sequencing    PT - Cognition Comments: increased time to process commands, multiple cue to sequence mobility Following commands: Impaired Following commands impaired: Only follows one step commands consistently, Follows one step commands with increased time     Cueing Cueing Techniques: Verbal cues, Tactile cues      PT Assessment Patient needs continued PT services  PT Problem List Decreased strength;Decreased activity tolerance;Decreased balance;Decreased mobility;Decreased knowledge of use of DME       PT Treatment Interventions DME instruction;Gait training;Stair training;Functional mobility training;Therapeutic  activities;Therapeutic exercise;Balance training;Neuromuscular re-education;Patient/family education    PT Goals (Current goals can be found in the Care Plan section)  Acute Rehab PT Goals Patient Stated Goal: to get better PT Goal Formulation: With patient/family Time For Goal Achievement: 12/04/23 Potential to Achieve Goals: Good    Frequency Min 2X/week        AM-PAC PT "6 Clicks" Mobility  Outcome Measure Help needed turning from your back to your side while in a flat bed without using bedrails?: A Little Help needed moving from lying on your back to sitting on the side of a flat bed without using bedrails?: A Little Help needed moving to and from a bed to a chair (including a wheelchair)?: A Little Help needed standing up from a chair using your arms (e.g., wheelchair or bedside chair)?: A Little Help needed to walk in hospital room?: A Little Help needed climbing 3-5 steps with a railing? : A Little 6 Click Score: 18    End of Session Equipment Utilized During Treatment: Gait belt Activity Tolerance: Patient tolerated treatment well Patient left: in bed;with call bell/phone within reach;with family/visitor present Nurse Communication: Mobility status;Other (comment) (pt feeling lightheaded at end of session) PT Visit Diagnosis: Other abnormalities of gait and mobility (R26.89);Muscle weakness (generalized) (M62.81)    Time: 1478-2956 PT Time Calculation (min) (ACUTE ONLY): 37 min   Charges:   PT Evaluation $PT Eval Low Complexity: 1 Low PT Treatments $Gait Training: 8-22 mins PT General Charges $$ ACUTE PT VISIT: 1 Visit        [name], PT, DPT Secure Chat Preferred  Rehab Office [phone]   Catherine Ramsey 11/20/2023, 1:57 PM

## 2023-11-20 NOTE — Progress Notes (Signed)
 PHARMACY - ANTICOAGULATION CONSULT NOTE  Pharmacy Consult for heparin  Indication: leg ischemia  Allergies  Allergen Reactions   Lovastatin Itching and Rash   Nitrofurantoin Rash    Patient Measurements: Height: 5\' 7"  (170.2 cm) Weight: 85.7 kg (189 lb) IBW/kg (Calculated) : 61.6 HEPARIN  DW (KG): 79.6  Vital Signs: Temp: 99.1 F (37.3 C) (04/20 0431) Temp Source: Oral (04/20 0431) BP: 155/66 (04/20 0431) Pulse Rate: 65 (04/20 0431)  Labs: Recent Labs    11/18/23 0306 11/18/23 1000 11/19/23 0809 11/19/23 1654 11/20/23 0507  HGB 10.2*  --  10.0*  --  9.4*  HCT 31.5*  --  30.6*  --  28.9*  PLT 138*  --  131*  --  126*  HEPARINUNFRC  --    < > <0.10* <0.10* 0.33  CREATININE 1.10*  --  1.02*  --  1.03*   < > = values in this interval not displayed.    Estimated Creatinine Clearance: 52.2 mL/min (A) (by C-G formula based on SCr of 1.03 mg/dL (H)).  Scheduled:   Assessment: 56 YOF presented with numbness in the right foot, found to have acute on chronic limb ischemia. PMH significant for HLD and PAD. Patient not on anticoagulation prior to hospitalization. Pharmacy consulted for heparin  dosing.   Underwent SFA, popliteal artery pharmacomechanical thrombolysis 4/16 with alteplase , now s/p relook cath. Heparin  restart 8h after sheath removed 4/17.  This morning, heparin  level remains <0.1 (subtherapeutic) with heparin  @ 1150 units/hour. CBC okay - hgb 10, plts 131. No issues reported.   4/20 AM update:  Heparin  level therapeutic after rate increase  Goal of Therapy:  Heparin  level 0.3-0.7 units/ml Monitor platelets by anticoagulation protocol: Yes   Plan:  Cont heparin  1700 units/hr Heparin  level in 8 hours Monitor daily CBC and heparin  level  Monitor for signs/symptoms of bleeding  Follow-up switching to DOAC when appropriate   Silvestre Drum, PharmD, BCPS Clinical Pharmacist Phone: [phone]

## 2023-11-21 DIAGNOSIS — I70221 Atherosclerosis of native arteries of extremities with rest pain, right leg: Secondary | ICD-10-CM | POA: Diagnosis not present

## 2023-11-21 LAB — CBC
HCT: 28 % — ABNORMAL LOW (ref 36.0–46.0)
Hemoglobin: 9 g/dL — ABNORMAL LOW (ref 12.0–15.0)
MCH: 27.8 pg (ref 26.0–34.0)
MCHC: 32.1 g/dL (ref 30.0–36.0)
MCV: 86.4 fL (ref 80.0–100.0)
Platelets: 125 10*3/uL — ABNORMAL LOW (ref 150–400)
RBC: 3.24 MIL/uL — ABNORMAL LOW (ref 3.87–5.11)
RDW: 13.5 % (ref 11.5–15.5)
WBC: 8.1 10*3/uL (ref 4.0–10.5)
nRBC: 0 % (ref 0.0–0.2)

## 2023-11-21 LAB — MAGNESIUM: Magnesium: 1.7 mg/dL (ref 1.7–2.4)

## 2023-11-21 LAB — BASIC METABOLIC PANEL WITH GFR
Anion gap: 10 (ref 5–15)
BUN: 17 mg/dL (ref 8–23)
CO2: 23 mmol/L (ref 22–32)
Calcium: 8.5 mg/dL — ABNORMAL LOW (ref 8.9–10.3)
Chloride: 104 mmol/L (ref 98–111)
Creatinine, Ser: 1.03 mg/dL — ABNORMAL HIGH (ref 0.44–1.00)
GFR, Estimated: 56 mL/min — ABNORMAL LOW (ref >60)
Glucose, Bld: 135 mg/dL — ABNORMAL HIGH (ref 70–99)
Potassium: 3.8 mmol/L (ref 3.5–5.1)
Sodium: 137 mmol/L (ref 135–145)

## 2023-11-21 LAB — HEPARIN LEVEL (UNFRACTIONATED): Heparin Unfractionated: 0.86 [IU]/mL — ABNORMAL HIGH (ref 0.30–0.70)

## 2023-11-21 LAB — GLUCOSE, CAPILLARY
Glucose-Capillary: 136 mg/dL — ABNORMAL HIGH (ref 70–99)
Glucose-Capillary: 173 mg/dL — ABNORMAL HIGH (ref 70–99)
Glucose-Capillary: 183 mg/dL — ABNORMAL HIGH (ref 70–99)
Glucose-Capillary: 157 mg/dL — ABNORMAL HIGH (ref 70–99)

## 2023-11-21 NOTE — Evaluation (Signed)
 Occupational Therapy Evaluation Patient Details Name: Catherine Ramsey MRN: [id number] DOB: 11-Nov-1945 Today's Date: 11/21/2023   History of Present Illness   78 y.o. female presents to [facility] 11/15/23 for R LE pain and numbness. Pt admitted with R LE critical ischemia and moderate LE arterial disease. 4/16 R LE angiogram w/ thrombolysis. 4/17 repeat angiogram w/ unsuccessful recanalization of R TP trunk. PMHx: PVD s/p LLE ATA laser atherectomy and balloon angioplasty (10/15/2021), T2DM, CKD stage IIIb, HTN, HLD, R THA     Clinical Impressions PTA, pt lived with husband and was mod I for ADL and IADL. Upon eval, pt with decreased activity tolerance, RLE pain, and cognition. Pt reliant on RW today, but reports she does not use one at baseline. Pt needing up to CGA-Min A for BADL. Will continue to follow. Recommending discharge home with [facility] for initial home safety eval.      If plan is discharge home, recommend the following:   A little help with walking and/or transfers;A little help with bathing/dressing/bathroom;Assistance with cooking/housework;Assist for transportation;Help with stairs or ramp for entrance     Functional Status Assessment   Patient has had a recent decline in their functional status and demonstrates the ability to make significant improvements in function in a reasonable and predictable amount of time.     Equipment Recommendations   BSC/3in1     Recommendations for Other Services         Precautions/Restrictions   Precautions Precautions: Fall Restrictions Weight Bearing Restrictions Per Provider Order: No     Mobility Bed Mobility Overal bed mobility: Needs Assistance Bed Mobility: Supine to Sit, Sit to Supine     Supine to sit: Supervision Sit to supine: Supervision   General bed mobility comments: supervision for safety    Transfers Overall transfer level: Needs assistance Equipment used: Rolling walker (2 wheels) Transfers: Sit  to/from Stand, Bed to chair/wheelchair/BSC Sit to Stand: Contact guard assist, Min assist           General transfer comment: CGA for safety. 1 cue to optimize hand placement      Balance Overall balance assessment: Needs assistance, Mild deficits observed, not formally tested Sitting-balance support: No upper extremity supported, Feet supported Sitting balance-Leahy Scale: Fair     Standing balance support: Bilateral upper extremity supported, During functional activity, Reliant on assistive device for balance Standing balance-Leahy Scale: Fair Standing balance comment: able to stand statically with no AD, RW for gait                           ADL either performed or assessed with clinical judgement   ADL Overall ADL's : Needs assistance/impaired Eating/Feeding: Modified independent;Sitting Eating/Feeding Details (indicate cue type and reason): at end of session, pt self feeding without difficulty Grooming: Set up;Sitting Grooming Details (indicate cue type and reason): pt deferred doing any ADL at sink Upper Body Bathing: Set up;Sitting   Lower Body Bathing: Minimal assistance;Sit to/from stand   Upper Body Dressing : Set up;Sitting     Lower Body Dressing Details (indicate cue type and reason): pt husband donned socks for her Toilet Transfer: Contact guard assist;Ambulation;Rolling walker (2 wheels)   Toileting- Clothing Manipulation and Hygiene: Set up;Sitting/lateral lean       Functional mobility during ADLs: Contact guard assist;Rolling walker (2 wheels)       Vision Baseline Vision/History: 1 Wears glasses Ability to See in Adequate Light: 0 Adequate Patient Visual Report: No change  from baseline Vision Assessment?: No apparent visual deficits     Perception         Praxis         Pertinent Vitals/Pain Pain Assessment Pain Assessment: Faces Faces Pain Scale: Hurts little more Pain Location: R LE Pain Descriptors / Indicators: Dull,  Discomfort Pain Intervention(s): Limited activity within patient's tolerance, Monitored during session     Extremity/Trunk Assessment Upper Extremity Assessment Upper Extremity Assessment: Overall WFL for tasks assessed   Lower Extremity Assessment Lower Extremity Assessment: Defer to PT evaluation   Cervical / Trunk Assessment Cervical / Trunk Assessment: Normal   Communication Communication Communication: No apparent difficulties   Cognition Arousal: Alert Behavior During Therapy: WFL for tasks assessed/performed Cognition: Cognition impaired       Memory impairment (select all impairments): Short-term memory Attention impairment (select first level of impairment): Selective attention (although easily distracted) Executive functioning impairment (select all impairments): Organization, Problem solving OT - Cognition Comments: follows one step commands with increased time. Tangential at times and is easily distracted                 Following commands: Impaired Following commands impaired: Follows one step commands with increased time, Follows multi-step commands inconsistently     Cueing  General Comments   Cueing Techniques: Verbal cues;Tactile cues  VSS   Exercises     Shoulder Instructions      Home Living Family/patient expects to be discharged to:: Private residence Living Arrangements: Spouse/significant other Available Help at Discharge: Family;Available 24 hours/day Type of Home: House Home Access: Stairs to enter [employer] of Steps: 4 Entrance Stairs-Rails: Right;Left Home Layout: One level     Bathroom Shower/Tub: Chief Strategy Officer: Standard     Home Equipment: Cane - quad          Prior Functioning/Environment Prior Level of Function : Independent/Modified Independent;Driving             Mobility Comments: Ind ADLs Comments: Ind with ADLs and iADLs    OT Problem List: Decreased  strength;Decreased activity tolerance;Impaired balance (sitting and/or standing);Decreased cognition;Decreased safety awareness;Decreased knowledge of use of DME or AE;Pain   OT Treatment/Interventions: Self-care/ADL training;Therapeutic exercise;DME and/or AE instruction;Therapeutic activities;Patient/family education;Balance training;Cognitive remediation/compensation      OT Goals(Current goals can be found in the care plan section)   Acute Rehab OT Goals Patient Stated Goal: go home OT Goal Formulation: With patient Time For Goal Achievement: 12/05/23 Potential to Achieve Goals: Good   OT Frequency:  Min 1X/week    Co-evaluation              AM-PAC OT "6 Clicks" Daily Activity     Outcome Measure Help from another person eating meals?: None Help from another person taking care of personal grooming?: A Little Help from another person toileting, which includes using toliet, bedpan, or urinal?: A Little Help from another person bathing (including washing, rinsing, drying)?: A Little Help from another person to put on and taking off regular upper body clothing?: A Little Help from another person to put on and taking off regular lower body clothing?: A Lot 6 Click Score: 18   End of Session Equipment Utilized During Treatment: Gait belt;Rolling walker (2 wheels) Nurse Communication: Mobility status  Activity Tolerance: Patient tolerated treatment well Patient left: in chair;with call bell/phone within reach;with family/visitor present  OT Visit Diagnosis: Unsteadiness on feet (R26.81);Muscle weakness (generalized) (M62.81);Pain Pain - Right/Left: Right Pain - part of body: Ankle and  joints of foot                Time: 0865-7846 OT Time Calculation (min): 25 min Charges:  OT General Charges $OT Visit: 1 Visit OT Evaluation $OT Eval Low Complexity: 1 Low OT Treatments $Self Care/Home Management : 8-22 mins  Karilyn Ouch, OTR/L [facility] Acute  Rehabilitation Office: [phone]   Catherine Ramsey 11/21/2023, 2:40 PM

## 2023-11-21 NOTE — Progress Notes (Signed)
 [facility] Progress Note Patient: SARIAH HENKIN ZOX:[id number] DOB: 03/25/1946 DOA: 11/15/2023  DOS: the patient was seen and examined on 11/21/2023  Brief Hospital Course: JASAMINE POTTINGER is a 78 y.o. female with medical history significant for PVD s/p LLE ATA laser atherectomy and balloon angioplasty (10/15/2021), T2DM, CKD stage IIIb, HTN, HLD who is admitted with acute on chronic critical limb ischemia of the right lower extremity. Vascular surgery was consulted. 4/16 underwent angiography and tPA therapy. 4/17 repeat angiogram, unsuccessful recanalization of right TP trunk. 4/18 transferred to floor. 4/22 Right femoral peroneal bypass scheduled.  Assessment and Plan: Acute on chronic critical limb ischemia right lower extremity History of intervention to the left anterior tibial artery 2023 by IR: CTA runoff study shows acute occlusive thrombus of the right popliteal artery behind the knee, moderate to severe stenosis above the knee. Vascular surgery was consulted. Underwent angiography on 4/16, treated with tPA for below popliteal stenosis. Unfortunately on 4/17 occlusion appeared to have some chronic and was not amenable to intervention. Continues to have rest pain.  Vascular surgery now recommending right femoral peroneal bypass.  Scheduled on 4/22. Medications adjusted for pain.   Normocytic anemia: Baseline hemoglobin appears to be around 11. Hemoglobin postprocedure around 9.  Monitor.  No active bleeding.   Type 2 diabetes melitis uncontrolled with hyperglycemia without long-term insulin  use. Hold home meds and placed on SSI.  On Lantus  at home. Hemoglobin A1c 9.8.  Will require better diabetes control on discharge.   CKD stage IIIb: Renal function stable, continue to monitor. At risk for worsening after angiography.  Monitor closely.   Hypertension: Continue amlodipine ,  Hold losartan , HCTZ.   Hyperlipidemia: Resume rosuvastatin  and Zetia .   Subjective: No  nausea no vomiting.  Ongoing pain.  No fever no chills.  Physical Exam: Moderate distress. S1-S2 present. Clear to auscultation.  Data Reviewed: I have Reviewed nursing notes, Vitals, and Lab results. Since last encounter, pertinent lab results CBC and BMP   .  Reordered CBC and BMP. Disposition: Status is: Inpatient Remains inpatient appropriate because: Needing bypass surgery tomorrow.   Family Communication: Husband at bedside Level of care: Telemetry Cardiac   Vitals:   11/21/23 0023 11/21/23 0400 11/21/23 0832 11/21/23 1710  BP: (!) 140/65 (!) 136/59 135/63 (!) 156/75  Pulse: 77 83 70 88  Resp: 17 14 18 16   Temp: 99.6 F (37.6 C) 99.2 F (37.3 C) 98.3 F (36.8 C) 97.9 F (36.6 C)  TempSrc: Oral Oral Oral Oral  SpO2: 100% 99% 99% 92%  Weight:      Height:         Author: Charlean Congress, MD 11/21/2023 5:14 PM  Please look on [email or web address] to find out who is on call.

## 2023-11-21 NOTE — Progress Notes (Signed)
 Mobility Specialist Progress Note:    11/21/23 0906  Mobility  Activity Ambulated with assistance in room;Ambulated with assistance in hallway  Level of Assistance Contact guard assist, steadying assist  Assistive Device Front wheel walker  Distance Ambulated (ft) 160 ft  Activity Response Tolerated well  Mobility Referral Yes  Mobility visit 1 Mobility  Mobility Specialist Start Time (ACUTE ONLY) [id number]  Mobility Specialist Stop Time (ACUTE ONLY) 0920  Mobility Specialist Time Calculation (min) (ACUTE ONLY) 14 min   Pt received in bed, daughter at bedside. Agreeable to mobility session. CGA for safety with RW. Pt ambulated in hallway, limited by weakness. Tolerated well, max HR 106 bpm. Returned pt to room, left with all needs met, call bell in reach.  Jeffory Snelgrove Mobility Specialist Please contact via Special educational needs teacher or  Rehab office at [phone]

## 2023-11-21 NOTE — Consult Note (Signed)
 VAT: consult for PIV d/t current IV location causing IV pump to beep.  Positioned pt arm so pump will not beep.

## 2023-11-21 NOTE — Progress Notes (Signed)
 PHARMACY - ANTICOAGULATION CONSULT NOTE  Pharmacy Consult for heparin  Indication: leg ischemia  Allergies  Allergen Reactions   Lovastatin Itching and Rash   Nitrofurantoin Rash    Patient Measurements: Height: 5\' 7"  (170.2 cm) Weight: 85.7 kg (189 lb) IBW/kg (Calculated) : 61.6 HEPARIN  DW (KG): 79.6  Vital Signs: Temp: 98.3 F (36.8 C) (04/21 0832) Temp Source: Oral (04/21 0832) BP: 135/63 (04/21 0832) Pulse Rate: 70 (04/21 0832)  Labs: Recent Labs    11/19/23 0809 11/19/23 1654 11/20/23 0507 11/20/23 1411 11/21/23 0414  HGB 10.0*  --  9.4*  --  9.0*  HCT 30.6*  --  28.9*  --  28.0*  PLT 131*  --  126*  --  125*  HEPARINUNFRC <0.10*   < > 0.33 0.52 0.86*  CREATININE 1.02*  --  1.03*  --  1.03*   < > = values in this interval not displayed.    Estimated Creatinine Clearance: 52.2 mL/min (A) (by C-G formula based on SCr of 1.03 mg/dL (H)).  Scheduled:   Assessment: 59 YOF presented with numbness in the right foot, found to have acute on chronic limb ischemia. PMH significant for HLD and PAD. Patient not on anticoagulation prior to hospitalization. Pharmacy consulted for heparin  dosing.   Underwent SFA, popliteal artery pharmacomechanical thrombolysis 4/16 with alteplase , now s/p relook cath. Heparin  restart 8h after sheath removed 4/17.  Heparin  level elevated this AM, CBC stable  Goal of Therapy:  Heparin  level 0.3-0.7 units/ml Monitor platelets by anticoagulation protocol: Yes   Plan:  Decrease heparin  to 1550 units / hr Monitor daily CBC and heparin  level  Follow-up switching to DOAC when appropriate   Thank you. [name], PharmD 11/21/2023 8:34 AM

## 2023-11-21 NOTE — Progress Notes (Addendum)
  Progress Note    11/21/2023 6:46 AM 4 Days Post-Op  Subjective:  says she is still having pain in the right foot  Tm 99.6    Vitals:   11/21/23 0023 11/21/23 0400  BP: (!) 140/65 (!) 136/59  Pulse: 77 83  Resp: 17 14  Temp: 99.6 F (37.6 C) 99.2 F (37.3 C)  SpO2: 100% 99%    Physical Exam: General:  sleeping, wakes easily Lungs:  non labored Extremities:  unable to get doppler flow at right ankle or out on the foot   CBC    Component Value Date/Time   WBC 8.1 11/21/2023 0414   RBC 3.24 (L) 11/21/2023 0414   HGB 9.0 (L) 11/21/2023 0414   HCT 28.0 (L) 11/21/2023 0414   PLT 125 (L) 11/21/2023 0414   MCV 86.4 11/21/2023 0414   MCH 27.8 11/21/2023 0414   MCHC 32.1 11/21/2023 0414   RDW 13.5 11/21/2023 0414   LYMPHSABS 3.2 11/15/2023 1546   MONOABS 0.5 11/15/2023 1546   EOSABS 0.2 11/15/2023 1546   BASOSABS 0.1 11/15/2023 1546    BMET    Component Value Date/Time   NA 137 11/21/2023 0414   NA 143 03/26/2019 1403   K 3.8 11/21/2023 0414   CL 104 11/21/2023 0414   CO2 23 11/21/2023 0414   GLUCOSE 135 (H) 11/21/2023 0414   BUN 17 11/21/2023 0414   BUN 11 03/26/2019 1403   CREATININE 1.03 (H) 11/21/2023 0414   CALCIUM  8.5 (L) 11/21/2023 0414   GFRNONAA 56 (L) 11/21/2023 0414   GFRAA 56 (L) 03/26/2019 1403    INR    Component Value Date/Time   INR 1.2 11/15/2023 1546     Intake/Output Summary (Last 24 hours) at 11/21/2023 0646 Last data filed at 11/21/2023 0300 Gross per 24 hour  Intake 997.16 ml  Output --  Net 997.16 ml      Assessment/Plan:  78 y.o. female is s/p:  Thrombolysis right SFA, popliteal arteries 4/16 and recheck on 4/17 4 Days Post-Op   -Dr. Susi Eric to see pt and determine about femoral to peroneal bypass on the right leg.  Motor and sensory remain in tact right foot.  -DVT prophylaxis:  heparin  gtt   Samantha Rhyne, PA-C [facility] [phone] 11/21/2023 6:46 AM  VASCULAR STAFF ADDENDUM: I have  independently interviewed and examined the patient. I agree with the above.  Plan for right femoral to peroneal bypass tomorrow NPO midnight Consent ordered  Philipp Brawn MD [facility] Phone Number: [phone] 11/21/2023 1:31 PM

## 2023-11-22 ENCOUNTER — Encounter (HOSPITAL_COMMUNITY)
Admission: EM | Disposition: A | Discharge status: Home Health | Payer: Self-pay | Source: Home / Self Care | Attending: Internal Medicine

## 2023-11-22 ENCOUNTER — Inpatient Hospital Stay (HOSPITAL_COMMUNITY): Admitting: Registered Nurse

## 2023-11-22 ENCOUNTER — Other Ambulatory Visit: Payer: Self-pay

## 2023-11-22 DIAGNOSIS — E119 Type 2 diabetes mellitus without complications: Secondary | ICD-10-CM | POA: Diagnosis not present

## 2023-11-22 DIAGNOSIS — Z794 Long term (current) use of insulin: Secondary | ICD-10-CM | POA: Diagnosis not present

## 2023-11-22 DIAGNOSIS — I10 Essential (primary) hypertension: Secondary | ICD-10-CM | POA: Diagnosis not present

## 2023-11-22 DIAGNOSIS — I70221 Atherosclerosis of native arteries of extremities with rest pain, right leg: Secondary | ICD-10-CM

## 2023-11-22 LAB — BASIC METABOLIC PANEL WITH GFR
Anion gap: 11 (ref 5–15)
BUN: 15 mg/dL (ref 8–23)
CO2: 23 mmol/L (ref 22–32)
Calcium: 8.7 mg/dL — ABNORMAL LOW (ref 8.9–10.3)
Chloride: 102 mmol/L (ref 98–111)
Creatinine, Ser: 1.02 mg/dL — ABNORMAL HIGH (ref 0.44–1.00)
GFR, Estimated: 57 mL/min — ABNORMAL LOW (ref >60)
Glucose, Bld: 138 mg/dL — ABNORMAL HIGH (ref 70–99)
Potassium: 4.2 mmol/L (ref 3.5–5.1)
Sodium: 136 mmol/L (ref 135–145)

## 2023-11-22 LAB — CBC
HCT: 28.7 % — ABNORMAL LOW (ref 36.0–46.0)
Hemoglobin: 9.2 g/dL — ABNORMAL LOW (ref 12.0–15.0)
MCH: 27.8 pg (ref 26.0–34.0)
MCHC: 32.1 g/dL (ref 30.0–36.0)
MCV: 86.7 fL (ref 80.0–100.0)
Platelets: 155 10*3/uL (ref 150–400)
RBC: 3.31 MIL/uL — ABNORMAL LOW (ref 3.87–5.11)
RDW: 13.2 % (ref 11.5–15.5)
WBC: 9 10*3/uL (ref 4.0–10.5)
nRBC: 0 % (ref 0.0–0.2)

## 2023-11-22 LAB — GLUCOSE, CAPILLARY
Glucose-Capillary: 143 mg/dL — ABNORMAL HIGH (ref 70–99)
Glucose-Capillary: 163 mg/dL — ABNORMAL HIGH (ref 70–99)
Glucose-Capillary: 130 mg/dL — ABNORMAL HIGH (ref 70–99)
Glucose-Capillary: 203 mg/dL — ABNORMAL HIGH (ref 70–99)
Glucose-Capillary: 174 mg/dL — ABNORMAL HIGH (ref 70–99)

## 2023-11-22 LAB — POCT ACTIVATED CLOTTING TIME: Activated Clotting Time: 291 s

## 2023-11-22 LAB — HEPARIN LEVEL (UNFRACTIONATED): Heparin Unfractionated: >1.1 [IU]/mL — ABNORMAL HIGH (ref 0.30–0.70)

## 2023-11-22 LAB — MAGNESIUM: Magnesium: 1.7 mg/dL (ref 1.7–2.4)

## 2023-11-22 SURGERY — CREATION, BYPASS, ARTERIAL, FEMORAL TO PERONEAL, USING GRAFT
Anesthesia: General | Site: Leg Lower | Laterality: Right

## 2023-11-22 MED ORDER — PHENYLEPHRINE HCL-NACL 20-0.9 MG/250ML-% IV SOLN
INTRAVENOUS | Status: DC | PRN
  Administered 2023-11-22: 40 ug/min via INTRAVENOUS

## 2023-11-22 MED ORDER — HEPARIN 6000 UNIT IRRIGATION SOLUTION
Status: DC | PRN
  Administered 2023-11-22: 1

## 2023-11-22 MED ORDER — ACETAMINOPHEN 500 MG PO TABS
ORAL_TABLET | ORAL | Status: AC
  Administered 2023-11-22: 1000 mg via ORAL
  Filled 2023-11-22: qty 2

## 2023-11-22 MED ORDER — ONDANSETRON HCL 4 MG/2ML IJ SOLN
INTRAMUSCULAR | Status: DC | PRN
  Administered 2023-11-22: 4 mg via INTRAVENOUS

## 2023-11-22 MED ORDER — PROTAMINE SULFATE 10 MG/ML IV SOLN
INTRAVENOUS | Status: DC | PRN
  Administered 2023-11-22: 40 mg via INTRAVENOUS

## 2023-11-22 MED ORDER — PHENYLEPHRINE 80 MCG/ML (10ML) SYRINGE FOR IV PUSH (FOR BLOOD PRESSURE SUPPORT)
PREFILLED_SYRINGE | INTRAVENOUS | Status: DC | PRN
  Administered 2023-11-22: 80 ug via INTRAVENOUS
  Administered 2023-11-22: 160 ug via INTRAVENOUS

## 2023-11-22 MED ORDER — LIDOCAINE 2% (20 MG/ML) 5 ML SYRINGE
INTRAMUSCULAR | Status: DC | PRN
  Administered 2023-11-22: 80 mg via INTRAVENOUS

## 2023-11-22 MED ORDER — HEPARIN SODIUM (PORCINE) 1000 UNIT/ML IJ SOLN
INTRAMUSCULAR | Status: DC | PRN
Start: 2023-11-22 — End: 2023-11-22
  Administered 2023-11-22: 8000 [IU] via INTRAVENOUS
  Administered 2023-11-22: 2000 [IU] via INTRAVENOUS

## 2023-11-22 MED ORDER — FENTANYL CITRATE (PF) 250 MCG/5ML IJ SOLN
INTRAMUSCULAR | Status: AC
  Filled 2023-11-22: qty 5

## 2023-11-22 MED ORDER — 0.9 % SODIUM CHLORIDE (POUR BTL) OPTIME
TOPICAL | Status: DC | PRN
  Administered 2023-11-22 (×2): 1000 mL

## 2023-11-22 MED ORDER — ROCURONIUM BROMIDE 10 MG/ML (PF) SYRINGE
PREFILLED_SYRINGE | INTRAVENOUS | Status: AC
  Filled 2023-11-22: qty 10

## 2023-11-22 MED ORDER — PROPOFOL 10 MG/ML IV BOLUS
INTRAVENOUS | Status: AC
  Filled 2023-11-22: qty 20

## 2023-11-22 MED ORDER — DEXAMETHASONE SODIUM PHOSPHATE 10 MG/ML IJ SOLN
INTRAMUSCULAR | Status: AC
  Filled 2023-11-22: qty 1

## 2023-11-22 MED ORDER — ALBUMIN HUMAN 5 % IV SOLN
INTRAVENOUS | Status: DC | PRN
Start: 2023-11-22 — End: 2023-11-22

## 2023-11-22 MED ORDER — SODIUM CHLORIDE 0.9 % IV SOLN: INTRAVENOUS | Status: DC

## 2023-11-22 MED ORDER — CHLORHEXIDINE GLUCONATE 0.12 % MT SOLN: 15.0000 mL | Freq: Once | OROMUCOSAL | Status: AC

## 2023-11-22 MED ORDER — CEFAZOLIN SODIUM-DEXTROSE 2-3 GM-%(50ML) IV SOLR
INTRAVENOUS | Status: DC | PRN
  Administered 2023-11-22: 2 g via INTRAVENOUS

## 2023-11-22 MED ORDER — LIDOCAINE 2% (20 MG/ML) 5 ML SYRINGE
INTRAMUSCULAR | Status: AC
  Filled 2023-11-22: qty 5

## 2023-11-22 MED ORDER — CHLORHEXIDINE GLUCONATE 0.12 % MT SOLN
OROMUCOSAL | Status: AC
  Administered 2023-11-22: 15 mL via OROMUCOSAL
  Filled 2023-11-22: qty 15

## 2023-11-22 MED ORDER — ACETAMINOPHEN 500 MG PO TABS: 1000.0000 mg | ORAL_TABLET | Freq: Once | ORAL | Status: AC

## 2023-11-22 MED ORDER — SODIUM CHLORIDE 0.9 % IV SOLN
INTRAVENOUS | Status: DC | PRN
Start: 2023-11-22 — End: 2023-11-22

## 2023-11-22 MED ORDER — DEXAMETHASONE SODIUM PHOSPHATE 10 MG/ML IJ SOLN
INTRAMUSCULAR | Status: DC | PRN
  Administered 2023-11-22: 5 mg via INTRAVENOUS

## 2023-11-22 MED ORDER — AMISULPRIDE (ANTIEMETIC) 5 MG/2ML IV SOLN: 10.0000 mg | Freq: Once | INTRAVENOUS | Status: DC | PRN

## 2023-11-22 MED ORDER — HEPARIN (PORCINE) 25000 UT/250ML-% IV SOLN
1400.0000 [IU]/h | INTRAVENOUS | Status: DC
  Administered 2023-11-23: 1400 [IU]/h via INTRAVENOUS
  Filled 2023-11-22: qty 250

## 2023-11-22 MED ORDER — FENTANYL CITRATE (PF) 100 MCG/2ML IJ SOLN
INTRAMUSCULAR | Status: AC
  Filled 2023-11-22: qty 2

## 2023-11-22 MED ORDER — ORAL CARE MOUTH RINSE: 15.0000 mL | Freq: Once | OROMUCOSAL | Status: AC

## 2023-11-22 MED ORDER — HEPARIN 6000 UNIT IRRIGATION SOLUTION
Status: AC
  Filled 2023-11-22: qty 500

## 2023-11-22 MED ORDER — HEMOSTATIC AGENTS (NO CHARGE) OPTIME
TOPICAL | Status: DC | PRN
  Administered 2023-11-22 (×2): 1 via TOPICAL

## 2023-11-22 MED ORDER — LACTATED RINGERS IV SOLN: INTRAVENOUS | Status: DC

## 2023-11-22 MED ORDER — FENTANYL CITRATE (PF) 250 MCG/5ML IJ SOLN
INTRAMUSCULAR | Status: DC | PRN
  Administered 2023-11-22: 50 ug via INTRAVENOUS
  Administered 2023-11-22: 100 ug via INTRAVENOUS
  Administered 2023-11-22 (×2): 50 ug via INTRAVENOUS

## 2023-11-22 MED ORDER — OXYCODONE HCL 5 MG PO TABS: 5.0000 mg | ORAL_TABLET | Freq: Once | ORAL | Status: DC | PRN

## 2023-11-22 MED ORDER — ROCURONIUM BROMIDE 10 MG/ML (PF) SYRINGE
PREFILLED_SYRINGE | INTRAVENOUS | Status: DC | PRN
  Administered 2023-11-22: 20 mg via INTRAVENOUS
  Administered 2023-11-22: 50 mg via INTRAVENOUS

## 2023-11-22 MED ORDER — ONDANSETRON HCL 4 MG/2ML IJ SOLN
INTRAMUSCULAR | Status: AC
  Filled 2023-11-22: qty 2

## 2023-11-22 MED ORDER — OXYCODONE HCL 5 MG/5ML PO SOLN: 5.0000 mg | Freq: Once | ORAL | Status: DC | PRN

## 2023-11-22 MED ORDER — SUGAMMADEX SODIUM 200 MG/2ML IV SOLN
INTRAVENOUS | Status: DC | PRN
  Administered 2023-11-22: 300 mg via INTRAVENOUS

## 2023-11-22 MED ORDER — HALOPERIDOL LACTATE 5 MG/ML IJ SOLN
2.5000 mg | Freq: Once | INTRAMUSCULAR | Status: AC
  Administered 2023-11-22: 2.5 mg via INTRAVENOUS
  Filled 2023-11-22: qty 0.5

## 2023-11-22 MED ORDER — PROPOFOL 10 MG/ML IV BOLUS
INTRAVENOUS | Status: DC | PRN
  Administered 2023-11-22: 120 mg via INTRAVENOUS

## 2023-11-22 MED ORDER — PROTAMINE SULFATE 10 MG/ML IV SOLN
INTRAVENOUS | Status: AC
  Filled 2023-11-22: qty 5

## 2023-11-22 MED ORDER — PHENYLEPHRINE 80 MCG/ML (10ML) SYRINGE FOR IV PUSH (FOR BLOOD PRESSURE SUPPORT)
PREFILLED_SYRINGE | INTRAVENOUS | Status: AC
Start: 2023-11-22 — End: ?
  Filled 2023-11-22: qty 10

## 2023-11-22 MED ORDER — HALOPERIDOL LACTATE 5 MG/ML IJ SOLN: 2.0000 mg | Freq: Four times a day (QID) | INTRAMUSCULAR | Status: DC | PRN

## 2023-11-22 MED ORDER — FENTANYL CITRATE (PF) 100 MCG/2ML IJ SOLN
25.0000 ug | INTRAMUSCULAR | Status: DC | PRN
  Administered 2023-11-22 (×2): 50 ug via INTRAVENOUS

## 2023-11-22 SURGICAL SUPPLY — 50 items
BAG COUNTER SPONGE SURGICOUNT (BAG) ×2 IMPLANT
BANDAGE ESMARK 6X9 LF (GAUZE/BANDAGES/DRESSINGS) IMPLANT
CANISTER SUCT 3000ML PPV (MISCELLANEOUS) ×2 IMPLANT
CANNULA VESSEL 3MM 2 BLNT TIP (CANNULA) IMPLANT
CLIP TI MEDIUM 24 (CLIP) ×2 IMPLANT
CLIP TI MEDIUM 6 (CLIP) ×2 IMPLANT
CLIP TI WIDE RED SMALL 24 (CLIP) ×2 IMPLANT
CLIP TI WIDE RED SMALL 6 (CLIP) ×2 IMPLANT
CUFF TOURN SGL QUICK 42 (TOURNIQUET CUFF) IMPLANT
CUFF TRNQT CYL 24X4X16.5-23 (TOURNIQUET CUFF) IMPLANT
CUFF TRNQT CYL 34X4.125X (TOURNIQUET CUFF) IMPLANT
DERMABOND ADVANCED .7 DNX12 (GAUZE/BANDAGES/DRESSINGS) ×2 IMPLANT
DRAIN CHANNEL 15F RND FF W/TCR (WOUND CARE) IMPLANT
DRAPE C-ARM 42X72 X-RAY (DRAPES) IMPLANT
DRAPE HALF SHEET 40X57 (DRAPES) IMPLANT
DRAPE WARM FLUID 44X44 (DRAPES) IMPLANT
ELECTRODE REM PT RTRN 9FT ADLT (ELECTROSURGICAL) ×2 IMPLANT
EVACUATOR SILICONE 100CC (DRAIN) IMPLANT
GLOVE BIOGEL PI IND STRL 7.0 (GLOVE) ×2 IMPLANT
GOWN STRL REUS W/ TWL LRG LVL3 (GOWN DISPOSABLE) ×4 IMPLANT
GOWN STRL REUS W/ TWL XL LVL3 (GOWN DISPOSABLE) ×2 IMPLANT
GRAFT PROPATEN W/RING 6X80X60 (Vascular Products) IMPLANT
HEMOSTAT SNOW SURGICEL 2X4 (HEMOSTASIS) IMPLANT
INSERT FOGARTY SM (MISCELLANEOUS) IMPLANT
KIT BASIN OR (CUSTOM PROCEDURE TRAY) ×2 IMPLANT
KIT TURNOVER KIT B (KITS) ×2 IMPLANT
MARKER GRAFT CORONARY BYPASS (MISCELLANEOUS) IMPLANT
NS IRRIG 1000ML POUR BTL (IV SOLUTION) ×4 IMPLANT
PACK PERIPHERAL VASCULAR (CUSTOM PROCEDURE TRAY) ×2 IMPLANT
PAD ARMBOARD POSITIONER FOAM (MISCELLANEOUS) ×4 IMPLANT
POWDER SURGICEL 3.0 GRAM (HEMOSTASIS) IMPLANT
PUNCH AORTIC ROTATE 5MM 8IN (MISCELLANEOUS) IMPLANT
SET MICROPUNCTURE 5F STIFF (MISCELLANEOUS) IMPLANT
STOPCOCK 4 WAY LG BORE MALE ST (IV SETS) IMPLANT
SUT ETHILON 3 0 PS 1 (SUTURE) IMPLANT
SUT MNCRL AB 4-0 PS2 18 (SUTURE) ×4 IMPLANT
SUT PROLENE 5 0 C 1 24 (SUTURE) ×2 IMPLANT
SUT PROLENE 6 0 BV (SUTURE) ×2 IMPLANT
SUT PROLENE 7 0 BV 1 (SUTURE) IMPLANT
SUT SILK 2 0 SH (SUTURE) ×2 IMPLANT
SUT SILK 2 0 SH CR/8 (SUTURE) ×2 IMPLANT
SUT SILK 3-0 18XBRD TIE 12 (SUTURE) IMPLANT
SUT VIC AB 2-0 CT1 TAPERPNT 27 (SUTURE) ×4 IMPLANT
SUT VIC AB 3-0 SH 27X BRD (SUTURE) ×4 IMPLANT
TAPE UMBILICAL 1/8X30 (MISCELLANEOUS) IMPLANT
TOWEL GREEN STERILE (TOWEL DISPOSABLE) ×2 IMPLANT
TRAY FOLEY MTR SLVR 16FR STAT (SET/KITS/TRAYS/PACK) ×2 IMPLANT
TUBING EXTENTION W/L.L. (IV SETS) IMPLANT
UNDERPAD 30X36 HEAVY ABSORB (UNDERPADS AND DIAPERS) ×2 IMPLANT
WATER STERILE IRR 1000ML POUR (IV SOLUTION) ×2 IMPLANT

## 2023-11-22 NOTE — Transfer of Care (Signed)
 Immediate Anesthesia Transfer of Care Note  Patient: Catherine Ramsey  Procedure(s) Performed: CREATION, BYPASS, ARTERIAL, FEMORAL TO PERONEAL, USING PROPATEN X 80CM GRAFT (Right: Leg Lower)  Patient Location: PACU  Anesthesia Type:General  Level of Consciousness: drowsy and patient cooperative  Airway & Oxygen Therapy: Patient connected to face mask oxygen  Post-op Assessment: Report given to RN, Post -op Vital signs reviewed and stable, and Patient moving all extremities X 4  Post vital signs: Reviewed and stable  Last Vitals:  Vitals Value Taken Time  BP 172/73 11/22/23 1309  Temp    Pulse 82 11/22/23 1310  Resp 15 11/22/23 1310  SpO2 100 % 11/22/23 1310  Vitals shown include unfiled device data.  Last Pain:  Vitals:   11/22/23 0902  TempSrc:   PainSc: 0-No pain      Patients Stated Pain Goal: 0 (11/22/23 0027)  Complications: There were no known notable events for this encounter.

## 2023-11-22 NOTE — Progress Notes (Addendum)
  Progress Note    11/22/2023 7:35 AM 5 Days Post-Op  Unsuccessful thrombolysis of RLE with no further endovascular revascularization options Rest pain of Right lower extremity She is scheduled for right lower extremity fem to peroneal bypass today in OR with Dr. Susi Eric No further questions regarding her surgery today Keep NPO Consent ordered   Catherine Finical, Catherine Ramsey [facility] [phone] 11/22/2023 7:35 AM  VASCULAR STAFF ADDENDUM: I have independently interviewed and examined the patient. I agree with the above.  Right femoral-peroneal bypass today. Risks and benefits reviewed, she elected to proceed.  Philipp Brawn MD [facility] Phone Number: [phone] 11/22/2023 9:19 AM

## 2023-11-22 NOTE — Progress Notes (Signed)
 PHARMACY - ANTICOAGULATION CONSULT NOTE  Pharmacy Consult for heparin  Indication: leg ischemia  Allergies  Allergen Reactions   Lovastatin Itching and Rash   Nitrofurantoin Rash    Patient Measurements: Height: (P) 5\' 7"  (170.2 cm) Weight: (P) 83.9 kg (185 lb) IBW/kg (Calculated) : (P) 61.6 HEPARIN  DW (KG): (P) 79.1  Vital Signs: Temp: 98.1 F (36.7 C) (04/22 1310) Temp Source: (P) Oral (04/22 0854) BP: 135/72 (04/22 1515) Pulse Rate: 88 (04/22 1500)  Labs: Recent Labs    11/20/23 0507 11/20/23 1411 11/21/23 0414 11/22/23 0512  HGB 9.4*  --  9.0* 9.2*  HCT 28.9*  --  28.0* 28.7*  PLT 126*  --  125* 155  HEPARINUNFRC 0.33 0.52 0.86* >1.10*  CREATININE 1.03*  --  1.03* 1.02*    Estimated Creatinine Clearance: 52.7 mL/min (A) (by C-G formula based on SCr of 1.02 mg/dL (H)).  Scheduled:   Assessment: Catherine Ramsey presented with numbness in the right foot, found to have acute on chronic limb ischemia. PMH significant for HLD and PAD. Patient not on anticoagulation prior to hospitalization. Pharmacy consulted for heparin  dosing.   S/p repeat bypass     Goal of Therapy:  Heparin  level 0.3-0.7 units/ml Monitor platelets by anticoagulation protocol: Yes   Plan:  Heparin  to resume at 4:30 pm at 1400 units / hr Monitor daily CBC and heparin  level  Follow-up switching to DOAC when appropriate   Thank you. [name], PharmD 11/22/2023 3:42 PM

## 2023-11-22 NOTE — Op Note (Signed)
 OPERATIVE NOTE  PROCEDURE:   Right common femoral to peroneal artery bypass with PTFE and vein cuff  PRE-OPERATIVE DIAGNOSIS: CLTI with rest pain  POST-OPERATIVE DIAGNOSIS: same as above   SURGEON: Philipp Brawn MD  ASSISTANT(S): Londell River, MD; Belen Bowers, PA  Given the complexity of the case,  the assistant was necessary in order to expedient the procedure and safely perform the technical aspects of the operation.  The assistant provided traction and countertraction to assist with exposure of the artery proximally and distally.  They assisted with suture ligature of multiple branches.  Their assistance was critical in the performance of both the proximal and distal anastomosis.These skills, especially following the Prolene suture for the anastomosis, could not have been adequately performed by a scrub tech assistant.   ANESTHESIA: general  ESTIMATED BLOOD LOSS: 150 cc  FINDING(S): Inadequate size of right GSV for bypass conduit Mild disease of right common femoral. Moderate medial calcinosis of the right peroneal although no significant disease in the lumen. Multiphasic signal with augmentation of the peroneal distal to the anastomosis in the wound and multiphasic signal with augmentation of the peroneal at the ankle.  SPECIMEN(S):  none  INDICATIONS:   Catherine Ramsey is a 78 y.o. female who originally presented with acute on chronic [place] 2A ischemia.  Last week she underwent attempt at lysis which did open up her acute occlusion of the popliteal although vasculature below the knee was still significantly compromised and she was still having rest pain.  She was noted to have one-vessel runoff via the peroneal and risks and benefits of femoral to peroneal bypass were reviewed, she expressed understanding and elected to proceed.  DESCRIPTION: After full informed written consent was obtained, the patient was brought back to the operating room and placed supine upon  the operating table.  Prior to induction, the patient was given intravenous antibiotics.  After obtaining adequate anesthesia, the patient's right leg  was circumferentially prepped and draped in the standard fashion for a right femoral to tibial artery bypass operation.    First the patients right greater saphenous vein was mapped with ultrasound and the preoperative vein mapping was confirmed, her vein was not adequate size for a conduit. Therefore we planned to use PTFE with a vein patch on the distal anastomosis.   An oblique incision was made in the right groin, over the common femoral artery.  Using blunt dissection and electrocautery, the artery was dissected out from the inguinal ligament down to the femoral bifurcation.  The superficial femoral artery, profunda femoral artery, and external iliac artery were dissected out circumferentially vessel loops applied.  Circumflex branches were also dissected and controlled with silk ties.  The common femoral artery was noted to be mildly diseased. An approximate 3 cm segment of right great saphenous vein was the harvested from the groin incision at the saphenofemoral junction. This was ligated proximally and distally with silk suture ligature and the vein was placed in warm heparinized saline.   At this point, attention was turned to the distal target.  An longitudinal incision was made one finger-width posterior to the tibia.  Using electrocautery and blunt dissection, a plane was developed through the subcutaneous tissue and fascia down to the popliteal space.  The gastrocnemius was retracted posteriorly and the soleus was taken down off of the tibia with meticulous cautery and sharp dissection. The peroneal artery was identified and found on exam to be calcified.  The anterior aspect of the  mid peroneal artery was dissected for a 4cm segment.  At this point, I bluntly dissected a subfascial space between the femoral condyles.  I bluntly passed the long  Gore metal tunneler between the femoral condyles subfascially to the groin incision.  The bullet on the tunneler was removed and then the conduit was then passed through the metal tunnel, taking care to maintain the orientation without twisting the graft.    The patient was then systemically heparinized and after waiting three minutes, the external iliac, profunda and superficial femoral arteries were clamped.  An arteriotomy was made with an 11 blade and extended proximally and distally with a 5mm hole punch.  The proximal end of the conduit was spatulated to the dimensions of the arteriotomy.  The conduit was sewn end-to-side to the CFA with a running stitch of 6-0 prolene.  All vessels were forward flushed and backbled and upon completion of the anastomosis there was pulsatile flow through the bypass.  Attention was then turned back to the tibial exposure.  An Esmarch was used to exsanguinate the leg and a tourniquet was placed on the mid thigh and inflated to 300 mmHg. I reset the exposure and an arteriotomy in the peroneal artery was made with an 11-blade and extended proximally and distally with a Potts scissor.  The 3cm portion of harvest GSV was then used a vein patch. The vein was spatuated and trimmed to match the arteriotomy. This was fashioned to the peroneal artery with 6-0 prolene in a running fashion. The conduit was then pulled to the appropriate tension and length, taking into account straightening of the leg. An arterioromy was then made in the vein patch and extended proximally and distally with potts scissors with care not to cross the suture line. The graft was then fashioned end-to-side with a running stitch of 6-0 prolene.  Prior to tying down the suture the tourniquet was deflated and all vessels were forward flushed and backbled and the anastomosis was completed.  At this point, all incisions were washed out and a hemostatic agent was placed around both anastomoses.  Continuous doppler  examination demonstrated excellent augmentation of the peroneal artery in the wound at the ankle with the bypass open versus clamped.  All incisions were copious irrigated and inspected for hemostasis which was achieved with electrocautery, vascular clips and suture ligature.  The lower leg incision was closed in layers with running 2-0 Vicryl to re-approximate the soleus to the tibia, 3-0 Vicryl for the fascia and a 4-0 Monocryl for the skin.  The groin incision was closed in layers with 2-0 and 3-0 Vicryl and 4-0 Monocryl for the skin. Dermabond was then applied to the incisions.   Patient tolerated the procedure well was brought to PACU in stable condition.      COMPLICATIONS: none apparent   CONDITION: stable  Philipp Brawn MD [facility] Phone Number: [phone] 11/22/2023 12:44 PM

## 2023-11-22 NOTE — Progress Notes (Signed)
 PT Cancellation Note  Patient Details Name: DANTE COOTER MRN: [id number] DOB: May 23, 1946   Cancelled Treatment:    Reason Eval/Treat Not Completed: Patient at procedure or test/unavailable, pt off unit in vascular sx. Will check back as schedule allows to continue with PT POC.  Beverly Buckler. PTA Acute Rehabilitation Services Office: [phone]    Agapito Horseman 11/22/2023, 10:29 AM

## 2023-11-22 NOTE — Progress Notes (Signed)
 [facility] Progress Note Patient: Catherine Ramsey WUJ:[id number] DOB: Nov 23, 1945 DOA: 11/15/2023  DOS: the patient was seen and examined on 11/22/2023  Brief Hospital Course: Catherine Ramsey is a 78 y.o. female with medical history significant for PVD s/p LLE ATA laser atherectomy and balloon angioplasty (10/15/2021), T2DM, CKD stage IIIb, HTN, HLD who is admitted with acute on chronic critical limb ischemia of the right lower extremity. Vascular surgery was consulted. 4/16 underwent angiography and tPA therapy. 4/17 repeat angiogram, unsuccessful recanalization of right TP trunk. 4/18 transferred to floor. 4/22 underwent right common femoral to popliteal artery bypass with PTFE and vein graft.  Assessment and Plan: Acute on chronic critical limb ischemia right lower extremity History of intervention to the left anterior tibial artery 2023 by IR: CTA runoff study shows acute occlusive thrombus of the right popliteal artery behind the knee, moderate to severe stenosis above the knee. Vascular surgery was consulted. Underwent angiography on 4/16, treated with tPA for below popliteal stenosis. On 4/17 repeat angiography, remaining occlusion appeared to have chronic component and was not amenable to intervention. Continued to have rest pain. Vascular surgery recommended and patient underwent right femoral-peroneal bypass on 4/22. Medications adjusted for pain. Postop management per vascular surgery.   Normocytic anemia: Baseline hemoglobin appears to be around 11. Hemoglobin postprocedure around 9.  Monitor.  No active bleeding.   Type 2 diabetes melitis uncontrolled with hyperglycemia without long-term insulin  use. Hold home meds and placed on SSI.  On Lantus  at home. Hemoglobin A1c 9.8.  Will require better diabetes control on discharge.   CKD stage IIIb: Renal function stable, continue to monitor. At risk for worsening after angiography.  Monitor closely.    Hypertension: Continue amlodipine ,  Hold losartan , HCTZ.   Hyperlipidemia: Resume rosuvastatin  and Zetia .   Subjective:  seen after the procedure.  Continues to have some pain.  Patient noted.  No fever no chills.  Physical Exam: General: in Mild distress, No Rash Cardiovascular: S1 and S2 Present, No Murmur Respiratory: Good respiratory effort, Bilateral Air entry present. No Crackles, No wheezes Abdomen: Bowel Sound present, No tenderness Extremities: No edema Neuro: Alert and oriented to self only, no new focal deficit  Data Reviewed: I have Reviewed nursing notes, Vitals, and Lab results. Since last encounter, pertinent lab results CBC and BMP   . I have ordered test including CBC and BMP  .   Disposition: Status is: Inpatient Remains inpatient appropriate because: Monitor for postop recovery. Family Communication: Husband at bedside Level of care: Telemetry Cardiac   Vitals:   11/22/23 1535 11/22/23 1545 11/22/23 1600 11/22/23 1700  BP: (!) 149/72 (!) 151/68 (!) 153/74 123/62  Pulse:  80 78 73  Resp: 12 17 10 11   Temp:      TempSrc:      SpO2: 95% 99%  100%  Weight:      Height:         Author: Charlean Congress, MD 11/22/2023 5:53 PM  Please look on [email or web address] to find out who is on call.

## 2023-11-22 NOTE — Progress Notes (Signed)
      Right groin without hamartoma Right medial LE incision intact  Peroneal signal by doppler intact  Confused  S/P Right common femoral to peroneal artery bypass with PTFE and vein cuff Stable post op    Rocky Cipro PA-C

## 2023-11-22 NOTE — TOC Initial Note (Signed)
 Transition of Care [facility]) - Initial/Assessment Note    Patient Details  Name: Catherine Ramsey MRN: [id number] Date of Birth: 1946/05/31  Transition of Care [facility]) CM/SW Contact:    Omie Bickers, RN Phone Number: 11/22/2023, 2:40 PM  Clinical Narrative:                  Stopped by patient's room, she was in procedure, spoke w her spouse.  He states that they will return home at DC, she will likely need RW and BSC. Dis cussed HH recs and he deferred to her, TOC will need to follow up once she has returned to room.   Expected Discharge Plan: Home w Home Health Services Barriers to Discharge: Continued Medical Work up   Patient Goals and CMS Choice Patient states their goals for this hospitalization and ongoing recovery are:: to go home          Expected Discharge Plan and Services   Discharge Planning Services: CM Consult   Living arrangements for the past 2 months: Single Family Home                                      Prior Living Arrangements/Services Living arrangements for the past 2 months: Single Family Home Lives with:: Spouse                   Activities of Daily Living   ADL Screening (condition at time of admission) Independently performs ADLs?: Yes (appropriate for developmental age) Is the patient deaf or have difficulty hearing?: Yes Does the patient have difficulty seeing, even when wearing glasses/contacts?: No Does the patient have difficulty concentrating, remembering, or making decisions?: No  Permission Sought/Granted                  Emotional Assessment              Admission diagnosis:  Critical limb ischemia of right lower extremity (HCC) [I70.221] Acute lower limb ischemia [I99.8] Patient Active Problem List   Diagnosis Date Noted   Critical limb ischemia of right lower extremity (HCC) 11/15/2023   Chronic kidney disease, stage 3b (HCC) 11/15/2023   Normocytic anemia 11/15/2023   Paronychia of great toe, left  10/01/2022   Callus 10/01/2022   COVID-19 08/18/2021   Neck pain 08/18/2021   Strain of right trapezius muscle 08/18/2021   Peripheral arterial disease (HCC) 05/05/2021   Hyperlipidemia associated with type 2 diabetes mellitus (HCC) 06/22/2019   Type 2 diabetes mellitus with obesity (HCC) 06/22/2019   Diabetes mellitus due to underlying condition, uncontrolled, with hyperglycemia (HCC)    Hypertension associated with diabetes (HCC) 02/25/2019   Long term (current) use of insulin  (HCC) 11/30/2018   Cellulitis of toe 09/22/2018   Primary localized osteoarthritis of pelvic region and thigh 06/28/2018   Localized edema 04/21/2017   Encounter for general adult medical examination without abnormal findings 04/29/2016   Pain in left knee 07/07/2015   Hyperglycemia due to type 2 diabetes mellitus (HCC) 03/20/2015   Mononeuropathy 03/20/2015   Other specified disorders of synovium and tendon, unspecified site 09/11/2012   Obesity 08/04/2011   Psychophysiologic insomnia 06/08/2010   Insulin  dependent diabetes mellitus 05/08/2010   Achalasia - Type II 05/08/2010   History of colonic polyps 05/08/2010   Low back pain 03/05/2010   Gastro-esophageal reflux disease without esophagitis 12/01/2009   Osteoarthritis 12/01/2009   PCP:  Bertha Broad, MD Pharmacy:   [facility] [phone] [place], [place] - [address] [place] [address] Phone: [phone] Fax: [phone]  [facility] - [place], [place] - [address] [place] [address] Phone: [phone] Fax: [phone]     Social Drivers of Health (SDOH) Social History: SDOH Screenings   Food Insecurity: No Food Insecurity (11/15/2023)  Housing: Low Risk  (11/15/2023)  Transportation Needs: No Transportation Needs (11/15/2023)  Utilities: Not At Risk (11/15/2023)  Social Connections: Socially Integrated (11/15/2023)  Tobacco Use: Low Risk   (11/15/2023)   SDOH Interventions:     Readmission Risk Interventions     No data to display

## 2023-11-22 NOTE — Progress Notes (Signed)
 Pt removed rings (5) and earrings. Placed in clear plastic bag and gave to husband. Husband has purse and other belongings. Short stay called and report given to RN. All questions answered. Benna Brasher, RN

## 2023-11-22 NOTE — Anesthesia Procedure Notes (Addendum)
 Arterial Line Insertion Start/End4/22/2025 10:15 AM, 11/22/2023 10:20 AM Performed by: Grace Laura, MD, Jamas Maywood, CRNA, anesthesiologist  Patient location: Pre-op. Preanesthetic checklist: patient identified, IV checked, site marked, risks and benefits discussed, surgical consent, monitors and equipment checked, pre-op evaluation, timeout performed and anesthesia consent Lidocaine  1% used for infiltration Left, radial was placed Catheter size: 20 G Hand hygiene performed , maximum sterile barriers used  and Seldinger technique used Allen's test indicative of satisfactory collateral circulation Attempts: 1 Procedure performed using ultrasound guided technique. Ultrasound Notes:anatomy identified, needle tip was noted to be adjacent to the nerve/plexus identified and no ultrasound evidence of intravascular and/or intraneural injection Following insertion, dressing applied. Post procedure assessment: normal and unchanged  Patient tolerated the procedure well with no immediate complications.

## 2023-11-22 NOTE — Anesthesia Procedure Notes (Signed)
 Procedure Name: Intubation Date/Time: 11/22/2023 10:13 AM  Performed by: Jamas Maywood, CRNAPre-anesthesia Checklist: Patient identified, Emergency Drugs available, Suction available and Patient being monitored Patient Re-evaluated:Patient Re-evaluated prior to induction Oxygen Delivery Method: Circle system utilized Preoxygenation: Pre-oxygenation with 100% oxygen Induction Type: IV induction Ventilation: Mask ventilation without difficulty Laryngoscope Size: Mac and 4 Grade View: Grade I Tube type: Oral Tube size: 7.0 mm Number of attempts: 1 Airway Equipment and Method: Stylet and Oral airway Placement Confirmation: ETT inserted through vocal cords under direct vision, positive ETCO2 and breath sounds checked- equal and bilateral Secured at: 22 cm Tube secured with: Tape Dental Injury: Teeth and Oropharynx as per pre-operative assessment

## 2023-11-22 NOTE — Anesthesia Preprocedure Evaluation (Addendum)
 Anesthesia Evaluation  Patient identified by MRN, date of birth, ID band Patient awake    Reviewed: Allergy & Precautions, NPO status , Patient's Chart, lab work & pertinent test results  Airway Mallampati: I  TM Distance: >3 FB Neck ROM: Full    Dental  (+) Edentulous Upper, Chipped, Missing, Dental Advisory Given,    Pulmonary neg pulmonary ROS   Pulmonary exam normal breath sounds clear to auscultation       Cardiovascular hypertension, Pt. on medications + Peripheral Vascular Disease  Normal cardiovascular exam Rhythm:Regular Rate:Normal  TTE 2020  1. The left ventricle has normal systolic function with an ejection  fraction of 60-65%. The cavity size was normal. Left ventricular diastolic  Doppler parameters are consistent with impaired relaxation.   2. The right ventricle has normal systolic function. The cavity was  normal. There is no increase in right ventricular wall thickness.   3. Left atrial size was moderately dilated.     Neuro/Psych negative neurological ROS  negative psych ROS   GI/Hepatic Neg liver ROS,GERD  ,,  Endo/Other  diabetes, Type 2, Insulin  Dependent    Renal/GU Renal InsufficiencyRenal disease  negative genitourinary   Musculoskeletal  (+) Arthritis ,    Abdominal   Peds  Hematology  (+) Blood dyscrasia, anemia Lab Results      Component                Value               Date                      WBC                      9.0                 11/22/2023                HGB                      9.2 (L)             11/22/2023                HCT                      28.7 (L)            11/22/2023                MCV                      86.7                11/22/2023                PLT                      155                 11/22/2023              Anesthesia Other Findings   Reproductive/Obstetrics                             Anesthesia Physical Anesthesia  Plan  ASA: 3  Anesthesia Plan: General   Post-op Pain Management: Tylenol  PO (  pre-op)*   Induction: Intravenous  PONV Risk Score and Plan: 3 and Dexamethasone , Ondansetron  and Treatment may vary due to age or medical condition  Airway Management Planned: Oral ETT  Additional Equipment: Arterial line  Intra-op Plan:   Post-operative Plan: Extubation in OR  Informed Consent: I have reviewed the patients History and Physical, chart, labs and discussed the procedure including the risks, benefits and alternatives for the proposed anesthesia with the patient or authorized representative who has indicated his/her understanding and acceptance.     Dental advisory given  Plan Discussed with: CRNA  Anesthesia Plan Comments:        Anesthesia Quick Evaluation

## 2023-11-22 NOTE — Plan of Care (Signed)
  Problem: Education: Goal: Knowledge of General Education information will improve Description: Including pain rating scale, medication(s)/side effects and non-pharmacologic comfort measures Outcome: Progressing   Problem: Clinical Measurements: Goal: Ability to maintain clinical measurements within normal limits will improve Outcome: Progressing Goal: Diagnostic test results will improve Outcome: Progressing. Dopplered DP and DT pulses bilaterally. Surgical incisions clean and dry.

## 2023-11-23 ENCOUNTER — Telehealth (HOSPITAL_COMMUNITY): Payer: Self-pay | Admitting: Pharmacy Technician

## 2023-11-23 ENCOUNTER — Encounter (HOSPITAL_COMMUNITY): Payer: Self-pay | Admitting: Vascular Surgery

## 2023-11-23 ENCOUNTER — Other Ambulatory Visit (HOSPITAL_COMMUNITY): Payer: Self-pay

## 2023-11-23 DIAGNOSIS — I70221 Atherosclerosis of native arteries of extremities with rest pain, right leg: Secondary | ICD-10-CM | POA: Diagnosis not present

## 2023-11-23 LAB — GLUCOSE, CAPILLARY
Glucose-Capillary: 184 mg/dL — ABNORMAL HIGH (ref 70–99)
Glucose-Capillary: 344 mg/dL — ABNORMAL HIGH (ref 70–99)
Glucose-Capillary: 299 mg/dL — ABNORMAL HIGH (ref 70–99)
Glucose-Capillary: 225 mg/dL — ABNORMAL HIGH (ref 70–99)

## 2023-11-23 LAB — CBC
HCT: 25.9 % — ABNORMAL LOW (ref 36.0–46.0)
HCT: 24.1 % — ABNORMAL LOW (ref 36.0–46.0)
Hemoglobin: 8.4 g/dL — ABNORMAL LOW (ref 12.0–15.0)
Hemoglobin: 7.8 g/dL — ABNORMAL LOW (ref 12.0–15.0)
MCH: 28.1 pg (ref 26.0–34.0)
MCH: 28 pg (ref 26.0–34.0)
MCHC: 32.4 g/dL (ref 30.0–36.0)
MCHC: 32.4 g/dL (ref 30.0–36.0)
MCV: 86.6 fL (ref 80.0–100.0)
MCV: 86.4 fL (ref 80.0–100.0)
Platelets: 136 10*3/uL — ABNORMAL LOW (ref 150–400)
Platelets: 160 10*3/uL (ref 150–400)
RBC: 2.99 MIL/uL — ABNORMAL LOW (ref 3.87–5.11)
RBC: 2.79 MIL/uL — ABNORMAL LOW (ref 3.87–5.11)
RDW: 13.2 % (ref 11.5–15.5)
RDW: 13.3 % (ref 11.5–15.5)
WBC: 11.1 10*3/uL — ABNORMAL HIGH (ref 4.0–10.5)
WBC: 15.1 10*3/uL — ABNORMAL HIGH (ref 4.0–10.5)
nRBC: 0 % (ref 0.0–0.2)
nRBC: 0.1 % (ref 0.0–0.2)

## 2023-11-23 LAB — BASIC METABOLIC PANEL WITH GFR
Anion gap: 10 (ref 5–15)
BUN: 16 mg/dL (ref 8–23)
CO2: 22 mmol/L (ref 22–32)
Calcium: 8.4 mg/dL — ABNORMAL LOW (ref 8.9–10.3)
Chloride: 106 mmol/L (ref 98–111)
Creatinine, Ser: 1.13 mg/dL — ABNORMAL HIGH (ref 0.44–1.00)
GFR, Estimated: 50 mL/min — ABNORMAL LOW (ref >60)
Glucose, Bld: 154 mg/dL — ABNORMAL HIGH (ref 70–99)
Potassium: 4.1 mmol/L (ref 3.5–5.1)
Sodium: 138 mmol/L (ref 135–145)

## 2023-11-23 LAB — HEPARIN LEVEL (UNFRACTIONATED)
Heparin Unfractionated: >1.1 [IU]/mL — ABNORMAL HIGH (ref 0.30–0.70)
Heparin Unfractionated: 0.82 [IU]/mL — ABNORMAL HIGH (ref 0.30–0.70)

## 2023-11-23 MED ORDER — METHOCARBAMOL 500 MG PO TABS
500.0000 mg | ORAL_TABLET | Freq: Three times a day (TID) | ORAL | Status: DC | PRN
  Administered 2023-11-28: 500 mg via ORAL
  Filled 2023-11-23: qty 1

## 2023-11-23 MED ORDER — HEPARIN (PORCINE) 25000 UT/250ML-% IV SOLN
950.0000 [IU]/h | INTRAVENOUS | Status: DC
Start: 2023-11-23 — End: 2023-11-24
  Administered 2023-11-23: 1100 [IU]/h via INTRAVENOUS

## 2023-11-23 MED ORDER — INSULIN GLARGINE-YFGN 100 UNIT/ML ~~LOC~~ SOLN
15.0000 [IU] | Freq: Every day | SUBCUTANEOUS | Status: DC
  Administered 2023-11-23 – 2023-11-28 (×6): 15 [IU] via SUBCUTANEOUS
  Filled 2023-11-23 (×6): qty 0.15

## 2023-11-23 NOTE — Inpatient Diabetes Management (Signed)
 Inpatient Diabetes Program Recommendations  AACE/ADA: New Consensus Statement on Inpatient Glycemic Control (2015)  Target Ranges:  Prepandial:   less than 140 mg/dL      Peak postprandial:   less than 180 mg/dL (1-2 hours)      Critically ill patients:  140 - 180 mg/dL   Lab Results  Component Value Date   GLUCAP 344 (H) 11/23/2023   HGBA1C 9.8 (H) 11/16/2023    Review of Glycemic Control  Latest Reference Range & Units 11/22/23 08:19 11/22/23 13:09 11/22/23 16:01 11/22/23 20:56 11/23/23 06:00 11/23/23 10:55  Glucose-Capillary 70 - 99 mg/dL 951 (H) 884 (H) 166 (H) 174 (H) 184 (H) 344 (H)   Diabetes history: DM 2 Outpatient Diabetes medications: Novolog  10 units TID, Lantus  19 units QAM, Jardiance 10 mg daily (not taking; stopped due to UTIs) Current orders for Inpatient glycemic control: Novolog  0-9 units TID with meals, Novolog  0-5 units QHS   Inpatient Diabetes Program Recommendations:    Please restart basal insulin .  Consider adding Semglee  15 units daily.    Thanks,  Josefa Ni, RN, BC-ADM Inpatient Diabetes Coordinator Pager [phone]  (8a-5p)  Discharge Recommendations: Other recommendations: Freestyle Libre 3 sensors (order # [id number]) Long acting recommendations: Insulin  Glargine (LANTUS ) Solostar Pen dosage prescribed by physician  Supply/Referral recommendations: Pen needles - standard   Use Adult Diabetes Insulin  Treatment Post Discharge order set.

## 2023-11-23 NOTE — Telephone Encounter (Signed)

## 2023-11-23 NOTE — Progress Notes (Signed)
 PHARMACY - ANTICOAGULATION CONSULT NOTE  Pharmacy Consult for heparin  Indication: leg ischemia  Allergies  Allergen Reactions   Lovastatin Itching and Rash   Nitrofurantoin Rash    Patient Measurements: Height: (P) 5\' 7"  (170.2 cm) Weight: (P) 83.9 kg (185 lb) IBW/kg (Calculated) : (P) 61.6 HEPARIN  DW (KG): (P) 79.1  Vital Signs: Temp: 98.9 F (37.2 C) (04/23 0253) Temp Source: Axillary (04/23 0253) BP: 117/58 (04/23 0253) Pulse Rate: 67 (04/23 0253)  Labs: Recent Labs    11/21/23 0414 11/22/23 0512 11/23/23 0419  HGB 9.0* 9.2* 8.4*  HCT 28.0* 28.7* 25.9*  PLT 125* 155 136*  HEPARINUNFRC 0.86* >1.10* >1.10*  CREATININE 1.03* 1.02* 1.13*    Estimated Creatinine Clearance: 47.6 mL/min (A) (by C-G formula based on SCr of 1.13 mg/dL (H)).  Scheduled:   Assessment: 63 YOF presented with numbness in the right foot, found to have acute on chronic limb ischemia. PMH significant for HLD and PAD. Patient not on anticoagulation prior to hospitalization. Pharmacy consulted for heparin  dosing.   S/p repeat bypass 4/22, HL elevated prior to being turned off for vascular procedure.  Heparin  level elevated this AM - appears to have been drawn from the opposite arm correctly, no bleeding noted.  Goal of Therapy:  Heparin  level 0.3-0.7 units/ml Monitor platelets by anticoagulation protocol: Yes   Plan:  Hold heparin  x 1 hour then decrease to 1100 units / hr 8 hour heparin  level Monitor daily CBC and heparin  level  Follow-up switching to DOAC when appropriate - 4/24?  Thank you. [name], PharmD 11/23/2023 8:21 AM

## 2023-11-23 NOTE — Progress Notes (Addendum)
  Progress Note    11/23/2023 6:37 AM 1 Day Post-Op  Subjective:  says her foot feels better  Afebrile   Vitals:   11/23/23 0000 11/23/23 0253  BP: 115/66 (!) 117/58  Pulse: 90 67  Resp: 11 11  Temp:  98.9 F (37.2 C)  SpO2: 98% 100%    Physical Exam: General:  no distress Cardiac:  regular Lungs:  non labored Incisions:  right groin and right BK incisions look good Extremities:  brisk doppler flow right peroneal; right foot is warm; right calf and anterior compartments soft and non tender Abdomen:  soft  CBC    Component Value Date/Time   WBC 11.1 (H) 11/23/2023 0419   RBC 2.99 (L) 11/23/2023 0419   HGB 8.4 (L) 11/23/2023 0419   HCT 25.9 (L) 11/23/2023 0419   PLT 136 (L) 11/23/2023 0419   MCV 86.6 11/23/2023 0419   MCH 28.1 11/23/2023 0419   MCHC 32.4 11/23/2023 0419   RDW 13.2 11/23/2023 0419   LYMPHSABS 3.2 11/15/2023 1546   MONOABS 0.5 11/15/2023 1546   EOSABS 0.2 11/15/2023 1546   BASOSABS 0.1 11/15/2023 1546    BMET    Component Value Date/Time   NA 138 11/23/2023 0419   NA 143 03/26/2019 1403   K 4.1 11/23/2023 0419   CL 106 11/23/2023 0419   CO2 22 11/23/2023 0419   GLUCOSE 154 (H) 11/23/2023 0419   BUN 16 11/23/2023 0419   BUN 11 03/26/2019 1403   CREATININE 1.13 (H) 11/23/2023 0419   CALCIUM  8.4 (L) 11/23/2023 0419   GFRNONAA 50 (L) 11/23/2023 0419   GFRAA 56 (L) 03/26/2019 1403    INR    Component Value Date/Time   INR 1.2 11/15/2023 1546     Intake/Output Summary (Last 24 hours) at 11/23/2023 4098 Last data filed at 11/22/2023 1430 Gross per 24 hour  Intake 1950 ml  Output 950 ml  Net 1000 ml      Assessment/Plan:  78 y.o. female is s/p:  Right common femoral to peroneal artery bypass with PTFE and vein cuff for rest pain  1 Day Post-Op   -pt with brisk doppler flow right peroneal.  Compartments are soft and non tender.  Incisions look good.  Discussed with her and family member about groin wound care and keeping it  clean and dry unless in the shower.  -increase mobilization today -DVT prophylaxis:  heparin  gtt -continue asa/statin   Maryanna Smart, PA-C [facility] [phone] 11/23/2023 6:37 AM  VASCULAR STAFF ADDENDUM: I have independently interviewed and examined the patient. I agree with the above.  Incisions healing well, no signs of hematoma. Multiphasic Peroneal and DP signals, monophasic PT. Continue heparin  gtt, transition to DOAC tomorrow. Plan for long-term aspirin  + DOAC Ambulate today  Philipp Brawn MD [facility] Phone Number: [phone] 11/23/2023 7:50 AM

## 2023-11-23 NOTE — Progress Notes (Signed)
 Occupational Therapy Treatment Patient Details Name: Catherine Ramsey MRN: [id number] DOB: [birth year] Today's Date: 11/23/2023   History of present illness 78 y.o. female presents to [facility]23 for R LE pain and numbness. Pt admitted with R LE critical ischemia and moderate LE arterial disease. 4/16 R LE angiogram w/ thrombolysis. 4/17 repeat angiogram w/ unsuccessful recanalization of R TP trunk. 4/22 R fem bypass with PTFE and vein cuff. PMHx: PVD s/p LLE ATA laser atherectomy and balloon angioplasty (10/15/2021), T2DM, CKD stage IIIb, HTN, HLD, R THA   OT comments  Pt with R LE post surgical pain and generalized weakness. Reports having a reacher and sock aide at home. Recommended long handled bath sponge for reaching feet when showering. Educated in multiple uses of 3 in 1 for home and to use walk in shower instead of tub. Daughter in room and also verbalizing understanding. Pt stood and transferred to [facility] in attempt to have BM with min assist and RW. Continue to recommend HHOT.      If plan is discharge home, recommend the following:  A little help with walking and/or transfers;A little help with bathing/dressing/bathroom;Assistance with cooking/housework;Assist for transportation;Help with stairs or ramp for entrance   Equipment Recommendations  BSC/3in1    Recommendations for Other Services      Precautions / Restrictions Precautions Precautions: Fall Restrictions Weight Bearing Restrictions Per Provider Order: No       Mobility Bed Mobility               General bed mobility comments: received in chair    Transfers Overall transfer level: Needs assistance Equipment used: Rolling walker (2 wheels) Transfers: Sit to/from Stand, Bed to chair/wheelchair/BSC Sit to Stand: Min assist     Step pivot transfers: Min assist     General transfer comment: assist to rise and steady     Balance Overall balance assessment: Needs assistance, Mild deficits observed, not  formally tested   Sitting balance-Leahy Scale: Fair     Standing balance support: Bilateral upper extremity supported, During functional activity, Reliant on assistive device for balance Standing balance-Leahy Scale: Fair Standing balance comment: able to stand statically with no AD, RW for gait                           ADL either performed or assessed with clinical judgement   ADL Overall ADL's : Needs assistance/impaired Eating/Feeding: Set up;Sitting   Grooming: Wash/dry hands;Contact guard assist;Standing           Upper Body Dressing : Set up;Sitting   Lower Body Dressing: Total assistance;Sitting/lateral leans Lower Body Dressing Details (indicate cue type and reason): assist for socks, husband helped at home Toilet Transfer: Minimal assistance;Stand-pivot;BSC/3in1;Rolling walker (2 wheels)   Toileting- Clothing Manipulation and Hygiene: Set up;Sitting/lateral lean              Extremity/Trunk Assessment              Vision       Perception     Praxis     Communication Communication Communication: No apparent difficulties   Cognition Arousal: Alert Behavior During Therapy: WFL for tasks assessed/performed Cognition: Cognition impaired     Awareness: Online awareness impaired, Intellectual awareness intact Memory impairment (select all impairments): Short-term memory Attention impairment (select first level of impairment): Selective attention Executive functioning impairment (select all impairments): Organization, Problem solving OT - Cognition Comments: follows one step commands with increased time. Tangential at  times and is easily distracted                 Following commands: Impaired Following commands impaired: Follows one step commands with increased time      Cueing   Cueing Techniques: Verbal cues, Tactile cues  Exercises      Shoulder Instructions       General Comments Incisions dry and intact     Pertinent Vitals/ Pain       Pain Assessment Pain Assessment: Faces Faces Pain Scale: Hurts even more Pain Location: R LE Pain Descriptors / Indicators: Dull, Discomfort Pain Intervention(s): Monitored during session, Repositioned  Home Living                                          Prior Functioning/Environment              Frequency  Min 2X/week        Progress Toward Goals  OT Goals(current goals can now be found in the care plan section)  Progress towards OT goals: Progressing toward goals  Acute Rehab OT Goals OT Goal Formulation: With patient Time For Goal Achievement: 12/05/23 Potential to Achieve Goals: Good  Plan      Co-evaluation                 AM-PAC OT "6 Clicks" Daily Activity     Outcome Measure   Help from another person eating meals?: A Little Help from another person taking care of personal grooming?: A Little Help from another person toileting, which includes using toliet, bedpan, or urinal?: A Little Help from another person bathing (including washing, rinsing, drying)?: A Lot Help from another person to put on and taking off regular upper body clothing?: A Little Help from another person to put on and taking off regular lower body clothing?: A Lot 6 Click Score: 16    End of Session    OT Visit Diagnosis: Unsteadiness on feet (R26.81);Muscle weakness (generalized) (M62.81);Pain Pain - Right/Left: Right Pain - part of body: Leg;Ankle and joints of foot   Activity Tolerance Patient tolerated treatment well   Patient Left in chair;with call bell/phone within reach;with family/visitor present   Nurse Communication          Time: 5784-6962 OT Time Calculation (min): 18 min  Charges: OT General Charges $OT Visit: 1 Visit OT Treatments $Self Care/Home Management : 8-22 mins  Avanell Leigh, OTR/L Acute Rehabilitation Services Office: [phone]   Jonette Nestle 11/23/2023, 11:07 AM

## 2023-11-23 NOTE — Progress Notes (Signed)
 [facility] Progress Note Patient: Catherine Ramsey HYQ:[id number] DOB: 1945/09/11 DOA: 11/15/2023  DOS: the patient was seen and examined on 11/23/2023  Brief Hospital Course: Catherine Ramsey is a 78 y.o. female with medical history significant for PVD s/p LLE ATA laser atherectomy and balloon angioplasty (10/15/2021), T2DM, CKD stage IIIb, HTN, HLD who is admitted with acute on chronic critical limb ischemia of the right lower extremity. Vascular surgery was consulted. 4/16 underwent angiography and tPA therapy. 4/17 repeat angiogram, unsuccessful recanalization of right TP trunk. 4/18 transferred to floor. 4/22 underwent right common femoral to popliteal artery bypass with PTFE and vein graft.  Assessment and Plan: Acute on chronic critical limb ischemia right lower extremity History of intervention to the left anterior tibial artery 2023 by IR: CTA runoff study shows acute occlusive thrombus of the right popliteal artery behind the knee, moderate to severe stenosis above the knee. Vascular surgery was consulted. Underwent angiography on 4/16, treated with tPA for below popliteal stenosis. On 4/17 repeat angiography, remaining occlusion appeared to have chronic component and was not amenable to intervention. Continued to have rest pain. Vascular surgery recommended and patient underwent right femoral-peroneal bypass on 4/22. Medications adjusted for pain. Postop management per vascular surgery.   Normocytic anemia: Baseline hemoglobin appears to be around 11. Hemoglobin postprocedure around 9.  Monitor.  No active bleeding.   Type 2 diabetes melitis uncontrolled with hyperglycemia without long-term insulin  use. Hold home meds and placed on SSI.  On Lantus  at home.  Resume Lantus  on 4/23. Hemoglobin A1c 9.8.  Will require better diabetes control on discharge.   CKD stage IIIb: Renal function stable, continue to monitor. At risk for worsening after angiography.  Monitor closely.    Hypertension: Continue amlodipine ,  Hold losartan , HCTZ.   Hyperlipidemia: Resume rosuvastatin  and Zetia .   Subjective: No nausea no vomiting.  Pain improving.  No fever no chills.  Physical Exam: General: in Mild distress, No Rash Cardiovascular: S1 and S2 Present, No Murmur Respiratory: Good respiratory effort, Bilateral Air entry present. No Crackles, No wheezes Abdomen: Bowel Sound present, No tenderness Extremities: No edema Neuro: Alert and oriented x3, no new focal deficit  Data Reviewed: I have Reviewed nursing notes, Vitals, and Lab results. Since last encounter, pertinent lab results CBC and BMP   . I have ordered test including CBC and BMP  .   Disposition: Status is: Inpatient Remains inpatient appropriate because: Monitor for improvement in pain control.   Family Communication: Family at bedside Level of care: Telemetry Cardiac   Vitals:   11/23/23 1259 11/23/23 1537 11/23/23 1745 11/23/23 1950  BP: (!) 111/55 (!) 153/63 (!) 149/64 (!) 129/57  Pulse: 86 88  (!) 106  Resp: 16 19  17   Temp: 99.1 F (37.3 C) 98.1 F (36.7 C)  97.9 F (36.6 C)  TempSrc: Oral Oral  Oral  SpO2: 100% 100%  96%  Weight:      Height:         Author: Charlean Congress, MD 11/23/2023 8:11 PM  Please look on [email or web address] to find out who is on call.

## 2023-11-23 NOTE — Plan of Care (Signed)
  Problem: Clinical Measurements: Goal: Diagnostic test results will improve Outcome: Progressing   Problem: Clinical Measurements: Goal: Respiratory complications will improve Outcome: Progressing   Problem: Coping: Goal: Level of anxiety will decrease Outcome: Progressing   Problem: Safety: Goal: Ability to remain free from injury will improve Outcome: Progressing   Problem: Coping: Goal: Ability to adjust to condition or change in health will improve Outcome: Progressing

## 2023-11-23 NOTE — Progress Notes (Signed)
 Physical Therapy Treatment Patient Details Name: Catherine Ramsey MRN: [id number] DOB: 14-Dec-1945 Today's Date: 11/23/2023   History of Present Illness 78 y.o. female presents to [facility] 11/15/23 for R LE pain and numbness. Pt admitted with R LE critical ischemia and moderate LE arterial disease. 4/16 R LE angiogram w/ thrombolysis. 4/17 repeat angiogram w/ unsuccessful recanalization of R TP trunk. 4/22 R fem bypass with PTFE and vein cuff. PMHx: PVD s/p LLE ATA laser atherectomy and balloon angioplasty (10/15/2021), T2DM, CKD stage IIIb, HTN, HLD, R THA    PT Comments  Pt in bed upon arrival with family present and agreeable to PT session. Session limited by pt feeling SOB and lightheaded when moving, vitals below. Pt was able to move from supine/sit with CGA and increased time to move RLE off EOB. Pt was able to perform two stands with MinA for boost-up/steadying assist with RLE slightly anterior. She was able to perform step-pivot transfer with MinA and RW. Pt reported not having much of an appetite and had not eaten anything since the surgery. Encouraged pt to continue eating/drinking water with breakfast tray arriving at end of session. Continue to recommend HHPT pending progress with mobility.  Acute PT to follow.   BP 125/57, 76 BPM 98-100% SpO2 on RA    If plan is discharge home, recommend the following: A little help with walking and/or transfers;A little help with bathing/dressing/bathroom;Assistance with cooking/housework;Assist for transportation;Help with stairs or ramp for entrance   Can travel by private vehicle      Yes  Equipment Recommendations  Rolling walker (2 wheels);BSC/3in1       Precautions / Restrictions Precautions Precautions: Fall Restrictions Weight Bearing Restrictions Per Provider Order: No     Mobility  Bed Mobility Overal bed mobility: Needs Assistance Bed Mobility: Supine to Sit    Supine to sit: Contact guard    General bed mobility comments: CGA for  safety, increased time with moving R LE    Transfers Overall transfer level: Needs assistance Equipment used: Rolling walker (2 wheels) Transfers: Sit to/from Stand, Bed to chair/wheelchair/BSC Sit to Stand: Min assist   Step pivot transfers: Min assist     General transfer comment: MinA for boost-up and steadying. Able to perform ste-pivot transfer and walk forwards/backwards a few steps before needing to sit due to feeling SOB    Ambulation/Gait Ambulation/Gait assistance: Contact guard assist Gait Distance (Feet): 4 Feet Assistive device: Rolling walker (2 wheels) Gait Pattern/deviations: Step-through pattern, Decreased stride length, Narrow base of support, Shuffle, Decreased weight shift to right, Decreased stance time - right Gait velocity: decr    General Gait Details: decreased WB on R LE, slightly unsteady with shortened steps      Balance Overall balance assessment: Needs assistance, Mild deficits observed, not formally tested Sitting-balance support: No upper extremity supported, Feet supported Sitting balance-Leahy Scale: Fair     Standing balance support: Bilateral upper extremity supported, During functional activity, Reliant on assistive device for balance Standing balance-Leahy Scale: Fair Standing balance comment: able to stand statically with no AD, RW for gait       Communication Communication Communication: No apparent difficulties  Cognition Arousal: Alert Behavior During Therapy: WFL for tasks assessed/performed   PT - Cognitive impairments: Problem solving, Sequencing    Following commands: Impaired Following commands impaired: Follows one step commands with increased time    Cueing Cueing Techniques: Verbal cues, Tactile cues     General Comments General comments (skin integrity, edema, etc.): Incisions dry and  intact      Pertinent Vitals/Pain Pain Assessment Pain Assessment: Faces Faces Pain Scale: Hurts little more Pain Location: R  LE Pain Descriptors / Indicators: Dull, Discomfort Pain Intervention(s): Limited activity within patient's tolerance, Monitored during session, Repositioned     PT Goals (current goals can now be found in the care plan section) Acute Rehab PT Goals PT Goal Formulation: With patient Time For Goal Achievement: 12/04/23 Potential to Achieve Goals: Good Progress towards PT goals: Progressing toward goals    Frequency    Min 2X/week       AM-PAC PT "6 Clicks" Mobility   Outcome Measure  Help needed turning from your back to your side while in a flat bed without using bedrails?: A Little Help needed moving from lying on your back to sitting on the side of a flat bed without using bedrails?: A Little Help needed moving to and from a bed to a chair (including a wheelchair)?: A Little Help needed standing up from a chair using your arms (e.g., wheelchair or bedside chair)?: A Little Help needed to walk in hospital room?: A Little Help needed climbing 3-5 steps with a railing? : A Little 6 Click Score: 18    End of Session Equipment Utilized During Treatment: Gait belt Activity Tolerance: Other (comment) (felt SOB and lightheaded) Patient left: in chair;with call bell/phone within reach;with family/visitor present Nurse Communication: Mobility status;Other (comment) (symptoms) PT Visit Diagnosis: Other abnormalities of gait and mobility (R26.89);Muscle weakness (generalized) (M62.81)     Time: 4540-9811 PT Time Calculation (min) (ACUTE ONLY): 24 min  Charges:    $Therapeutic Activity: 23-37 mins PT General Charges $$ ACUTE PT VISIT: 1 Visit                    Catherine Ramsey, PT, DPT Secure Chat Preferred  Rehab Office [phone]   Catherine Ramsey 11/23/2023, 8:58 AM

## 2023-11-23 NOTE — Anesthesia Postprocedure Evaluation (Signed)
 Anesthesia Post Note  Patient: Catherine Ramsey  Procedure(s) Performed: CREATION, BYPASS, ARTERIAL, FEMORAL TO PERONEAL, USING PROPATEN X 80CM GRAFT (Right: Leg Lower)     Patient location during evaluation: PACU Anesthesia Type: General Level of consciousness: awake and alert Pain management: pain level controlled Vital Signs Assessment: post-procedure vital signs reviewed and stable Respiratory status: spontaneous breathing, nonlabored ventilation, respiratory function stable and patient connected to nasal cannula oxygen Cardiovascular status: blood pressure returned to baseline and stable Postop Assessment: no apparent nausea or vomiting Anesthetic complications: no  There were no known notable events for this encounter.  Last Vitals:  Vitals:   11/23/23 0000 11/23/23 0253  BP: 115/66 (!) 117/58  Pulse: 90 67  Resp: 11 11  Temp:  37.2 C  SpO2: 98% 100%    Last Pain:  Vitals:   11/23/23 0500  TempSrc:   PainSc: Asleep   Pain Goal: Patients Stated Pain Goal: 0 (11/23/23 0400)                 [name] [name]

## 2023-11-23 NOTE — Progress Notes (Signed)
 PHARMACY - ANTICOAGULATION CONSULT NOTE  Pharmacy Consult for heparin  Indication: leg ischemia  Allergies  Allergen Reactions   Lovastatin Itching and Rash   Nitrofurantoin Rash    Patient Measurements: Height: (P) 5\' 7"  (170.2 cm) Weight: (P) 83.9 kg (185 lb) IBW/kg (Calculated) : (P) 61.6 HEPARIN  DW (KG): (P) 79.1  Vital Signs: Temp: 97.9 F (36.6 C) (04/23 1950) Temp Source: Oral (04/23 1950) BP: 129/57 (04/23 1950) Pulse Rate: 106 (04/23 1950)  Labs: Recent Labs    11/21/23 0414 11/22/23 0512 11/23/23 0419 11/23/23 2118  HGB 9.0* 9.2* 8.4* 7.8*  HCT 28.0* 28.7* 25.9* 24.1*  PLT 125* 155 136* 160  HEPARINUNFRC 0.86* >1.10* >1.10* 0.82*  CREATININE 1.03* 1.02* 1.13*  --     Estimated Creatinine Clearance: 47.6 mL/min (A) (by C-G formula based on SCr of 1.13 mg/dL (H)).  Scheduled:   Assessment: 60 YOF presented with numbness in the right foot, found to have acute on chronic limb ischemia. PMH significant for HLD and PAD. Patient not on anticoagulation prior to hospitalization. Pharmacy consulted for heparin  dosing.   Heparin  level remains slightly elevated (0.81) but coming down on infusion at 1100 units/hr. Appears to have been drawn from the opposite arm correctly, no bleeding noted.  Goal of Therapy:  Heparin  level 0.3-0.7 units/ml Monitor platelets by anticoagulation protocol: Yes   Plan:  Decrease heparin  infusion to 950 units / hr 8 hour heparin  level  Enrigue Harvard, PharmD, BCPS Please see amion for complete clinical pharmacist phone list 11/23/2023 10:20 PM

## 2023-11-24 DIAGNOSIS — N1831 Chronic kidney disease, stage 3a: Secondary | ICD-10-CM

## 2023-11-24 DIAGNOSIS — I70221 Atherosclerosis of native arteries of extremities with rest pain, right leg: Secondary | ICD-10-CM | POA: Diagnosis not present

## 2023-11-24 LAB — BASIC METABOLIC PANEL WITH GFR
Anion gap: 10 (ref 5–15)
BUN: 20 mg/dL (ref 8–23)
CO2: 22 mmol/L (ref 22–32)
Calcium: 8.2 mg/dL — ABNORMAL LOW (ref 8.9–10.3)
Chloride: 103 mmol/L (ref 98–111)
Creatinine, Ser: 1.14 mg/dL — ABNORMAL HIGH (ref 0.44–1.00)
GFR, Estimated: 50 mL/min — ABNORMAL LOW (ref >60)
Glucose, Bld: 143 mg/dL — ABNORMAL HIGH (ref 70–99)
Potassium: 3.9 mmol/L (ref 3.5–5.1)
Sodium: 135 mmol/L (ref 135–145)

## 2023-11-24 LAB — TYPE AND SCREEN
ABO/RH(D): B POS
Antibody Screen: NEGATIVE
Unit division: 0

## 2023-11-24 LAB — CBC
HCT: 19.9 % — ABNORMAL LOW (ref 36.0–46.0)
Hemoglobin: 6.5 g/dL — CL (ref 12.0–15.0)
MCH: 27.9 pg (ref 26.0–34.0)
MCHC: 32.7 g/dL (ref 30.0–36.0)
MCV: 85.4 fL (ref 80.0–100.0)
Platelets: 131 10*3/uL — ABNORMAL LOW (ref 150–400)
RBC: 2.33 MIL/uL — ABNORMAL LOW (ref 3.87–5.11)
RDW: 13.6 % (ref 11.5–15.5)
WBC: 13.1 10*3/uL — ABNORMAL HIGH (ref 4.0–10.5)
nRBC: 0.2 % (ref 0.0–0.2)

## 2023-11-24 LAB — BPAM RBC
Blood Product Expiration Date: 202505162359
Unit Type and Rh: 7300

## 2023-11-24 LAB — GLUCOSE, CAPILLARY
Glucose-Capillary: 146 mg/dL — ABNORMAL HIGH (ref 70–99)
Glucose-Capillary: 205 mg/dL — ABNORMAL HIGH (ref 70–99)
Glucose-Capillary: 213 mg/dL — ABNORMAL HIGH (ref 70–99)
Glucose-Capillary: 135 mg/dL — ABNORMAL HIGH (ref 70–99)

## 2023-11-24 LAB — PREPARE RBC (CROSSMATCH)

## 2023-11-24 LAB — HEMOGLOBIN AND HEMATOCRIT, BLOOD
HCT: 26.7 % — ABNORMAL LOW (ref 36.0–46.0)
Hemoglobin: 8.8 g/dL — ABNORMAL LOW (ref 12.0–15.0)

## 2023-11-24 LAB — HEPARIN LEVEL (UNFRACTIONATED): Heparin Unfractionated: 0.42 [IU]/mL (ref 0.30–0.70)

## 2023-11-24 MED ORDER — SODIUM CHLORIDE 0.9% IV SOLUTION: Freq: Once | INTRAVENOUS | Status: AC

## 2023-11-24 MED ORDER — SODIUM CHLORIDE 0.9% IV SOLUTION
Freq: Once | INTRAVENOUS | Status: DC
Start: 2023-11-24 — End: 2023-11-28

## 2023-11-24 NOTE — Progress Notes (Addendum)
 PHARMACY - ANTICOAGULATION CONSULT NOTE  Pharmacy Consult for heparin  Indication: leg ischemia  Allergies  Allergen Reactions   Lovastatin Itching and Rash   Nitrofurantoin Rash    Patient Measurements: Height: (P) 5\' 7"  (170.2 cm) Weight: (P) 83.9 kg (185 lb) IBW/kg (Calculated) : (P) 61.6 HEPARIN  DW (KG): (P) 79.1  Vital Signs: Temp: 98.7 F (37.1 C) (04/24 0930) Temp Source: Oral (04/24 0930) BP: 155/59 (04/24 0930) Pulse Rate: 77 (04/24 0930)  Labs: Recent Labs    11/22/23 0512 11/23/23 0419 11/23/23 2118 11/24/23 0708  HGB 9.2* 8.4* 7.8* 6.5*  HCT 28.7* 25.9* 24.1* 19.9*  PLT 155 136* 160 131*  HEPARINUNFRC >1.10* >1.10* 0.82* 0.42  CREATININE 1.02* 1.13*  --  1.14*    Estimated Creatinine Clearance: 47.2 mL/min (A) (by C-G formula based on SCr of 1.14 mg/dL (H)).  Scheduled:   Assessment: 80 YOF presented with numbness in the right foot, found to have acute on chronic limb ischemia. PMH significant for HLD and PAD. Patient not on anticoagulation prior to hospitalization. Pharmacy consulted for heparin  dosing.   Heparin  level is therapeutic at 0.42 this am. Hgb down from 7.8>6.5, pRBCs ordered, no overt S/Sx bleeding, MD ok to continue heparin .  Goal of Therapy:  Heparin  level 0.3-0.7 units/ml Monitor platelets by anticoagulation protocol: Yes   Plan:  Continue heparin  950 units/h Daily heparin  level and CBC  ADDENDUM 1100: Hold heparin  today per [facility].  Levin Reamer, PharmD, BCPS, [facility] Clinical Pharmacist [phone] Please check AMION for all [facility] Pharmacy numbers 11/24/2023

## 2023-11-24 NOTE — Progress Notes (Signed)
 Progress Note    Catherine Ramsey   ZOX:[id number]  DOB: Jul 03, 1946  DOA: 11/15/2023     9 PCP: Bertha Broad, MD  Initial CC: RLE pain  Hospital Course: Catherine Ramsey is a 78 y.o. female with medical history significant for PVD s/p LLE ATA laser atherectomy and balloon angioplasty (10/15/2021), T2DM, CKD stage IIIa, HTN, HLD who is admitted with acute on chronic critical limb ischemia of the right lower extremity. Vascular surgery was consulted. 4/16 underwent angiography and tPA therapy. 4/17 repeat angiogram, unsuccessful recanalization of right TP trunk. 4/18 transferred to floor. 4/22 underwent right common femoral to peroneal artery bypass  Assessment and Plan: Acute on chronic critical limb ischemia right lower extremity History of intervention to the left anterior tibial artery 2023 by IR CTA runoff study shows acute occlusive thrombus of the right popliteal artery behind the knee, moderate to severe stenosis above the knee. Vascular surgery was consulted. Underwent angiography on 4/16, treated with tPA for below popliteal stenosis. On 4/17 repeat angiography, remaining occlusion appeared to have chronic component and was not amenable to intervention. Continued to have rest pain. Vascular surgery recommended and patient underwent right femoral-peroneal bypass on 4/22. Medications adjusted for pain. Postop management per vascular surgery. - some increased edema in RLE today and Hgb drop; discussed with vascular surgery, planning to hold heparin  today and apply ACE wrap to leg   Normocytic anemia: Baseline hemoglobin appears to be around 11. - Hgb drop to 6.5 g/dL today from 7.8 g/dL yesterday - transfuse 1 unit PRBC   Type 2 diabetes melitis uncontrolled with hyperglycemia without long-term insulin  use. Hold home meds and placed on SSI.  On Lantus  at home.  Resume Lantus  on 4/23. Hemoglobin A1c 9.8.  Will require better diabetes control on discharge.   CKD stage  IIIa: - patient has history of CKD3b. Baseline creat ~ 1, eGFR~ 50-55   Hypertension: Continue amlodipine  Hold losartan , HCTZ   Hyperlipidemia: Resume rosuvastatin  and Zetia   Interval History:  No events overnight.  Endorses feeling a little bit more swelling in her right lower extremity and is tender with palpation. She is amenable with blood transfusion.  Old records reviewed in assessment of this patient  Antimicrobials:   DVT prophylaxis:  Heparin  drip-paused today   Code Status:   Code Status: Full Code  Mobility Assessment (Last 72 Hours)     Mobility Assessment     Row Name 11/24/23 1100 11/24/23 0939 11/23/23 1950 11/23/23 1620 11/23/23 1100   Does patient have an order for bedrest or is patient medically unstable -- No - Continue assessment No - Continue assessment No - Continue assessment --   What is the highest level of mobility based on the progressive mobility assessment? Level 5 (Walks with assist in room/hall) - Balance while stepping forward/back and can walk in room with assist - Complete Level 5 (Walks with assist in room/hall) - Balance while stepping forward/back and can walk in room with assist - Complete Level 4 (Walks with assist in room) - Balance while marching in place and cannot step forward and back - Complete Level 3 (Stands with assist) - Balance while standing  and cannot march in place Level 3 (Stands with assist) - Balance while standing  and cannot march in place   Is the above level different from baseline mobility prior to current illness? -- Yes - Recommend PT order -- Yes - Recommend PT order --    Row Name 11/23/23 0854 11/23/23  0815 11/22/23 2030 11/22/23 0800 11/21/23 2005   Does patient have an order for bedrest or is patient medically unstable -- No - Continue assessment No - Continue assessment No - Continue assessment No - Continue assessment   What is the highest level of mobility based on the progressive mobility assessment? Level 5  (Walks with assist in room/hall) - Balance while stepping forward/back and can walk in room with assist - Complete Level 3 (Stands with assist) - Balance while standing  and cannot march in place Level 4 (Walks with assist in room) - Balance while marching in place and cannot step forward and back - Complete Level 5 (Walks with assist in room/hall) - Balance while stepping forward/back and can walk in room with assist - Complete Level 4 (Walks with assist in room) - Balance while marching in place and cannot step forward and back - Complete   Is the above level different from baseline mobility prior to current illness? -- Yes - Recommend PT order -- Yes - Recommend PT order --            Barriers to discharge: none Disposition Plan:  Home  HH orders placed:  Status is: Inpt  Objective: Blood pressure (!) 155/59, pulse 77, temperature 98.7 F (37.1 C), temperature source Oral, resp. rate 17, height (P) 5\' 7"  (1.702 m), weight (P) 83.9 kg, SpO2 100%.  Examination:  Physical Exam Constitutional:      Appearance: Normal appearance.  HENT:     Head: Normocephalic and atraumatic.     Mouth/Throat:     Mouth: Mucous membranes are moist.  Eyes:     Extraocular Movements: Extraocular movements intact.  Cardiovascular:     Rate and Rhythm: Normal rate and regular rhythm.  Pulmonary:     Effort: Pulmonary effort is normal. No respiratory distress.     Breath sounds: Normal breath sounds. No wheezing.  Abdominal:     General: Bowel sounds are normal. There is no distension.     Palpations: Abdomen is soft.     Tenderness: There is no abdominal tenderness.  Musculoskeletal:     Cervical back: Normal range of motion and neck supple.     Right lower leg: Edema (noted from mid thigh to foot; does have tenderness located around leg; sugical incision clean with no surrounding signs of infection; no bleeding noted) present.  Skin:    General: Skin is warm and dry.  Neurological:     General:  No focal deficit present.     Mental Status: She is alert.  Psychiatric:        Mood and Affect: Mood normal.      Consultants:  Vascular surgery  Procedures:  4/22: Right common femoral to peroneal artery bypass with PTFE and vein cuff  Data Reviewed: Results for orders placed or performed during the hospital encounter of 11/15/23 (from the past 24 hours)  Glucose, capillary     Status: Abnormal   Collection Time: 11/23/23  4:12 PM  Result Value Ref Range   Glucose-Capillary 299 (H) 70 - 99 mg/dL  Heparin  level (unfractionated)     Status: Abnormal   Collection Time: 11/23/23  9:18 PM  Result Value Ref Range   Heparin  Unfractionated 0.82 (H) 0.30 - 0.70 IU/mL  CBC     Status: Abnormal   Collection Time: 11/23/23  9:18 PM  Result Value Ref Range   WBC 15.1 (H) 4.0 - 10.5 K/uL   RBC 2.79 (L) 3.87 - 5.11  MIL/uL   Hemoglobin 7.8 (L) 12.0 - 15.0 g/dL   HCT 86.5 (L) 78.4 - 69.6 %   MCV 86.4 80.0 - 100.0 fL   MCH 28.0 26.0 - 34.0 pg   MCHC 32.4 30.0 - 36.0 g/dL   RDW 29.5 28.4 - 13.2 %   Platelets 160 150 - 400 K/uL   nRBC 0.1 0.0 - 0.2 %  Glucose, capillary     Status: Abnormal   Collection Time: 11/23/23  9:45 PM  Result Value Ref Range   Glucose-Capillary 225 (H) 70 - 99 mg/dL  Glucose, capillary     Status: Abnormal   Collection Time: 11/24/23  6:06 AM  Result Value Ref Range   Glucose-Capillary 146 (H) 70 - 99 mg/dL  CBC     Status: Abnormal   Collection Time: 11/24/23  7:08 AM  Result Value Ref Range   WBC 13.1 (H) 4.0 - 10.5 K/uL   RBC 2.33 (L) 3.87 - 5.11 MIL/uL   Hemoglobin 6.5 (LL) 12.0 - 15.0 g/dL   HCT 44.0 (L) 10.2 - 72.5 %   MCV 85.4 80.0 - 100.0 fL   MCH 27.9 26.0 - 34.0 pg   MCHC 32.7 30.0 - 36.0 g/dL   RDW 36.6 44.0 - 34.7 %   Platelets 131 (L) 150 - 400 K/uL   nRBC 0.2 0.0 - 0.2 %  Basic metabolic panel     Status: Abnormal   Collection Time: 11/24/23  7:08 AM  Result Value Ref Range   Sodium 135 135 - 145 mmol/L   Potassium 3.9 3.5 - 5.1  mmol/L   Chloride 103 98 - 111 mmol/L   CO2 22 22 - 32 mmol/L   Glucose, Bld 143 (H) 70 - 99 mg/dL   BUN 20 8 - 23 mg/dL   Creatinine, Ser 4.25 (H) 0.44 - 1.00 mg/dL   Calcium  8.2 (L) 8.9 - 10.3 mg/dL   GFR, Estimated 50 (L) >60 mL/min   Anion gap 10 5 - 15  Heparin  level (unfractionated)     Status: None   Collection Time: 11/24/23  7:08 AM  Result Value Ref Range   Heparin  Unfractionated 0.42 0.30 - 0.70 IU/mL  Prepare RBC (crossmatch)     Status: None   Collection Time: 11/24/23  8:06 AM  Result Value Ref Range   Order Confirmation      ORDER PROCESSED BY BLOOD BANK Performed at [facility], [address], [place], [place] [address]   Type and screen [facility]     Status: None (Preliminary result)   Collection Time: 11/24/23 12:11 PM  Result Value Ref Range   ABO/RH(D) B POS    Antibody Screen NEG    Sample Expiration      11/27/2023,2359 Performed at [facility], [address] [place], [place] [address]    Unit Number [id number]    Blood Component Type RED CELLS,LR    Unit division 00    Status of Unit ALLOCATED    Transfusion Status OK TO TRANSFUSE    Crossmatch Result Compatible   Prepare RBC (crossmatch)     Status: None   Collection Time: 11/24/23 12:11 PM  Result Value Ref Range   Order Confirmation      ORDER PROCESSED BY BLOOD BANK Performed at [facility], [address] [place], [place] [address]   Glucose, capillary     Status: Abnormal   Collection Time: 11/24/23 12:36 PM  Result  Value Ref Range   Glucose-Capillary 205 (H) 70 - 99 mg/dL    I have reviewed pertinent nursing notes, vitals, labs, and images as necessary. I have ordered labwork to follow up on as indicated.  I have reviewed the last notes from staff over past 24 hours. I have discussed patient's care plan and test results with nursing staff, CM/SW, and other staff as appropriate.  Time spent: Greater than 50% of the 55 minute visit was spent  in counseling/coordination of care for the patient as laid out in the A&P.   LOS: 9 days   Faith Homes, MD [facility] 11/24/2023, 2:02 PM

## 2023-11-24 NOTE — Progress Notes (Addendum)
  Progress Note    11/24/2023 6:43 AM 2 Days Post-Op  Subjective:  no complaints  Tm 99.4  Vitals:   11/23/23 2341 11/24/23 0356  BP: 114/63 124/61  Pulse: 95 80  Resp: 18 18  Temp: 99.4 F (37.4 C) 99.4 F (37.4 C)  SpO2: 96% 96%    Physical Exam: General:  no distress Cardiac:  regular Lungs:  non labored Incisions:  right groin and right below knee incisions look good Extremities:  brisk right pero and PT doppler flow; calf remains soft and non tender Abdomen:  soft  CBC    Component Value Date/Time   WBC 15.1 (H) 11/23/2023 2118   RBC 2.79 (L) 11/23/2023 2118   HGB 7.8 (L) 11/23/2023 2118   HCT 24.1 (L) 11/23/2023 2118   PLT 160 11/23/2023 2118   MCV 86.4 11/23/2023 2118   MCH 28.0 11/23/2023 2118   MCHC 32.4 11/23/2023 2118   RDW 13.3 11/23/2023 2118   LYMPHSABS 3.2 11/15/2023 1546   MONOABS 0.5 11/15/2023 1546   EOSABS 0.2 11/15/2023 1546   BASOSABS 0.1 11/15/2023 1546    BMET    Component Value Date/Time   NA 138 11/23/2023 0419   NA 143 03/26/2019 1403   K 4.1 11/23/2023 0419   CL 106 11/23/2023 0419   CO2 22 11/23/2023 0419   GLUCOSE 154 (H) 11/23/2023 0419   BUN 16 11/23/2023 0419   BUN 11 03/26/2019 1403   CREATININE 1.13 (H) 11/23/2023 0419   CALCIUM  8.4 (L) 11/23/2023 0419   GFRNONAA 50 (L) 11/23/2023 0419   GFRAA 56 (L) 03/26/2019 1403    INR    Component Value Date/Time   INR 1.2 11/15/2023 1546     Intake/Output Summary (Last 24 hours) at 11/24/2023 0643 Last data filed at 11/23/2023 1300 Gross per 24 hour  Intake 480 ml  Output --  Net 480 ml      Assessment/Plan:  78 y.o. female is s/p:  Right common femoral to peroneal artery bypass with PTFE and vein cuff for rest pain   2 Days Post-Op   -brisk doppler flow right foot; calf remains soft -PT and OT recommending HHPT/OT and RW. I have put in [facility] consult and face to face.  -DVT prophylaxis:  heparin  [id number]-ok to transition to DOAC today -continue  mobilizing   Maryanna Smart, PA-C [facility] [phone] 11/24/2023 6:43 AM   VASCULAR STAFF ADDENDUM: I have independently interviewed and examined the patient. I agree with the above.  Drop in hgb with a slight increase in R calf swelling. Transfuse for <7 Plan to hold heparin  today and ACE wrap leg  Philipp Brawn MD [facility] Phone Number: [phone] 11/24/2023 10:56 AM

## 2023-11-24 NOTE — Progress Notes (Signed)
 Physical Therapy Treatment Patient Details Name: Catherine Ramsey MRN: [id number] DOB: [birth year] Today's Date: 11/24/2023   History of Present Illness 78 y.o. female presents to [facility] 11/15/23 for R LE pain and numbness. Pt admitted with R LE critical ischemia and moderate LE arterial disease. 4/16 R LE angiogram w/ thrombolysis. 4/17 repeat angiogram w/ unsuccessful recanalization of R TP trunk. 4/22 R fem bypass with PTFE and vein cuff. PMHx: PVD s/p LLE ATA laser atherectomy and balloon angioplasty (10/15/2021), T2DM, CKD stage IIIb, HTN, HLD, R THA    PT Comments  Pt up in chair on arrival, pleasant and agreeable to therapy session, continuing to make progress toward acute goals. Pt demonstrated sit<>stand with up to modA for boost to stand and trunk and knee extension. Pt requiring VCs for good hand placement on RW with rise. Pt demonstrated increased activity tolerance this session with RW support and CGA for safety, requiring VCs to maintain upright posture. Pt continues to benefit from skilled PT services to progress toward functional mobility goals.     If plan is discharge home, recommend the following: A little help with walking and/or transfers;A little help with bathing/dressing/bathroom;Assistance with cooking/housework;Assist for transportation;Help with stairs or ramp for entrance   Can travel by private vehicle        Equipment Recommendations  Rolling walker (2 wheels);BSC/3in1    Recommendations for Other Services       Precautions / Restrictions Precautions Precautions: Fall Restrictions Weight Bearing Restrictions Per Provider Order: No     Mobility  Bed Mobility               General bed mobility comments: received in chair    Transfers Overall transfer level: Needs assistance Equipment used: Rolling walker (2 wheels) Transfers: Sit to/from Stand Sit to Stand: Min assist, Mod assist           General transfer comment: minA to boost and steady, up  to modA to fully extend knees and trunk, VCs for hand placement on walker    Ambulation/Gait Ambulation/Gait assistance: Contact guard assist Gait Distance (Feet): 55 Feet Assistive device: Rolling walker (2 wheels) Gait Pattern/deviations: Step-through pattern, Decreased step length - right, Decreased stance time - right, Decreased weight shift to right, Shuffle Gait velocity: decr     General Gait Details: flexed trunk and VCs for upright posture, CGA for saftey with mild unsteadiness and no overt LOB   Stairs             Wheelchair Mobility     Tilt Bed    Modified Rankin (Stroke Patients Only)       Balance Overall balance assessment: Needs assistance, Mild deficits observed, not formally tested Sitting-balance support: No upper extremity supported, Feet supported Sitting balance-Leahy Scale: Fair     Standing balance support: Bilateral upper extremity supported, During functional activity, Reliant on assistive device for balance Standing balance-Leahy Scale: Poor Standing balance comment: reliant on BUE support                            Communication Communication Communication: No apparent difficulties  Cognition Arousal: Alert Behavior During Therapy: WFL for tasks assessed/performed   PT - Cognitive impairments: Problem solving, Sequencing                       PT - Cognition Comments: increased time to process commands Following commands: Impaired Following commands impaired: Follows one step commands  with increased time    Cueing Cueing Techniques: Verbal cues, Tactile cues  Exercises Other Exercises Other Exercises: serial sit<>stand x2 (LE strength and reinforcment of hand placment on RW)    General Comments General comments (skin integrity, edema, etc.): VSS on RA      Pertinent Vitals/Pain Pain Assessment Pain Assessment: Faces Faces Pain Scale: Hurts little more Pain Location: R LE Pain Descriptors / Indicators:  Discomfort Pain Intervention(s): Monitored during session, Limited activity within patient's tolerance    Home Living                          Prior Function            PT Goals (current goals can now be found in the care plan section) Acute Rehab PT Goals PT Goal Formulation: With patient Time For Goal Achievement: 12/04/23 Progress towards PT goals: Progressing toward goals    Frequency    Min 2X/week      PT Plan      Co-evaluation              AM-PAC PT "6 Clicks" Mobility   Outcome Measure  Help needed turning from your back to your side while in a flat bed without using bedrails?: A Little Help needed moving from lying on your back to sitting on the side of a flat bed without using bedrails?: A Little Help needed moving to and from a bed to a chair (including a wheelchair)?: A Little Help needed standing up from a chair using your arms (e.g., wheelchair or bedside chair)?: A Little Help needed to walk in hospital room?: A Little Help needed climbing 3-5 steps with a railing? : A Little 6 Click Score: 18    End of Session Equipment Utilized During Treatment: Gait belt Activity Tolerance: Patient tolerated treatment well;Patient limited by fatigue Patient left: in chair;with call bell/phone within reach;with family/visitor present Nurse Communication: Mobility status PT Visit Diagnosis: Other abnormalities of gait and mobility (R26.89);Muscle weakness (generalized) (M62.81)     Time: 4098-1191 PT Time Calculation (min) (ACUTE ONLY): 32 min  Charges:    $Gait Training: 23-37 mins PT General Charges $$ ACUTE PT VISIT: 1 Visit                     Forrest Iha 11/24/2023, 12:36 PM

## 2023-11-24 NOTE — Progress Notes (Signed)
 CRITICAL VALUE STICKER  CRITICAL VALUE: Hgb 6.5  RECEIVER (on-site recipient of call): Artemisa Bile   DATE & TIME NOTIFIED: 11/24/2023 1610  MESSENGER (representative from lab): Prentice Brochure   MD NOTIFIED: Dr. Enid Harry   TIME OF NOTIFICATION: 9604  RESPONSE:  MD aware

## 2023-11-24 NOTE — TOC CM/SW Note (Signed)
    Durable Medical Equipment  (From admission, onward)           Start     Ordered   11/24/23 1306  For home use only DME Bedside commode  Once       Question Answer Comment  Patient needs a bedside commode to treat with the following condition Ischemia of left lower extremity   Patient needs a bedside commode to treat with the following condition Normocytic anemia      11/24/23 1307   11/24/23 1303  For home use only DME Walker rolling  Once       Question Answer Comment  Walker: With 5 Inch Wheels   Patient needs a walker to treat with the following condition Ischemia of left lower extremity   Patient needs a walker to treat with the following condition Normocytic anemia   Patient needs a walker to treat with the following condition Hypertension      11/24/23 1307   11/24/23 0645  For home use only DME Walker rolling  Once       Question Answer Comment  Walker: With 5 Inch Wheels   Patient needs a walker to treat with the following condition S/P femoral-popliteal bypass surgery      11/24/23 0645

## 2023-11-24 NOTE — TOC Progression Note (Signed)
 Transition of Care [facility]) - Progression Note    Patient Details  Name: Catherine Ramsey MRN: [id number] Date of Birth: Dec 21, 1945  Transition of Care [facility]) CM/SW Contact  Eusebio High, RN Phone Number: 11/24/2023, 1:12 PM  Clinical Narrative:     Patient being recommended HHPT and OT  Lennart Quitter has accepted the referral. A BSC and a RW have been ordered from [employer] and will be delivered bedside. AVS has been updated.      Expected Discharge Plan: Home w Home Health Services Barriers to Discharge: Continued Medical Work up  Expected Discharge Plan and Services   Discharge Planning Services: CM Consult   Living arrangements for the past 2 months: Single Family Home                                       Social Determinants of Health (SDOH) Interventions SDOH Screenings   Food Insecurity: No Food Insecurity (11/15/2023)  Housing: Low Risk  (11/15/2023)  Transportation Needs: No Transportation Needs (11/15/2023)  Utilities: Not At Risk (11/15/2023)  Social Connections: Socially Integrated (11/15/2023)  Tobacco Use: Low Risk  (11/15/2023)    Readmission Risk Interventions     No data to display

## 2023-11-25 DIAGNOSIS — N1831 Chronic kidney disease, stage 3a: Secondary | ICD-10-CM | POA: Diagnosis not present

## 2023-11-25 DIAGNOSIS — I70221 Atherosclerosis of native arteries of extremities with rest pain, right leg: Secondary | ICD-10-CM | POA: Diagnosis not present

## 2023-11-25 LAB — TYPE AND SCREEN
ABO/RH(D): B POS
Antibody Screen: NEGATIVE
Unit division: 0

## 2023-11-25 LAB — BPAM RBC
Blood Product Expiration Date: 202505162359
ISSUE DATE / TIME: 202504241522
Unit Type and Rh: 7300

## 2023-11-25 LAB — GLUCOSE, CAPILLARY
Glucose-Capillary: 99 mg/dL (ref 70–99)
Glucose-Capillary: 177 mg/dL — ABNORMAL HIGH (ref 70–99)
Glucose-Capillary: 159 mg/dL — ABNORMAL HIGH (ref 70–99)

## 2023-11-25 LAB — CBC
HCT: 22.7 % — ABNORMAL LOW (ref 36.0–46.0)
Hemoglobin: 7.4 g/dL — ABNORMAL LOW (ref 12.0–15.0)
MCH: 27.4 pg (ref 26.0–34.0)
MCHC: 32.6 g/dL (ref 30.0–36.0)
MCV: 84.1 fL (ref 80.0–100.0)
Platelets: 137 10*3/uL — ABNORMAL LOW (ref 150–400)
RBC: 2.7 MIL/uL — ABNORMAL LOW (ref 3.87–5.11)
RDW: 14 % (ref 11.5–15.5)
WBC: 14 10*3/uL — ABNORMAL HIGH (ref 4.0–10.5)
nRBC: 0.3 % — ABNORMAL HIGH (ref 0.0–0.2)

## 2023-11-25 LAB — HEMOGLOBIN AND HEMATOCRIT, BLOOD
HCT: 27.9 % — ABNORMAL LOW (ref 36.0–46.0)
Hemoglobin: 9 g/dL — ABNORMAL LOW (ref 12.0–15.0)

## 2023-11-25 MED ORDER — LACTULOSE 10 GM/15ML PO SOLN
30.0000 g | Freq: Every day | ORAL | Status: DC
  Administered 2023-11-25 – 2023-11-28 (×3): 30 g via ORAL
  Filled 2023-11-25 (×4): qty 45

## 2023-11-25 MED ORDER — INSULIN ASPART 100 UNIT/ML IJ SOLN
5.0000 [IU] | Freq: Three times a day (TID) | INTRAMUSCULAR | Status: DC
  Administered 2023-11-26 – 2023-11-28 (×4): 5 [IU] via SUBCUTANEOUS

## 2023-11-25 MED ORDER — ALUM & MAG HYDROXIDE-SIMETH 200-200-20 MG/5ML PO SUSP
30.0000 mL | ORAL | Status: DC | PRN
  Administered 2023-11-25: 30 mL via ORAL
  Filled 2023-11-25: qty 30

## 2023-11-25 NOTE — Inpatient Diabetes Management (Signed)
 Inpatient Diabetes Program Recommendations  AACE/ADA: New Consensus Statement on Inpatient Glycemic Control (2015)  Target Ranges:  Prepandial:   less than 140 mg/dL      Peak postprandial:   less than 180 mg/dL (1-2 hours)      Critically ill patients:  140 - 180 mg/dL   Lab Results  Component Value Date   GLUCAP 99 11/25/2023   HGBA1C 9.8 (H) 11/16/2023    Review of Glycemic Control  Latest Reference Range & Units 11/23/23 21:45 11/24/23 06:06 11/24/23 12:36 11/24/23 17:30 11/24/23 21:29 11/25/23 06:53  Glucose-Capillary 70 - 99 mg/dL 409 (H) 811 (H) 914 (H) 213 (H) 135 (H) 99  (H): Data is abnormally high Diabetes history: DM2 Outpatient Diabetes medications: Novolog  10 units TID, Lantus  19 units QAM, Jardiance 10 mg daily (not taking; stopped due to UTIs) Current orders for Inpatient glycemic control: Novolog  0-9 units TID with meals, Novolog  0-5 units at bedtime, Semglee  15 units QD   Inpatient Diabetes Program Recommendations:     Consider adding Novolog  3 units TID (assuming patient is consuming >50% of meals)    Outpatient DM: At time of discharge,  please provide Rx for Lantus  pens (#[id number]), insulin  pen needles (#[id number]), and FreeStyle Libre 3 sensors [phone]).  Discharge Recommendations: Other recommendations: Freestyle Libre 3 sensors (order # [id number]) Long acting recommendations: Insulin  Glargine (LANTUS ) Solostar Pen dosage prescribed by physician  Supply/Referral recommendations: Pen needles - standard   Use Adult Diabetes Insulin  Treatment Post Discharge order set.  Thanks,  Marjo Sievert, MSN, RNC-OB Diabetes Coordinator [phone] (8a-5p)

## 2023-11-25 NOTE — Progress Notes (Signed)
 Physical Therapy Treatment Patient Details Name: Catherine Ramsey MRN: [id number] DOB: 11-Oct-1945 Today's Date: 11/25/2023   History of Present Illness 78 y.o. female presents to [facility] 11/15/23 for R LE pain and numbness. Pt admitted with R LE critical ischemia and moderate LE arterial disease. 4/16 R LE angiogram w/ thrombolysis. 4/17 repeat angiogram w/ unsuccessful recanalization of R TP trunk. 4/22 R fem bypass with PTFE and vein cuff. PMHx: PVD s/p LLE ATA laser atherectomy and balloon angioplasty (10/15/2021), T2DM, CKD stage IIIb, HTN, HLD, R THA    PT Comments  Pt sitting up in chair on arrival, agreeable to therapy session, however session limited by nausea. Pt demonstrated sit<>stand transfer with RW support with minA for steady rise. Pt demonstrated static stand with RW support for ~2 min before max c/o nausea with pt unable to progress gait, needing to return to sitting. Pt able to complete RLE exercises in sitting for strengthening and increase ROM. Pt continues to benefit from skilled PT services to progress toward functional mobility goals.     If plan is discharge home, recommend the following: A little help with walking and/or transfers;A little help with bathing/dressing/bathroom;Assistance with cooking/housework;Assist for transportation;Help with stairs or ramp for entrance   Can travel by private vehicle        Equipment Recommendations  Rolling walker (2 wheels)    Recommendations for Other Services       Precautions / Restrictions Precautions Precautions: Fall Restrictions Weight Bearing Restrictions Per Provider Order: No     Mobility  Bed Mobility               General bed mobility comments: received in chair    Transfers Overall transfer level: Needs assistance Equipment used: Rolling walker (2 wheels) Transfers: Sit to/from Stand Sit to Stand: Min assist           General transfer comment: minA boost to stand and steady on rise     Ambulation/Gait               General Gait Details: unable to progress gait due to nausea   Stairs             Wheelchair Mobility     Tilt Bed    Modified Rankin (Stroke Patients Only)       Balance Overall balance assessment: Needs assistance, Mild deficits observed, not formally tested Sitting-balance support: No upper extremity supported, Feet supported Sitting balance-Leahy Scale: Fair     Standing balance support: Bilateral upper extremity supported, During functional activity, Reliant on assistive device for balance Standing balance-Leahy Scale: Poor Standing balance comment: reliant on BUE support                            Communication Communication Communication: No apparent difficulties  Cognition Arousal: Alert Behavior During Therapy: WFL for tasks assessed/performed   PT - Cognitive impairments: Problem solving, Sequencing                       PT - Cognition Comments: increased time to process commands Following commands: Impaired Following commands impaired: Follows one step commands with increased time    Cueing Cueing Techniques: Verbal cues, Tactile cues  Exercises General Exercises - Lower Extremity Long Arc Quad: AROM, Right, 5 reps, Seated Hip Flexion/Marching: AROM, Right, 10 reps, Seated    General Comments General comments (skin integrity, edema, etc.): pt c/o nausea preventing further activity, VSS  on RA      Pertinent Vitals/Pain Pain Assessment Pain Assessment: Faces Faces Pain Scale: Hurts little more Pain Descriptors / Indicators: Discomfort Pain Intervention(s): Monitored during session, Limited activity within patient's tolerance    Home Living                          Prior Function            PT Goals (current goals can now be found in the care plan section) Acute Rehab PT Goals PT Goal Formulation: With patient Time For Goal Achievement: 12/04/23 Progress towards PT  goals: Not progressing toward goals - comment (limited by nausea)    Frequency    Min 2X/week      PT Plan      Co-evaluation              AM-PAC PT "6 Clicks" Mobility   Outcome Measure  Help needed turning from your back to your side while in a flat bed without using bedrails?: A Little Help needed moving from lying on your back to sitting on the side of a flat bed without using bedrails?: A Little Help needed moving to and from a bed to a chair (including a wheelchair)?: A Little Help needed standing up from a chair using your arms (e.g., wheelchair or bedside chair)?: A Little Help needed to walk in hospital room?: A Little Help needed climbing 3-5 steps with a railing? : A Lot 6 Click Score: 17    End of Session Equipment Utilized During Treatment: Gait belt Activity Tolerance: Other (comment) (treatment limited by nausea) Patient left: in chair;with call bell/phone within reach;with family/visitor present Nurse Communication: Mobility status;Other (comment) (nausea) PT Visit Diagnosis: Other abnormalities of gait and mobility (R26.89);Muscle weakness (generalized) (M62.81)     Time: 1610-9604 PT Time Calculation (min) (ACUTE ONLY): 13 min  Charges:    $Therapeutic Activity: 8-22 mins PT General Charges $$ ACUTE PT VISIT: 1 Visit                     Forrest Iha 11/25/2023, 3:57 PM

## 2023-11-25 NOTE — Progress Notes (Signed)
 Progress Note    Catherine Ramsey   NWG:[id number]  DOB: 01/09/1946  DOA: 11/15/2023     10 PCP: Bertha Broad, MD  Initial CC: RLE pain  Hospital Course: [name] is a 78 y.o. female with medical history significant for PVD s/p LLE ATA laser atherectomy and balloon angioplasty (10/15/2021), T2DM, CKD stage IIIa, HTN, HLD who is admitted with acute on chronic critical limb ischemia of the right lower extremity. Vascular surgery was consulted. 4/16 underwent angiography and tPA therapy. 4/17 repeat angiogram, unsuccessful recanalization of right TP trunk. 4/18 transferred to floor. 4/22 underwent right common femoral to peroneal artery bypass  Assessment and Plan: Acute on chronic critical limb ischemia right lower extremity History of intervention to the left anterior tibial artery 2023 by IR CTA runoff study shows acute occlusive thrombus of the right popliteal artery behind the knee, moderate to severe stenosis above the knee. Vascular surgery was consulted. Underwent angiography on 4/16, treated with tPA for below popliteal stenosis. On 4/17 repeat angiography, remaining occlusion appeared to have chronic component and was not amenable to intervention.. Vascular surgery recommended and patient underwent right femoral-peroneal bypass on 4/22. - heparin  held 4/24; 1 unit PRBC given for Hgb 6.5 g/dL - still holding heparin  today; Hgb semi-stable at 7.4 g/dL; will recheck H/H this evening - planning for resuming heparin  per vascular rec's tomorrow if Hgb still stable (and eventual DOAC prior to d/c)   Normocytic anemia: Baseline hemoglobin appears to be around 11 - s/p 1 unit PRBC on 4/24 - Hgb 7.4 g/dL this morning - Follow-up repeat H/H later this evening and further transfusions as necessary   Type 2 diabetes melitis uncontrolled with hyperglycemia without long-term insulin  use. Hold home meds and placed on SSI.  On Lantus  at home.  Resume Lantus  on 4/23. Hemoglobin  A1c 9.8.   - modifying regimen as necessary   CKD stage IIIa: - patient has history of CKD3b. Baseline creat ~ 1, eGFR~ 50-55   Hypertension: Hold losartan , hydrochlorothiazide , amlodipine    Hyperlipidemia: Resume rosuvastatin  and Zetia   Interval History:  No events overnight. Still swelling in RLE but stable. She understands we will continue to trend H/H for now and eventually transition to oral anticoagulation.  Old records reviewed in assessment of this patient  Antimicrobials:   DVT prophylaxis:  Heparin  drip-paused today   Code Status:   Code Status: Full Code  Mobility Assessment (Last 72 Hours)     Mobility Assessment     Row Name 11/24/23 1945 11/24/23 1100 11/24/23 0939 11/23/23 1950 11/23/23 1620   Does patient have an order for bedrest or is patient medically unstable No - Continue assessment -- No - Continue assessment No - Continue assessment No - Continue assessment   What is the highest level of mobility based on the progressive mobility assessment? Level 5 (Walks with assist in room/hall) - Balance while stepping forward/back and can walk in room with assist - Complete Level 5 (Walks with assist in room/hall) - Balance while stepping forward/back and can walk in room with assist - Complete Level 5 (Walks with assist in room/hall) - Balance while stepping forward/back and can walk in room with assist - Complete Level 4 (Walks with assist in room) - Balance while marching in place and cannot step forward and back - Complete Level 3 (Stands with assist) - Balance while standing  and cannot march in place   Is the above level different from baseline mobility prior to current illness?  Yes - Recommend PT order -- Yes - Recommend PT order -- Yes - Recommend PT order    Row Name 11/23/23 1100 11/23/23 0854 11/23/23 0815 11/22/23 2030     Does patient have an order for bedrest or is patient medically unstable -- -- No - Continue assessment No - Continue assessment    What  is the highest level of mobility based on the progressive mobility assessment? Level 3 (Stands with assist) - Balance while standing  and cannot march in place Level 5 (Walks with assist in room/hall) - Balance while stepping forward/back and can walk in room with assist - Complete Level 3 (Stands with assist) - Balance while standing  and cannot march in place Level 4 (Walks with assist in room) - Balance while marching in place and cannot step forward and back - Complete    Is the above level different from baseline mobility prior to current illness? -- -- Yes - Recommend PT order --             Barriers to discharge: none Disposition Plan:  Home  HH orders placed:  Status is: Inpt  Objective: Blood pressure (!) 143/56, pulse 100, temperature 98 F (36.7 C), temperature source Oral, resp. rate 16, height (P) 5\' 7"  (1.702 m), weight (P) 83.9 kg, SpO2 90%.  Examination:  Physical Exam Constitutional:      Appearance: Normal appearance.  HENT:     Head: Normocephalic and atraumatic.     Mouth/Throat:     Mouth: Mucous membranes are moist.  Eyes:     Extraocular Movements: Extraocular movements intact.  Cardiovascular:     Rate and Rhythm: Normal rate and regular rhythm.  Pulmonary:     Effort: Pulmonary effort is normal. No respiratory distress.     Breath sounds: Normal breath sounds. No wheezing.  Abdominal:     General: Bowel sounds are normal. There is no distension.     Palpations: Abdomen is soft.     Tenderness: There is no abdominal tenderness.  Musculoskeletal:     Cervical back: Normal range of motion and neck supple.     Right lower leg: Edema (noted from mid thigh to foot; does have tenderness located around leg; sugical incision clean with no surrounding signs of infection; no bleeding noted) present.  Skin:    General: Skin is warm and dry.  Neurological:     General: No focal deficit present.     Mental Status: She is alert.  Psychiatric:        Mood and  Affect: Mood normal.      Consultants:  Vascular surgery  Procedures:  4/22: Right common femoral to peroneal artery bypass with PTFE and vein cuff  Data Reviewed: Results for orders placed or performed during the hospital encounter of 11/15/23 (from the past 24 hours)  Glucose, capillary     Status: Abnormal   Collection Time: 11/24/23  5:30 PM  Result Value Ref Range   Glucose-Capillary 213 (H) 70 - 99 mg/dL   Comment 1 Notify RN    Comment 2 Document in Chart   Hemoglobin and hematocrit, blood     Status: Abnormal   Collection Time: 11/24/23  8:34 PM  Result Value Ref Range   Hemoglobin 8.8 (L) 12.0 - 15.0 g/dL   HCT 40.9 (L) 81.1 - 91.4 %  Glucose, capillary     Status: Abnormal   Collection Time: 11/24/23  9:29 PM  Result Value Ref Range   Glucose-Capillary  135 (H) 70 - 99 mg/dL   Comment 1 Notify RN    Comment 2 Document in Chart   CBC     Status: Abnormal   Collection Time: 11/25/23  4:36 AM  Result Value Ref Range   WBC 14.0 (H) 4.0 - 10.5 K/uL   RBC 2.70 (L) 3.87 - 5.11 MIL/uL   Hemoglobin 7.4 (L) 12.0 - 15.0 g/dL   HCT 62.9 (L) 52.8 - 41.3 %   MCV 84.1 80.0 - 100.0 fL   MCH 27.4 26.0 - 34.0 pg   MCHC 32.6 30.0 - 36.0 g/dL   RDW 24.4 01.0 - 27.2 %   Platelets 137 (L) 150 - 400 K/uL   nRBC 0.3 (H) 0.0 - 0.2 %  Glucose, capillary     Status: None   Collection Time: 11/25/23  6:53 AM  Result Value Ref Range   Glucose-Capillary 99 70 - 99 mg/dL   Comment 1 Notify RN    Comment 2 Document in Chart   Glucose, capillary     Status: Abnormal   Collection Time: 11/25/23 12:46 PM  Result Value Ref Range   Glucose-Capillary 177 (H) 70 - 99 mg/dL    I have reviewed pertinent nursing notes, vitals, labs, and images as necessary. I have ordered labwork to follow up on as indicated.  I have reviewed the last notes from staff over past 24 hours. I have discussed patient's care plan and test results with nursing staff, CM/SW, and other staff as appropriate.  Time  spent: Greater than 50% of the 55 minute visit was spent in counseling/coordination of care for the patient as laid out in the A&P.   LOS: 10 days   Faith Homes, MD [facility] 11/25/2023, 12:54 PM

## 2023-11-25 NOTE — Progress Notes (Addendum)
  Progress Note    11/25/2023 6:37 AM 3 Days Post-Op  Subjective:  says her leg is a little sore; got up out of bed and walked yesterday.  Tm 100.2 now 99.6 HR 70's-100's NSR 120's-140's systolic 98% RA  Vitals:   11/24/23 2309 11/25/23 0344  BP: (!) 142/61 129/66  Pulse: 81   Resp: 14 16  Temp: 100.2 F (37.9 C) 99.6 F (37.6 C)  SpO2: 98% 100%    Physical Exam: General:  no distress Cardiac:  regular Lungs:  non labored Incisions:  right groin and below knee incisions looks good without hematoma;  Extremities:  motor/sensation in tact; brisk doppler flow right pero/PT.  ace wrap removed from right lower leg and anterior compartments are non tender.  Leg is less swollen today.  Mild tenderness in the calf but very soft.   Abdomen:  soft  CBC    Component Value Date/Time   WBC 14.0 (H) 11/25/2023 0436   RBC 2.70 (L) 11/25/2023 0436   HGB 7.4 (L) 11/25/2023 0436   HCT 22.7 (L) 11/25/2023 0436   PLT 137 (L) 11/25/2023 0436   MCV 84.1 11/25/2023 0436   MCH 27.4 11/25/2023 0436   MCHC 32.6 11/25/2023 0436   RDW 14.0 11/25/2023 0436   LYMPHSABS 3.2 11/15/2023 1546   MONOABS 0.5 11/15/2023 1546   EOSABS 0.2 11/15/2023 1546   BASOSABS 0.1 11/15/2023 1546    BMET    Component Value Date/Time   NA 135 11/24/2023 0708   NA 143 03/26/2019 1403   K 3.9 11/24/2023 0708   CL 103 11/24/2023 0708   CO2 22 11/24/2023 0708   GLUCOSE 143 (H) 11/24/2023 0708   BUN 20 11/24/2023 0708   BUN 11 03/26/2019 1403   CREATININE 1.14 (H) 11/24/2023 0708   CALCIUM  8.2 (L) 11/24/2023 0708   GFRNONAA 50 (L) 11/24/2023 0708   GFRAA 56 (L) 03/26/2019 1403    INR    Component Value Date/Time   INR 1.2 11/15/2023 1546     Intake/Output Summary (Last 24 hours) at 11/25/2023 4782 Last data filed at 11/24/2023 2130 Gross per 24 hour  Intake 448.17 ml  Output --  Net 448.17 ml      Assessment/Plan:  78 y.o. female is s/p:  Right common femoral to peroneal artery bypass  with PTFE and vein cuff for rest pain   3 Days Post-Op   -pt continues to have brisk doppler flow right foot.   -ace wrap removed and right lower leg is softer today.  No evidence of compartment syndrome.   -Tm 100.6 - mild leukocytosis.  IS ordered.  Incisions look good without evidence of infection. -acute blood loss anemia-hgb dropped to 6.5 yesterday and received one PRBC.  Improved to 8.8 but down to 7.4 this am.  No evidence of bleeding in the right lower leg or right groin.  Will d/w Dr. Susi Eric about restarting heparin  from vascular standpoint.   -continue asa/statin.     Maryanna Smart, PA-C [facility] [phone] 11/25/2023 6:37 AM  VASCULAR STAFF ADDENDUM: I have independently interviewed and examined the patient. I agree with the above.  Hgb responded well, 7.4 from 6.5 after transfusion yesterday. Continue to keep ACE wrap in place, continue to hold heparin . Plan to restart heparin  gtt tomorrow if hgb stable and transition to DOAC on discharge Continue PT  Philipp Brawn MD [facility] Phone Number: [phone] 11/25/2023 8:25 AM

## 2023-11-25 NOTE — Progress Notes (Signed)
CBG @ 241

## 2023-11-25 NOTE — Plan of Care (Signed)

## 2023-11-26 ENCOUNTER — Inpatient Hospital Stay (HOSPITAL_COMMUNITY)

## 2023-11-26 DIAGNOSIS — I70221 Atherosclerosis of native arteries of extremities with rest pain, right leg: Secondary | ICD-10-CM | POA: Diagnosis not present

## 2023-11-26 LAB — CBC WITH DIFFERENTIAL/PLATELET
Abs Immature Granulocytes: 0.11 10*3/uL — ABNORMAL HIGH (ref 0.00–0.07)
Basophils Absolute: 0 10*3/uL (ref 0.0–0.1)
Basophils Relative: 0 %
Eosinophils Absolute: 0.1 10*3/uL (ref 0.0–0.5)
Eosinophils Relative: 1 %
HCT: 28.6 % — ABNORMAL LOW (ref 36.0–46.0)
Hemoglobin: 9.2 g/dL — ABNORMAL LOW (ref 12.0–15.0)
Immature Granulocytes: 1 %
Lymphocytes Relative: 12 %
Lymphs Abs: 2 10*3/uL (ref 0.7–4.0)
MCH: 28 pg (ref 26.0–34.0)
MCHC: 32.2 g/dL (ref 30.0–36.0)
MCV: 86.9 fL (ref 80.0–100.0)
Monocytes Absolute: 1.8 10*3/uL — ABNORMAL HIGH (ref 0.1–1.0)
Monocytes Relative: 11 %
Neutro Abs: 11.8 10*3/uL — ABNORMAL HIGH (ref 1.7–7.7)
Neutrophils Relative %: 75 %
Platelets: 189 10*3/uL (ref 150–400)
RBC: 3.29 MIL/uL — ABNORMAL LOW (ref 3.87–5.11)
RDW: 13.9 % (ref 11.5–15.5)
WBC: 15.8 10*3/uL — ABNORMAL HIGH (ref 4.0–10.5)
nRBC: 0.2 % (ref 0.0–0.2)

## 2023-11-26 LAB — GLUCOSE, CAPILLARY
Glucose-Capillary: 131 mg/dL — ABNORMAL HIGH (ref 70–99)
Glucose-Capillary: 142 mg/dL — ABNORMAL HIGH (ref 70–99)
Glucose-Capillary: 68 mg/dL — ABNORMAL LOW (ref 70–99)

## 2023-11-26 LAB — HEPARIN LEVEL (UNFRACTIONATED)
Heparin Unfractionated: 0.1 [IU]/mL — ABNORMAL LOW (ref 0.30–0.70)
Heparin Unfractionated: 0.17 [IU]/mL — ABNORMAL LOW (ref 0.30–0.70)

## 2023-11-26 MED ORDER — HEPARIN (PORCINE) 25000 UT/250ML-% IV SOLN
1100.0000 [IU]/h | INTRAVENOUS | Status: AC
Start: 2023-11-26 — End: 2023-11-27
  Administered 2023-11-26: 950 [IU]/h via INTRAVENOUS
  Filled 2023-11-26: qty 250

## 2023-11-26 NOTE — Progress Notes (Signed)
 PHARMACY - ANTICOAGULATION CONSULT NOTE  Pharmacy Consult for heparin  Indication: leg ischemia  Allergies  Allergen Reactions   Lovastatin Itching and Rash   Nitrofurantoin Rash    Patient Measurements: Height: (P) 5\' 7"  (170.2 cm) Weight: (P) 83.9 kg (185 lb) IBW/kg (Calculated) : (P) 61.6 HEPARIN  DW (KG): (P) 79.1  Vital Signs: Temp: 98.9 F (37.2 C) (04/26 0833) Temp Source: Oral (04/26 0833) BP: 109/78 (04/26 0833) Pulse Rate: 82 (04/26 0833)  Labs: Recent Labs    11/23/23 2118 11/24/23 0708 11/24/23 2034 11/25/23 0436 11/25/23 1847  HGB 7.8* 6.5* 8.8* 7.4* 9.0*  HCT 24.1* 19.9* 26.7* 22.7* 27.9*  PLT 160 131*  --  137*  --   HEPARINUNFRC 0.82* 0.42  --   --   --   CREATININE  --  1.14*  --   --   --     Estimated Creatinine Clearance: 47.2 mL/min (A) (by C-G formula based on SCr of 1.14 mg/dL (H)).  Scheduled:   Assessment: 63 YOF presented with numbness in the right foot, found to have acute on chronic limb ischemia. PMH significant for HLD and PAD. Patient not on anticoagulation prior to hospitalization. Pharmacy consulted for heparin  dosing.   Patient may now restart heparin  per [facility], was held for . Last documented therapeutic rate was 950 units/hr, will plan to re-initiate at this rate. Hgb 9.0 on most recent measurement, no updated value yet this morning.   Goal of Therapy:  Heparin  level 0.3-0.7 units/ml Monitor platelets by anticoagulation protocol: Yes   Plan:  Continue heparin  950 units/h Check level in 8 hours  Daily heparin  level and CBC Follow up transition to DOAC before discharge per [facility]    Leonora Ramus, PharmD, BCPS PGY2 Pharmacy Resident

## 2023-11-26 NOTE — Progress Notes (Signed)
 Physical Therapy Treatment Patient Details Name: Catherine Ramsey MRN: [id number] DOB: Dec 04, 1945 Today's Date: 11/26/2023   History of Present Illness 78 y.o. female presents to [facility] 11/15/23 for R LE pain and numbness. Pt admitted with R LE critical ischemia and moderate LE arterial disease. 4/16 R LE angiogram w/ thrombolysis. 4/17 repeat angiogram w/ unsuccessful recanalization of R TP trunk. 4/22 R fem bypass with PTFE and vein cuff. PMHx: PVD s/p LLE ATA laser atherectomy and balloon angioplasty (10/15/2021), T2DM, CKD stage IIIb, HTN, HLD, R THA    PT Comments  Patient seen initially and only able to transfer bed to chair due to 10/10 RLE pain. RN in to provide pain meds and PT returned after 30 minutes. Pt reported pain 7/10 and agreed to participate with ambulation and stair training. Patient able to step up/down step in her room with bed rail as handrail with CGA. She repeated a second time with correct sequencing. Returned to sitting up in recliner.    If plan is discharge home, recommend the following: A little help with walking and/or transfers;A little help with bathing/dressing/bathroom;Assistance with cooking/housework;Assist for transportation;Help with stairs or ramp for entrance   Can travel by private vehicle        Equipment Recommendations  Rolling walker (2 wheels)    Recommendations for Other Services       Precautions / Restrictions Precautions Precautions: Fall Restrictions Weight Bearing Restrictions Per Provider Order: No     Mobility  Bed Mobility               General bed mobility comments: up at EOB on arrival    Transfers Overall transfer level: Needs assistance Equipment used: Rolling walker (2 wheels) Transfers: Sit to/from Stand, Bed to chair/wheelchair/BSC Sit to Stand: Contact guard assist   Step pivot transfers: Contact guard assist       General transfer comment: stood from EOB with cues only; too painful initially to walk, so  transferred to chair, requested pain medicine and PT returned ~30 minutes later; stood from recliner without cues or assist    Ambulation/Gait Ambulation/Gait assistance: Contact guard assist Gait Distance (Feet): 25 Feet Assistive device: Rolling walker (2 wheels) Gait Pattern/deviations: Decreased stance time - right, Decreased weight shift to right, Shuffle, Step-to pattern, Decreased stride length, Antalgic Gait velocity: decr Gait velocity interpretation: <1.8 ft/sec, indicate of risk for recurrent falls   General Gait Details: occasional cues for sequencing and to not step RLE beyond front wheels; good use of UEs for off-loading RLE   Stairs   Stairs assistance: Contact guard assist Stair Management: Step to pattern, One rail Right, Forwards Number of Stairs: 2 General stair comments: used step stool in room with bed rail as handrail. CGA for safety with cues to step up with LLE and down with R LE   Wheelchair Mobility     Tilt Bed    Modified Rankin (Stroke Patients Only)       Balance Overall balance assessment: Needs assistance, Mild deficits observed, not formally tested Sitting-balance support: No upper extremity supported, Feet supported Sitting balance-Leahy Scale: Fair     Standing balance support: Bilateral upper extremity supported, During functional activity, Reliant on assistive device for balance Standing balance-Leahy Scale: Poor Standing balance comment: reliant on BUE support                            Communication Communication Communication: No apparent difficulties  Cognition Arousal: Alert  Behavior During Therapy: WFL for tasks assessed/performed   PT - Cognitive impairments: Problem solving, Sequencing                       PT - Cognition Comments: continues to require cues for hand placement during transfers with RW Following commands: Impaired Following commands impaired: Follows one step commands with increased  time    Cueing Cueing Techniques: Verbal cues, Tactile cues  Exercises General Exercises - Lower Extremity Ankle Circles/Pumps: AROM, Both, 10 reps Other Exercises Other Exercises: seated Rt knee flexion (contract/relax) x 3 reps    General Comments        Pertinent Vitals/Pain Pain Assessment Pain Assessment: 0-10 Pain Score: 7  Pain Location: R LE Pain Descriptors / Indicators: Discomfort, Sore Pain Intervention(s): Limited activity within patient's tolerance, Monitored during session, Premedicated before session, Repositioned    Home Living                          Prior Function            PT Goals (current goals can now be found in the care plan section) Acute Rehab PT Goals Patient Stated Goal: to get better PT Goal Formulation: With patient Time For Goal Achievement: 12/04/23 Potential to Achieve Goals: Good Progress towards PT goals: Progressing toward goals    Frequency    Min 2X/week      PT Plan      Co-evaluation              AM-PAC PT "6 Clicks" Mobility   Outcome Measure  Help needed turning from your back to your side while in a flat bed without using bedrails?: A Little Help needed moving from lying on your back to sitting on the side of a flat bed without using bedrails?: A Little Help needed moving to and from a bed to a chair (including a wheelchair)?: A Little Help needed standing up from a chair using your arms (e.g., wheelchair or bedside chair)?: A Little Help needed to walk in hospital room?: A Little Help needed climbing 3-5 steps with a railing? : A Little 6 Click Score: 18    End of Session Equipment Utilized During Treatment: Gait belt Activity Tolerance: Patient tolerated treatment well Patient left: in chair;with call bell/phone within reach;with family/visitor present Nurse Communication: Mobility status PT Visit Diagnosis: Other abnormalities of gait and mobility (R26.89);Muscle weakness (generalized)  (M62.81)     Time: 6269-4854 PT Time Calculation (min) (ACUTE ONLY): 23 min  Charges:    $Gait Training: 23-37 mins PT General Charges $$ ACUTE PT VISIT: 1 Visit                      Gayle Kava, PT Acute Rehabilitation Services  Office [phone]    Guilford Leep 11/26/2023, 11:14 AM

## 2023-11-26 NOTE — Progress Notes (Addendum)
  Progress Note    11/26/2023 6:57 AM 4 Days Post-Op  Subjective:  denies any burning with voiding; denies any shortness of breath.  Says her leg felt a little heavy but better.    Tm 100.1 now 99.9 HR 80's-100's  140's-160's systolic 100% RA  Vitals:   11/25/23 2350 11/26/23 0405  BP: (!) 140/71 (!) 148/86  Pulse: 94 88  Resp: 18 16  Temp: 100.1 F (37.8 C) 99.9 F (37.7 C)  SpO2: 95% 100%    Physical Exam: General:  no distress Cardiac:  regular Lungs:  non labored Incisions:  right groin and right lower leg incision looks good.   Extremities:  brisk right peroneal doppler flow; calf soft; right foot warm Abdomen:  soft  CBC    Component Value Date/Time   WBC 14.0 (H) 11/25/2023 0436   RBC 2.70 (L) 11/25/2023 0436   HGB 9.0 (L) 11/25/2023 1847   HCT 27.9 (L) 11/25/2023 1847   PLT 137 (L) 11/25/2023 0436   MCV 84.1 11/25/2023 0436   MCH 27.4 11/25/2023 0436   MCHC 32.6 11/25/2023 0436   RDW 14.0 11/25/2023 0436   LYMPHSABS 3.2 11/15/2023 1546   MONOABS 0.5 11/15/2023 1546   EOSABS 0.2 11/15/2023 1546   BASOSABS 0.1 11/15/2023 1546    BMET    Component Value Date/Time   NA 135 11/24/2023 0708   NA 143 03/26/2019 1403   K 3.9 11/24/2023 0708   CL 103 11/24/2023 0708   CO2 22 11/24/2023 0708   GLUCOSE 143 (H) 11/24/2023 0708   BUN 20 11/24/2023 0708   BUN 11 03/26/2019 1403   CREATININE 1.14 (H) 11/24/2023 0708   CALCIUM  8.2 (L) 11/24/2023 0708   GFRNONAA 50 (L) 11/24/2023 0708   GFRAA 56 (L) 03/26/2019 1403    INR    Component Value Date/Time   INR 1.2 11/15/2023 1546     Intake/Output Summary (Last 24 hours) at 11/26/2023 0657 Last data filed at 11/25/2023 2101 Gross per 24 hour  Intake 240 ml  Output --  Net 240 ml      Assessment/Plan:  78 y.o. female is s/p:  Right common femoral to peroneal artery bypass with PTFE and vein cuff for rest pain   4 Days Post-Op   -pt continues to have brisk right peroneal doppler flow.   -right  calf remains soft-ace wrap re-applied.   -still with low grade fever-given PTFE, will get CXR and check u/a. Continue to encourage IS every hour-ordered yesterday but not at bedside.  -dry gauze to right groin to help prevent wound infection.   -acute blood loss anemia-hgb 9.0 last evening.   -DVT prophylaxis:  ok to restart heparin  gtt today and transition to DOAC before discharge.   Maryanna Smart, PA-C [facility] [phone] 11/26/2023 6:57 AM  I have seen and evaluated the patient. I agree with the PA note as documented above.  Status post right common femoral to peroneal bypass with PTFE.  Working with therapy.  Hemoglobin 9.  Seems to be making good progress.  Good peroneal signal at the ankle.  Young Hensen, MD [facility] [place] Office: [phone]

## 2023-11-26 NOTE — Progress Notes (Signed)
 Mobility Specialist Progress Note:   11/26/23 1130  Mobility  Activity Stood at bedside  Level of Assistance Contact guard assist, steadying assist  Assistive Device Front wheel walker  Activity Response Tolerated well  Mobility Referral Yes  Mobility visit 1 Mobility  Mobility Specialist Start Time (ACUTE ONLY) [id number]  Mobility Specialist Stop Time (ACUTE ONLY) 0933  Mobility Specialist Time Calculation (min) (ACUTE ONLY) 7 min   Pt received in bed, agreeable to mobility. SB bed mobility. CG to stand. Upon standing pt c/o RLE pain. Pt returned EOB d/t PT taking over session. Pt left EOB with PT still present in room for therapy.   Catherine Ramsey  Mobility Specialist Please contact via [employer] office at [phone]

## 2023-11-26 NOTE — Progress Notes (Signed)
 PHARMACY - ANTICOAGULATION CONSULT NOTE  Pharmacy Consult for heparin  Indication: leg ischemia  Allergies  Allergen Reactions   Lovastatin Itching and Rash   Nitrofurantoin Rash    Patient Measurements: Height: (P) 5\' 7"  (170.2 cm) Weight: (P) 83.9 kg (185 lb) IBW/kg (Calculated) : (P) 61.6 HEPARIN  DW (KG): (P) 79.1  Vital Signs: Temp: 97.9 F (36.6 C) (04/26 1923) Temp Source: Oral (04/26 1923) BP: 151/67 (04/26 1923) Pulse Rate: 16 (04/26 1549)  Labs: Recent Labs    11/24/23 0708 11/24/23 2034 11/25/23 0436 11/25/23 1847 11/26/23 1004 11/26/23 1704 11/26/23 2056  HGB 6.5*   < > 7.4* 9.0* 9.2*  --   --   HCT 19.9*   < > 22.7* 27.9* 28.6*  --   --   PLT 131*  --  137*  --  189  --   --   HEPARINUNFRC 0.42  --   --   --   --  0.10* 0.17*  CREATININE 1.14*  --   --   --   --   --   --    < > = values in this interval not displayed.    Estimated Creatinine Clearance: 47.2 mL/min (A) (by C-G formula based on SCr of 1.14 mg/dL (H)).  Scheduled:   Assessment: 76 YOF presented with numbness in the right foot, found to have acute on chronic limb ischemia. PMH significant for HLD and PAD. Patient not on anticoagulation prior to hospitalization. Pharmacy consulted for heparin  dosing.   Heparin  level subtherapeutic (0.21) on infusion at 950 units/hr. No issues with line or bleeding reported per RN.  Goal of Therapy:  Heparin  level 0.3-0.7 units/ml Monitor platelets by anticoagulation protocol: Yes   Plan:  Increase heparin  to 1100 units/hr Check level in 8 hours   Enrigue Harvard, PharmD, BCPS Please see amion for complete clinical pharmacist phone list 11/26/2023 9:26 PM

## 2023-11-26 NOTE — Progress Notes (Signed)
 Progress Note    Catherine Ramsey   NFA:[id number]  DOB: November 15, 1945  DOA: 11/15/2023     11 PCP: Bertha Broad, MD  Initial CC: RLE pain  Hospital Course: BEAUTON FLOURNOY is a 78 y.o. female with medical history significant for PVD s/p LLE ATA laser atherectomy and balloon angioplasty (10/15/2021), T2DM, CKD stage IIIa, HTN, HLD who is admitted with acute on chronic critical limb ischemia of the right lower extremity. Vascular surgery was consulted. 4/16 underwent angiography and tPA therapy. 4/17 repeat angiogram, unsuccessful recanalization of right TP trunk. 4/18 transferred to floor. 4/22 underwent right common femoral to peroneal artery bypass  Assessment and Plan: Acute on chronic critical limb ischemia right lower extremity History of intervention to the left anterior tibial artery 2023 by IR CTA runoff study shows acute occlusive thrombus of the right popliteal artery behind the knee, moderate to severe stenosis above the knee. Vascular surgery was consulted. Underwent angiography on 4/16, treated with tPA for below popliteal stenosis. On 4/17 repeat angiography, remaining occlusion appeared to have chronic component and was not amenable to intervention.. Vascular surgery recommended and patient underwent right femoral-peroneal bypass on 4/22. - heparin  held 4/24; 1 unit PRBC given for Hgb 6.5 g/dL - Hgb now improved and holding stable - heparin  resumed; will watch on heparin  for 24 hrs then likely can transition to DOAC on Sunday if Hgb still stable  Normocytic anemia: Baseline hemoglobin appears to be around 11 - s/p 1 unit PRBC on 4/24 - see above   Low grade fever Leukocytosis - likely some atelectasis from prolonged recovery; low suspicion for infection - CXR clear - no urinary sxms, but UA ordered per vascular; will followup - for now encourage ambulation as able and IS use   Type 2 diabetes melitis uncontrolled with hyperglycemia without long-term insulin   use. Hold home meds and placed on SSI.  On Lantus  at home.  Resume Lantus  on 4/23. Hemoglobin A1c 9.8.   - modifying regimen as necessary   CKD stage IIIa: - patient has history of CKD3b. Baseline creat ~ 1, eGFR~ 50-55   Hypertension: Hold losartan , hydrochlorothiazide , amlodipine    Hyperlipidemia: Resume rosuvastatin  and Zetia   Interval History:  No events overnight. Doing okay this morning. Friend bedside. Hgb stable and heparin  being resumed.   Old records reviewed in assessment of this patient  Antimicrobials:   DVT prophylaxis:  Heparin  drip   Code Status:   Code Status: Full Code  Mobility Assessment (Last 72 Hours)     Mobility Assessment     Row Name 11/26/23 1104 11/25/23 2000 11/25/23 1500 11/25/23 0831 11/24/23 1945   Does patient have an order for bedrest or is patient medically unstable -- No - Continue assessment -- No - Continue assessment No - Continue assessment   What is the highest level of mobility based on the progressive mobility assessment? Level 5 (Walks with assist in room/hall) - Balance while stepping forward/back and can walk in room with assist - Complete Level 5 (Walks with assist in room/hall) - Balance while stepping forward/back and can walk in room with assist - Complete Level 5 (Walks with assist in room/hall) - Balance while stepping forward/back and can walk in room with assist - Complete Level 5 (Walks with assist in room/hall) - Balance while stepping forward/back and can walk in room with assist - Complete Level 5 (Walks with assist in room/hall) - Balance while stepping forward/back and can walk in room with assist - Complete  Is the above level different from baseline mobility prior to current illness? -- Yes - Recommend PT order -- Yes - Recommend PT order Yes - Recommend PT order    Row Name 11/24/23 1100 11/24/23 0939 11/23/23 1950 11/23/23 1620     Does patient have an order for bedrest or is patient medically unstable -- No -  Continue assessment No - Continue assessment No - Continue assessment    What is the highest level of mobility based on the progressive mobility assessment? Level 5 (Walks with assist in room/hall) - Balance while stepping forward/back and can walk in room with assist - Complete Level 5 (Walks with assist in room/hall) - Balance while stepping forward/back and can walk in room with assist - Complete Level 4 (Walks with assist in room) - Balance while marching in place and cannot step forward and back - Complete Level 3 (Stands with assist) - Balance while standing  and cannot march in place    Is the above level different from baseline mobility prior to current illness? -- Yes - Recommend PT order -- Yes - Recommend PT order             Barriers to discharge: none Disposition Plan:  Home  HH orders placed:  Status is: Inpt  Objective: Blood pressure (!) 174/88, pulse 85, temperature 99.3 F (37.4 C), temperature source Oral, resp. rate 18, height (P) 5\' 7"  (1.702 m), weight (P) 83.9 kg, SpO2 100%.  Examination:  Physical Exam Constitutional:      Appearance: Normal appearance.  HENT:     Head: Normocephalic and atraumatic.     Mouth/Throat:     Mouth: Mucous membranes are moist.  Eyes:     Extraocular Movements: Extraocular movements intact.  Cardiovascular:     Rate and Rhythm: Normal rate and regular rhythm.  Pulmonary:     Effort: Pulmonary effort is normal. No respiratory distress.     Breath sounds: Normal breath sounds. No wheezing.  Abdominal:     General: Bowel sounds are normal. There is no distension.     Palpations: Abdomen is soft.     Tenderness: There is no abdominal tenderness.  Musculoskeletal:     Cervical back: Normal range of motion and neck supple.     Right lower leg: Edema (noted from mid thigh to foot; does have tenderness located around leg; sugical incision clean with no surrounding signs of infection; no bleeding noted) present.  Skin:    General:  Skin is warm and dry.  Neurological:     General: No focal deficit present.     Mental Status: She is alert.  Psychiatric:        Mood and Affect: Mood normal.      Consultants:  Vascular surgery  Procedures:  4/22: Right common femoral to peroneal artery bypass with PTFE and vein cuff  Data Reviewed: Results for orders placed or performed during the hospital encounter of 11/15/23 (from the past 24 hours)  Glucose, capillary     Status: Abnormal   Collection Time: 11/25/23  3:40 PM  Result Value Ref Range   Glucose-Capillary 159 (H) 70 - 99 mg/dL  Hemoglobin and hematocrit, blood     Status: Abnormal   Collection Time: 11/25/23  6:47 PM  Result Value Ref Range   Hemoglobin 9.0 (L) 12.0 - 15.0 g/dL   HCT 16.1 (L) 09.6 - 04.5 %  Glucose, capillary     Status: Abnormal   Collection Time: 11/26/23  6:02 AM  Result Value Ref Range   Glucose-Capillary 131 (H) 70 - 99 mg/dL  CBC with Differential/Platelet     Status: Abnormal   Collection Time: 11/26/23 10:04 AM  Result Value Ref Range   WBC 15.8 (H) 4.0 - 10.5 K/uL   RBC 3.29 (L) 3.87 - 5.11 MIL/uL   Hemoglobin 9.2 (L) 12.0 - 15.0 g/dL   HCT 16.1 (L) 09.6 - 04.5 %   MCV 86.9 80.0 - 100.0 fL   MCH 28.0 26.0 - 34.0 pg   MCHC 32.2 30.0 - 36.0 g/dL   RDW 40.9 81.1 - 91.4 %   Platelets 189 150 - 400 K/uL   nRBC 0.2 0.0 - 0.2 %   Neutrophils Relative % 75 %   Neutro Abs 11.8 (H) 1.7 - 7.7 K/uL   Lymphocytes Relative 12 %   Lymphs Abs 2.0 0.7 - 4.0 K/uL   Monocytes Relative 11 %   Monocytes Absolute 1.8 (H) 0.1 - 1.0 K/uL   Eosinophils Relative 1 %   Eosinophils Absolute 0.1 0.0 - 0.5 K/uL   Basophils Relative 0 %   Basophils Absolute 0.0 0.0 - 0.1 K/uL   Immature Granulocytes 1 %   Abs Immature Granulocytes 0.11 (H) 0.00 - 0.07 K/uL    I have reviewed pertinent nursing notes, vitals, labs, and images as necessary. I have ordered labwork to follow up on as indicated.  I have reviewed the last notes from staff over past 24  hours. I have discussed patient's care plan and test results with nursing staff, CM/SW, and other staff as appropriate.  Time spent: Greater than 50% of the 55 minute visit was spent in counseling/coordination of care for the patient as laid out in the A&P.   LOS: 11 days   Catherine Homes, MD [facility] 11/26/2023, 2:25 PM

## 2023-11-27 DIAGNOSIS — I70221 Atherosclerosis of native arteries of extremities with rest pain, right leg: Secondary | ICD-10-CM | POA: Diagnosis not present

## 2023-11-27 LAB — CBC WITH DIFFERENTIAL/PLATELET
Abs Immature Granulocytes: 0.06 10*3/uL (ref 0.00–0.07)
Basophils Absolute: 0.1 10*3/uL (ref 0.0–0.1)
Basophils Relative: 0 %
Eosinophils Absolute: 0.4 10*3/uL (ref 0.0–0.5)
Eosinophils Relative: 3 %
HCT: 24.2 % — ABNORMAL LOW (ref 36.0–46.0)
Hemoglobin: 7.7 g/dL — ABNORMAL LOW (ref 12.0–15.0)
Immature Granulocytes: 1 %
Lymphocytes Relative: 24 %
Lymphs Abs: 2.9 10*3/uL (ref 0.7–4.0)
MCH: 27.7 pg (ref 26.0–34.0)
MCHC: 31.8 g/dL (ref 30.0–36.0)
MCV: 87.1 fL (ref 80.0–100.0)
Monocytes Absolute: 1.3 10*3/uL — ABNORMAL HIGH (ref 0.1–1.0)
Monocytes Relative: 10 %
Neutro Abs: 7.6 10*3/uL (ref 1.7–7.7)
Neutrophils Relative %: 62 %
Platelets: 204 10*3/uL (ref 150–400)
RBC: 2.78 MIL/uL — ABNORMAL LOW (ref 3.87–5.11)
RDW: 13.8 % (ref 11.5–15.5)
WBC: 12.3 10*3/uL — ABNORMAL HIGH (ref 4.0–10.5)
nRBC: 0.2 % (ref 0.0–0.2)

## 2023-11-27 LAB — URINALYSIS, COMPLETE (UACMP) WITH MICROSCOPIC
Bilirubin Urine: NEGATIVE
Glucose, UA: NEGATIVE mg/dL
Hgb urine dipstick: NEGATIVE
Ketones, ur: NEGATIVE mg/dL
Nitrite: NEGATIVE
Protein, ur: NEGATIVE mg/dL
Specific Gravity, Urine: 1.017 (ref 1.005–1.030)
pH: 5 (ref 5.0–8.0)

## 2023-11-27 LAB — GLUCOSE, CAPILLARY
Glucose-Capillary: 121 mg/dL — ABNORMAL HIGH (ref 70–99)
Glucose-Capillary: 106 mg/dL — ABNORMAL HIGH (ref 70–99)
Glucose-Capillary: 189 mg/dL — ABNORMAL HIGH (ref 70–99)
Glucose-Capillary: 208 mg/dL — ABNORMAL HIGH (ref 70–99)
Glucose-Capillary: 96 mg/dL (ref 70–99)

## 2023-11-27 LAB — HEPARIN LEVEL (UNFRACTIONATED): Heparin Unfractionated: 0.26 [IU]/mL — ABNORMAL LOW (ref 0.30–0.70)

## 2023-11-27 MED ORDER — GABAPENTIN 300 MG PO CAPS
300.0000 mg | ORAL_CAPSULE | Freq: Three times a day (TID) | ORAL | Status: DC
  Administered 2023-11-27 – 2023-11-28 (×4): 300 mg via ORAL
  Filled 2023-11-27 (×4): qty 1

## 2023-11-27 MED ORDER — APIXABAN 5 MG PO TABS
5.0000 mg | ORAL_TABLET | Freq: Two times a day (BID) | ORAL | Status: DC
  Administered 2023-11-27 – 2023-11-28 (×3): 5 mg via ORAL
  Filled 2023-11-27 (×3): qty 1

## 2023-11-27 NOTE — Discharge Instructions (Signed)
 Information on my medicine - ELIQUIS  (apixaban )  This medication education was reviewed with me or my healthcare representative as part of my discharge preparation.  Why was Eliquis  prescribed for you? Eliquis  was prescribed to treat blood clots that may have been found in the veins of your legs (deep vein thrombosis) or in your lungs (pulmonary embolism) and to reduce the risk of them occurring again.  What do You need to know about Eliquis  ? The dose is ONE 5 mg tablet taken TWICE daily.  Eliquis  may be taken with or without food.   Try to take the dose about the same time in the morning and in the evening. If you have difficulty swallowing the tablet whole please discuss with your pharmacist how to take the medication safely.  Take Eliquis  exactly as prescribed and DO NOT stop taking Eliquis  without talking to the doctor who prescribed the medication.  Stopping may increase your risk of developing a new blood clot.  Refill your prescription before you run out.  After discharge, you should have regular check-up appointments with your healthcare provider that is prescribing your Eliquis .    What do you do if you miss a dose? If a dose of ELIQUIS  is not taken at the scheduled time, take it as soon as possible on the same day and twice-daily administration should be resumed. The dose should not be doubled to make up for a missed dose.  Important Safety Information A possible side effect of Eliquis  is bleeding. You should call your healthcare provider right away if you experience any of the following: ? Bleeding from an injury or your nose that does not stop. ? Unusual colored urine (red or dark brown) or unusual colored stools (red or black). ? Unusual bruising for unknown reasons. ? A serious fall or if you hit your head (even if there is no bleeding).  Some medicines may interact with Eliquis  and might increase your risk of bleeding or clotting while on Eliquis . To help avoid  this, consult your healthcare provider or pharmacist prior to using any new prescription or non-prescription medications, including herbals, vitamins, non-steroidal anti-inflammatory drugs (NSAIDs) and supplements.  This website has more information on Eliquis  (apixaban ): [email or web address] .[email or web address] Ramsey

## 2023-11-27 NOTE — Progress Notes (Addendum)
  Progress Note    11/27/2023 7:17 AM 5 Days Post-Op  Subjective:  says she feels like the side of the right leg and foot were being scraped with a knife.  Says she did good walking.  Says she did give some urine yesterday.    Tm 99.1 Hr 60's-110's  120's-150's systolic 100% RA  Vitals:   11/26/23 2331 11/27/23 0415  BP: (!) 129/53 129/64  Pulse: 93 100  Resp: 19 18  Temp: 98.3 F (36.8 C) 99.1 F (37.3 C)  SpO2: 98% 100%    Physical Exam: General:  no distress Cardiac:  regular Lungs:  non labored Incisions:  right groin and right below knee incisions look good Extremities:  brisk right peroneal doppler flow; + RLE edema; calf remains soft. Abdomen:  soft  CBC    Component Value Date/Time   WBC 15.8 (H) 11/26/2023 1004   RBC 3.29 (L) 11/26/2023 1004   HGB 9.2 (L) 11/26/2023 1004   HCT 28.6 (L) 11/26/2023 1004   PLT 189 11/26/2023 1004   MCV 86.9 11/26/2023 1004   MCH 28.0 11/26/2023 1004   MCHC 32.2 11/26/2023 1004   RDW 13.9 11/26/2023 1004   LYMPHSABS 2.0 11/26/2023 1004   MONOABS 1.8 (H) 11/26/2023 1004   EOSABS 0.1 11/26/2023 1004   BASOSABS 0.0 11/26/2023 1004    BMET    Component Value Date/Time   NA 135 11/24/2023 0708   NA 143 03/26/2019 1403   K 3.9 11/24/2023 0708   CL 103 11/24/2023 0708   CO2 22 11/24/2023 0708   GLUCOSE 143 (H) 11/24/2023 0708   BUN 20 11/24/2023 0708   BUN 11 03/26/2019 1403   CREATININE 1.14 (H) 11/24/2023 0708   CALCIUM  8.2 (L) 11/24/2023 0708   GFRNONAA 50 (L) 11/24/2023 0708   GFRAA 56 (L) 03/26/2019 1403    INR    Component Value Date/Time   INR 1.2 11/15/2023 1546     Intake/Output Summary (Last 24 hours) at 11/27/2023 0717 Last data filed at 11/27/2023 0300 Gross per 24 hour  Intake 608.57 ml  Output --  Net 608.57 ml      Assessment/Plan:  78 y.o. female is s/p:  Right common femoral to peroneal artery bypass with PTFE and vein cuff for rest pain   5 Days Post-Op   -pt with brisk doppler  flow right peroneal -still with some edema in RLE improved with ace wrap.  Continue leg elevation with not up and about -low grade fever improving-CXR clear.  U/a ordered and she states she did void in cup but no results.    Leukocytosis yesterday-awaiting CBC today.  Incisions look good without evidence of infection.  -CBC ordered and not done.   -DVT prophylaxis:  heparin  gtt restarted yesterday   Maryanna Smart, PA-C [facility] [phone] 11/27/2023 7:17 AM  I have seen and evaluated the patient. I agree with the PA note as documented above.  Status post right common femoral to peroneal bypass with PTFE.  Brisk peroneal signal at the ankle.  All of her incisions look good.  Tolerating heparin  and can transition to DOAC today.  Leg wrapped for compression for swelling.  Incisions otherwise look good.  Young Hensen, MD [facility] Office: [phone]

## 2023-11-27 NOTE — Plan of Care (Signed)
  Problem: Education: Goal: Knowledge of General Education information will improve Description: Including pain rating scale, medication(s)/side effects and non-pharmacologic comfort measures Outcome: Progressing   Problem: Clinical Measurements: Goal: Ability to maintain clinical measurements within normal limits will improve Outcome: Progressing Goal: Will remain free from infection Outcome: Progressing Goal: Diagnostic test results will improve Outcome: Progressing Goal: Respiratory complications will improve Outcome: Progressing Goal: Cardiovascular complication will be avoided Outcome: Progressing   Problem: Health Behavior/Discharge Planning: Goal: Ability to manage health-related needs will improve Outcome: Progressing   Problem: Education: Goal: Understanding of CV disease, CV risk reduction, and recovery process will improve Outcome: Progressing Goal: Individualized Educational Video(s) Outcome: Progressing

## 2023-11-27 NOTE — Progress Notes (Signed)
 Progress Note    Catherine Ramsey   ZOX:[id number]  DOB: 1946-04-11  DOA: 11/15/2023     12 PCP: Bertha Broad, MD  Initial CC: RLE pain  Hospital Course: Catherine Ramsey is a 78 y.o. female with medical history significant for PVD s/p LLE ATA laser atherectomy and balloon angioplasty (10/15/2021), T2DM, CKD stage IIIa, HTN, HLD who is admitted with acute on chronic critical limb ischemia of the right lower extremity. Vascular surgery was consulted. 4/16 underwent angiography and tPA therapy. 4/17 repeat angiogram, unsuccessful recanalization of right TP trunk. 4/18 transferred to floor. 4/22 underwent right common femoral to peroneal artery bypass  Assessment and Plan: Acute on chronic critical limb ischemia right lower extremity History of intervention to the left anterior tibial artery 2023 by IR CTA runoff study shows acute occlusive thrombus of the right popliteal artery behind the knee, moderate to severe stenosis above the knee. Vascular surgery was consulted. Underwent angiography on 4/16, treated with tPA for below popliteal stenosis. On 4/17 repeat angiography, remaining occlusion appeared to have chronic component and was not amenable to intervention.. Vascular surgery recommended and patient underwent right femoral-peroneal bypass on 4/22. - heparin  held 4/24; 1 unit PRBC given for Hgb 6.5 g/dL - Hgb now improved; some downtrend still today but leg remains softer - vascular transitioning to Eliquis off heparin  today  Normocytic anemia: Baseline hemoglobin appears to be around 11 - s/p 1 unit PRBC on 4/24 - see above   Low grade fever - resolved Leukocytosis - improving - likely some atelectasis from prolonged recovery; low suspicion for infection - CXR clear - no urinary sxms, but UA ordered per vascular; will followup - for now encourage ambulation as able and IS use   Type 2 diabetes melitis uncontrolled with hyperglycemia without long-term insulin   use. Hold home meds and placed on SSI.  On Lantus  at home.  Resume Lantus  on 4/23. Hemoglobin A1c 9.8.   - modifying regimen as necessary   CKD stage IIIa: - patient has history of CKD3b. Baseline creat ~ 1, eGFR~ 50-55   Hypertension: Hold losartan , hydrochlorothiazide , amlodipine    Hyperlipidemia: Resume rosuvastatin  and Zetia   Interval History:  Complaining of some neuropathic pains in her leg this morning.  She is on gabapentin  at home which has been held and will resume today.  Old records reviewed in assessment of this patient  Antimicrobials:   DVT prophylaxis:   apixaban (ELIQUIS) tablet 5 mg   Code Status:   Code Status: Full Code  Mobility Assessment (Last 72 Hours)     Mobility Assessment     Row Name 11/26/23 2000 11/26/23 1104 11/26/23 0715 11/25/23 2000 11/25/23 1500   Does patient have an order for bedrest or is patient medically unstable No - Continue assessment -- No - Continue assessment No - Continue assessment --   What is the highest level of mobility based on the progressive mobility assessment? Level 5 (Walks with assist in room/hall) - Balance while stepping forward/back and can walk in room with assist - Complete Level 5 (Walks with assist in room/hall) - Balance while stepping forward/back and can walk in room with assist - Complete Level 4 (Walks with assist in room) - Balance while marching in place and cannot step forward and back - Complete Level 5 (Walks with assist in room/hall) - Balance while stepping forward/back and can walk in room with assist - Complete Level 5 (Walks with assist in room/hall) - Balance while stepping forward/back and can  walk in room with assist - Complete   Is the above level different from baseline mobility prior to current illness? Yes - Recommend PT order -- Yes - Recommend PT order Yes - Recommend PT order --    Row Name 11/25/23 0831 11/24/23 1945         Does patient have an order for bedrest or is patient  medically unstable No - Continue assessment No - Continue assessment      What is the highest level of mobility based on the progressive mobility assessment? Level 5 (Walks with assist in room/hall) - Balance while stepping forward/back and can walk in room with assist - Complete Level 5 (Walks with assist in room/hall) - Balance while stepping forward/back and can walk in room with assist - Complete      Is the above level different from baseline mobility prior to current illness? Yes - Recommend PT order Yes - Recommend PT order               Barriers to discharge: none Disposition Plan:  Home  HH orders placed:  Status is: Inpt  Objective: Blood pressure (!) 125/55, pulse 73, temperature 98.1 F (36.7 C), temperature source Oral, resp. rate 11, height (P) 5\' 7"  (1.702 m), weight (P) 83.9 kg, SpO2 97%.  Examination:  Physical Exam Constitutional:      Appearance: Normal appearance.  HENT:     Head: Normocephalic and atraumatic.     Mouth/Throat:     Mouth: Mucous membranes are moist.  Eyes:     Extraocular Movements: Extraocular movements intact.  Cardiovascular:     Rate and Rhythm: Normal rate and regular rhythm.  Pulmonary:     Effort: Pulmonary effort is normal. No respiratory distress.     Breath sounds: Normal breath sounds. No wheezing.  Abdominal:     General: Bowel sounds are normal. There is no distension.     Palpations: Abdomen is soft.     Tenderness: There is no abdominal tenderness.  Musculoskeletal:     Cervical back: Normal range of motion and neck supple.     Right lower leg: Edema (noted from mid thigh to foot; does have tenderness located around leg; sugical incision clean with no surrounding signs of infection; no bleeding noted) present.  Skin:    General: Skin is warm and dry.  Neurological:     General: No focal deficit present.     Mental Status: She is alert.  Psychiatric:        Mood and Affect: Mood normal.      Consultants:  Vascular  surgery  Procedures:  4/22: Right common femoral to peroneal artery bypass with PTFE and vein cuff  Data Reviewed: Results for orders placed or performed during the hospital encounter of 11/15/23 (from the past 24 hours)  Glucose, capillary     Status: Abnormal   Collection Time: 11/26/23  3:48 PM  Result Value Ref Range   Glucose-Capillary 142 (H) 70 - 99 mg/dL  Heparin  level (unfractionated)     Status: Abnormal   Collection Time: 11/26/23  5:04 PM  Result Value Ref Range   Heparin  Unfractionated 0.10 (L) 0.30 - 0.70 IU/mL  Glucose, capillary     Status: Abnormal   Collection Time: 11/26/23  8:50 PM  Result Value Ref Range   Glucose-Capillary 68 (L) 70 - 99 mg/dL  Heparin  level (unfractionated)     Status: Abnormal   Collection Time: 11/26/23  8:56 PM  Result Value  Ref Range   Heparin  Unfractionated 0.17 (L) 0.30 - 0.70 IU/mL  Glucose, capillary     Status: Abnormal   Collection Time: 11/27/23  5:54 AM  Result Value Ref Range   Glucose-Capillary 121 (H) 70 - 99 mg/dL  Glucose, capillary     Status: Abnormal   Collection Time: 11/27/23  7:53 AM  Result Value Ref Range   Glucose-Capillary 106 (H) 70 - 99 mg/dL  CBC with Differential/Platelet     Status: Abnormal   Collection Time: 11/27/23  8:18 AM  Result Value Ref Range   WBC 12.3 (H) 4.0 - 10.5 K/uL   RBC 2.78 (L) 3.87 - 5.11 MIL/uL   Hemoglobin 7.7 (L) 12.0 - 15.0 g/dL   HCT 16.1 (L) 09.6 - 04.5 %   MCV 87.1 80.0 - 100.0 fL   MCH 27.7 26.0 - 34.0 pg   MCHC 31.8 30.0 - 36.0 g/dL   RDW 40.9 81.1 - 91.4 %   Platelets 204 150 - 400 K/uL   nRBC 0.2 0.0 - 0.2 %   Neutrophils Relative % 62 %   Neutro Abs 7.6 1.7 - 7.7 K/uL   Lymphocytes Relative 24 %   Lymphs Abs 2.9 0.7 - 4.0 K/uL   Monocytes Relative 10 %   Monocytes Absolute 1.3 (H) 0.1 - 1.0 K/uL   Eosinophils Relative 3 %   Eosinophils Absolute 0.4 0.0 - 0.5 K/uL   Basophils Relative 0 %   Basophils Absolute 0.1 0.0 - 0.1 K/uL   Immature Granulocytes 1 %   Abs  Immature Granulocytes 0.06 0.00 - 0.07 K/uL  Heparin  level (unfractionated)     Status: Abnormal   Collection Time: 11/27/23  8:18 AM  Result Value Ref Range   Heparin  Unfractionated 0.26 (L) 0.30 - 0.70 IU/mL  Glucose, capillary     Status: Abnormal   Collection Time: 11/27/23 12:06 PM  Result Value Ref Range   Glucose-Capillary 189 (H) 70 - 99 mg/dL    I have reviewed pertinent nursing notes, vitals, labs, and images as necessary. I have ordered labwork to follow up on as indicated.  I have reviewed the last notes from staff over past 24 hours. I have discussed patient's care plan and test results with nursing staff, CM/SW, and other staff as appropriate.  Time spent: Greater than 50% of the 55 minute visit was spent in counseling/coordination of care for the patient as laid out in the A&P.   LOS: 12 days   Faith Homes, MD [facility] 11/27/2023, 1:57 PM

## 2023-11-27 NOTE — Progress Notes (Addendum)
 PHARMACY - ANTICOAGULATION CONSULT NOTE  Pharmacy Consult for heparin  to DOAC Indication: leg ischemia  Allergies  Allergen Reactions   Lovastatin Itching and Rash   Nitrofurantoin Rash    Patient Measurements: Height: (P) 5\' 7"  (170.2 cm) Weight: (P) 83.9 kg (185 lb) IBW/kg (Calculated) : (P) 61.6 HEPARIN  DW (KG): (P) 79.1  Vital Signs: Temp: 98 F (36.7 C) (04/27 0751) Temp Source: Oral (04/27 0751) BP: 134/60 (04/27 0751) Pulse Rate: 77 (04/27 0751)  Labs: Recent Labs    11/25/23 0436 11/25/23 1847 11/26/23 1004 11/26/23 1704 11/26/23 2056 11/27/23 0818  HGB 7.4* 9.0* 9.2*  --   --  7.7*  HCT 22.7* 27.9* 28.6*  --   --  24.2*  PLT 137*  --  189  --   --  204  HEPARINUNFRC  --   --   --  0.10* 0.17* 0.26*    Estimated Creatinine Clearance: 47.2 mL/min (A) (by C-G formula based on SCr of 1.14 mg/dL (H)).  Scheduled:   Assessment: 55 YOF presented with numbness in the right foot, found to have acute on chronic limb ischemia. PMH significant for HLD and PAD. Patient not on anticoagulation prior to hospitalization. Pharmacy consulted for heparin  dosing.   4/27 AM: Heparin  level subtherapeutic at 0.26 on heparin  1100 units/hr, hgb 7.7, MD aware and is ok to transition to DOAC today.    Goal of Therapy:  Heparin  level 0.3-0.7 units/ml Monitor platelets by anticoagulation protocol: Yes   Plan:  Start eliquis  Stop heparin   Monitor CBC and platelets  Follow up education and copay check   Leonora Ramus, PharmD, BCPS PGY2 Pharmacy Resident

## 2023-11-27 NOTE — Progress Notes (Signed)
 Mobility Specialist Progress Note:   11/27/23 1151  Mobility  Activity Ambulated with assistance to bathroom;Transferred from bed to chair  Level of Assistance Contact guard assist, steadying assist  Assistive Device Front wheel walker  Distance Ambulated (ft) 30 ft  Activity Response Tolerated well  Mobility Referral Yes  Mobility visit 1 Mobility  Mobility Specialist Start Time (ACUTE ONLY) 0850  Mobility Specialist Stop Time (ACUTE ONLY) 0910  Mobility Specialist Time Calculation (min) (ACUTE ONLY) 20 min   Pre Mobility: 96 HR  During Mobility: 118 HR  Pt received in bed, agreeable to mobility. C/o RLE pain upon standing. Rated pain 8/10. Pt was able to ambulate to BR with CG. Void successful. Pt independent with pericare. Pt left in chair with call bell in reach and all needs met.   Sofia Dunn  Mobility Specialist Please contact via [employer] office at [phone]

## 2023-11-28 ENCOUNTER — Other Ambulatory Visit (HOSPITAL_COMMUNITY): Payer: Self-pay

## 2023-11-28 DIAGNOSIS — I70221 Atherosclerosis of native arteries of extremities with rest pain, right leg: Secondary | ICD-10-CM | POA: Diagnosis not present

## 2023-11-28 DIAGNOSIS — N1831 Chronic kidney disease, stage 3a: Secondary | ICD-10-CM | POA: Diagnosis not present

## 2023-11-28 LAB — CBC WITH DIFFERENTIAL/PLATELET
Abs Immature Granulocytes: 0.07 10*3/uL (ref 0.00–0.07)
Basophils Absolute: 0 10*3/uL (ref 0.0–0.1)
Basophils Relative: 0 %
Eosinophils Absolute: 0.3 10*3/uL (ref 0.0–0.5)
Eosinophils Relative: 3 %
HCT: 24.4 % — ABNORMAL LOW (ref 36.0–46.0)
Hemoglobin: 7.9 g/dL — ABNORMAL LOW (ref 12.0–15.0)
Immature Granulocytes: 1 %
Lymphocytes Relative: 28 %
Lymphs Abs: 2.8 10*3/uL (ref 0.7–4.0)
MCH: 28.3 pg (ref 26.0–34.0)
MCHC: 32.4 g/dL (ref 30.0–36.0)
MCV: 87.5 fL (ref 80.0–100.0)
Monocytes Absolute: 0.9 10*3/uL (ref 0.1–1.0)
Monocytes Relative: 9 %
Neutro Abs: 5.9 10*3/uL (ref 1.7–7.7)
Neutrophils Relative %: 59 %
Platelets: 232 10*3/uL (ref 150–400)
RBC: 2.79 MIL/uL — ABNORMAL LOW (ref 3.87–5.11)
RDW: 13.7 % (ref 11.5–15.5)
WBC: 9.9 10*3/uL (ref 4.0–10.5)
nRBC: 0 % (ref 0.0–0.2)

## 2023-11-28 LAB — GLUCOSE, CAPILLARY
Glucose-Capillary: 204 mg/dL — ABNORMAL HIGH (ref 70–99)
Glucose-Capillary: 175 mg/dL — ABNORMAL HIGH (ref 70–99)
Glucose-Capillary: 103 mg/dL — ABNORMAL HIGH (ref 70–99)

## 2023-11-28 MED ORDER — APIXABAN 5 MG PO TABS
5.0000 mg | ORAL_TABLET | Freq: Two times a day (BID) | ORAL | 3 refills | Status: DC
  Filled 2023-11-28: qty 60, 30d supply, fill #0

## 2023-11-28 MED ORDER — GABAPENTIN 100 MG PO CAPS
100.0000 mg | ORAL_CAPSULE | Freq: Three times a day (TID) | ORAL | 3 refills | Status: AC
  Filled 2023-11-28: qty 90, 30d supply, fill #0

## 2023-11-28 MED ORDER — OXYCODONE HCL 5 MG PO TABS
5.0000 mg | ORAL_TABLET | Freq: Four times a day (QID) | ORAL | 0 refills | Status: DC | PRN
  Filled 2023-11-28: qty 20, 5d supply, fill #0

## 2023-11-28 NOTE — Discharge Summary (Signed)
 Physician Discharge Summary   Catherine Ramsey:[id number] DOB: [birth year] DOA: 11/15/2023  PCP: Bertha Broad, MD  Admit date: 11/15/2023 Discharge date: 11/28/2023  Admitted From: Home Disposition: Home Discharging physician: Faith Homes, MD Barriers to discharge: None  Recommendations at discharge: Follow-up with vascular surgery  Home Health: PT, OT Equipment/Devices: Bedside commode, rolling walker  Discharge Condition: stable CODE STATUS: Full  Diet recommendation:  Diet Orders (From admission, onward)     Start     Ordered   11/28/23 0000  Diet - low sodium heart healthy        11/28/23 1030   11/28/23 0000  Diet Carb Modified        11/28/23 1030   11/22/23 1700  Diet heart healthy/carb modified Room service appropriate? Yes; Fluid consistency: Thin  Diet effective now       Question Answer Comment  Diet-HS Snack? Nothing   Room service appropriate? Yes   Fluid consistency: Thin      11/22/23 1700            Hospital Course: Catherine Ramsey is a 78 y.o. female with medical history significant for PVD s/p LLE ATA laser atherectomy and balloon angioplasty (10/15/2021), T2DM, CKD stage IIIa, HTN, HLD who is admitted with acute on chronic critical limb ischemia of the right lower extremity. Vascular surgery was consulted. 4/16 underwent angiography and tPA therapy. 4/17 repeat angiogram, unsuccessful recanalization of right TP trunk. 4/18 transferred to floor. 4/22 underwent right common femoral to peroneal artery bypass  Assessment and Plan: Acute on chronic critical limb ischemia right lower extremity History of intervention to the left anterior tibial artery 2023 by IR CTA runoff study shows acute occlusive thrombus of the right popliteal artery behind the knee, moderate to severe stenosis above the knee. Vascular surgery was consulted. Underwent angiography on 4/16, treated with tPA for below popliteal stenosis. On 4/17 repeat angiography,  remaining occlusion appeared to have chronic component and was not amenable to intervention.. Vascular surgery recommended and patient underwent right femoral-peroneal bypass on 4/22. - heparin  held 4/24; 1 unit PRBC given for Hgb 6.5 g/dL - Hgb stable at d/c, 7.9 g/dL  Normocytic anemia: Baseline hemoglobin appears to be around 11 - s/p 1 unit PRBC on 4/24 - see above   Low grade fever - resolved Leukocytosis - resolved  - likely some atelectasis from prolonged recovery; low suspicion for infection - CXR clear; UA negative for infection - for now encourage ambulation as able and IS use   Type 2 diabetes melitis uncontrolled with hyperglycemia without long-term insulin  use. Hold home meds and placed on SSI.  On Lantus  at home.  Resume Lantus  on 4/23. Hemoglobin A1c 9.8.   - Resume home regimen   CKD stage IIIa: - patient has history of CKD3b. Baseline creat ~ 1, eGFR~ 50-55   Hypertension: -Resume home regimen   Hyperlipidemia: Resume rosuvastatin  and Zetia    The patient's acute and chronic medical conditions were treated accordingly. On day of discharge, patient was felt deemed stable for discharge. Patient/family member advised to call PCP or come back to ER if needed.   Principal Diagnosis: Critical limb ischemia of right lower extremity [facility])  Discharge Diagnoses: Active Hospital Problems   Diagnosis Date Noted   Critical limb ischemia of right lower extremity (HCC) 11/15/2023    Priority: 1.   Chronic kidney disease, stage 3a (HCC) 11/15/2023   Normocytic anemia 11/15/2023   Peripheral arterial disease (HCC) 05/05/2021   Type 2  diabetes mellitus with obesity (HCC) 06/22/2019   Hyperlipidemia associated with type 2 diabetes mellitus (HCC) 06/22/2019   Hypertension associated with diabetes (HCC) 02/25/2019    Resolved Hospital Problems  No resolved problems to display.     Discharge Instructions     Diet - low sodium heart healthy   Complete by: As directed     Diet Carb Modified   Complete by: As directed    Discharge wound care:   Complete by: As directed    1) Keep right leg from toes to knee wrapped with ACE compression bandage every morning and ok to take off at night.  Elevate legs when not up and about.   2) Dry gauze to right groin to wick moisture and help prevent wound infection.   Increase activity slowly   Complete by: As directed       Allergies as of 11/28/2023       Reactions   Lovastatin Itching, Rash   Nitrofurantoin Rash        Medication List     STOP taking these medications    doxycycline  100 MG capsule Commonly known as: VIBRAMYCIN        TAKE these medications    acetaminophen  500 MG tablet Commonly known as: TYLENOL  Take 1,000 mg by mouth every 6 (six) hours as needed for moderate pain.   amLODipine  10 MG tablet Commonly known as: NORVASC  Take 10 mg by mouth daily.   aspirin  81 MG chewable tablet Chew 81 mg by mouth daily.   B-D SINGLE USE SWABS REGULAR Pads   cilostazol  50 MG tablet Commonly known as: PLETAL  Take 1 tablet (50 mg total) by mouth 2 (two) times daily.   Eliquis 5 MG Tabs tablet Generic drug: apixaban Take 1 tablet (5 mg total) by mouth 2 (two) times daily.   ezetimibe  10 MG tablet Commonly known as: ZETIA  Take 1 tablet (10 mg total) by mouth every evening.   gabapentin  100 MG capsule Commonly known as: NEURONTIN  Take 1 capsule (100 mg total) by mouth 3 (three) times daily.   ibuprofen  200 MG tablet Commonly known as: ADVIL  Take 400 mg by mouth daily as needed for moderate pain.   insulin  aspart 100 UNIT/ML FlexPen Commonly known as: NOVOLOG  Inject 10 Units into the skin 3 (three) times daily as needed for high blood sugar. Takes Novolog  10 units with meals (also takes a few extra units if glucose is elevated)   insulin  glargine 100 UNIT/ML injection Commonly known as: LANTUS  Inject 19 Units into the skin daily.   Insulin  Syringe-Needle U-100 31G X 5/16" 0.5 ML  Misc Use one syringe twice daily DX:  250.70   Klor-Con  M20 20 MEQ tablet Generic drug: potassium chloride  SA Take 20 mEq by mouth daily.   losartan -hydrochlorothiazide  50-12.5 MG tablet Commonly known as: HYZAAR Take 1 tablet by mouth every morning.   omeprazole 20 MG capsule Commonly known as: PRILOSEC Take 20 mg by mouth daily as needed (acid reflux).   oxyCODONE  5 MG immediate release tablet Commonly known as: Oxy IR/ROXICODONE  Take 1 tablet (5 mg total) by mouth every 6 (six) hours as needed for severe pain (pain score 7-10) or breakthrough pain.   rosuvastatin  20 MG tablet Commonly known as: CRESTOR  TAKE 1 TABLET EVERY DAY (DISCONTINUE PRAVASTATIN )   True Metrix Air Glucose Meter w/Device Kit   True Metrix Blood Glucose Test test strip Generic drug: glucose blood   TRUEplus Lancets 33G Misc  Durable Medical Equipment  (From admission, onward)           Start     Ordered   11/24/23 1306  For home use only DME Bedside commode  Once       Question Answer Comment  Patient needs a bedside commode to treat with the following condition Ischemia of left lower extremity   Patient needs a bedside commode to treat with the following condition Normocytic anemia      11/24/23 1307   11/24/23 1303  For home use only DME Walker rolling  Once       Question Answer Comment  Walker: With 5 Inch Wheels   Patient needs a walker to treat with the following condition Ischemia of left lower extremity   Patient needs a walker to treat with the following condition Normocytic anemia   Patient needs a walker to treat with the following condition Hypertension      11/24/23 1307   11/24/23 0645  For home use only DME Walker rolling  Once       Question Answer Comment  Walker: With 5 Inch Wheels   Patient needs a walker to treat with the following condition S/P femoral-popliteal bypass surgery      11/24/23 0645              Discharge Care Instructions   (From admission, onward)           Start     Ordered   11/28/23 0000  Discharge wound care:       Comments: 1) Keep right leg from toes to knee wrapped with ACE compression bandage every morning and ok to take off at night.  Elevate legs when not up and about.   2) Dry gauze to right groin to wick moisture and help prevent wound infection.   11/28/23 1030            Follow-up Information     [facility] Follow up.   Why: Lennart Quitter will contact you within 48 hours of discharge to arrange a home health visit Contact information: [address] [place] [address] [phone]         [facility] Follow up.   Why: [facility] will be providing the bedside commode and the rolling walker to you before you dicharge Contact information: [address] [place] [address] [phone]         [facility] Follow up in 3 week(s).   Specialty: Vascular Surgery Why: Office will call you to arrange your appt (sent). Contact information: [address] [place]  [phone]               Allergies  Allergen Reactions   Lovastatin Itching and Rash   Nitrofurantoin Rash    Consultations: Vascular surgery  Procedures: 4/22: Right common femoral to peroneal artery bypass with PTFE and vein cuff   Discharge Exam: BP (!) 135/59 (BP Location: Left Arm)   Pulse 74   Temp 97.8 F (36.6 C) (Oral)   Resp 17   Ht (P) 5\' 7"  (1.702 m)   Wt (P) 83.9 kg   SpO2 100%   BMI (P) 28.98 kg/m  Physical Exam Constitutional:      Appearance: Normal appearance.  HENT:     Head: Normocephalic and atraumatic.     Mouth/Throat:  Mouth: Mucous membranes are moist.  Eyes:     Extraocular Movements: Extraocular movements intact.  Cardiovascular:     Rate and Rhythm: Normal rate and regular rhythm.  Pulmonary:      Effort: Pulmonary effort is normal. No respiratory distress.     Breath sounds: Normal breath sounds. No wheezing.  Abdominal:     General: Bowel sounds are normal. There is no distension.     Palpations: Abdomen is soft.     Tenderness: There is no abdominal tenderness.  Musculoskeletal:     Cervical back: Normal range of motion and neck supple.     Right lower leg: Edema (noted from mid thigh to foot; does have tenderness located around leg; sugical incision clean with no surrounding signs of infection; no bleeding noted) present.  Skin:    General: Skin is warm and dry.  Neurological:     General: No focal deficit present.     Mental Status: She is alert.  Psychiatric:        Mood and Affect: Mood normal.      The results of significant diagnostics from this hospitalization (including imaging, microbiology, ancillary and laboratory) are listed below for reference.   Microbiology: No results found for this or any previous visit (from the past 240 hours).   Labs: BNP (last 3 results) No results for input(s): "BNP" in the last 8760 hours. Basic Metabolic Panel: Recent Labs  Lab 11/22/23 0512 11/23/23 0419 11/24/23 0708  NA 136 138 135  K 4.2 4.1 3.9  CL 102 106 103  CO2 23 22 22   GLUCOSE 138* 154* 143*  BUN 15 16 20   CREATININE 1.02* 1.13* 1.14*  CALCIUM  8.7* 8.4* 8.2*  MG 1.7  --   --    Liver Function Tests: No results for input(s): "AST", "ALT", "ALKPHOS", "BILITOT", "PROT", "ALBUMIN " in the last 168 hours. No results for input(s): "LIPASE", "AMYLASE" in the last 168 hours. No results for input(s): "AMMONIA" in the last 168 hours. CBC: Recent Labs  Lab 11/24/23 0708 11/24/23 2034 11/25/23 0436 11/25/23 1847 11/26/23 1004 11/27/23 0818 11/28/23 0349  WBC 13.1*  --  14.0*  --  15.8* 12.3* 9.9  NEUTROABS  --   --   --   --  11.8* 7.6 5.9  HGB 6.5*   < > 7.4* 9.0* 9.2* 7.7* 7.9*  HCT 19.9*   < > 22.7* 27.9* 28.6* 24.2* 24.4*  MCV 85.4  --  84.1  --  86.9  87.1 87.5  PLT 131*  --  137*  --  189 204 232   < > = values in this interval not displayed.   Cardiac Enzymes: No results for input(s): "CKTOTAL", "CKMB", "CKMBINDEX", "TROPONINI" in the last 168 hours. BNP: Invalid input(s): "POCBNP" CBG: Recent Labs  Lab 11/27/23 0753 11/27/23 1206 11/27/23 1518 11/27/23 2228 11/28/23 0622  GLUCAP 106* 189* 208* 96 103*   D-Dimer No results for input(s): "DDIMER" in the last 72 hours. Hgb A1c No results for input(s): "HGBA1C" in the last 72 hours. Lipid Profile No results for input(s): "CHOL", "HDL", "LDLCALC", "TRIG", "CHOLHDL", "LDLDIRECT" in the last 72 hours. Thyroid function studies No results for input(s): "TSH", "T4TOTAL", "T3FREE", "THYROIDAB" in the last 72 hours.  Invalid input(s): "FREET3" Anemia work up No results for input(s): "VITAMINB12", "FOLATE", "FERRITIN", "TIBC", "IRON", "RETICCTPCT" in the last 72 hours. Urinalysis    Component Value Date/Time   COLORURINE AMBER (A) 11/27/2023 1446   APPEARANCEUR CLEAR 11/27/2023 1446   LABSPEC  1.017 11/27/2023 1446   PHURINE 5.0 11/27/2023 1446   GLUCOSEU NEGATIVE 11/27/2023 1446   HGBUR NEGATIVE 11/27/2023 1446   BILIRUBINUR NEGATIVE 11/27/2023 1446   KETONESUR NEGATIVE 11/27/2023 1446   PROTEINUR NEGATIVE 11/27/2023 1446   UROBILINOGEN 0.2 11/07/2013 1112   NITRITE NEGATIVE 11/27/2023 1446   LEUKOCYTESUR TRACE (A) 11/27/2023 1446   Sepsis Labs Recent Labs  Lab 11/25/23 0436 11/26/23 1004 11/27/23 0818 11/28/23 0349  WBC 14.0* 15.8* 12.3* 9.9   Microbiology No results found for this or any previous visit (from the past 240 hours).  Procedures/Studies: DG Chest 2 View Result Date: 11/26/2023 CLINICAL DATA:  [id number] Fever [id number] EXAM: CHEST - 2 VIEW COMPARISON:  02/25/2019 FINDINGS: Lungs are clear. Mild elevation of the left diaphragmatic leaflet as before. Heart size and mediastinal contours are within normal limits. Aortic Atherosclerosis (ICD10-170.0). No  effusion. Visualized bones unremarkable. IMPRESSION: No acute cardiopulmonary disease. Electronically Signed   By: Nicoletta Barrier M.D.   On: 11/26/2023 10:19   VAS US  LOWER EXTREMITY SAPHENOUS VEIN MAPPING Result Date: 11/20/2023 LOWER EXTREMITY VEIN MAPPING Patient Name:  DASHLEY ROGILLIO  Date of Exam:   11/18/2023 Medical Rec #: [id number]        Accession #:    [id number] Date of Birth: [birth year]        Patient Gender: F Patient Age:   78 years Exam Location:  [facility] Procedure:      VAS US  LOWER EXTREMITY SAPHENOUS VEIN MAPPING Referring Phys: Cordie Deters --------------------------------------------------------------------------------  Indications:  Ischemia Risk Factors: Hypertension, hyperlipidemia, Diabetes, PAD.  Comparison Study: None. Performing Technologist: Estanislao Heimlich  Examination Guidelines: A complete evaluation includes B-mode imaging, spectral Doppler, color Doppler, and power Doppler as needed of all accessible portions of each vessel. Bilateral testing is considered an integral part of a complete examination. Limited examinations for reoccurring indications may be performed as noted. +---------------+-----------+----------------------+---------------+-----------+   RT Diameter  RT Findings         GSV            LT Diameter  LT Findings      (cm)                                            (cm)                  +---------------+-----------+----------------------+---------------+-----------+      0.61                     Saphenofemoral                                                                Junction                                  +---------------+-----------+----------------------+---------------+-----------+      0.27                     Proximal thigh                               +---------------+-----------+----------------------+---------------+-----------+  0.26       branching       Mid thigh                                   +---------------+-----------+----------------------+---------------+-----------+      0.19                      Distal thigh                                +---------------+-----------+----------------------+---------------+-----------+      0.17                          Knee                                    +---------------+-----------+----------------------+---------------+-----------+      0.19       branching       Prox calf                                  +---------------+-----------+----------------------+---------------+-----------+      0.17       branching        Mid calf                                  +---------------+-----------+----------------------+---------------+-----------+      0.23                      Distal calf                                 +---------------+-----------+----------------------+---------------+-----------+      0.20                         Ankle                                    +---------------+-----------+----------------------+---------------+-----------+ Diagnosing physician: Runell Countryman Electronically signed by Runell Countryman on 11/20/2023 at 3:38:49 PM.    Final    PERIPHERAL VASCULAR CATHETERIZATION Result Date: 11/17/2023 Images from the original result were not included.   Patient name: Catherine Ramsey        MRN: [id number]        DOB: 23-Apr-1946          Sex: female  11/17/2023 Pre-operative Diagnosis: Acute on chronic limb ischemia right lower extremity with rest pain Post-operative diagnosis:  Same Surgeon:  Young Hensen, MD Procedure Performed: 1.  Thrombolytics catheter check right lower extremity including right lower extremity angiogram 2.  Unsuccessful antegrade recanalization of right TP trunk/proximal peroneal occlusion including catheter selection of the TP trunk 3.  Mynx closure of the left common femoral artery  Indications: 77 year old female seen with acute on chronic limb ischemia of the right lower  extremity.  She underwent initiation of lysis yesterday.  She presents for thrombolytics catheter check.  Findings:  Initial angiogram showed widely patent common femoral,  profunda, SFA in the right lower extremity.  The above-knee popliteal occlusion was now patent with a residual high-grade stenosis in the above-knee popliteal artery.  Continues to have occluded trifurcation with TP trunk occlusion and occlusion of the proximal peroneal and only distal reconstitution of a peroneal through collaterals.  I got a wire and catheter into the TP trunk but could not cross the occluded segment antegrade as this was all chronic and the wire wanted to track out collaterals.             Procedure:  The patient was identified in the holding area and taken to room 8.  The patient was then placed supine on the table and prepped and draped in the usual sterile fashion.  A time out was called.  Ultimately the EKOS catheter was removed from the right lower extremity after we put a V18 wire into the popliteal artery on the right through the EKOS catheter.  I did give 4000 units of additional IV heparin .  We checked an ACT.  Right lower extremity angiogram was then obtained with hand-injection with pertinent findings noted above.  Continues to have a severe tibial disease with an occluded TP trunk and proximal peroneal artery with only peroneal runoff.  Ultimately I got a V18 wire with a quick cross catheter into the TP trunk.  I tried to come antegrade and cross the occluded segment.  This was very chronic appearing and calcified.  The wire wanted to track out collaterals.  She still has her peroneal target for bypass.  I did not want to risk losing this.  Wires and catheters were removed.  A mynx closure was placed in the left groin after a short 6 French sheath was placed.  Plan: Will resume heparin  in 8 hours post sheath removal.  If no improvement in symptoms needs evaluation for right leg peroneal bypass.  Young Hensen, MD [facility] [place] Office: [phone]    PERIPHERAL VASCULAR CATHETERIZATION Result Date: 11/16/2023 Images from the original result were not included.   Patient name: Catherine Ramsey        MRN: [id number]        DOB: August 29, 1945          Sex: female  11/16/2023 Pre-operative Diagnosis: Acute on chronic [place] 2A right lower extremity ischemia Post-operative diagnosis:  Same Surgeon:  Kayla Part, MD Procedure Performed: 1.  Ultrasound-guided micropuncture access of the left common femoral artery in retrograde fashion 2.  Aortogram 3.  Second-order cannulation, left lower extremity angiogram 4.  Initiation of SFA, popliteal artery pharmacomechanical thrombolysis 5.  Contrast volume 45, moderate sedation time 35 minutes  Indications: Patient is a 78 year old female with known peripheral arterial disease with prior left sided intervention with radiology several years ago.  She presents with acute on chronic to a limb ischemia involving the right lower extremity.  This is improved with the use of heparin .  Imaging demonstrates acute thrombus in the popliteal artery.  After discussing the risks and benefits of diagnostic angiography with possible intervention, possible lytic catheter placement, and it is elected to proceed.  Findings: Aortogram: Bilateral renal arteries patent.  No flow-limiting stenosis in the aortoiliac segments bilaterally.   On the right: Widely patent common femoral artery, profunda, superficial femoral artery.  The P2 segment of the popliteal artery is occluded with reconstitution distally in the P3 segment of the below-knee popliteal artery.  The anterior tibial artery is patent initially, ending in  collaterals.  There is significant disease.  The tibioperoneal trunk is occluded.  The peroneal artery reconstitutes roughly 10 cm from the occlusion providing outflow to the foot via medial and lateral perforators.  Flow in the foot could not be assessed  due to the poor flow.             Procedure:  The patient was identified in the holding area and taken to room 8.  The patient was then placed supine on the table and prepped and draped in the usual sterile fashion.  A time out was called.  Ultrasound was used to evaluate the left common femoral artery.  It was patent .  A digital ultrasound image was acquired.  A micropuncture needle was used to access the left common femoral artery under ultrasound guidance.  An 018 wire was advanced without resistance and a micropuncture sheath was placed.  The 018 wire was removed and a benson wire was placed.  The micropuncture sheath was exchanged for a 5 french sheath.  An omniflush catheter was advanced over the wire to the level of L-1.  An abdominal angiogram was obtained.  Next, using the omniflush catheter and a benson wire, the aortic bifurcation was crossed and the catheter was placed into theright external iliac artery and right runoff was obtained.   I elected to attempt intervention.  The patient was heparinized and a 6 x 45 cm sheath was placed in the proximal superficial femoral artery.  Next, a series of wires and catheters were used to cross the popliteal artery lesion.  The wire sailed.  Clot was acute.  Difficulty came in the below-knee popliteal artery.  I was able to cannulate the anterior tibial artery, however this ended in collaterals at the mid tibia.  I brought the wire proximal and tried to recannulate the peroneal artery, however was gentle, as I did not want to become extraluminal and lose the possibility of lysis due to perforation.  I elected to leave an EKOS pharmacomechanical lysis catheter extending from the below-knee popliteal artery into the superficial femoral artery.  A 30 cm EKOS catheter was brought onto the field and inserted in standard fashion, followed by the ultrasound component. tPA will run at 1 mg/h, heparin  will be titrated according to the order set.  Plan will be return to OR  tomorrow.  n.p.o. midnight.    Kayla Part MD [facility] [place] Office: [phone]    CT Angio Aortobifemoral W and/or Wo Contrast Result Date: 11/15/2023 CLINICAL DATA:  Claudication or leg ischemia RLE critical ischemia by ABI, cold right foot EXAM: CT ANGIOGRAPHY AOBIFEM WITHOUT AND WITH CONTRAST TECHNIQUE: Multidetector CT imaging of the abdomen, pelvis and lower extremities was performed using the standard protocol during bolus administration of intravenous contrast. Multiplanar CT image reconstructions and MIPs were obtained to evaluate the vascular anatomy. CONTRAST:  85mL OMNIPAQUE  IOHEXOL  350 MG/ML SOLN COMPARISON:  October 05, 2021 FINDINGS: VASCULAR Aorta: Calcified and noncalcified atherosclerosis diffusely throughout the abdominal aorta without hemodynamically significant stenosis. No aneurysm or aortic dissection. No acute thrombus. Celiac: Patent without aneurysm or dissection. Moderate stenosis of the celiac ostium from calcified plaque. SMA: No aneurysm. Mild stenosis at the ostium from calcified plaque. Patent without aneurysm, dissection, or hemodynamically significant stenosis. Renals: Patent without or dissection. Moderate stenosis of the right renal ostium from calcified plaque. Severe stenosis of the proximal left renal artery from noncalcified plaque. IMA: Patent with no aneurysm or dissection. Mild to moderate  stenosis from mixed density plaque at the ostium. RIGHT Lower Extremity Inflow: Common, internal and external iliac arteries are patent without aneurysm, acute thrombus, or dissection. Diffuse atherosclerosis within both common iliac arteries without hemodynamically significant stenosis. Outflow: Common, superficial and profunda femoral, are patent without acute thrombus, aneurysm, or dissection. Acute thrombus within the behind knee popliteal artery with distal reconstitution. Mild multifocal stenoses within the superficial femoral artery from calcified  plaque. There is a moderate to severe stenosis of the above knee popliteal artery from mixed density plaque. Runoff: Peripheral vascular atherosclerosis throughout the trifurcation vessels with single-vessel runoff via the diminutive peroneal artery. LEFT Lower Extremity Inflow: Common, internal and external iliac arteries are patent without aneurysm, acute thrombus, or dissection. No plaque or hemodynamically significant stenosis. Outflow: Common, superficial and profunda femoral, and the popliteal arteries are patent without acute thrombus, aneurysm, or dissection. Moderate to severe multifocal stenoses within the popliteal artery from mixed density plaque. Runoff: Peripheral vascular atherosclerosis throughout the trifurcation vessels with a single-vessel runoff via the anterior tibial artery. Veins: Not evaluated due to the phase of contrast. Review of the MIP images confirms the above findings. NON-VASCULAR Lower chest: No focal airspace consolidation or pleural effusion. Hepatobiliary: Small hemangioma versus vascular shunting in the subcapsular right hepatic lobe.No radiopaque stones or wall thickening of the gallbladder.No intrahepatic or extrahepatic biliary ductal dilation. Pancreas: No mass or main ductal dilation.No peripancreatic inflammation or fluid collection. Spleen: Normal size. No mass. Adrenals/Urinary Tract: No adrenal masses. No renal mass. No hydronephrosis or nephrolithiasis. The urinary bladder is distended without focal abnormality. Stomach/Bowel: Mild circumferential wall thickening of the distal esophagus. Small hiatal hernia. No small bowel wall thickening or inflammation. No small bowel obstruction. Normal appendix. Vascular/Lymphatic: No intraabdominal or pelvic lymphadenopathy. Reproductive: Hysterectomy. No concerning adnexal mass.No free pelvic fluid. Other: No pneumoperitoneum, ascites, or mesenteric inflammation. Musculoskeletal: No acute fracture or destructive lesion.Multilevel  degenerative disc disease of the spine. Right hip arthroplasty appears anatomically aligned without dislocation. Moderate joint space loss of the left hip. Review of the MIP images confirms the above findings. IMPRESSION: VASCULAR 1. No abdominal aortic aneurysm or aortic dissection. 2. Moderate stenosis of the right renal ostium from calcified plaque. Severe stenosis of the proximal left renal artery from noncalcified plaque Aortic Atherosclerosis (ICD10-I70.0). Right leg: 1. Acute, occlusive thrombus within the right behind knee popliteal artery. 2. Moderate to severe stenosis within the above knee popliteal artery from mixed density plaque. 3. Single-vessel runoff to the right foot via the diminutive peroneal artery. Extensive peripheral vascular atherosclerosis throughout the trifurcation vessels. Left leg: 1. Moderate to severe multifocal stenoses within the popliteal artery from mixed density plaque. 2. peripheral vascular atherosclerosis throughout the trifurcation vessels with a single-vessel runoff via the anterior tibial artery. NON-VASCULAR 1. Mild circumferential wall thickening of the distal esophagus, with small hiatal hernia, likely reflecting gastroesophageal reflux disease. Critical Value/emergent results were called by telephone at the time of interpretation on 11/15/2023 at 8:42 pm to provider Annita Kindle MD , who verbally acknowledged these results. Electronically Signed   By: Rance Burrows M.D.   On: 11/15/2023 20:49   VAS US  ABI WITH/WO TBI Result Date: 11/15/2023  LOWER EXTREMITY DOPPLER STUDY Patient Name:  Catherine Ramsey  Date of Exam:   11/15/2023 Medical Rec #: [id number]        Accession #:    [id number] Date of Birth: 12-15-1945        Patient Gender: F Patient Age:   73 years Exam Location:  [facility] Procedure:      VAS US  ABI WITH/WO TBI Referring Phys: STEPHANIE EDWARDS --------------------------------------------------------------------------------  Indications: Rest pain, and  peripheral artery disease. Patient presents with              pain in the right leg all the way from her toes up to the thigh.              She reports the right leg has been feeling heavy, cold and numb.              The pain is present all the time and all through the night and she              has been unable to sleep. Hanging the leg down helps alleviate the              symptoms slightly. She has a history of left anterior tibial              atherectomy and angioplasty done 10/15/21. Angiogram 10/07/21 showed              right TP trunk occlusion, occluded posterior tibial artery and              segmental occlusions of the anterior tibial artery. Left peroneal              artery occlusion, left posterior tibial artery occlusion and              diffuse disease of the anterior tibial artery. High Risk Factors: Hypertension, hyperlipidemia, Diabetes, no history of                    smoking.  Vascular Interventions: Left anterior tibial laser atherectomy followed by                         balloon angioplasty 10/15/21. Comparison Study: Previous ABI 10/27/22 performed at [facility]                   showed right ABI of 0.96 with monophasic waveforms and left                   ABI of 0.97 with monophasic waveforms. ABI at [facility] 09/24/21                   showed right ABI of 0.62 on the right and 0.70 on the left                   with bilateral TBI's of 0. Performing Technologist: Harless Lien RVT Supporting Technologist: Richarda Chance RVT, RDCS (AE), RDMS  Examination Guidelines: A complete evaluation includes at minimum, Doppler waveform signals and systolic blood pressure reading at the level of bilateral brachial, anterior tibial, and posterior tibial arteries, when vessel segments are accessible. Bilateral testing is considered an integral part of a complete examination. Photoelectric Plethysmograph (PPG) waveforms and toe systolic pressure readings are included as required and additional duplex  testing as needed. Limited examinations for reoccurring indications may be performed as noted.  ABI Findings: +---------+------------------+-----+-------------------+--------------+ Right    Rt Pressure (mmHg)IndexWaveform           Comment        +---------+------------------+-----+-------------------+--------------+ Brachial 129                                                      +---------+------------------+-----+-------------------+--------------+  PTA                             absent                            +---------+------------------+-----+-------------------+--------------+ PERO                            absent                            +---------+------------------+-----+-------------------+--------------+ DP       9                 0.07 dampened monophasicbarely audible +---------+------------------+-----+-------------------+--------------+ Great Toe0                 0.00 Absent                            +---------+------------------+-----+-------------------+--------------+ +---------+------------------+-----+----------+-------+ Left     Lt Pressure (mmHg)IndexWaveform  Comment +---------+------------------+-----+----------+-------+ Brachial 113                                      +---------+------------------+-----+----------+-------+ PTA      90                0.70 monophasic        +---------+------------------+-----+----------+-------+ DP       90                0.70 monophasic        +---------+------------------+-----+----------+-------+ Great Toe31                0.24 Abnormal          +---------+------------------+-----+----------+-------+ +-------+-----------+-----------+------------+------------+ ABI/TBIToday's ABIToday's TBIPrevious ABIPrevious TBI +-------+-----------+-----------+------------+------------+ Right  0.07       0          0.62        0             +-------+-----------+-----------+------------+------------+ Left   0.70       0          0.70        0            +-------+-----------+-----------+------------+------------+ Right ABIs appear essentially unchanged compared to prior study on 09/24/21. Left ABIs appear essentially unchanged compared to prior study on 09/24/21.  Findings reported to Dr. Rosellen Conners nurse, Davee Erm, via voicemail at 12:15 pm. EPIC documentation of preliminary results made.  Summary: Right: Resting right ankle-brachial index indicates critical limb ischemia. The right toe-brachial index is abnormal. Left: Resting left ankle-brachial index indicates moderate left lower extremity arterial disease. The left toe-brachial index is abnormal. *See table(s) above for measurements and observations.  Suggest Peripheral Vascular Consult. Electronically signed by Federico Hopkins on 11/15/2023 at 7:55:49 PM.    Final      Time coordinating discharge: Over 30 minutes    Faith Homes, MD  [facility] 11/28/2023, 12:28 PM

## 2023-11-28 NOTE — TOC Transition Note (Signed)
 Transition of Care [facility]) - Discharge Note Sherin Dingwall RN, BSN Transitions of Care Unit 4E- RN Case Manager See Treatment Team for direct phone #    Patient Details  Name: Catherine Ramsey MRN: [id number] Date of Birth: [birth year]  Transition of Care [facility]) CM/SW Contact:  Rox Cope, RN Phone Number: 11/28/2023, 11:08 AM   Clinical Narrative:    Pt stable for transition home today, Per previous CM note HH PT/OT has been set up with [facility]. Liaison notified for start fo care.   DME arranged with [employer]- confirmed RW and BSC has been delivered to pt at bedside.   Family to transport home.   No further TOC needs noted.    Final next level of care: Home w Home Health Services Barriers to Discharge: Barriers Resolved   Patient Goals and CMS Choice Patient states their goals for this hospitalization and ongoing recovery are:: to go home          Discharge Placement                 Home w/ [facility]      Discharge Plan and Services Additional resources added to the After Visit Summary for     Discharge Planning Services: CM Consult Post Acute Care Choice: Durable Medical Equipment, Home Health          DME Arranged: Otho Blitz, Bedside commode DME Agency: [facility]       HH Arranged: PT, OT [facility] Agency: [facility] Date [facility] Agency Contacted: 11/28/23 Time HH Agency Contacted: 1107 Representative spoke with at [facility] Agency: Amy  Social Drivers of Health (SDOH) Interventions SDOH Screenings   Food Insecurity: No Food Insecurity (11/15/2023)  Housing: Low Risk  (11/15/2023)  Transportation Needs: No Transportation Needs (11/15/2023)  Utilities: Not At Risk (11/15/2023)  Social Connections: Socially Integrated (11/15/2023)  Tobacco Use: Low Risk  (11/15/2023)     Readmission Risk Interventions    11/28/2023   11:08 AM  Readmission Risk Prevention Plan  Post Dischage Appt Complete  Medication Screening Complete  Transportation Screening Complete

## 2023-11-28 NOTE — Progress Notes (Addendum)
 Daughter Sharlynn picked up items 11/27/23.  Small clear plastic bag found in room after discharge. Inside black rubber wrist band and ring. Called husband Imo Alkhatib [phone]. He is unable to leave wife at this time but will pick up tomorrow. Bag labeled with patient name at nurses station with NS. Benna Brasher, RN

## 2023-11-28 NOTE — Progress Notes (Signed)
 Occupational Therapy Treatment Patient Details Name: Catherine Ramsey MRN: [id number] DOB: [birth year] Today's Date: 11/28/2023   History of present illness 78 y.o. female presents to [facility] 11/15/23 for R LE pain and numbness. Pt admitted with R LE critical ischemia and moderate LE arterial disease. 4/16 R LE angiogram w/ thrombolysis. 4/17 repeat angiogram w/ unsuccessful recanalization of R TP trunk. 4/22 R fem bypass with PTFE and vein cuff. PMHx: PVD s/p LLE ATA laser atherectomy and balloon angioplasty (10/15/2021), T2DM, CKD stage IIIb, HTN, HLD, R THA   OT comments  Pt received seated on toilet and agreeable to session. Pt able to manage clothing and hygiene and propelled self up utilizing grab bars, ambulated to bedside to sit while RN present and provided medication. Following pt ambulated to sink, completing UB bathing, grooming, self care, and dressing while seated. Supervision required throughout and min A to manage LB ADLs. Pt able to stand from chair and return to EOB in preparation for anticipated D/C. Pt left with all needs met. Acute OT to continue to follow to address established goals to facilitate DC to next venue of care.        If plan is discharge home, recommend the following:  A little help with walking and/or transfers;A little help with bathing/dressing/bathroom;Assistance with cooking/housework;Assist for transportation;Help with stairs or ramp for entrance   Equipment Recommendations  BSC/3in1    Recommendations for Other Services      Precautions / Restrictions Precautions Precautions: Fall Recall of Precautions/Restrictions: Intact Restrictions Weight Bearing Restrictions Per Provider Order: No       Mobility Bed Mobility Overal bed mobility: Needs Assistance Bed Mobility: Supine to Sit     Supine to sit: Supervision     General bed mobility comments: supervision for safety    Transfers Overall transfer level: Needs assistance Equipment used:  Rolling walker (2 wheels) Transfers: Sit to/from Stand Sit to Stand: Contact guard assist           General transfer comment: CGA for safety     Balance Overall balance assessment: Needs assistance Sitting-balance support: No upper extremity supported, Feet supported Sitting balance-Leahy Scale: Fair     Standing balance support: Bilateral upper extremity supported, During functional activity, Reliant on assistive device for balance Standing balance-Leahy Scale: Poor Standing balance comment: reliant on external support                           ADL either performed or assessed with clinical judgement   ADL Overall ADL's : Needs assistance/impaired     Grooming: Wash/dry face;Applying deodorant;Oral care;Brushing hair;Supervision/safety;Sitting Grooming Details (indicate cue type and reason): seated at sink, supervision for safety Upper Body Bathing: Supervision/ safety;Sitting Upper Body Bathing Details (indicate cue type and reason): seated at sink \     Upper Body Dressing : Set up;Sitting Upper Body Dressing Details (indicate cue type and reason): donned tank top and t shirt Lower Body Dressing: Minimal assistance;Sitting/lateral leans Lower Body Dressing Details (indicate cue type and reason): assist to thread r leg in pants and donned socks Toilet Transfer: Contact guard assist;Rolling walker (2 wheels);Grab bars;Regular Teacher, adult education Details (indicate cue type and reason): seated on toilet upon arrival Toileting- Clothing Manipulation and Hygiene: Supervision/safety;Sit to/from stand Toileting - Clothing Manipulation Details (indicate cue type and reason): able to manage clothing and hygiene with no physical assist     Functional mobility during ADLs: Supervision/safety;Rolling walker (2 wheels) General ADL Comments: supervision  for mobility with RW, pt requiring no physical assist except with LB dressing d/t RLE pain and decreased movement     Extremity/Trunk Assessment              Vision       Perception     Praxis     Communication Communication Communication: No apparent difficulties   Cognition Arousal: Alert Behavior During Therapy: WFL for tasks assessed/performed                                 Following commands: Impaired Following commands impaired: Follows one step commands with increased time      Cueing   Cueing Techniques: Verbal cues  Exercises      Shoulder Instructions       General Comments VSS    Pertinent Vitals/ Pain       Pain Assessment Pain Assessment: 0-10 Pain Score: 6  Pain Location: R LE Pain Descriptors / Indicators: Discomfort, Sore Pain Intervention(s): Monitored during session, RN gave pain meds during session  Home Living                                          Prior Functioning/Environment              Frequency  Min 2X/week        Progress Toward Goals  OT Goals(current goals can now be found in the care plan section)  Progress towards OT goals: Progressing toward goals  Acute Rehab OT Goals Patient Stated Goal: return home OT Goal Formulation: With patient Time For Goal Achievement: 12/05/23 Potential to Achieve Goals: Good ADL Goals Pt Will Perform Grooming: with modified independence;standing Pt Will Perform Lower Body Dressing: with modified independence;sit to/from stand Pt Will Transfer to Toilet: ambulating;regular height toilet Pt Will Perform Tub/Shower Transfer: with supervision;ambulating  Plan      Co-evaluation                 AM-PAC OT "6 Clicks" Daily Activity     Outcome Measure   Help from another person eating meals?: A Little Help from another person taking care of personal grooming?: A Little Help from another person toileting, which includes using toliet, bedpan, or urinal?: A Little Help from another person bathing (including washing, rinsing, drying)?: A Lot Help from  another person to put on and taking off regular upper body clothing?: A Little Help from another person to put on and taking off regular lower body clothing?: A Lot 6 Click Score: 16    End of Session Equipment Utilized During Treatment: Rolling walker (2 wheels)  OT Visit Diagnosis: Unsteadiness on feet (R26.81);Muscle weakness (generalized) (M62.81);Pain Pain - Right/Left: Right Pain - part of body: Leg;Ankle and joints of foot   Activity Tolerance Patient tolerated treatment well   Patient Left in chair;with call bell/phone within reach   Nurse Communication Mobility status        Time: 0826-0909 OT Time Calculation (min): 43 min  Charges: OT General Charges $OT Visit: 1 Visit OT Treatments $Self Care/Home Management : 38-52 mins  Celene Pippins, BS, OTA/S   Berdina Cheever 11/28/2023, 12:54 PM

## 2023-11-28 NOTE — Progress Notes (Signed)
 Catherine Ramsey to be D/C'd Home per MD order.  Discussed with the patient and all questions fully answered.  VSS, Skin clean, dry and intact without evidence of skin break down, no evidence of skin tears noted. IV catheter discontinued intact. Site without signs and symptoms of complications. Dressing and pressure applied.  An After Visit Summary was printed and given to the patient. Patient received prescription.  D/c education completed with patient/family including follow up instructions, medication list, d/c activities limitations if indicated, with other d/c instructions as indicated by MD - patient able to verbalize understanding, all questions fully answered.   Patient instructed to return to ED, call 911, or call MD for any changes in condition.   Patient escorted via WC, and D/C home via private auto.  Barton Like Catherine Ramsey 11/28/2023 11:59 AM

## 2023-11-28 NOTE — Progress Notes (Cosign Needed Addendum)
  Progress Note    11/28/2023 6:32 AM 6 Days Post-Op  Subjective:  no complaints; wants to go home.   Afebrile HR 60's-80's NSR 110's-180's systolic 100% RA  Vitals:   11/28/23 0015 11/28/23 0405  BP: (!) 144/118 (!) 187/77  Pulse:  75  Resp:  13  Temp: 98 F (36.7 C) 98.8 F (37.1 C)  SpO2:      Physical Exam: General:  no distress Cardiac:  regular Lungs:  non labored Incisions:  right groin and right below knee incisions look good Extremities:  brisk right peroneal doppler signal.  RLE with expected swelling. Right foot is warm.  Abdomen:  soft  CBC    Component Value Date/Time   WBC 9.9 11/28/2023 0349   RBC 2.79 (L) 11/28/2023 0349   HGB 7.9 (L) 11/28/2023 0349   HCT 24.4 (L) 11/28/2023 0349   PLT 232 11/28/2023 0349   MCV 87.5 11/28/2023 0349   MCH 28.3 11/28/2023 0349   MCHC 32.4 11/28/2023 0349   RDW 13.7 11/28/2023 0349   LYMPHSABS 2.8 11/28/2023 0349   MONOABS 0.9 11/28/2023 0349   EOSABS 0.3 11/28/2023 0349   BASOSABS 0.0 11/28/2023 0349    BMET    Component Value Date/Time   NA 135 11/24/2023 0708   NA 143 03/26/2019 1403   K 3.9 11/24/2023 0708   CL 103 11/24/2023 0708   CO2 22 11/24/2023 0708   GLUCOSE 143 (H) 11/24/2023 0708   BUN 20 11/24/2023 0708   BUN 11 03/26/2019 1403   CREATININE 1.14 (H) 11/24/2023 0708   CALCIUM  8.2 (L) 11/24/2023 0708   GFRNONAA 50 (L) 11/24/2023 0708   GFRAA 56 (L) 03/26/2019 1403    INR    Component Value Date/Time   INR 1.2 11/15/2023 1546     Intake/Output Summary (Last 24 hours) at 11/28/2023 1610 Last data filed at 11/28/2023 0400 Gross per 24 hour  Intake --  Output 700 ml  Net -700 ml      Assessment/Plan:  78 y.o. female is s/p:  Right common femoral to peroneal artery bypass with PTFE and vein cuff for rest pain   6 Days Post-Op   -pt continues to have very brisk right peroneal doppler flow  -expected swelling-discussed with pt wrapping her right leg from toes to knee every  morning and ok to take off at night.  Elevate legs when not up and about.   -dry gauze to right groin to wick moisture and help prevent wound infection.   -acute blood loss anemia is stable with hgb 7.9 today and she is tolerating.  No indication for transfusion -leukocytosis resolved and has been afebrile x 24hrs.  U/a with rare bacteria and trace leukocytes but no nitrites.   -DVT prophylaxis:  Eliquis started yesterday -f/u with [facility] in 2-3 weeks for incision check.  Our office will arrange appt.    Catherine Smart, PA-C [facility] [phone] 11/28/2023 6:32 AM

## 2023-11-28 NOTE — Plan of Care (Signed)

## 2023-11-28 NOTE — Progress Notes (Signed)
 Physical Therapy Treatment Patient Details Name: Catherine Ramsey MRN: [id number] DOB: 02/11/1946 Today's Date: 11/28/2023   History of Present Illness 78 y.o. female presents to [facility] 11/15/23 for R LE pain and numbness. Pt admitted with R LE critical ischemia and moderate LE arterial disease. 4/16 R LE angiogram w/ thrombolysis. 4/17 repeat angiogram w/ unsuccessful recanalization of R TP trunk. 4/22 R fem bypass with PTFE and vein cuff. PMHx: PVD s/p LLE ATA laser atherectomy and balloon angioplasty (10/15/2021), T2DM, CKD stage IIIb, HTN, HLD, R THA    PT Comments  Pt dressed and sitting up in recliner on entry. Pt reporting her pain is 5/5 which has been her baseline. Pt is able to ambulate despite pain. Pt is contact guard assist for transfers and ambulation. Educated pt about pain management with frequent movement, to assist in decreased use of pain medication. Pt reporting decreased bowel movement. PT also educated on benefits of sitting upright and also frequent movement to assist. MD present with return to pt room to assess for discharge.    If plan is discharge home, recommend the following: A little help with walking and/or transfers;A little help with bathing/dressing/bathroom;Assistance with cooking/housework;Assist for transportation;Help with stairs or ramp for entrance   Can travel by private vehicle      Yes  Equipment Recommendations  Rolling walker (2 wheels)       Precautions / Restrictions Precautions Precautions: Fall Restrictions Weight Bearing Restrictions Per Provider Order: No     Mobility  Bed Mobility               General bed mobility comments: sitting up in recliner on entry    Transfers Overall transfer level: Needs assistance Equipment used: Rolling walker (2 wheels) Transfers: Sit to/from Stand, Bed to chair/wheelchair/BSC Sit to Stand: Contact guard assist           General transfer comment: good power up from recliner and self steadying,  CGA for safety    Ambulation/Gait Ambulation/Gait assistance: Contact guard assist Gait Distance (Feet): 50 Feet Assistive device: Rolling walker (2 wheels) Gait Pattern/deviations: Decreased stance time - right, Decreased weight shift to right, Shuffle, Step-to pattern, Decreased stride length, Antalgic Gait velocity: decr Gait velocity interpretation: <1.8 ft/sec, indicate of risk for recurrent falls   General Gait Details: vc for upright posture and looking up and out,         Balance Overall balance assessment: Needs assistance, Mild deficits observed, not formally tested Sitting-balance support: No upper extremity supported, Feet supported Sitting balance-Leahy Scale: Fair     Standing balance support: Bilateral upper extremity supported, During functional activity, Reliant on assistive device for balance Standing balance-Leahy Scale: Poor Standing balance comment: reliant on BUE support                            Communication Communication Communication: No apparent difficulties  Cognition Arousal: Alert Behavior During Therapy: WFL for tasks assessed/performed   PT - Cognitive impairments: Problem solving, Sequencing                       PT - Cognition Comments: better recall of hand placement for RW Following commands: Impaired Following commands impaired: Follows one step commands with increased time    Cueing Cueing Techniques: Verbal cues, Tactile cues     General Comments General comments (skin integrity, edema, etc.): pt looking forward to discharge home today, discussed need for movement every  hour, to help in pain control, as well as being upright most of the day to help with digestion      Pertinent Vitals/Pain Pain Assessment Pain Assessment: 0-10 Pain Score: 5  Pain Location: R LE Pain Descriptors / Indicators: Discomfort, Sore Pain Intervention(s): Limited activity within patient's tolerance     PT Goals (current goals  can now be found in the care plan section) Acute Rehab PT Goals Patient Stated Goal: to get better PT Goal Formulation: With patient Time For Goal Achievement: 12/04/23 Potential to Achieve Goals: Good Progress towards PT goals: Progressing toward goals    Frequency    Min 2X/week       AM-PAC PT "6 Clicks" Mobility   Outcome Measure  Help needed turning from your back to your side while in a flat bed without using bedrails?: A Little Help needed moving from lying on your back to sitting on the side of a flat bed without using bedrails?: A Little Help needed moving to and from a bed to a chair (including a wheelchair)?: A Little Help needed standing up from a chair using your arms (e.g., wheelchair or bedside chair)?: A Little Help needed to walk in hospital room?: A Little Help needed climbing 3-5 steps with a railing? : A Little 6 Click Score: 18    End of Session Equipment Utilized During Treatment: Gait belt Activity Tolerance: Patient tolerated treatment well Patient left: in chair;with call bell/phone within reach;with family/visitor present Nurse Communication: Mobility status PT Visit Diagnosis: Other abnormalities of gait and mobility (R26.89);Muscle weakness (generalized) (M62.81)     Time: 1610-9604 PT Time Calculation (min) (ACUTE ONLY): 11 min  Charges:    $Gait Training: 8-22 mins PT General Charges $$ ACUTE PT VISIT: 1 Visit                     Therron Sells B. Jewel Mortimer PT, DPT Acute Rehabilitation Services Please use secure chat or  Call Office [phone]    [name] [facility] 11/28/2023, 10:41 AM

## 2023-11-28 NOTE — Progress Notes (Signed)
 Mobility Specialist Progress Note:   11/28/23 1153  Mobility  Activity Ambulated with assistance to bathroom;Transferred from chair to bed (C>WC.)  Level of Assistance Standby assist, set-up cues, supervision of patient - no hands on  Assistive Device Front wheel walker  Distance Ambulated (ft) 12 ft  Activity Response Tolerated well  Mobility Referral Yes  Mobility visit 1 Mobility  Mobility Specialist Start Time (ACUTE ONLY) 1149  Mobility Specialist Stop Time (ACUTE ONLY) 1153  Mobility Specialist Time Calculation (min) (ACUTE ONLY) 4 min    Pt requested assistance to bathroom. SBA via RW for safety to stand and ambulate. Tolerated well, asx throughout. Void successful, assisted pt to [facility] for d/c. All needs met, left in care of volunteer.   Offie Waide Mobility Specialist Please contact via Special educational needs teacher or  Rehab office at [phone]

## 2023-11-28 NOTE — Care Management Important Message (Signed)
 Important Message  Patient Details  Name: Catherine Ramsey MRN: [id number] Date of Birth: 05/13/1946   Important Message Given:  Yes - Medicare IM     Janith Melnick 11/28/2023, 10:39 AM

## 2023-11-29 ENCOUNTER — Telehealth: Payer: Self-pay

## 2023-11-29 DIAGNOSIS — T8142XD Infection following a procedure, deep incisional surgical site, subsequent encounter: Secondary | ICD-10-CM | POA: Diagnosis not present

## 2023-11-29 DIAGNOSIS — I701 Atherosclerosis of renal artery: Secondary | ICD-10-CM | POA: Diagnosis not present

## 2023-11-29 DIAGNOSIS — E1151 Type 2 diabetes mellitus with diabetic peripheral angiopathy without gangrene: Secondary | ICD-10-CM | POA: Diagnosis not present

## 2023-11-29 DIAGNOSIS — E1122 Type 2 diabetes mellitus with diabetic chronic kidney disease: Secondary | ICD-10-CM | POA: Diagnosis not present

## 2023-11-29 DIAGNOSIS — Z794 Long term (current) use of insulin: Secondary | ICD-10-CM | POA: Diagnosis not present

## 2023-11-29 DIAGNOSIS — D631 Anemia in chronic kidney disease: Secondary | ICD-10-CM | POA: Diagnosis not present

## 2023-11-29 DIAGNOSIS — I700 Atherosclerosis of aorta: Secondary | ICD-10-CM | POA: Diagnosis not present

## 2023-11-29 DIAGNOSIS — N1832 Chronic kidney disease, stage 3b: Secondary | ICD-10-CM | POA: Diagnosis not present

## 2023-11-29 DIAGNOSIS — E1169 Type 2 diabetes mellitus with other specified complication: Secondary | ICD-10-CM | POA: Diagnosis not present

## 2023-11-29 DIAGNOSIS — Z48812 Encounter for surgical aftercare following surgery on the circulatory system: Secondary | ICD-10-CM | POA: Diagnosis not present

## 2023-11-29 DIAGNOSIS — I70221 Atherosclerosis of native arteries of extremities with rest pain, right leg: Secondary | ICD-10-CM | POA: Diagnosis not present

## 2023-11-29 DIAGNOSIS — E1165 Type 2 diabetes mellitus with hyperglycemia: Secondary | ICD-10-CM | POA: Diagnosis not present

## 2023-11-29 DIAGNOSIS — E1141 Type 2 diabetes mellitus with diabetic mononeuropathy: Secondary | ICD-10-CM | POA: Diagnosis not present

## 2023-11-29 NOTE — Telephone Encounter (Signed)
 Rene Carrier, PT @ [facility] Verbal Orders: 2x/week for 5 weeks 1x/week for 3 weeks

## 2023-11-29 NOTE — Telephone Encounter (Signed)
 Gretchen, PT @ [facility] called stating the patient has a new serous blister on her right second toe.  Patient was called and advised to leave the blister in tact if at all possible and not to pop it. Pt was advised that if blister does pop, to keep it clean.   Pt advised to call [facility] if the blister becomes warm, painful, odorous, has any discharge or if she starts running a temperature.  Pt agreed that she understood these instructions.

## 2023-12-01 LAB — POCT ACTIVATED CLOTTING TIME: Activated Clotting Time: 245 s

## 2023-12-01 LAB — POCT I-STAT 7, (LYTES, BLD GAS, ICA,H+H)
Acid-base deficit: 1 mmol/L (ref 0.0–2.0)
Bicarbonate: 22.3 mmol/L (ref 20.0–28.0)
Calcium, Ion: 1.17 mmol/L (ref 1.15–1.40)
HCT: 37 % (ref 36.0–46.0)
Hemoglobin: 12.6 g/dL (ref 12.0–15.0)
O2 Saturation: 100 %
Patient temperature: 36.8
Potassium: 3.7 mmol/L (ref 3.5–5.1)
Sodium: 137 mmol/L (ref 135–145)
TCO2: 23 mmol/L (ref 22–32)
pCO2 arterial: 33.4 mmHg (ref 32–48)
pH, Arterial: 7.431 (ref 7.35–7.45)
pO2, Arterial: 269 mmHg — ABNORMAL HIGH (ref 83–108)

## 2023-12-05 ENCOUNTER — Telehealth: Payer: Self-pay

## 2023-12-05 NOTE — Telephone Encounter (Signed)
 HH order given to Will, OT  OT evaluation and 1x week for 4 week

## 2023-12-15 IMAGING — CT CT ANGIO AOBIFEM WO/W CM
1 of 11 series · 11 of 46 positions shown, 15 images · IV contrast (APPLIED)
Comparison: CT abdomen and pelvis 11/07/2013

CLINICAL DATA: Critical limb ischemia of both lower extremities.
Slowly healing right foot wound.

EXAM:
CT ANGIOGRAPHY OF ABDOMINAL AORTA WITH ILIOFEMORAL RUNOFF
TECHNIQUE: Multidetector CT imaging of the abdomen, pelvis and lower
extremities was performed using the standard protocol during bolus
administration of intravenous contrast. Multiplanar CT image
reconstructions and MIPs were obtained to evaluate the vascular
anatomy.

[Series 5: axial arterial upper · axial · arterial · 0.83mm/px · z∈[-1211,-83]mm · 11 of 447 slices shown, 15 images]
[im 47/447  soft-tissue]
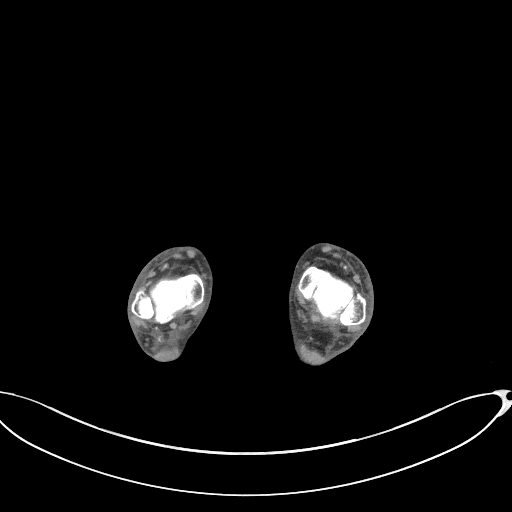
[im 47/447  bone]
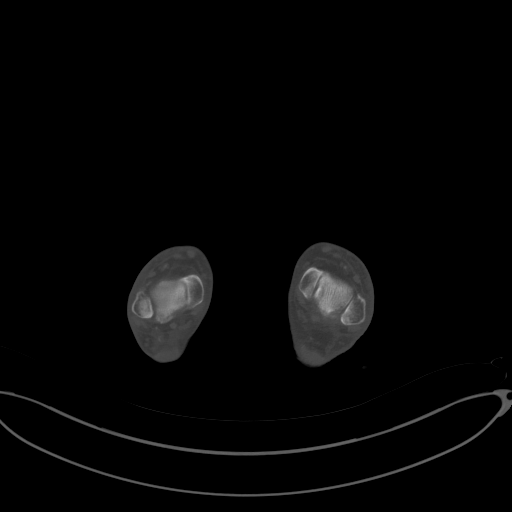
[im 94/447  soft-tissue]
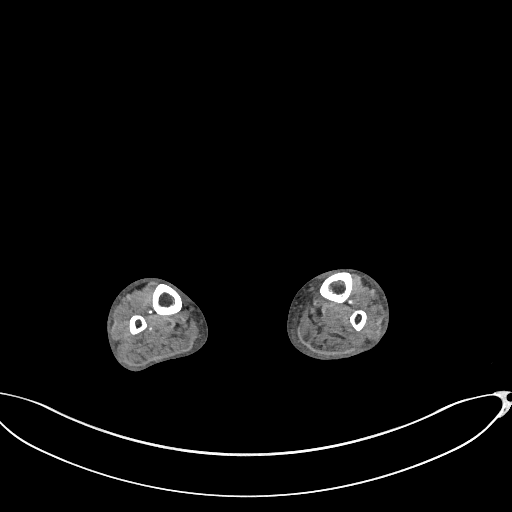
[im 141/447  soft-tissue]
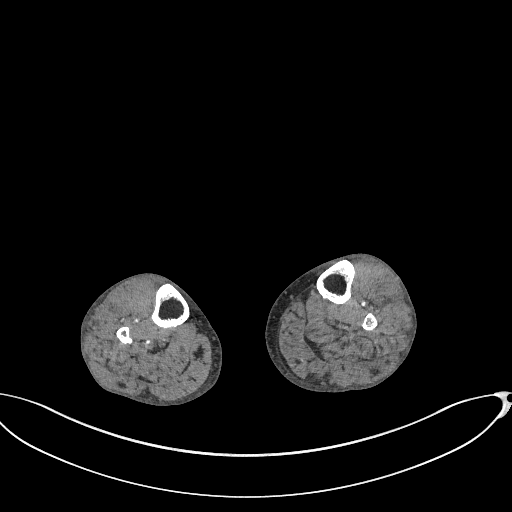
[im 188/447  soft-tissue]
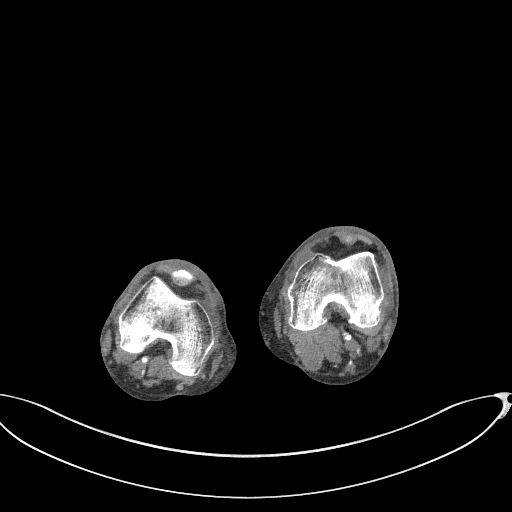
[im 235/447  soft-tissue]
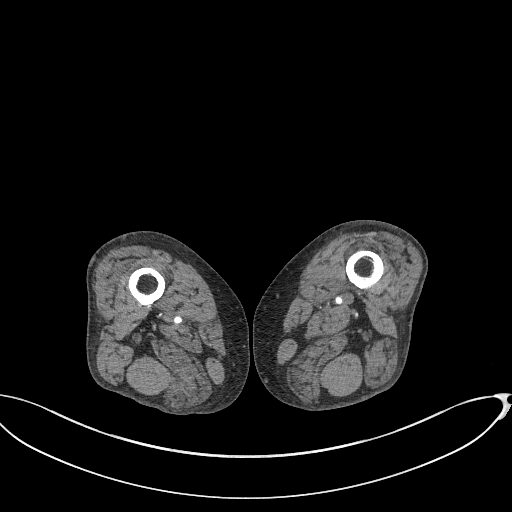
[im 282/447  soft-tissue]
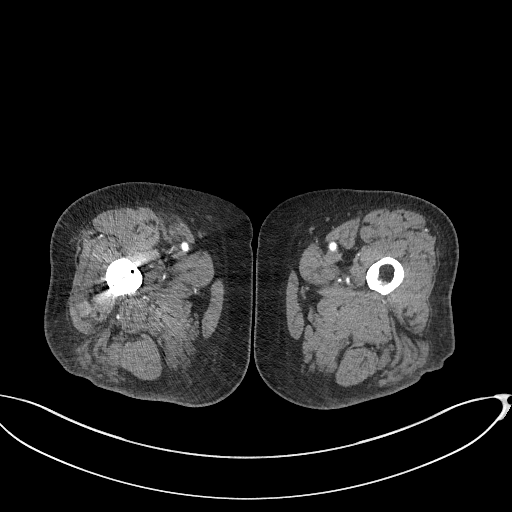
[im 329/447  soft-tissue]
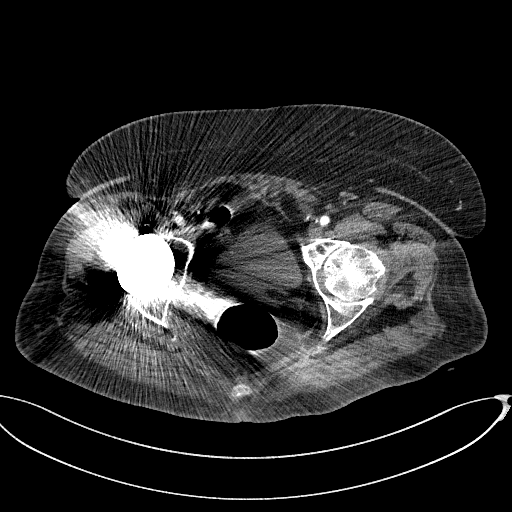
[im 353/447  lung]
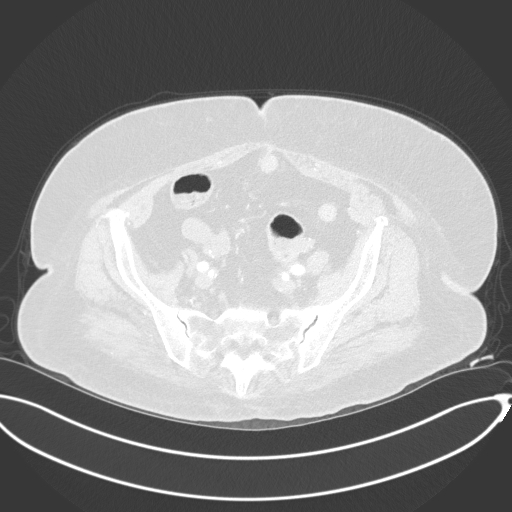
[im 376/447  soft-tissue]
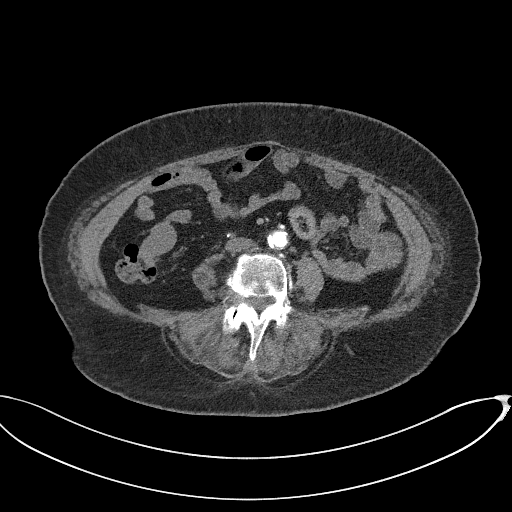
[im 376/447  lung]
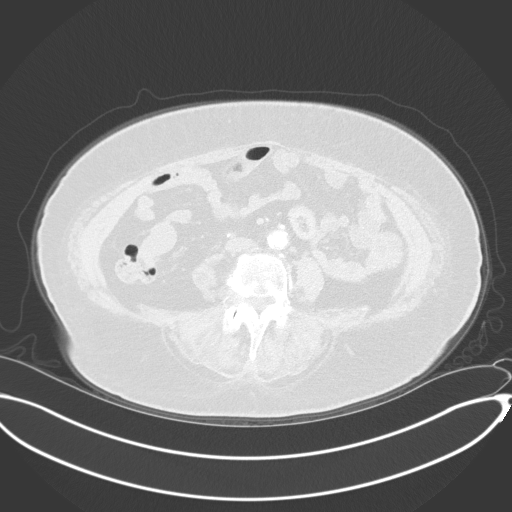
[im 400/447  lung]
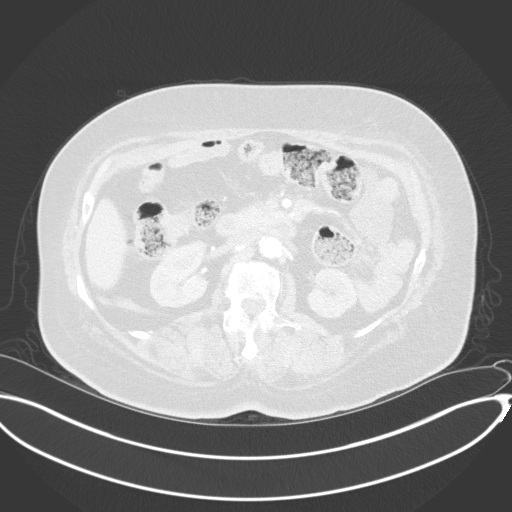
[im 423/447  soft-tissue]
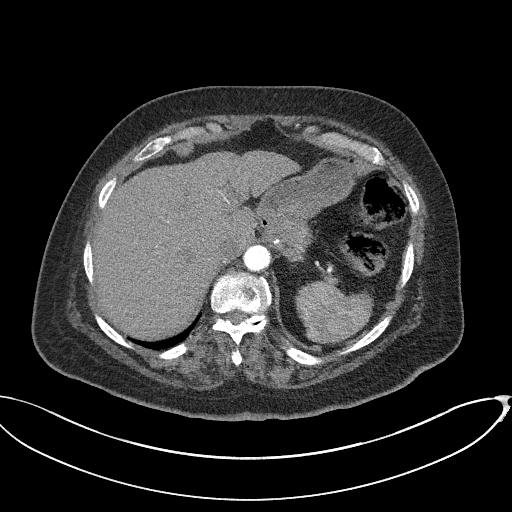
[im 423/447  lung]
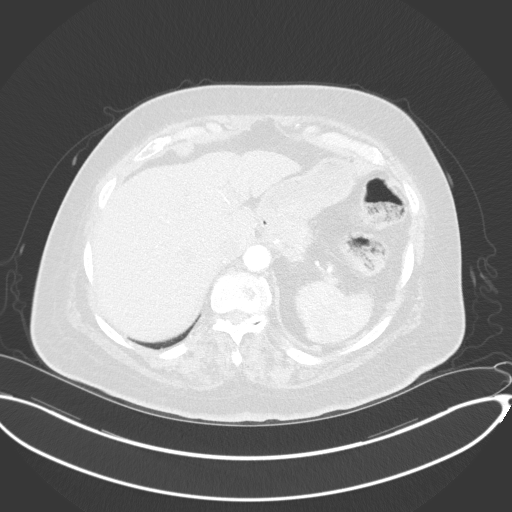
[im 423/447  bone]
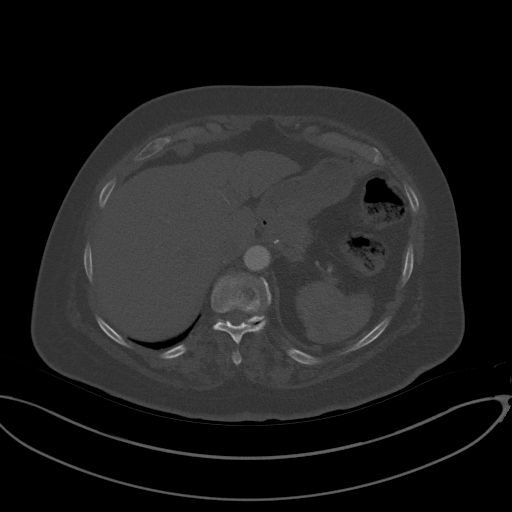

[11 of 46 positions shown; findings below may reference images not displayed]

RADIATION DOSE REDUCTION: This exam was performed according to the
departmental dose-optimization program which includes automated
exposure control, adjustment of the mA and/or kV according to
patient size and/or use of iterative reconstruction technique.

CONTRAST:  100mL OMNIPAQUE IOHEXOL 350 MG/ML SOLN
FINDINGS: VASCULAR

Aorta: Normal caliber of the distal descending thoracic aorta and
abdominal aorta. Circumferential mural thrombus and plaque in the
infrarenal abdominal aorta without significant stenosis. Negative
for an aortic dissection.

Celiac: Calcified plaque at the origin of the celiac trunk causing
mild-to-moderate stenosis. Incidentally, there appears to be an
accessory left hepatic artery coming off the left gastric artery.

SMA: Calcified plaque at the origin of the SMA causing mild
stenosis. Main SMA branches are patent.

Renals: Mixed plaque at the origin of the right renal artery area
causing approximately 50% stenosis. Large amount of noncalcified
plaque at the origin of the left renal artery causing 70% or greater
stenosis at the origin and proximal aspect. No evidence for aneurysm
or dissection involving the renal arteries.

IMA: Patent

RIGHT Lower Extremity

Inflow: Normal caliber of the right iliac arteries. Mild narrowing
in the right common iliac artery. Right common, internal and
external iliac arteries are patent.

Outflow: Common, superficial and profunda femoral arteries and the
popliteal artery are patent without evidence of aneurysm,
dissection, vasculitis or significant stenosis. Segments of mild
stenosis in the right popliteal artery.

Runoff: Proximal anterior tibial artery is patent. There is focal
stenosis in the proximal/mid right anterior tibial artery. Short
segment occlusion of the anterior tibial artery in the mid calf.
Segmental areas of occlusive disease in the distal anterior tibial
artery. There is flow in the dorsalis pedis artery. Occlusive
disease involving the tibioperoneal trunk. There is reconstitution
of the proximal peroneal artery. Peroneal artery is patent down to
the ankle. No flow in the posterior tibial artery.

LEFT Lower Extremity

Inflow: Common, internal and external iliac arteries are patent
without evidence of aneurysm, dissection, vasculitis or significant
stenosis.

Outflow: Common, superficial and profunda femoral arteries are
patent without evidence of aneurysm, dissection, vasculitis or
significant stenosis. Stenosis and irregularity in the distal left
popliteal artery starting at the level of the knee joint.
Mild-to-moderate stenosis involving the distal popliteal artery.

Runoff: Proximal anterior tibial artery is patent. There is concern
for focal high-grade stenosis or occlusion in the mid left anterior
tibial artery. Distal anterior tibial artery is patent. Dorsalis
pedis artery is patent. Tibioperoneal trunk is diffusely diseased.
Proximal peroneal artery is occluded. Segmental areas of
reconstitution in the peroneal artery. No significant flow in the
posterior tibial artery.

Veins: No obvious venous abnormality within the limitations of this
arterial phase study.

Review of the MIP images confirms the above findings.

NON-VASCULAR

Lower chest: Hazy densities at the left lung base likely represent
atelectasis. No pleural effusions.

Hepatobiliary: Normal appearance of the liver and gallbladder.

Pancreas: Unremarkable. No pancreatic ductal dilatation or
surrounding inflammatory changes.

Spleen: Normal in size without focal abnormality.

Adrenals/Urinary Tract: 9 mm nodule in the right adrenal gland is
minimally changed since 8328. Mild fullness in left adrenal gland
appears chronic. Normal appearance of both kidneys without
hydronephrosis. No suspicious renal lesions. Fluid in the urinary
bladder but limited evaluation due to artifact from the right hip
replacement.

Stomach/Bowel: Stable postsurgical changes compatible with a
previous fundoplication. No bowel dilatation or focal bowel
inflammation. Normal appendix.

Lymphatic: No lymph node enlargement in the abdomen or pelvis.

Reproductive: Status post hysterectomy. No adnexal masses.

Other: Negative for ascites.  Periumbilical hernia containing fat.

Musculoskeletal: Right hip arthroplasty is located. Degenerative
changes at the symphysis pubis. Small bilateral knee effusions. Disc
space narrowing at L3-L4. Multiple levels with vacuum disc
phenomenon.
IMPRESSION: VASCULAR

1. No significant inflow disease.
2. Mild-to-moderate outflow disease in the distal left popliteal
artery. No significant right outflow disease.
3. Severe bilateral runoff disease. Segmental occlusive disease in
both lower extremities.
4. Bilateral renal artery stenosis. Left renal artery stenosis is
severe measuring 70% or greater.
5. Mild-to-moderate stenosis at the origin of the celiac artery.
6. Mild stenosis at the origin of the SMA.

NON-VASCULAR

1. No acute abnormality in the abdomen or pelvis.
2. Ventral hernia containing fat.
3. Stable appearance of postsurgical changes in the stomach.
4. Stable 9 mm nodule involving the right adrenal gland. This nodule
is technically indeterminate on this examination but favor a benign
etiology based on the size and stability.

## 2023-12-16 ENCOUNTER — Ambulatory Visit: Attending: Vascular Surgery | Admitting: Physician Assistant

## 2023-12-16 VITALS — BP 158/101 | HR 78 | Temp 97.7°F | Ht 65.0 in | Wt 186.7 lb

## 2023-12-16 DIAGNOSIS — I739 Peripheral vascular disease, unspecified: Secondary | ICD-10-CM

## 2023-12-16 MED ORDER — TRAMADOL HCL 50 MG PO TABS: 50.0000 mg | ORAL_TABLET | Freq: Four times a day (QID) | ORAL | 1 refills | Status: DC | PRN

## 2023-12-16 NOTE — Progress Notes (Signed)
 POST OPERATIVE OFFICE NOTE    CC:  F/u for surgery  HPI:  This is a 78 y.o. female who is s/p  11/16/23 Initiation of SFA, popliteal artery pharmacomechanical thrombolysis angiogram showing: popliteal artery is occluded with reconstitution distally in the P3 segment of the below-knee popliteal artery. The anterior tibial artery is patent initially, ending in collaterals. There is significant disease. The tibioperoneal trunk is occluded. The peroneal artery reconstitutes roughly 10 cm from the occlusion providing outflow to the foot via medial and lateral perforators. Flow in the foot could not be assessed due to the poor flow.   11/17/23 Initial angiogram showed widely patent common femoral, profunda, SFA in the right lower extremity.  The above-knee popliteal occlusion was now patent with a residual high-grade stenosis in the above-knee popliteal artery.  Continues to have occluded trifurcation with TP trunk occlusion and occlusion of the proximal peroneal and only distal reconstitution of a peroneal through collaterals.   11/22/23 Right common femoral to peroneal artery bypass with PTFE and vein cuff  She is out of her percocet from surgery and is still having pain at the lower leg incision and in the toes.  She is ambulatory for short distances with a walker.  She has not showered and she has not been elevating her leg.  The distal incision has SS/bloody drainage.    She is medical managed on Eliquis , ASA and a daily Statin.          Allergies  Allergen Reactions   Lovastatin Itching and Rash   Nitrofurantoin Rash    Current Outpatient Medications  Medication Sig Dispense Refill   acetaminophen  (TYLENOL ) 500 MG tablet Take 1,000 mg by mouth every 6 (six) hours as needed for moderate pain.     Alcohol  Swabs (B-D SINGLE USE SWABS REGULAR) PADS      amLODipine  (NORVASC ) 10 MG tablet Take 10 mg by mouth daily.     apixaban  (ELIQUIS ) 5 MG TABS tablet Take 1 tablet (5 mg total) by  mouth 2 (two) times daily. 60 tablet 3   aspirin  81 MG chewable tablet Chew 81 mg by mouth daily.     Blood Glucose Monitoring Suppl (TRUE METRIX AIR GLUCOSE METER) w/Device KIT      cilostazol  (PLETAL ) 50 MG tablet Take 1 tablet (50 mg total) by mouth 2 (two) times daily. 180 tablet 3   ezetimibe  (ZETIA ) 10 MG tablet Take 1 tablet (10 mg total) by mouth every evening. 90 tablet 3   gabapentin  (NEURONTIN ) 100 MG capsule Take 1 capsule (100 mg total) by mouth 3 (three) times daily. 90 capsule 3   ibuprofen  (ADVIL ) 200 MG tablet Take 400 mg by mouth daily as needed for moderate pain.     insulin  aspart (NOVOLOG ) 100 UNIT/ML FlexPen Inject 10 Units into the skin 3 (three) times daily as needed for high blood sugar. Takes Novolog  10 units with meals (also takes a few extra units if glucose is elevated)     insulin  glargine (LANTUS ) 100 UNIT/ML injection Inject 19 Units into the skin daily.     Insulin  Syringe-Needle U-100 31G X 5/16" 0.5 ML MISC Use one syringe twice daily DX:  250.70     KLOR-CON  M20 20 MEQ tablet Take 20 mEq by mouth daily.  0   losartan -hydrochlorothiazide  (HYZAAR) 50-12.5 MG tablet Take 1 tablet by mouth every morning.     omeprazole (PRILOSEC) 20 MG capsule Take 20 mg by mouth daily as needed (acid reflux).  oxyCODONE  (OXY IR/ROXICODONE ) 5 MG immediate release tablet Take 1 tablet (5 mg total) by mouth every 6 (six) hours as needed for severe pain (pain score 7-10) or breakthrough pain. 20 tablet 0   rosuvastatin  (CRESTOR ) 20 MG tablet TAKE 1 TABLET EVERY DAY (DISCONTINUE PRAVASTATIN ) 90 tablet 0   TRUE METRIX BLOOD GLUCOSE TEST test strip      TRUEplus Lancets 33G MISC      No current facility-administered medications for this visit.     ROS:  See HPI  Physical Exam:           Incision:  groin incision healing well, lower leg wound with minimal ss/bloody drainage Extremities:  edema in the lower leg from the knee to the ankle Neuro: motor of ankle and minimal  toe right LE Lungs :  non labored breathing    Assessment/Plan:    Acute on chronic critical limb ischemia right lower extremity   This is a 78 y.o. female who is s/p: attempted thrombolysis followed by Right common femoral to peroneal artery bypass with PTFE and vein cuff.  The bypass is open with doppler signals in the DP/PT, peroneal signal sounds venous. I will schedule her for a return visit to get a bypass duplex and ABI's in the next 1-2 weeks.    Pain with edema and second /third toe demarcating.  The groin incision is healing very well.  We will continue to observe her toes.  If she develops open drainage on the toes she will call us , or if she has fever or chills.  I will order Tramadol  for pain 1 q 6 PRN # 30, elevation alternating with walking.  She may remove the lower leg dressing and shower daily as needed.      Rocky Cipro PA-C [facility] [phone]   Clinic MD:  Susi Eric

## 2023-12-19 ENCOUNTER — Ambulatory Visit: Attending: Surgery | Admitting: Physician Assistant

## 2023-12-19 VITALS — BP 161/75 | HR 84 | Temp 97.5°F | Ht 65.0 in | Wt 187.0 lb

## 2023-12-19 DIAGNOSIS — S81801A Unspecified open wound, right lower leg, initial encounter: Secondary | ICD-10-CM

## 2023-12-19 NOTE — Progress Notes (Signed)
 POST OPERATIVE OFFICE NOTE    CC:  F/u for surgery  HPI:  Catherine Ramsey is a 78 y.o. female who is here as a triage visit.  She recently underwent right common femoral to peroneal artery bypass with PTFE and vein cuff on 11/22/2023 by Dr. Susi Eric.  This was done for critical limb ischemia with tissue loss.  She was seen at our office on Friday and was doing okay.  She did have some right lower leg edema and drainage from her calf incision.  She presents today as a triage visit.  She says her right lower leg incision opened up spontaneously on Saturday.  She has kept a dry bandage on it since then.  She endorses some serosanguineous drainage.  She denies any foul-smelling drainage, fevers, or chills.  Both the patient and her husband think that her wound looks a little bit smaller at today's visit.   Allergies  Allergen Reactions   Lovastatin Itching and Rash   Nitrofurantoin Rash    Current Outpatient Medications  Medication Sig Dispense Refill   acetaminophen  (TYLENOL ) 500 MG tablet Take 1,000 mg by mouth every 6 (six) hours as needed for moderate pain.     Alcohol  Swabs (B-D SINGLE USE SWABS REGULAR) PADS      amLODipine  (NORVASC ) 10 MG tablet Take 10 mg by mouth daily.     apixaban  (ELIQUIS ) 5 MG TABS tablet Take 1 tablet (5 mg total) by mouth 2 (two) times daily. 60 tablet 3   aspirin  81 MG chewable tablet Chew 81 mg by mouth daily.     Blood Glucose Monitoring Suppl (TRUE METRIX AIR GLUCOSE METER) w/Device KIT      cilostazol  (PLETAL ) 50 MG tablet Take 1 tablet (50 mg total) by mouth 2 (two) times daily. 180 tablet 3   gabapentin  (NEURONTIN ) 100 MG capsule Take 1 capsule (100 mg total) by mouth 3 (three) times daily. 90 capsule 3   ibuprofen  (ADVIL ) 200 MG tablet Take 400 mg by mouth daily as needed for moderate pain.     insulin  aspart (NOVOLOG ) 100 UNIT/ML FlexPen Inject 10 Units into the skin 3 (three) times daily as needed for high blood sugar. Takes Novolog  10 units with  meals (also takes a few extra units if glucose is elevated)     insulin  glargine (LANTUS ) 100 UNIT/ML injection Inject 19 Units into the skin daily.     Insulin  Syringe-Needle U-100 31G X 5/16" 0.5 ML MISC Use one syringe twice daily DX:  250.70     KLOR-CON  M20 20 MEQ tablet Take 20 mEq by mouth daily.  0   losartan -hydrochlorothiazide  (HYZAAR) 50-12.5 MG tablet Take 1 tablet by mouth every morning.     omeprazole (PRILOSEC) 20 MG capsule Take 20 mg by mouth daily as needed (acid reflux).     rosuvastatin  (CRESTOR ) 20 MG tablet TAKE 1 TABLET EVERY DAY (DISCONTINUE PRAVASTATIN ) 90 tablet 0   traMADol  (ULTRAM ) 50 MG tablet Take 1 tablet (50 mg total) by mouth every 6 (six) hours as needed. 30 tablet 1   TRUE METRIX BLOOD GLUCOSE TEST test strip      TRUEplus Lancets 33G MISC      ezetimibe  (ZETIA ) 10 MG tablet Take 1 tablet (10 mg total) by mouth every evening. 90 tablet 3   No current facility-administered medications for this visit.     ROS:  See HPI  Physical Exam:   Incision: Dehisced right lower leg incision without purulent or foul smelling drainage. The wound  bed does not tunnel deeper than what is visualized Extremities:  brisk right DP/PT doppler signals Neuro: intact motor and sensation of right leg       Assessment/Plan:  This is a 78 y.o. female who is here for a triage visit  -The patient recently underwent right common femoral to peroneal artery bypass with PTFE for tissue loss -At her triage visit today she reports her lower leg incision dehisced spontaneously on Saturday. She endorses serosanguineous drainage from this area -On exam her right lower leg incision is dehisced without obvious signs of infection. She has no pus-like drainage. Dr.Brabham was present during the exam and agrees that the wound appears superficial in nature. He probed the area with a q-tip and it does not have any areas of tunneling -We will attempt to heal her wound conservatively at this  time. I have shown the patient and family how to perform daily wet to dry dressing changes to this area. They should do this 1-2x daily.  -She will return to clinic on Friday for repeat wound check. She is aware if her wound begins to worsen or create foul smelling drainage to call sooner   Deneise Finlay, PA-C [facility] [phone]   Clinic MD:  Charlotte Cookey

## 2023-12-21 ENCOUNTER — Other Ambulatory Visit: Payer: Self-pay

## 2023-12-21 ENCOUNTER — Telehealth: Payer: Self-pay

## 2023-12-21 ENCOUNTER — Other Ambulatory Visit: Payer: Self-pay | Admitting: *Deleted

## 2023-12-21 DIAGNOSIS — S81801A Unspecified open wound, right lower leg, initial encounter: Secondary | ICD-10-CM

## 2023-12-21 DIAGNOSIS — I739 Peripheral vascular disease, unspecified: Secondary | ICD-10-CM

## 2023-12-21 NOTE — Telephone Encounter (Signed)
 Attempted to return [place], PT with [facility] phone call regarding orders for pt to start [facility] and hold PT while her incision heals. He did not answer several of my attempts and his VM is under the name of someone else.

## 2023-12-21 NOTE — Telephone Encounter (Signed)
 Reached out to patient's VNA service that she is being seen for PT/OT to set up nursing services at this time. I was informed by Louanna Rouse at that office that they are short of nursing at this time, she is unsure if they are able to take this patient at this time. She will have one of the nursing personal review the chart and get back to us  and let us  know if they are able to accommodate her for these services. I told her that the referral is in EPIC and that the nurse can review the note from our provider on the 19th with the photos to see what the wound looks like and what she is wanting for dressings. She stated that she would pass this on to the nursing staff and they will review the chart and get back to us . I will notify the referring provider of above.

## 2023-12-22 NOTE — Progress Notes (Signed)
 Patient ID: Catherine Ramsey, female   DOB: 01-14-1946, 78 y.o.   MRN: [id number]  Reason for Consult: Wound Check   Referred by Bertha Broad, MD  Subjective:  HPI Catherine Ramsey is a 78 y.o. female presenting for incision check follow-up.  She underwent right common femoral to peroneal artery bypass with PTFE and vein cuff on 420 09/2023.  She she presented for triage visit last week due to the lower leg incision opening up.  At that time the bypass was not visualized in the wound and there was some healthy granulation tissue.  She denies fevers, chills.  The wound has continued to heal and is more superficial than when she was seen 4 days ago.  Past Medical History:  Diagnosis Date   Achalasia - Type II 05/08/2010   Qualifier: Diagnosis of  By: Willy Harvest MD, Catherine Ramsey    Acute kidney injury (HCC) 02/26/2019   Candida esophagitis (HCC) 03/26/2014   Cataract    COVID-19 virus detected 02/25/2019   Diabetes mellitus    type 2   Family history of adverse reaction to anesthesia    son hard to wake up   GERD (gastroesophageal reflux disease)    Hyperlipidemia    Hypertension    Peripheral vascular disease (HCC)    Primary hypertension 02/25/2019   Syncope 02/25/2019   Trigger ring finger of right hand 10/07/2016   Family History  Problem Relation Age of Onset   Diabetes Mother        died at 88 from Covid-19   Hypertension Mother    Hyperlipidemia Mother    Breast cancer Mother    Diabetes Father    Hypertension Father    Hypertension Sister    Hypertension Sister    Colon polyps Daughter    Colon cancer Neg Hx    Esophageal cancer Neg Hx    Rectal cancer Neg Hx    Stomach cancer Neg Hx    Past Surgical History:  Procedure Laterality Date   ABDOMINAL AORTOGRAM N/A 11/16/2023   Procedure: ABDOMINAL AORTOGRAM;  Surgeon: Kayla Part, MD;  Location: [facility] INVASIVE CV LAB;  Service: Cardiovascular;  Laterality: N/A;   ABDOMINAL HYSTERECTOMY     BREAST BIOPSY      left/benign   BYPASS GRAFT FEMORAL-PERONEAL Right 11/22/2023   Procedure: CREATION, BYPASS, ARTERIAL, FEMORAL TO PERONEAL, USING PROPATEN X 80CM GRAFT;  Surgeon: Philipp Brawn, MD;  Location: [facility] OR;  Service: Vascular;  Laterality: Right;   COLONOSCOPY  01/31/2014   ESOPHAGEAL MANOMETRY N/A 04/01/2014   Procedure: ESOPHAGEAL MANOMETRY (EM);  Surgeon: Kenney Peacemaker, MD;  Location: [facility] ENDOSCOPY;  Service: Endoscopy;  Laterality: N/A;   HELLER MYOTOMY N/A 12/07/2016   Procedure: LAPAROSCOPIC HELLER MYOTOMY, DOR FUNDOPLICATION, HIATAL HERNIA REPAIR, INTRAOPERATIVE UPPER ENDOSCOPY;  Surgeon: Adalberto Hollow, MD;  Location: [facility] ORS;  Service: General;  Laterality: N/A;   IR ANGIOGRAM PELVIS SELECTIVE OR SUPRASELECTIVE  10/20/2021   IR RADIOLOGIST EVAL & MGMT  10/01/2021   IR RADIOLOGIST EVAL & MGMT  11/25/2021   IR RADIOLOGIST EVAL & MGMT  11/04/2022   IR TIB-PERO ART ATHEREC INC PTA MOD SED  10/15/2021   IR US  GUIDE VASC ACCESS RIGHT  10/15/2021   LOWER EXTREMITY ANGIOGRAPHY Right 11/16/2023   Procedure: Lower Extremity Angiography;  Surgeon: Kayla Part, MD;  Location: [facility] INVASIVE CV LAB;  Service: Cardiovascular;  Laterality: Right;   LOWER EXTREMITY INTERVENTION Right 11/16/2023   Procedure: LOWER EXTREMITY  INTERVENTION;  Surgeon: Kayla Part, MD;  Location: [facility] INVASIVE CV LAB;  Service: Cardiovascular;  Laterality: Right;   PERIPHERAL VASCULAR THROMBECTOMY N/A 11/17/2023   Procedure: PERIPHERAL VASCULAR THROMBECTOMY;  Surgeon: Young Hensen, MD;  Location: [facility] INVASIVE CV LAB;  Service: Vascular;  Laterality: N/A;   TOTAL HIP ARTHROPLASTY     right pin then replacement   UPPER GASTROINTESTINAL ENDOSCOPY      Short Social History:  Social History   Tobacco Use   Smoking status: Never   Smokeless tobacco: Never  Substance Use Topics   Alcohol  use: No    Allergies  Allergen Reactions   Lovastatin Itching and Rash   Nitrofurantoin Rash    Current Outpatient  Medications  Medication Sig Dispense Refill   acetaminophen  (TYLENOL ) 500 MG tablet Take 1,000 mg by mouth every 6 (six) hours as needed for moderate pain.     Alcohol  Swabs (B-D SINGLE USE SWABS REGULAR) PADS      amLODipine  (NORVASC ) 10 MG tablet Take 10 mg by mouth daily.     apixaban  (ELIQUIS ) 5 MG TABS tablet Take 1 tablet (5 mg total) by mouth 2 (two) times daily. 60 tablet 3   aspirin  81 MG chewable tablet Chew 81 mg by mouth daily.     Blood Glucose Monitoring Suppl (TRUE METRIX AIR GLUCOSE METER) w/Device KIT      cilostazol  (PLETAL ) 50 MG tablet Take 1 tablet (50 mg total) by mouth 2 (two) times daily. 180 tablet 3   gabapentin  (NEURONTIN ) 100 MG capsule Take 1 capsule (100 mg total) by mouth 3 (three) times daily. 90 capsule 3   ibuprofen  (ADVIL ) 200 MG tablet Take 400 mg by mouth daily as needed for moderate pain.     insulin  aspart (NOVOLOG ) 100 UNIT/ML FlexPen Inject 10 Units into the skin 3 (three) times daily as needed for high blood sugar. Takes Novolog  10 units with meals (also takes a few extra units if glucose is elevated)     insulin  glargine (LANTUS ) 100 UNIT/ML injection Inject 19 Units into the skin daily.     Insulin  Syringe-Needle U-100 31G X 5/16" 0.5 ML MISC Use one syringe twice daily DX:  250.70     KLOR-CON  M20 20 MEQ tablet Take 20 mEq by mouth daily.  0   losartan -hydrochlorothiazide  (HYZAAR) 50-12.5 MG tablet Take 1 tablet by mouth every morning.     omeprazole (PRILOSEC) 20 MG capsule Take 20 mg by mouth daily as needed (acid reflux).     rosuvastatin  (CRESTOR ) 20 MG tablet TAKE 1 TABLET EVERY DAY (DISCONTINUE PRAVASTATIN ) 90 tablet 0   traMADol  (ULTRAM ) 50 MG tablet Take 1 tablet (50 mg total) by mouth every 6 (six) hours as needed. 30 tablet 1   TRUE METRIX BLOOD GLUCOSE TEST test strip      TRUEplus Lancets 33G MISC      ezetimibe  (ZETIA ) 10 MG tablet Take 1 tablet (10 mg total) by mouth every evening. 90 tablet 3   No current facility-administered  medications for this visit.    REVIEW OF SYSTEMS  All other systems reviewed and are negative  Objective:  Objective   Vitals:   12/23/23 0945  BP: 107/65  Pulse: 95  Resp: 18  Temp: 97.7 F (36.5 C)  TempSrc: Temporal  SpO2: 98%  Weight: 181 lb 4.8 oz (82.2 kg)  Height: 5\' 5"  (1.651 m)   Body mass index is 30.17 kg/m.  Physical Exam General: no acute distress Cardiac: hemodynamically stable  Pulm: normal work of breathing Extremities: Dehiscence of right lower leg incision, more superficial with granulation at base that when she was seen 4 days ago.  No surrounding erythema.  Minimal serous drainage. Vascular:   Right: Biphasic DP and peroneal     Assessment/Plan:     Catherine Ramsey is a 78 y.o. female with CL TI who underwent a right femoral to peroneal artery bypass with PTFE and vein cuff on 11/22/2023.  This was complicated by lower leg incision dehiscence.  The wound is more superficial than when she was seen last week and appears to be healing in well.  There is granulation tissue at the base.  There is no signs of infection. Plan for wound check follow-up in 2 weeks She needs a refill of her Eliquis  which I will prescribe, she is on this due to this being considered a threatened bypass with PTFE to a peroneal one-vessel runoff.     Philipp Brawn MD [facility]

## 2023-12-23 ENCOUNTER — Ambulatory Visit: Attending: Vascular Surgery | Admitting: Vascular Surgery

## 2023-12-23 ENCOUNTER — Encounter: Payer: Self-pay | Admitting: Vascular Surgery

## 2023-12-23 VITALS — BP 107/65 | HR 95 | Temp 97.7°F | Resp 18 | Ht 65.0 in | Wt 181.3 lb

## 2023-12-23 DIAGNOSIS — I70221 Atherosclerosis of native arteries of extremities with rest pain, right leg: Secondary | ICD-10-CM | POA: Diagnosis not present

## 2023-12-23 MED ORDER — APIXABAN 5 MG PO TABS: 5.0000 mg | ORAL_TABLET | Freq: Two times a day (BID) | ORAL | 1 refills | Status: DC

## 2023-12-23 NOTE — Addendum Note (Signed)
 Addended by: Delaney Fearing on: 12/23/2023 10:09 AM   Modules accepted: Orders

## 2023-12-29 ENCOUNTER — Telehealth: Payer: Self-pay

## 2023-12-29 NOTE — Telephone Encounter (Signed)
 [place], PT @ [facility] called stating the patient is requesting discharge from their services.  Patient complains that the PT hurts and she wants to d/c the service.   Verbal order given to d/c PT per patient request.

## 2023-12-30 ENCOUNTER — Telehealth: Payer: Self-pay

## 2023-12-30 NOTE — Telephone Encounter (Signed)
 Physical therapist with [facility] called asking to postpone patient's discharge one week.  Verbal order given to discharge patient one week from today, January 06, 2024.

## 2024-01-03 ENCOUNTER — Telehealth: Payer: Self-pay

## 2024-01-03 NOTE — Telephone Encounter (Signed)
 Camilo Cella with [facility] called to get VO for pt to have OT d/c visit moved a week, due to pt not being available. VO given.

## 2024-01-04 ENCOUNTER — Telehealth: Payer: Self-pay

## 2024-01-04 ENCOUNTER — Other Ambulatory Visit: Payer: Self-pay | Admitting: Physician Assistant

## 2024-01-04 DIAGNOSIS — I739 Peripheral vascular disease, unspecified: Secondary | ICD-10-CM

## 2024-01-04 NOTE — Telephone Encounter (Signed)
 Angel Kelch RN, called to inquire about pt's wound on her right leg.  RN was concerned that the wound may be infected. RN reported a 11x1 cm healing wound with white/yellow exudate that is extremely sore.  No fever reported. Pt is taking tylenol  for pain control. RN applied a Silver wet-to-dry dressing.  After consulting McKenzi, PA and further conversation with Cicelia, the decision was made to continue wound care and to keep pt's follow-up appointment on 01/06/2024. Cicelia will communicate to pt that should she start having a fever to call us  back.  Called pt to discuss but there was no answer.   Will try to call back later.

## 2024-01-05 NOTE — H&P (View-Only) (Signed)
 Patient ID: Catherine Ramsey, female   DOB: 1945/09/17, 79 y.o.   MRN: [id number]  Reason for Consult: No chief complaint on file.   Referred by Bertha Broad, MD  Subjective:     HPI Catherine Ramsey is a 78 y.o. female presenting for incision check follow-up. She underwent right common femoral to peroneal artery bypass with PTFE and vein cuff on 420 09/2023. She she presented for triage visit a few weeks ago due to the lower leg incision opening up.  I saw her 2 weeks ago and the wound was much more superficial.  Today she reports that she is having some pain around the wound in the home health nurse was worried about its recent progression.  She denies fevers or chills.  She denies any drainage.  Past Medical History:  Diagnosis Date   Achalasia - Type II 05/08/2010   Qualifier: Diagnosis of  By: Willy Harvest MD, Kelleen Patee E    Acute kidney injury (HCC) 02/26/2019   Candida esophagitis (HCC) 03/26/2014   Cataract    COVID-19 virus detected 02/25/2019   Diabetes mellitus    type 2   Family history of adverse reaction to anesthesia    son hard to wake up   GERD (gastroesophageal reflux disease)    Hyperlipidemia    Hypertension    Peripheral vascular disease (HCC)    Primary hypertension 02/25/2019   Syncope 02/25/2019   Trigger ring finger of right hand 10/07/2016   Family History  Problem Relation Age of Onset   Diabetes Mother        died at 21 from Covid-19   Hypertension Mother    Hyperlipidemia Mother    Breast cancer Mother    Diabetes Father    Hypertension Father    Hypertension Sister    Hypertension Sister    Colon polyps Daughter    Colon cancer Neg Hx    Esophageal cancer Neg Hx    Rectal cancer Neg Hx    Stomach cancer Neg Hx    Past Surgical History:  Procedure Laterality Date   ABDOMINAL AORTOGRAM N/A 11/16/2023   Procedure: ABDOMINAL AORTOGRAM;  Surgeon: Kayla Part, MD;  Location: [facility] INVASIVE CV LAB;  Service: Cardiovascular;  Laterality:  N/A;   ABDOMINAL HYSTERECTOMY     BREAST BIOPSY     left/benign   BYPASS GRAFT FEMORAL-PERONEAL Right 11/22/2023   Procedure: CREATION, BYPASS, ARTERIAL, FEMORAL TO PERONEAL, USING PROPATEN X 80CM GRAFT;  Surgeon: Catherine Brawn, MD;  Location: [facility] OR;  Service: Vascular;  Laterality: Right;   COLONOSCOPY  01/31/2014   ESOPHAGEAL MANOMETRY N/A 04/01/2014   Procedure: ESOPHAGEAL MANOMETRY (EM);  Surgeon: Kenney Peacemaker, MD;  Location: [facility] ENDOSCOPY;  Service: Endoscopy;  Laterality: N/A;   HELLER MYOTOMY N/A 12/07/2016   Procedure: LAPAROSCOPIC HELLER MYOTOMY, DOR FUNDOPLICATION, HIATAL HERNIA REPAIR, INTRAOPERATIVE UPPER ENDOSCOPY;  Surgeon: Adalberto Hollow, MD;  Location: [facility] ORS;  Service: General;  Laterality: N/A;   IR ANGIOGRAM PELVIS SELECTIVE OR SUPRASELECTIVE  10/20/2021   IR RADIOLOGIST EVAL & MGMT  10/01/2021   IR RADIOLOGIST EVAL & MGMT  11/25/2021   IR RADIOLOGIST EVAL & MGMT  11/04/2022   IR TIB-PERO ART ATHEREC INC PTA MOD SED  10/15/2021   IR US  GUIDE VASC ACCESS RIGHT  10/15/2021   LOWER EXTREMITY ANGIOGRAPHY Right 11/16/2023   Procedure: Lower Extremity Angiography;  Surgeon: Kayla Part, MD;  Location: [facility] INVASIVE CV LAB;  Service: Cardiovascular;  Laterality: Right;  LOWER EXTREMITY INTERVENTION Right 11/16/2023   Procedure: LOWER EXTREMITY INTERVENTION;  Surgeon: Kayla Part, MD;  Location: [facility] INVASIVE CV LAB;  Service: Cardiovascular;  Laterality: Right;   PERIPHERAL VASCULAR THROMBECTOMY N/A 11/17/2023   Procedure: PERIPHERAL VASCULAR THROMBECTOMY;  Surgeon: Young Hensen, MD;  Location: [facility] INVASIVE CV LAB;  Service: Vascular;  Laterality: N/A;   TOTAL HIP ARTHROPLASTY     right pin then replacement   UPPER GASTROINTESTINAL ENDOSCOPY      Short Social History:  Social History   Tobacco Use   Smoking status: Never   Smokeless tobacco: Never  Substance Use Topics   Alcohol  use: No    Allergies  Allergen Reactions   Lovastatin Itching and  Rash   Nitrofurantoin Rash    Current Outpatient Medications  Medication Sig Dispense Refill   acetaminophen  (TYLENOL ) 500 MG tablet Take 1,000 mg by mouth every 6 (six) hours as needed for moderate pain.     Alcohol  Swabs (B-D SINGLE USE SWABS REGULAR) PADS      amLODipine  (NORVASC ) 10 MG tablet Take 10 mg by mouth daily.     apixaban  (ELIQUIS ) 5 MG TABS tablet Take 1 tablet (5 mg total) by mouth 2 (two) times daily. 180 tablet 1   aspirin  81 MG chewable tablet Chew 81 mg by mouth daily.     Blood Glucose Monitoring Suppl (TRUE METRIX AIR GLUCOSE METER) w/Device KIT      cilostazol  (PLETAL ) 50 MG tablet Take 1 tablet (50 mg total) by mouth 2 (two) times daily. 180 tablet 3   ezetimibe  (ZETIA ) 10 MG tablet Take 1 tablet (10 mg total) by mouth every evening. 90 tablet 3   gabapentin  (NEURONTIN ) 100 MG capsule Take 1 capsule (100 mg total) by mouth 3 (three) times daily. 90 capsule 3   ibuprofen  (ADVIL ) 200 MG tablet Take 400 mg by mouth daily as needed for moderate pain.     insulin  aspart (NOVOLOG ) 100 UNIT/ML FlexPen Inject 10 Units into the skin 3 (three) times daily as needed for high blood sugar. Takes Novolog  10 units with meals (also takes a few extra units if glucose is elevated)     insulin  glargine (LANTUS ) 100 UNIT/ML injection Inject 19 Units into the skin daily.     Insulin  Syringe-Needle U-100 31G X 5/16" 0.5 ML MISC Use one syringe twice daily DX:  250.70     KLOR-CON  M20 20 MEQ tablet Take 20 mEq by mouth daily.  0   losartan -hydrochlorothiazide  (HYZAAR) 50-12.5 MG tablet Take 1 tablet by mouth every morning.     omeprazole (PRILOSEC) 20 MG capsule Take 20 mg by mouth daily as needed (acid reflux).     rosuvastatin  (CRESTOR ) 20 MG tablet TAKE 1 TABLET EVERY DAY (DISCONTINUE PRAVASTATIN ) 90 tablet 0   traMADol  (ULTRAM ) 50 MG tablet Take 1 tablet (50 mg total) by mouth every 6 (six) hours as needed. 30 tablet 1   TRUE METRIX BLOOD GLUCOSE TEST test strip      TRUEplus Lancets  33G MISC      No current facility-administered medications for this visit.    REVIEW OF SYSTEMS  All other systems were reviewed and are negative     Objective:  Objective   There were no vitals filed for this visit. There is no height or weight on file to calculate BMI.  Physical Exam General: no acute distress Cardiac: hemodynamically stable Pulm: normal work of breathing Neuro: alert, no focal deficit Extremities: Right lower leg wound  with increased granulation and is more superficial although there is buildup of fibrinous exudate and the wound edges are black and necrotic.  No purulence expressed, no surrounding erythema      Assessment/Plan:   Catherine Ramsey is a 78 y.o. female with CL TI who underwent a right femoral to peroneal artery bypass with PTFE and vein cuff on 11/22/2023.  This was complicated by lower leg incision dehiscence. The wound has become more superficial with granulation tissue at the base although there is a fibrinous buildup and the wound edges have become necrotic.  She has not had fevers or chills or significant drainage and I do not believe there is underlying infection although I do think that she requires a wound debridement with possible wound VAC placement. Will plan to perform this on Tuesday and I explained I would like her to stay 1 night for further evaluation and potentially obtain a CT to rule out any deeper infection.   Catherine Brawn MD [facility]

## 2024-01-05 NOTE — Progress Notes (Unsigned)
 Patient ID: Catherine Ramsey, female   DOB: 1945/09/17, 79 y.o.   MRN: [id number]  Reason for Consult: No chief complaint on file.   Referred by Bertha Broad, MD  Subjective:     HPI Catherine Ramsey is a 78 y.o. female presenting for incision check follow-up. She underwent right common femoral to peroneal artery bypass with PTFE and vein cuff on 420 09/2023. She she presented for triage visit a few weeks ago due to the lower leg incision opening up.  I saw her 2 weeks ago and the wound was much more superficial.  Today she reports that she is having some pain around the wound in the home health nurse was worried about its recent progression.  She denies fevers or chills.  She denies any drainage.  Past Medical History:  Diagnosis Date   Achalasia - Type II 05/08/2010   Qualifier: Diagnosis of  By: Willy Harvest MD, Kelleen Patee E    Acute kidney injury (HCC) 02/26/2019   Candida esophagitis (HCC) 03/26/2014   Cataract    COVID-19 virus detected 02/25/2019   Diabetes mellitus    type 2   Family history of adverse reaction to anesthesia    son hard to wake up   GERD (gastroesophageal reflux disease)    Hyperlipidemia    Hypertension    Peripheral vascular disease (HCC)    Primary hypertension 02/25/2019   Syncope 02/25/2019   Trigger ring finger of right hand 10/07/2016   Family History  Problem Relation Age of Onset   Diabetes Mother        died at 21 from Covid-19   Hypertension Mother    Hyperlipidemia Mother    Breast cancer Mother    Diabetes Father    Hypertension Father    Hypertension Sister    Hypertension Sister    Colon polyps Daughter    Colon cancer Neg Hx    Esophageal cancer Neg Hx    Rectal cancer Neg Hx    Stomach cancer Neg Hx    Past Surgical History:  Procedure Laterality Date   ABDOMINAL AORTOGRAM N/A 11/16/2023   Procedure: ABDOMINAL AORTOGRAM;  Surgeon: Kayla Part, MD;  Location: [facility] INVASIVE CV LAB;  Service: Cardiovascular;  Laterality:  N/A;   ABDOMINAL HYSTERECTOMY     BREAST BIOPSY     left/benign   BYPASS GRAFT FEMORAL-PERONEAL Right 11/22/2023   Procedure: CREATION, BYPASS, ARTERIAL, FEMORAL TO PERONEAL, USING PROPATEN X 80CM GRAFT;  Surgeon: Philipp Brawn, MD;  Location: [facility] OR;  Service: Vascular;  Laterality: Right;   COLONOSCOPY  01/31/2014   ESOPHAGEAL MANOMETRY N/A 04/01/2014   Procedure: ESOPHAGEAL MANOMETRY (EM);  Surgeon: Kenney Peacemaker, MD;  Location: [facility] ENDOSCOPY;  Service: Endoscopy;  Laterality: N/A;   HELLER MYOTOMY N/A 12/07/2016   Procedure: LAPAROSCOPIC HELLER MYOTOMY, DOR FUNDOPLICATION, HIATAL HERNIA REPAIR, INTRAOPERATIVE UPPER ENDOSCOPY;  Surgeon: Adalberto Hollow, MD;  Location: [facility] ORS;  Service: General;  Laterality: N/A;   IR ANGIOGRAM PELVIS SELECTIVE OR SUPRASELECTIVE  10/20/2021   IR RADIOLOGIST EVAL & MGMT  10/01/2021   IR RADIOLOGIST EVAL & MGMT  11/25/2021   IR RADIOLOGIST EVAL & MGMT  11/04/2022   IR TIB-PERO ART ATHEREC INC PTA MOD SED  10/15/2021   IR US  GUIDE VASC ACCESS RIGHT  10/15/2021   LOWER EXTREMITY ANGIOGRAPHY Right 11/16/2023   Procedure: Lower Extremity Angiography;  Surgeon: Kayla Part, MD;  Location: [facility] INVASIVE CV LAB;  Service: Cardiovascular;  Laterality: Right;  LOWER EXTREMITY INTERVENTION Right 11/16/2023   Procedure: LOWER EXTREMITY INTERVENTION;  Surgeon: Kayla Part, MD;  Location: [facility] INVASIVE CV LAB;  Service: Cardiovascular;  Laterality: Right;   PERIPHERAL VASCULAR THROMBECTOMY N/A 11/17/2023   Procedure: PERIPHERAL VASCULAR THROMBECTOMY;  Surgeon: Young Hensen, MD;  Location: [facility] INVASIVE CV LAB;  Service: Vascular;  Laterality: N/A;   TOTAL HIP ARTHROPLASTY     right pin then replacement   UPPER GASTROINTESTINAL ENDOSCOPY      Short Social History:  Social History   Tobacco Use   Smoking status: Never   Smokeless tobacco: Never  Substance Use Topics   Alcohol  use: No    Allergies  Allergen Reactions   Lovastatin Itching and  Rash   Nitrofurantoin Rash    Current Outpatient Medications  Medication Sig Dispense Refill   acetaminophen  (TYLENOL ) 500 MG tablet Take 1,000 mg by mouth every 6 (six) hours as needed for moderate pain.     Alcohol  Swabs (B-D SINGLE USE SWABS REGULAR) PADS      amLODipine  (NORVASC ) 10 MG tablet Take 10 mg by mouth daily.     apixaban  (ELIQUIS ) 5 MG TABS tablet Take 1 tablet (5 mg total) by mouth 2 (two) times daily. 180 tablet 1   aspirin  81 MG chewable tablet Chew 81 mg by mouth daily.     Blood Glucose Monitoring Suppl (TRUE METRIX AIR GLUCOSE METER) w/Device KIT      cilostazol  (PLETAL ) 50 MG tablet Take 1 tablet (50 mg total) by mouth 2 (two) times daily. 180 tablet 3   ezetimibe  (ZETIA ) 10 MG tablet Take 1 tablet (10 mg total) by mouth every evening. 90 tablet 3   gabapentin  (NEURONTIN ) 100 MG capsule Take 1 capsule (100 mg total) by mouth 3 (three) times daily. 90 capsule 3   ibuprofen  (ADVIL ) 200 MG tablet Take 400 mg by mouth daily as needed for moderate pain.     insulin  aspart (NOVOLOG ) 100 UNIT/ML FlexPen Inject 10 Units into the skin 3 (three) times daily as needed for high blood sugar. Takes Novolog  10 units with meals (also takes a few extra units if glucose is elevated)     insulin  glargine (LANTUS ) 100 UNIT/ML injection Inject 19 Units into the skin daily.     Insulin  Syringe-Needle U-100 31G X 5/16" 0.5 ML MISC Use one syringe twice daily DX:  250.70     KLOR-CON  M20 20 MEQ tablet Take 20 mEq by mouth daily.  0   losartan -hydrochlorothiazide  (HYZAAR) 50-12.5 MG tablet Take 1 tablet by mouth every morning.     omeprazole (PRILOSEC) 20 MG capsule Take 20 mg by mouth daily as needed (acid reflux).     rosuvastatin  (CRESTOR ) 20 MG tablet TAKE 1 TABLET EVERY DAY (DISCONTINUE PRAVASTATIN ) 90 tablet 0   traMADol  (ULTRAM ) 50 MG tablet Take 1 tablet (50 mg total) by mouth every 6 (six) hours as needed. 30 tablet 1   TRUE METRIX BLOOD GLUCOSE TEST test strip      TRUEplus Lancets  33G MISC      No current facility-administered medications for this visit.    REVIEW OF SYSTEMS  All other systems were reviewed and are negative     Objective:  Objective   There were no vitals filed for this visit. There is no height or weight on file to calculate BMI.  Physical Exam General: no acute distress Cardiac: hemodynamically stable Pulm: normal work of breathing Neuro: alert, no focal deficit Extremities: Right lower leg wound  with increased granulation and is more superficial although there is buildup of fibrinous exudate and the wound edges are black and necrotic.  No purulence expressed, no surrounding erythema      Assessment/Plan:   Catherine Ramsey is a 78 y.o. female with CL TI who underwent a right femoral to peroneal artery bypass with PTFE and vein cuff on 11/22/2023.  This was complicated by lower leg incision dehiscence. The wound has become more superficial with granulation tissue at the base although there is a fibrinous buildup and the wound edges have become necrotic.  She has not had fevers or chills or significant drainage and I do not believe there is underlying infection although I do think that she requires a wound debridement with possible wound VAC placement. Will plan to perform this on Tuesday and I explained I would like her to stay 1 night for further evaluation and potentially obtain a CT to rule out any deeper infection.   Philipp Brawn MD [facility]

## 2024-01-05 NOTE — Telephone Encounter (Signed)
F/u app't required.

## 2024-01-06 ENCOUNTER — Other Ambulatory Visit: Payer: Self-pay

## 2024-01-06 ENCOUNTER — Ambulatory Visit: Attending: Vascular Surgery | Admitting: Vascular Surgery

## 2024-01-06 ENCOUNTER — Encounter: Payer: Self-pay | Admitting: Vascular Surgery

## 2024-01-06 VITALS — BP 145/72 | HR 82 | Temp 97.6°F | Ht 65.0 in | Wt 176.8 lb

## 2024-01-06 DIAGNOSIS — I70221 Atherosclerosis of native arteries of extremities with rest pain, right leg: Secondary | ICD-10-CM

## 2024-01-06 DIAGNOSIS — T8142XA Infection following a procedure, deep incisional surgical site, initial encounter: Secondary | ICD-10-CM

## 2024-01-06 DIAGNOSIS — I739 Peripheral vascular disease, unspecified: Secondary | ICD-10-CM

## 2024-01-06 DIAGNOSIS — S81801A Unspecified open wound, right lower leg, initial encounter: Secondary | ICD-10-CM

## 2024-01-09 ENCOUNTER — Encounter (HOSPITAL_COMMUNITY): Payer: Self-pay | Admitting: Vascular Surgery

## 2024-01-09 ENCOUNTER — Other Ambulatory Visit: Payer: Self-pay

## 2024-01-09 NOTE — Progress Notes (Signed)
 SDW call  Patient was given pre-op instructions over the phone. Patient verbalized understanding of instructions provided.     PCP - Dr. Eilene Grater Cardiologist - Dr. Gerrianne Krauss Pulmonary:    PPM/ICD - denies Device Orders - na Rep Notified - na   Chest x-ray - 11/26/2023 EKG -  DOS, 01/10/2024 Stress Test - 4/8/20224 ECHO - 10/27/2022 Cardiac Cath -   Sleep Study/sleep apnea/CPAP: denies  Type II diabetic. A1C 9.8 11/16/2023 Fasting Blood sugar range: 140-150 How often check sugars: daily Lantus , 10 units day of surgery which is 50% of your regular dose Novolog , zero units dos. Use 1/2 sliding scale for blood sugar > 220   Blood Thinner Instructions: Eliquis , hold two days, states last dose 01/08/2024 Aspirin  Instructions:continue   ERAS Protcol - NPO   Anesthesia review: Yes.  AKI, HTN, PVD, DM, HLD, admit 4/15-4/28   Patient denies shortness of breath, fever, cough and chest pain over the phone call  Your procedure is scheduled on Tuesday January 10, 2024  Report to [facility] Main Entrance "A" at  0530  A.M., then check in with the Admitting office.  Call this number if you have problems the morning of surgery:  [phone]   If you have any questions prior to your surgery date call [phone]: Open Monday-Friday 8am-4pm If you experience any cold or flu symptoms such as cough, fever, chills, shortness of breath, etc. between now and your scheduled surgery, please notify us  at the above number    Remember:  Do not eat or drink  after midnight the night before your surgery  Take these medicines the morning of surgery with A SIP OF WATER:  ASA, pletal , gabapenin, rosuvastatin   As needed: Tylenol , prilosec  As of today, STOP taking any Aleve , Naproxen , Ibuprofen , Motrin , Advil , Goody's, BC's, all herbal medications, fish oil, and all vitamins.

## 2024-01-09 NOTE — Anesthesia Preprocedure Evaluation (Signed)
 Anesthesia Evaluation  Patient identified by MRN, date of birth, ID band Patient awake    Reviewed: Allergy & Precautions, NPO status , Patient's Chart, lab work & pertinent test results  Airway Mallampati: I  TM Distance: >3 FB Neck ROM: Full    Dental  (+) Edentulous Upper, Chipped, Missing, Dental Advisory Given,    Pulmonary neg pulmonary ROS   Pulmonary exam normal breath sounds clear to auscultation       Cardiovascular hypertension, Pt. on medications + Peripheral Vascular Disease  Normal cardiovascular exam Rhythm:Regular Rate:Normal  TTE 2020  1. The left ventricle has normal systolic function with an ejection  fraction of 60-65%. The cavity size was normal. Left ventricular diastolic  Doppler parameters are consistent with impaired relaxation.   2. The right ventricle has normal systolic function. The cavity was  normal. There is no increase in right ventricular wall thickness.   3. Left atrial size was moderately dilated.     Neuro/Psych negative neurological ROS  negative psych ROS   GI/Hepatic Neg liver ROS,GERD  ,,Achalasia - Type II   Endo/Other  diabetes, Type 2, Insulin  Dependent    Renal/GU Renal InsufficiencyRenal disease  negative genitourinary   Musculoskeletal  (+) Arthritis ,    Abdominal   Peds  Hematology  (+) Blood dyscrasia, anemia   Anesthesia Other Findings   Reproductive/Obstetrics                              Anesthesia Physical Anesthesia Plan  ASA: 3  Anesthesia Plan: General   Post-op Pain Management: Tylenol  PO (pre-op)*   Induction: Intravenous  PONV Risk Score and Plan: 3 and Dexamethasone , Ondansetron  and Treatment may vary due to age or medical condition  Airway Management Planned: LMA  Additional Equipment: None  Intra-op Plan:   Post-operative Plan: Extubation in OR  Informed Consent: I have reviewed the patients History and  Physical, chart, labs and discussed the procedure including the risks, benefits and alternatives for the proposed anesthesia with the patient or authorized representative who has indicated his/her understanding and acceptance.     Dental advisory given  Plan Discussed with: CRNA  Anesthesia Plan Comments:         Anesthesia Quick Evaluation

## 2024-01-10 ENCOUNTER — Ambulatory Visit (HOSPITAL_COMMUNITY): Payer: Self-pay

## 2024-01-10 ENCOUNTER — Observation Stay (HOSPITAL_COMMUNITY)
Admission: RE | Admit: 2024-01-10 | Discharge: 2024-01-13 | Disposition: A | Discharge status: Home / Self Care | Attending: Vascular Surgery | Admitting: Vascular Surgery

## 2024-01-10 ENCOUNTER — Encounter (HOSPITAL_COMMUNITY)
Admission: RE | Disposition: A | Discharge status: Home / Self Care | Payer: Self-pay | Source: Home / Self Care | Attending: Vascular Surgery

## 2024-01-10 ENCOUNTER — Observation Stay (HOSPITAL_COMMUNITY)

## 2024-01-10 ENCOUNTER — Other Ambulatory Visit: Payer: Self-pay

## 2024-01-10 DIAGNOSIS — Z794 Long term (current) use of insulin: Secondary | ICD-10-CM | POA: Diagnosis not present

## 2024-01-10 DIAGNOSIS — X58XXXA Exposure to other specified factors, initial encounter: Secondary | ICD-10-CM | POA: Diagnosis not present

## 2024-01-10 DIAGNOSIS — T8131XA Disruption of external operation (surgical) wound, not elsewhere classified, initial encounter: Secondary | ICD-10-CM

## 2024-01-10 DIAGNOSIS — E1122 Type 2 diabetes mellitus with diabetic chronic kidney disease: Secondary | ICD-10-CM

## 2024-01-10 DIAGNOSIS — E119 Type 2 diabetes mellitus without complications: Secondary | ICD-10-CM | POA: Diagnosis not present

## 2024-01-10 DIAGNOSIS — Z7901 Long term (current) use of anticoagulants: Secondary | ICD-10-CM | POA: Insufficient documentation

## 2024-01-10 DIAGNOSIS — Z7982 Long term (current) use of aspirin: Secondary | ICD-10-CM | POA: Insufficient documentation

## 2024-01-10 DIAGNOSIS — E1151 Type 2 diabetes mellitus with diabetic peripheral angiopathy without gangrene: Secondary | ICD-10-CM | POA: Diagnosis not present

## 2024-01-10 DIAGNOSIS — Z8616 Personal history of COVID-19: Secondary | ICD-10-CM | POA: Insufficient documentation

## 2024-01-10 DIAGNOSIS — Y848 Other medical procedures as the cause of abnormal reaction of the patient, or of later complication, without mention of misadventure at the time of the procedure: Secondary | ICD-10-CM | POA: Insufficient documentation

## 2024-01-10 DIAGNOSIS — N1831 Chronic kidney disease, stage 3a: Secondary | ICD-10-CM

## 2024-01-10 DIAGNOSIS — L7682 Other postprocedural complications of skin and subcutaneous tissue: Secondary | ICD-10-CM

## 2024-01-10 DIAGNOSIS — I10 Essential (primary) hypertension: Secondary | ICD-10-CM | POA: Insufficient documentation

## 2024-01-10 DIAGNOSIS — Z9889 Other specified postprocedural states: Secondary | ICD-10-CM | POA: Diagnosis not present

## 2024-01-10 DIAGNOSIS — K449 Diaphragmatic hernia without obstruction or gangrene: Secondary | ICD-10-CM | POA: Diagnosis not present

## 2024-01-10 DIAGNOSIS — I739 Peripheral vascular disease, unspecified: Secondary | ICD-10-CM | POA: Diagnosis not present

## 2024-01-10 DIAGNOSIS — I70202 Unspecified atherosclerosis of native arteries of extremities, left leg: Secondary | ICD-10-CM | POA: Diagnosis not present

## 2024-01-10 DIAGNOSIS — I129 Hypertensive chronic kidney disease with stage 1 through stage 4 chronic kidney disease, or unspecified chronic kidney disease: Secondary | ICD-10-CM | POA: Diagnosis not present

## 2024-01-10 DIAGNOSIS — I701 Atherosclerosis of renal artery: Secondary | ICD-10-CM | POA: Diagnosis not present

## 2024-01-10 DIAGNOSIS — Z96641 Presence of right artificial hip joint: Secondary | ICD-10-CM | POA: Diagnosis not present

## 2024-01-10 DIAGNOSIS — S81801A Unspecified open wound, right lower leg, initial encounter: Secondary | ICD-10-CM | POA: Diagnosis not present

## 2024-01-10 DIAGNOSIS — T8189XA Other complications of procedures, not elsewhere classified, initial encounter: Secondary | ICD-10-CM | POA: Diagnosis not present

## 2024-01-10 DIAGNOSIS — T8142XA Infection following a procedure, deep incisional surgical site, initial encounter: Secondary | ICD-10-CM | POA: Insufficient documentation

## 2024-01-10 DIAGNOSIS — Z79899 Other long term (current) drug therapy: Secondary | ICD-10-CM | POA: Insufficient documentation

## 2024-01-10 LAB — GLUCOSE, CAPILLARY
Glucose-Capillary: 158 mg/dL — ABNORMAL HIGH (ref 70–99)
Glucose-Capillary: 192 mg/dL — ABNORMAL HIGH (ref 70–99)

## 2024-01-10 LAB — POCT I-STAT, CHEM 8
BUN: 13 mg/dL (ref 8–23)
Calcium, Ion: 1.21 mmol/L (ref 1.15–1.40)
Chloride: 106 mmol/L (ref 98–111)
Creatinine, Ser: 1 mg/dL (ref 0.44–1.00)
Glucose, Bld: 155 mg/dL — ABNORMAL HIGH (ref 70–99)
HCT: 33 % — ABNORMAL LOW (ref 36.0–46.0)
Hemoglobin: 11.2 g/dL — ABNORMAL LOW (ref 12.0–15.0)
Potassium: 4.5 mmol/L (ref 3.5–5.1)
Sodium: 142 mmol/L (ref 135–145)
TCO2: 24 mmol/L (ref 22–32)

## 2024-01-10 LAB — CBC
HCT: 32.2 % — ABNORMAL LOW (ref 36.0–46.0)
Hemoglobin: 9.8 g/dL — ABNORMAL LOW (ref 12.0–15.0)
MCH: 26.9 pg (ref 26.0–34.0)
MCHC: 30.4 g/dL (ref 30.0–36.0)
MCV: 88.5 fL (ref 80.0–100.0)
Platelets: 281 10*3/uL (ref 150–400)
RBC: 3.64 MIL/uL — ABNORMAL LOW (ref 3.87–5.11)
RDW: 14.6 % (ref 11.5–15.5)
WBC: 8.5 10*3/uL (ref 4.0–10.5)
nRBC: 0 % (ref 0.0–0.2)

## 2024-01-10 SURGERY — IRRIGATION AND DEBRIDEMENT WOUND
Anesthesia: General | Site: Leg Lower | Laterality: Right

## 2024-01-10 MED ORDER — ACETAMINOPHEN 325 MG PO TABS
325.0000 mg | ORAL_TABLET | ORAL | Status: DC | PRN
  Administered 2024-01-12: 325 mg via ORAL
  Administered 2024-01-12 – 2024-01-13 (×3): 650 mg via ORAL
  Filled 2024-01-10: qty 2
  Filled 2024-01-10 (×2): qty 1
  Filled 2024-01-10: qty 2

## 2024-01-10 MED ORDER — LOSARTAN POTASSIUM-HCTZ 50-12.5 MG PO TABS: 1.0000 | ORAL_TABLET | ORAL | Status: DC

## 2024-01-10 MED ORDER — HEPARIN SODIUM (PORCINE) 5000 UNIT/ML IJ SOLN
5000.0000 [IU] | Freq: Three times a day (TID) | INTRAMUSCULAR | Status: DC
  Administered 2024-01-10 – 2024-01-11 (×3): 5000 [IU] via SUBCUTANEOUS
  Filled 2024-01-10 (×3): qty 1

## 2024-01-10 MED ORDER — DEXAMETHASONE SODIUM PHOSPHATE 10 MG/ML IJ SOLN
INTRAMUSCULAR | Status: AC
Start: 2024-01-10 — End: ?
  Filled 2024-01-10: qty 1

## 2024-01-10 MED ORDER — ONDANSETRON HCL 4 MG/2ML IJ SOLN
INTRAMUSCULAR | Status: AC
Start: 2024-01-10 — End: ?
  Filled 2024-01-10: qty 2

## 2024-01-10 MED ORDER — PROPOFOL 10 MG/ML IV BOLUS
INTRAVENOUS | Status: DC | PRN
  Administered 2024-01-10: 140 mg via INTRAVENOUS

## 2024-01-10 MED ORDER — LACTATED RINGERS IV SOLN: INTRAVENOUS | Status: DC

## 2024-01-10 MED ORDER — ACETAMINOPHEN 500 MG PO TABS: 1000.0000 mg | ORAL_TABLET | Freq: Once | ORAL | Status: DC

## 2024-01-10 MED ORDER — HYDRALAZINE HCL 20 MG/ML IJ SOLN: 5.0000 mg | INTRAMUSCULAR | Status: DC | PRN

## 2024-01-10 MED ORDER — IOHEXOL 350 MG/ML SOLN
125.0000 mL | Freq: Once | INTRAVENOUS | Status: AC | PRN
  Administered 2024-01-10: 125 mL via INTRAVENOUS

## 2024-01-10 MED ORDER — FENTANYL CITRATE (PF) 250 MCG/5ML IJ SOLN
INTRAMUSCULAR | Status: DC | PRN
  Administered 2024-01-10 (×2): 25 ug via INTRAVENOUS
  Administered 2024-01-10 (×4): 50 ug via INTRAVENOUS

## 2024-01-10 MED ORDER — FENTANYL CITRATE (PF) 250 MCG/5ML IJ SOLN
INTRAMUSCULAR | Status: AC
  Filled 2024-01-10: qty 5

## 2024-01-10 MED ORDER — DROPERIDOL 2.5 MG/ML IJ SOLN: 0.6250 mg | Freq: Once | INTRAMUSCULAR | Status: DC | PRN

## 2024-01-10 MED ORDER — GABAPENTIN 100 MG PO CAPS
100.0000 mg | ORAL_CAPSULE | Freq: Three times a day (TID) | ORAL | Status: DC
  Administered 2024-01-10 – 2024-01-13 (×11): 100 mg via ORAL
  Filled 2024-01-10 (×11): qty 1

## 2024-01-10 MED ORDER — SODIUM CHLORIDE 0.9 % IV SOLN: INTRAVENOUS | Status: DC

## 2024-01-10 MED ORDER — CHLORHEXIDINE GLUCONATE 4 % EX SOLN: 60.0000 mL | Freq: Once | CUTANEOUS | Status: DC

## 2024-01-10 MED ORDER — PROPOFOL 10 MG/ML IV BOLUS
INTRAVENOUS | Status: AC
  Filled 2024-01-10: qty 20

## 2024-01-10 MED ORDER — PROPOFOL 10 MG/ML IV BOLUS
INTRAVENOUS | Status: AC
Start: 2024-01-10 — End: ?
  Filled 2024-01-10: qty 20

## 2024-01-10 MED ORDER — 0.9 % SODIUM CHLORIDE (POUR BTL) OPTIME
TOPICAL | Status: DC | PRN
  Administered 2024-01-10: 1000 mL

## 2024-01-10 MED ORDER — LABETALOL HCL 5 MG/ML IV SOLN: 10.0000 mg | INTRAVENOUS | Status: DC | PRN

## 2024-01-10 MED ORDER — HYDROCHLOROTHIAZIDE 12.5 MG PO TABS
12.5000 mg | ORAL_TABLET | Freq: Every day | ORAL | Status: DC
  Administered 2024-01-10 – 2024-01-13 (×4): 12.5 mg via ORAL
  Filled 2024-01-10 (×4): qty 1

## 2024-01-10 MED ORDER — DEXAMETHASONE SODIUM PHOSPHATE 10 MG/ML IJ SOLN
INTRAMUSCULAR | Status: DC | PRN
  Administered 2024-01-10: 4 mg via INTRAVENOUS

## 2024-01-10 MED ORDER — HYDROMORPHONE HCL 1 MG/ML IJ SOLN
0.5000 mg | INTRAMUSCULAR | Status: DC | PRN
  Administered 2024-01-10: 1 mg via INTRAVENOUS
  Filled 2024-01-10: qty 1

## 2024-01-10 MED ORDER — ONDANSETRON HCL 4 MG/2ML IJ SOLN: 4.0000 mg | Freq: Four times a day (QID) | INTRAMUSCULAR | Status: DC | PRN

## 2024-01-10 MED ORDER — LABETALOL HCL 5 MG/ML IV SOLN
10.0000 mg | Freq: Once | INTRAVENOUS | Status: AC
  Administered 2024-01-10: 10 mg via INTRAVENOUS

## 2024-01-10 MED ORDER — AMLODIPINE BESYLATE 10 MG PO TABS
10.0000 mg | ORAL_TABLET | Freq: Every day | ORAL | Status: DC
  Administered 2024-01-10 – 2024-01-13 (×4): 10 mg via ORAL
  Filled 2024-01-10 (×4): qty 1

## 2024-01-10 MED ORDER — LIDOCAINE 2% (20 MG/ML) 5 ML SYRINGE
INTRAMUSCULAR | Status: AC
  Filled 2024-01-10: qty 5

## 2024-01-10 MED ORDER — OXYCODONE HCL 5 MG PO TABS: 5.0000 mg | ORAL_TABLET | Freq: Once | ORAL | Status: DC | PRN

## 2024-01-10 MED ORDER — OXYCODONE HCL 5 MG PO TABS
5.0000 mg | ORAL_TABLET | ORAL | Status: DC | PRN
  Administered 2024-01-11 – 2024-01-12 (×3): 10 mg via ORAL
  Administered 2024-01-12 (×2): 5 mg via ORAL
  Administered 2024-01-13: 10 mg via ORAL
  Filled 2024-01-10 (×2): qty 2
  Filled 2024-01-10: qty 1
  Filled 2024-01-10: qty 2
  Filled 2024-01-10: qty 1
  Filled 2024-01-10: qty 2

## 2024-01-10 MED ORDER — DOCUSATE SODIUM 100 MG PO CAPS
100.0000 mg | ORAL_CAPSULE | Freq: Two times a day (BID) | ORAL | Status: DC
  Administered 2024-01-10 – 2024-01-13 (×7): 100 mg via ORAL
  Filled 2024-01-10 (×7): qty 1

## 2024-01-10 MED ORDER — ACETAMINOPHEN 325 MG RE SUPP: 325.0000 mg | RECTAL | Status: DC | PRN

## 2024-01-10 MED ORDER — PANTOPRAZOLE SODIUM 40 MG PO TBEC: 40.0000 mg | DELAYED_RELEASE_TABLET | Freq: Every day | ORAL | Status: DC

## 2024-01-10 MED ORDER — LABETALOL HCL 5 MG/ML IV SOLN
INTRAVENOUS | Status: AC
  Filled 2024-01-10: qty 4

## 2024-01-10 MED ORDER — ROSUVASTATIN CALCIUM 20 MG PO TABS
20.0000 mg | ORAL_TABLET | Freq: Every day | ORAL | Status: DC
  Administered 2024-01-10 – 2024-01-12 (×3): 20 mg via ORAL
  Filled 2024-01-10 (×3): qty 1

## 2024-01-10 MED ORDER — PHENOL 1.4 % MT LIQD: 1.0000 | OROMUCOSAL | Status: DC | PRN

## 2024-01-10 MED ORDER — INSULIN ASPART 100 UNIT/ML IJ SOLN: 0.0000 [IU] | INTRAMUSCULAR | Status: DC | PRN

## 2024-01-10 MED ORDER — ASPIRIN 81 MG PO CHEW
81.0000 mg | CHEWABLE_TABLET | Freq: Every day | ORAL | Status: DC
  Administered 2024-01-10 – 2024-01-13 (×4): 81 mg via ORAL
  Filled 2024-01-10 (×4): qty 1

## 2024-01-10 MED ORDER — OXYCODONE HCL 5 MG/5ML PO SOLN: 5.0000 mg | Freq: Once | ORAL | Status: DC | PRN

## 2024-01-10 MED ORDER — GUAIFENESIN-DM 100-10 MG/5ML PO SYRP
15.0000 mL | ORAL_SOLUTION | ORAL | Status: AC | PRN
Start: 2024-01-10 — End: ?

## 2024-01-10 MED ORDER — ORAL CARE MOUTH RINSE: 15.0000 mL | Freq: Once | OROMUCOSAL | Status: AC

## 2024-01-10 MED ORDER — ONDANSETRON HCL 4 MG/2ML IJ SOLN
INTRAMUSCULAR | Status: DC | PRN
  Administered 2024-01-10: 4 mg via INTRAVENOUS

## 2024-01-10 MED ORDER — FENTANYL CITRATE (PF) 100 MCG/2ML IJ SOLN
25.0000 ug | INTRAMUSCULAR | Status: DC | PRN
  Administered 2024-01-10 (×2): 25 ug via INTRAVENOUS
  Administered 2024-01-10 (×2): 50 ug via INTRAVENOUS

## 2024-01-10 MED ORDER — CHLORHEXIDINE GLUCONATE 0.12 % MT SOLN
15.0000 mL | Freq: Once | OROMUCOSAL | Status: AC
  Administered 2024-01-10: 15 mL via OROMUCOSAL

## 2024-01-10 MED ORDER — PANTOPRAZOLE SODIUM 40 MG PO TBEC
40.0000 mg | DELAYED_RELEASE_TABLET | Freq: Every day | ORAL | Status: DC
  Administered 2024-01-10 – 2024-01-13 (×4): 40 mg via ORAL
  Filled 2024-01-10 (×4): qty 1

## 2024-01-10 MED ORDER — CEFAZOLIN SODIUM-DEXTROSE 2-4 GM/100ML-% IV SOLN
2.0000 g | INTRAVENOUS | Status: AC
  Administered 2024-01-10: 2 g via INTRAVENOUS

## 2024-01-10 MED ORDER — METOPROLOL TARTRATE 5 MG/5ML IV SOLN: 2.0000 mg | INTRAVENOUS | Status: DC | PRN

## 2024-01-10 MED ORDER — ACETAMINOPHEN 10 MG/ML IV SOLN
1000.0000 mg | Freq: Once | INTRAVENOUS | Status: DC | PRN
Start: 2024-01-10 — End: 2024-01-10

## 2024-01-10 MED ORDER — FENTANYL CITRATE (PF) 100 MCG/2ML IJ SOLN
INTRAMUSCULAR | Status: AC
Start: 2024-01-10 — End: ?
  Filled 2024-01-10: qty 2

## 2024-01-10 MED ORDER — POLYETHYLENE GLYCOL 3350 17 G PO PACK: 17.0000 g | PACK | Freq: Every day | ORAL | Status: DC | PRN

## 2024-01-10 MED ORDER — ALUM & MAG HYDROXIDE-SIMETH 200-200-20 MG/5ML PO SUSP: 15.0000 mL | ORAL | Status: DC | PRN

## 2024-01-10 MED ORDER — ROSUVASTATIN CALCIUM 20 MG PO TABS: 20.0000 mg | ORAL_TABLET | Freq: Every day | ORAL | Status: DC

## 2024-01-10 MED ORDER — LOSARTAN POTASSIUM 50 MG PO TABS
50.0000 mg | ORAL_TABLET | Freq: Every day | ORAL | Status: DC
  Administered 2024-01-10 – 2024-01-13 (×4): 50 mg via ORAL
  Filled 2024-01-10 (×4): qty 1

## 2024-01-10 MED ORDER — POTASSIUM CHLORIDE CRYS ER 20 MEQ PO TBCR: 20.0000 meq | EXTENDED_RELEASE_TABLET | Freq: Once | ORAL | Status: DC

## 2024-01-10 MED ORDER — SENNA 8.6 MG PO TABS
1.0000 | ORAL_TABLET | Freq: Two times a day (BID) | ORAL | Status: DC
  Administered 2024-01-10 – 2024-01-13 (×6): 8.6 mg via ORAL
  Filled 2024-01-10 (×7): qty 1

## 2024-01-10 MED ORDER — FENTANYL CITRATE (PF) 100 MCG/2ML IJ SOLN
INTRAMUSCULAR | Status: AC
  Filled 2024-01-10: qty 2

## 2024-01-10 MED ORDER — VASHE WOUND IRRIGATION OPTIME
TOPICAL | Status: DC | PRN
  Administered 2024-01-10: 34 [oz_av]

## 2024-01-10 MED ORDER — LIDOCAINE 2% (20 MG/ML) 5 ML SYRINGE
INTRAMUSCULAR | Status: DC | PRN
  Administered 2024-01-10: 80 mg via INTRAVENOUS

## 2024-01-10 SURGICAL SUPPLY — 40 items
BAG COUNTER SPONGE SURGICOUNT (BAG) ×3 IMPLANT
BNDG ELASTIC 4X5.8 VLCR STR LF (GAUZE/BANDAGES/DRESSINGS) IMPLANT
BNDG ELASTIC 6INX 5YD STR LF (GAUZE/BANDAGES/DRESSINGS) IMPLANT
BNDG GAUZE DERMACEA FLUFF 4 (GAUZE/BANDAGES/DRESSINGS) IMPLANT
CANISTER SUCTION 3000ML PPV (SUCTIONS) ×6 IMPLANT
CANISTER WOUNDNEG PRESSURE 500 (CANNISTER) IMPLANT
CLEANSER WND VASHE 34 (WOUND CARE) IMPLANT
CLIP TI MEDIUM 6 (CLIP) ×3 IMPLANT
CLIP TI WIDE RED SMALL 6 (CLIP) ×3 IMPLANT
COVER SURGICAL LIGHT HANDLE (MISCELLANEOUS) ×3 IMPLANT
DRAPE HALF SHEET 40X57 (DRAPES) IMPLANT
DRAPE U-SHAPE 76X120 STRL (DRAPES) IMPLANT
DRSG VAC GRANUFOAM LG (GAUZE/BANDAGES/DRESSINGS) IMPLANT
DRSG VAC GRANUFOAM MED (GAUZE/BANDAGES/DRESSINGS) IMPLANT
DRSG VERAFLO VAC MED (GAUZE/BANDAGES/DRESSINGS) IMPLANT
ELECTRODE REM PT RTRN 9FT ADLT (ELECTROSURGICAL) ×3 IMPLANT
GAUZE SPONGE 4X4 12PLY STRL (GAUZE/BANDAGES/DRESSINGS) ×3 IMPLANT
GAUZE XEROFORM 5X9 LF (GAUZE/BANDAGES/DRESSINGS) IMPLANT
GLOVE BIOGEL PI IND STRL 7.0 (GLOVE) ×3 IMPLANT
GOWN STRL REUS W/ TWL LRG LVL3 (GOWN DISPOSABLE) ×9 IMPLANT
GOWN STRL REUS W/ TWL XL LVL3 (GOWN DISPOSABLE) ×3 IMPLANT
GRAFT SKIN WND MICRO 38 (Tissue) IMPLANT
IV NS IRRIG 3000ML ARTHROMATIC (IV SOLUTION) ×3 IMPLANT
KIT BASIN OR (CUSTOM PROCEDURE TRAY) ×3 IMPLANT
KIT TURNOVER KIT B (KITS) ×3 IMPLANT
NS IRRIG 1000ML POUR BTL (IV SOLUTION) ×3 IMPLANT
PACK CV ACCESS (CUSTOM PROCEDURE TRAY) IMPLANT
PACK GENERAL/GYN (CUSTOM PROCEDURE TRAY) ×3 IMPLANT
PACK UNIVERSAL I (CUSTOM PROCEDURE TRAY) ×3 IMPLANT
PAD ARMBOARD POSITIONER FOAM (MISCELLANEOUS) ×6 IMPLANT
PAD NEG PRESSURE SENSATRAC (MISCELLANEOUS) ×3 IMPLANT
POWDER SURGICEL 3.0 GRAM (HEMOSTASIS) IMPLANT
SET HNDPC FAN SPRY TIP SCT (DISPOSABLE) IMPLANT
SUT ETHILON 3 0 PS 1 (SUTURE) IMPLANT
SUT VIC AB 2-0 CTX 36 (SUTURE) IMPLANT
SUT VIC AB 3-0 SH 27X BRD (SUTURE) IMPLANT
SUT VIC AB 4-0 PS2 18 (SUTURE) IMPLANT
TIP FAN IRRIG PULSAVAC PLUS (DISPOSABLE) IMPLANT
TOWEL GREEN STERILE (TOWEL DISPOSABLE) ×3 IMPLANT
WATER STERILE IRR 1000ML POUR (IV SOLUTION) ×3 IMPLANT

## 2024-01-10 NOTE — TOC Initial Note (Signed)
 Transition of Care (TOC) - Initial/Assessment Note   Spoke to patient and her husband at bedside.   Patient from home with husband.   Has transportation to appointments.   Patient has walker and wheelchair at home already.   Patient was active with [facility] for HHPT. Patient wants to continue services with [facility]. Amy with [facility] aware now has a VAC and can accept. Needs orders for [facility], and PT. PA aware . Instructions for VAC at home: 2x/ week starting next Thursday or Friday. We will do the first one in the office on Monday or Tuesday . NCM added HHPT and VAC instructions to order and asked PA to co sign.  Sherrlyn Dolores with [employer] aware home VAC is needed and will email PA prescription to sign.   Once insurance approves home VAC, NCM will take home VAC to bedside. On day of discharge, hospital nurse will disconnect hospital pump and contact home pump.  Home pump much smaller and lighter then hospital pump . Also patient will have some dressing supplies to take home with her for her HHRN. [employer] will ship more supplies to her address. Patient and husband voiced understanding    Patient Details  Name: Catherine Ramsey MRN: [id number] Date of Birth: 06-28-1946  Transition of Care [facility]) CM/SW Contact:    Terre Ferri, RN Phone Number: 01/10/2024, 1:13 PM  Clinical Narrative:                   Expected Discharge Plan: Home w Home Health Services Barriers to Discharge: Continued Medical Work up   Patient Goals and CMS Choice Patient states their goals for this hospitalization and ongoing recovery are:: to return to home CMS Medicare.gov Compare Post Acute Care list provided to:: Patient Choice offered to / list presented to : Patient      Expected Discharge Plan and Services   Discharge Planning Services: CM Consult Post Acute Care Choice: Home Health Living arrangements for the past 2 months: Single Family Home                 DME Arranged: Vac DME Agency:  [employer] Date DME Agency Contacted: 01/10/24 Time DME Agency Contacted: 1311 Representative spoke with at DME Agency: Sherrlyn Dolores HH Arranged: RN, PT [facility] Agency: [facility] Date [facility] Agency Contacted: 01/10/24 Time HH Agency Contacted: 1312 Representative spoke with at [facility] Agency: Amy  Prior Living Arrangements/Services Living arrangements for the past 2 months: Single Family Home Lives with:: Spouse Patient language and need for interpreter reviewed:: Yes Do you feel safe going back to the place where you live?: Yes      Need for Family Participation in Patient Care: Yes (Comment) Care giver support system in place?: Yes (comment) Current home services: DME Criminal Activity/Legal Involvement Pertinent to Current Situation/Hospitalization: No - Comment as needed  Activities of Daily Living   ADL Screening (condition at time of admission) Independently performs ADLs?: Yes (appropriate for developmental age) Is the patient deaf or have difficulty hearing?: No Does the patient have difficulty seeing, even when wearing glasses/contacts?: No Does the patient have difficulty concentrating, remembering, or making decisions?: No  Permission Sought/Granted   Permission granted to share information with : Yes, Verbal Permission Granted  Share Information with NAME: husband Kaylaann Mountz  Permission granted to share info w AGENCY: [phone]        Emotional Assessment Appearance:: Appears stated age Attitude/Demeanor/Rapport: Engaged Affect (typically observed): Appropriate Orientation: : Oriented to Self, Oriented to  Place, Oriented to  Time, Oriented to Situation Alcohol  / Substance Use: Not Applicable Psych Involvement: No (comment)  Admission diagnosis:  Infection of deep incisional surgical site after procedure, initial encounter [T81.42XA] PAD (peripheral artery disease) (HCC) [I73.9] Patient Active Problem List   Diagnosis Date Noted   PAD (peripheral artery disease) (HCC)  01/10/2024   Critical limb ischemia of right lower extremity (HCC) 11/15/2023   Chronic kidney disease, stage 3a (HCC) 11/15/2023   Normocytic anemia 11/15/2023   Paronychia of great toe, left 10/01/2022   Callus 10/01/2022   COVID-19 08/18/2021   Neck pain 08/18/2021   Strain of right trapezius muscle 08/18/2021   Peripheral arterial disease (HCC) 05/05/2021   Hyperlipidemia associated with type 2 diabetes mellitus (HCC) 06/22/2019   Type 2 diabetes mellitus with obesity (HCC) 06/22/2019   Diabetes mellitus due to underlying condition, uncontrolled, with hyperglycemia (HCC)    Hypertension associated with diabetes (HCC) 02/25/2019   Long term (current) use of insulin  (HCC) 11/30/2018   Cellulitis of toe 09/22/2018   Primary localized osteoarthritis of pelvic region and thigh 06/28/2018   Localized edema 04/21/2017   Encounter for general adult medical examination without abnormal findings 04/29/2016   Pain in left knee 07/07/2015   Hyperglycemia due to type 2 diabetes mellitus (HCC) 03/20/2015   Mononeuropathy 03/20/2015   Other specified disorders of synovium and tendon, unspecified site 09/11/2012   Obesity 08/04/2011   Psychophysiologic insomnia 06/08/2010   Insulin  dependent diabetes mellitus 05/08/2010   Achalasia - Type II 05/08/2010   History of colonic polyps 05/08/2010   Low back pain 03/05/2010   Gastro-esophageal reflux disease without esophagitis 12/01/2009   Osteoarthritis 12/01/2009   PCP:  Bertha Broad, MD Pharmacy:   [facility] [phone] Jonette Nestle, [place] - [address] [place] [address] Phone: [phone] Fax: [phone]  [facility] - [place], [place] - [address] [place] [address] Phone: [phone] Fax: [phone]     Social Drivers of Health (SDOH) Social History: SDOH Screenings   Food Insecurity: No Food Insecurity (11/15/2023)  Housing: Low Risk   (11/15/2023)  Transportation Needs: No Transportation Needs (11/15/2023)  Utilities: Not At Risk (11/15/2023)  Social Connections: Socially Integrated (11/15/2023)  Tobacco Use: Low Risk  (01/09/2024)   SDOH Interventions:     Readmission Risk Interventions    11/28/2023   11:08 AM  Readmission Risk Prevention Plan  Post Dischage Appt Complete  Medication Screening Complete  Transportation Screening Complete

## 2024-01-10 NOTE — Progress Notes (Signed)
 [place] RN and I discussed patient's temperature being elevated.  Boyd Cabal, RN made Dr. Elby Green aware.

## 2024-01-10 NOTE — Interval H&P Note (Signed)
 History and Physical Interval Note:  01/10/2024 7:18 AM  Catherine Ramsey  has presented today for surgery, with the diagnosis of POST OP WOUND INFECTION.  The various methods of treatment have been discussed with the patient and family. After consideration of risks, benefits and other options for treatment, the patient has consented to  Procedure(s): IRRIGATION AND DEBRIDEMENT WOUND (Right) APPLICATION, WOUND VAC (Right) as a surgical intervention.  The patient's history has been reviewed, patient examined, no change in status, stable for surgery.  I have reviewed the patient's chart and labs.  Questions were answered to the patient's satisfaction.     Philipp Brawn

## 2024-01-10 NOTE — Anesthesia Procedure Notes (Signed)
 Procedure Name: LMA Insertion Date/Time: 01/10/2024 7:36 AM  Performed by: Candance Certain, CRNAPre-anesthesia Checklist: Patient identified, Emergency Drugs available, Suction available and Patient being monitored Patient Re-evaluated:Patient Re-evaluated prior to induction Oxygen Delivery Method: Circle System Utilized Preoxygenation: Pre-oxygenation with 100% oxygen Induction Type: IV induction Ventilation: Mask ventilation without difficulty LMA: LMA inserted LMA Size: 4.0 Number of attempts: 1 Airway Equipment and Method: Bite block Placement Confirmation: positive ETCO2 Tube secured with: Tape Dental Injury: Teeth and Oropharynx as per pre-operative assessment

## 2024-01-10 NOTE — Transfer of Care (Signed)
 Immediate Anesthesia Transfer of Care Note  Patient: Catherine Ramsey  Procedure(s) Performed: IRRIGATION AND DEBRIDEMENT WOUND (Right) APPLICATION, WOUND VAC (Right) APPLICATION, SKIN SUBSTITUTE KERECIS 38 SQ CM (Right: Leg Lower)  Patient Location: PACU  Anesthesia Type:General  Level of Consciousness: awake, alert , oriented, and patient cooperative  Airway & Oxygen Therapy: Patient Spontanous Breathing and Patient connected to face mask oxygen  Post-op Assessment: Report given to RN and Post -op Vital signs reviewed and stable  Post vital signs: Reviewed and stable  Last Vitals:  Vitals Value Taken Time  BP 162/97 01/10/24 0830  Temp    Pulse 90 01/10/24 0833  Resp 13 01/10/24 0833  SpO2 98 % 01/10/24 0833  Vitals shown include unfiled device data.  Last Pain:  Vitals:   01/10/24 0640  TempSrc:   PainSc: 4       Patients Stated Pain Goal: 2 (01/10/24 0640)  Complications: No notable events documented.

## 2024-01-10 NOTE — Care Management Obs Status (Signed)
 MEDICARE OBSERVATION STATUS NOTIFICATION   Patient Details  Name: Catherine Ramsey MRN: [id number] Date of Birth: 1945-11-08   Medicare Observation Status Notification Given:       Terre Ferri, RN 01/10/2024, 1:06 PM

## 2024-01-10 NOTE — Op Note (Addendum)
    OPERATIVE NOTE  PROCEDURE:   Debridement of right lower leg wound, skin; first 20 sq cm or less Application of tissue substitute. (Kerecis 38g morcellated)  Application of wound vac  PRE-OPERATIVE DIAGNOSIS: Postoperative right lower leg wound  POST-OPERATIVE DIAGNOSIS: same as above   SURGEON: Philipp Brawn MD  ASSISTANT(S): none  ANESTHESIA: general  ESTIMATED BLOOD LOSS: 15 cc  FINDING(S): Fibrinous buildup with necrotic skin edges and subcutaneous fat. No purulence, no undermining or deeper tracks  Healthy bleeding granulation tissue after debridement  Wound measurements: 11.5 x 5.5 x 0.4 cm  SPECIMEN(S):  none  INDICATIONS:   Catherine Ramsey is a 78 y.o. female who underwent right femoral to peroneal artery bypass with PTFE and vein cuff on 11/22/2023 for rest pain.  This was complicated by lower leg incision dehiscence. In the office it was noted that the wound had become more superficial with granulation tissue at the base although there is a fibrinous buildup and the wound edges have become necrotic.  She denied fevers or chills or significant drainage and I do not believe there is underlying infection although I do think that she requires a wound debridement with possible wound VAC placement. Risks and benefits of debridement with possible vac placement were reviewed and she elected to proceed.   DESCRIPTION: The patient was brought to the operating room and positioned supine on the operating table.  General anesthesia was induced and the right leg was prepped and draped in usual sterile fashion.  Timeout was performed and preoperative antibiotics were administered. I started with sharp debridement of the necrotic skin edges.  These were debrided back to healthy bleeding soft tissue.  I then proceeded with sharp debridement of the wound bed removing as much fibrinous buildup as possible.  It should be noted that there was no tracks undermining the skin or any areas  of tracks deeper.  There was also no purulence.  Necrotic subcutaneous fat was sharply debrided with and the wound was then copiously irrigated with Vashe.  Hemostasis was achieved with electrocautery.  38 g of morcellated Kerecis was then applied to the wound bed and a wound VAC was applied. All counts were correct at the end of the procedure, she tolerated the procedure well and was brought to recovery in stable condition.    COMPLICATIONS: none apparent  CONDITION: stable  Philipp Brawn MD [facility] Phone Number: [phone] 01/10/2024 8:35 AM

## 2024-01-10 NOTE — Anesthesia Postprocedure Evaluation (Signed)
 Anesthesia Post Note  Patient: Catherine Ramsey  Procedure(s) Performed: IRRIGATION AND DEBRIDEMENT WOUND (Right) APPLICATION, WOUND VAC (Right) APPLICATION, SKIN SUBSTITUTE KERECIS 38 SQ CM (Right: Leg Lower)     Patient location during evaluation: PACU Anesthesia Type: General Level of consciousness: awake and alert Pain management: pain level controlled Vital Signs Assessment: post-procedure vital signs reviewed and stable Respiratory status: spontaneous breathing, nonlabored ventilation, respiratory function stable and patient connected to nasal cannula oxygen Cardiovascular status: blood pressure returned to baseline and stable Postop Assessment: no apparent nausea or vomiting Anesthetic complications: no   No notable events documented.  Last Vitals:  Vitals:   01/10/24 0945 01/10/24 1008  BP: (!) 176/85 (!) 160/88  Pulse: 75 78  Resp: 13 17  Temp: 36.6 C 36.5 C  SpO2: 98% 98%    Last Pain:  Vitals:   01/10/24 1008  TempSrc: Oral  PainSc:                  [name]

## 2024-01-11 ENCOUNTER — Encounter (HOSPITAL_COMMUNITY): Payer: Self-pay | Admitting: Vascular Surgery

## 2024-01-11 DIAGNOSIS — Z9889 Other specified postprocedural states: Secondary | ICD-10-CM | POA: Diagnosis not present

## 2024-01-11 DIAGNOSIS — T8131XA Disruption of external operation (surgical) wound, not elsewhere classified, initial encounter: Secondary | ICD-10-CM | POA: Diagnosis not present

## 2024-01-11 DIAGNOSIS — L7682 Other postprocedural complications of skin and subcutaneous tissue: Secondary | ICD-10-CM | POA: Diagnosis not present

## 2024-01-11 DIAGNOSIS — S81801A Unspecified open wound, right lower leg, initial encounter: Secondary | ICD-10-CM | POA: Diagnosis not present

## 2024-01-11 MED ORDER — SULFAMETHOXAZOLE-TRIMETHOPRIM 800-160 MG PO TABS
1.0000 | ORAL_TABLET | Freq: Two times a day (BID) | ORAL | Status: DC
  Administered 2024-01-11 – 2024-01-13 (×5): 1 via ORAL
  Filled 2024-01-11 (×5): qty 1

## 2024-01-11 MED ORDER — APIXABAN 5 MG PO TABS
5.0000 mg | ORAL_TABLET | Freq: Two times a day (BID) | ORAL | Status: DC
  Administered 2024-01-11 – 2024-01-13 (×5): 5 mg via ORAL
  Filled 2024-01-11 (×5): qty 1

## 2024-01-11 NOTE — Progress Notes (Addendum)
  Progress Note    01/11/2024 7:30 AM 1 Day Post-Op  Subjective:  No complaints   Vitals:   01/10/24 2212 01/11/24 0417  BP: 130/77 133/68  Pulse: 83 67  Resp: 18 17  Temp: 98.3 F (36.8 C) 97.9 F (36.6 C)  SpO2: 100% 100%   Physical Exam: Lungs:  non labored Incisions:  R calf wound vac with good seal; foot warm; 2nd and 3rd toe gangrene Neurologic: A&O  CBC    Component Value Date/Time   WBC 8.5 01/10/2024 1019   RBC 3.64 (L) 01/10/2024 1019   HGB 9.8 (L) 01/10/2024 1019   HCT 32.2 (L) 01/10/2024 1019   PLT 281 01/10/2024 1019   MCV 88.5 01/10/2024 1019   MCH 26.9 01/10/2024 1019   MCHC 30.4 01/10/2024 1019   RDW 14.6 01/10/2024 1019   LYMPHSABS 2.8 11/28/2023 0349   MONOABS 0.9 11/28/2023 0349   EOSABS 0.3 11/28/2023 0349   BASOSABS 0.0 11/28/2023 0349    BMET    Component Value Date/Time   NA 142 01/10/2024 0622   NA 143 03/26/2019 1403   K 4.5 01/10/2024 0622   CL 106 01/10/2024 0622   CO2 22 11/24/2023 0708   GLUCOSE 155 (H) 01/10/2024 0622   BUN 13 01/10/2024 0622   BUN 11 03/26/2019 1403   CREATININE 1.00 01/10/2024 0622   CALCIUM  8.2 (L) 11/24/2023 0708   GFRNONAA 50 (L) 11/24/2023 0708   GFRAA 56 (L) 03/26/2019 1403    INR    Component Value Date/Time   INR 1.2 11/15/2023 1546     Intake/Output Summary (Last 24 hours) at 01/11/2024 0730 Last data filed at 01/11/2024 0600 Gross per 24 hour  Intake 1000 ml  Output 2895 ml  Net -1895 ml     Assessment/Plan:  78 y.o. female is s/p R calf I&D 1 Day Post-Op   R foot warm and well perfused; dry gangrene 2nd, 3rd toes R calf with good seal on vac TOC consulted for [facility] and home vac Ok for discharge when home vac arranged   Cordie Deters, PA-C [facility] [phone] 01/11/2024 7:30 AM  VASCULAR STAFF ADDENDUM: I have independently interviewed and examined the patient. I agree with the above.  CTA with a small amount of fluid versus worsened edema at the  distal aspect of the bypass No overt signs of infection and no white count given wound and prosthetic bypass will plan for 7 days of p.o. antibiotics. Plan for discharge with home VAC and follow-up early next week for VAC change and wound evaluation Restart Eliquis  today  Philipp Brawn MD [facility] Phone Number: [phone] 01/11/2024 8:33 AM

## 2024-01-11 NOTE — TOC Progression Note (Addendum)
 Transition of Care [facility]) - Progression Note    Patient Details  Name: Catherine Ramsey MRN: [id number] Date of Birth: Nov 01, 1945  [facility]) CM/SW Contact  Yolanda Huffstetler, Arturo Late, RN Phone Number: 01/11/2024, 9:40 AM  Clinical Narrative:     Sherrlyn Dolores with [employer] , updated NCM . Face sheet says patient's insurance is [employer] PPO but when they submitted to insurance it is actually HMO. [employer] is not in network with HMO.   NCM called Mitch with [employer] he is checking to see if they are in network . Await call back   Mitch returned call. They are in network with patient's insurance. NCM completed form. Provider needs to sign in two places. PA aware. [employer] does not have e doc sign. Form on patient's chart. Once signed , NCM will email to [employer] Phillip Breach and they will submit to insurance.   NCM emailed form to [place] with [employer] and called him   Updated Amy with Christopher Cramp with [employer] is still waiting on insurance to approve, since it is already 4 pm , he does not think he will hear back from insurance today . Patient aware   Expected Discharge Plan: Home w Home Health Services Barriers to Discharge: Continued Medical Work up  Expected Discharge Plan and Services   Discharge Planning Services: CM Consult Post Acute Care Choice: Home Health Living arrangements for the past 2 months: Single Family Home                 DME Arranged: Vac DME Agency: [employer] Date DME Agency Contacted: 01/10/24 Time DME Agency Contacted: 1311 Representative spoke with at DME Agency: Sherrlyn Dolores HH Arranged: RN, PT [facility] Agency: [facility] Date [facility] Agency Contacted: 01/10/24 Time HH Agency Contacted: 1312 Representative spoke with at [facility] Agency: Amy   Social Determinants of Health (SDOH) Interventions SDOH Screenings   Food Insecurity: No Food Insecurity (01/10/2024)  Housing: Low Risk  (01/10/2024)  Transportation Needs: No Transportation Needs (01/10/2024)  Utilities: Not At Risk  (01/10/2024)  Social Connections: Socially Integrated (01/10/2024)  Tobacco Use: Low Risk  (01/09/2024)    Readmission Risk Interventions    11/28/2023   11:08 AM  Readmission Risk Prevention Plan  Post Dischage Appt Complete  Medication Screening Complete  Transportation Screening Complete

## 2024-01-11 NOTE — TOC CM/SW Note (Signed)
 The patient will not have any changes to their diet following the procedure

## 2024-01-11 NOTE — Plan of Care (Signed)
  Problem: Education: Goal: Knowledge of General Education information will improve Description: Including pain rating scale, medication(s)/side effects and non-pharmacologic comfort measures Outcome: Progressing   Problem: Health Behavior/Discharge Planning: Goal: Ability to manage health-related needs will improve Outcome: Progressing   Problem: Clinical Measurements: Goal: Will remain free from infection Outcome: Progressing   Problem: Clinical Measurements: Goal: Respiratory complications will improve Outcome: Progressing   Problem: Pain Managment: Goal: General experience of comfort will improve and/or be controlled Outcome: Progressing   Problem: Safety: Goal: Ability to remain free from injury will improve Outcome: Progressing   Problem: Skin Integrity: Goal: Risk for impaired skin integrity will decrease Outcome: Progressing

## 2024-01-12 DIAGNOSIS — S81801A Unspecified open wound, right lower leg, initial encounter: Secondary | ICD-10-CM | POA: Diagnosis not present

## 2024-01-12 DIAGNOSIS — Z9889 Other specified postprocedural states: Secondary | ICD-10-CM | POA: Diagnosis not present

## 2024-01-12 NOTE — Progress Notes (Signed)
  Progress Note    01/12/2024 8:51 AM 2 Days Post-Op  Subjective:  no complaints.  Ready to go home   Vitals:   01/11/24 2056 01/12/24 0827  BP: 122/73 (!) 151/63  Pulse: 67 69  Resp: 16 17  Temp: 98.4 F (36.9 C) 98.5 F (36.9 C)  SpO2: 100% 100%   Physical Exam: Lungs:  non labored Incisions:  R calf wound with vac in place, good seal Neurologic: A&O  CBC    Component Value Date/Time   WBC 8.5 01/10/2024 1019   RBC 3.64 (L) 01/10/2024 1019   HGB 9.8 (L) 01/10/2024 1019   HCT 32.2 (L) 01/10/2024 1019   PLT 281 01/10/2024 1019   MCV 88.5 01/10/2024 1019   MCH 26.9 01/10/2024 1019   MCHC 30.4 01/10/2024 1019   RDW 14.6 01/10/2024 1019   LYMPHSABS 2.8 11/28/2023 0349   MONOABS 0.9 11/28/2023 0349   EOSABS 0.3 11/28/2023 0349   BASOSABS 0.0 11/28/2023 0349    BMET    Component Value Date/Time   NA 142 01/10/2024 0622   NA 143 03/26/2019 1403   K 4.5 01/10/2024 0622   CL 106 01/10/2024 0622   CO2 22 11/24/2023 0708   GLUCOSE 155 (H) 01/10/2024 0622   BUN 13 01/10/2024 0622   BUN 11 03/26/2019 1403   CREATININE 1.00 01/10/2024 0622   CALCIUM  8.2 (L) 11/24/2023 0708   GFRNONAA 50 (L) 11/24/2023 0708   GFRAA 56 (L) 03/26/2019 1403    INR    Component Value Date/Time   INR 1.2 11/15/2023 1546     Intake/Output Summary (Last 24 hours) at 01/12/2024 0851 Last data filed at 01/11/2024 1400 Gross per 24 hour  Intake 600 ml  Output --  Net 600 ml     Assessment/Plan:  78 y.o. female is s/p R calf I&D 2 Days Post-Op   R calf vac with good seal Bactrim started yesterday; continue for total of 7 days Continue eliquis  Ok for discharge home when home vac delivered; office will arrange vac change for Monday or Tuesday this coming week; [facility] will perform vac changes thereafter   Cordie Deters, PA-C [facility] [phone] 01/12/2024 8:51 AM

## 2024-01-12 NOTE — TOC Progression Note (Signed)
 Transition of Care [facility]) - Progression Note    Patient Details  Name: Catherine Ramsey MRN: [id number] Date of Birth: Jan 18, 1946  Transition of Care [facility]) CM/SW Contact  Jeani Mill, RN Phone Number: 01/12/2024, 1:45 PM  Clinical Narrative:    [employer] is still waiting on insurance approval for wound vac.   Expected Discharge Plan: Home w Home Health Services Barriers to Discharge: Continued Medical Work up  Expected Discharge Plan and Services   Discharge Planning Services: CM Consult Post Acute Care Choice: Home Health Living arrangements for the past 2 months: Single Family Home                 DME Arranged: Vac DME Agency: [employer] Date DME Agency Contacted: 01/10/24 Time DME Agency Contacted: 1311 Representative spoke with at DME Agency: Sherrlyn Dolores HH Arranged: RN, PT [facility] Agency: [facility] Date [facility] Agency Contacted: 01/10/24 Time HH Agency Contacted: 1312 Representative spoke with at [facility] Agency: Amy   Social Determinants of Health (SDOH) Interventions SDOH Screenings   Food Insecurity: No Food Insecurity (01/10/2024)  Housing: Low Risk  (01/10/2024)  Transportation Needs: No Transportation Needs (01/10/2024)  Utilities: Not At Risk (01/10/2024)  Social Connections: Socially Integrated (01/10/2024)  Tobacco Use: Low Risk  (01/09/2024)    Readmission Risk Interventions    11/28/2023   11:08 AM  Readmission Risk Prevention Plan  Post Dischage Appt Complete  Medication Screening Complete  Transportation Screening Complete

## 2024-01-12 NOTE — Plan of Care (Signed)
  Problem: Clinical Measurements: Goal: Ability to maintain clinical measurements within normal limits will improve Outcome: Progressing Goal: Respiratory complications will improve Outcome: Progressing Goal: Cardiovascular complication will be avoided Outcome: Progressing   Problem: Nutrition: Goal: Adequate nutrition will be maintained Outcome: Progressing   Problem: Pain Managment: Goal: General experience of comfort will improve and/or be controlled Outcome: Progressing

## 2024-01-12 NOTE — Plan of Care (Signed)

## 2024-01-13 ENCOUNTER — Ambulatory Visit: Admitting: Vascular Surgery

## 2024-01-13 ENCOUNTER — Other Ambulatory Visit (HOSPITAL_COMMUNITY): Payer: Self-pay

## 2024-01-13 DIAGNOSIS — Z9889 Other specified postprocedural states: Secondary | ICD-10-CM | POA: Diagnosis not present

## 2024-01-13 DIAGNOSIS — I739 Peripheral vascular disease, unspecified: Secondary | ICD-10-CM | POA: Diagnosis not present

## 2024-01-13 DIAGNOSIS — T8149XA Infection following a procedure, other surgical site, initial encounter: Secondary | ICD-10-CM | POA: Diagnosis not present

## 2024-01-13 DIAGNOSIS — S81801A Unspecified open wound, right lower leg, initial encounter: Secondary | ICD-10-CM | POA: Diagnosis not present

## 2024-01-13 DIAGNOSIS — I70221 Atherosclerosis of native arteries of extremities with rest pain, right leg: Secondary | ICD-10-CM | POA: Diagnosis not present

## 2024-01-13 LAB — BASIC METABOLIC PANEL WITH GFR
Anion gap: 8 (ref 5–15)
BUN: 18 mg/dL (ref 8–23)
CO2: 27 mmol/L (ref 22–32)
Calcium: 8.6 mg/dL — ABNORMAL LOW (ref 8.9–10.3)
Chloride: 103 mmol/L (ref 98–111)
Creatinine, Ser: 1.21 mg/dL — ABNORMAL HIGH (ref 0.44–1.00)
GFR, Estimated: 46 mL/min — ABNORMAL LOW (ref >60)
Glucose, Bld: 138 mg/dL — ABNORMAL HIGH (ref 70–99)
Potassium: 4.1 mmol/L (ref 3.5–5.1)
Sodium: 138 mmol/L (ref 135–145)

## 2024-01-13 MED ORDER — TRAMADOL HCL 50 MG PO TABS
50.0000 mg | ORAL_TABLET | Freq: Four times a day (QID) | ORAL | 0 refills | Status: AC | PRN
  Filled 2024-01-13: qty 20, 5d supply, fill #0

## 2024-01-13 MED ORDER — SULFAMETHOXAZOLE-TRIMETHOPRIM 800-160 MG PO TABS
1.0000 | ORAL_TABLET | Freq: Two times a day (BID) | ORAL | 0 refills | Status: AC
  Filled 2024-01-13: qty 12, 6d supply, fill #0

## 2024-01-13 NOTE — Progress Notes (Signed)
 Pt ambulated w nurse to bathroom using rolling walker, tolerated well. Pt expected to work w PT but no orders have been placed for PT-will f/u

## 2024-01-13 NOTE — Plan of Care (Signed)
  Problem: Education: Goal: Knowledge of General Education information will improve Description: Including pain rating scale, medication(s)/side effects and non-pharmacologic comfort measures Outcome: Progressing   Problem: Clinical Measurements: Goal: Cardiovascular complication will be avoided Outcome: Progressing   Problem: Nutrition: Goal: Adequate nutrition will be maintained Outcome: Progressing   Problem: Activity: Goal: Risk for activity intolerance will decrease Outcome: Progressing   Problem: Pain Managment: Goal: General experience of comfort will improve and/or be controlled Outcome: Progressing   Problem: Safety: Goal: Ability to remain free from injury will improve Outcome: Progressing

## 2024-01-13 NOTE — TOC Transition Note (Signed)
 Transition of Care [facility]) - Discharge Note   Patient Details  Name: Catherine Ramsey MRN: [id number] Date of Birth: [birth year]  [facility]) CM/SW Contact:  Jeani Mill, RN Phone Number: 01/13/2024, 1:50 PM   Clinical Narrative:    Patient stable for discharge.  [employer] will deliver wound vac to the room. Amy with [employer] notified of discharge.  Family can transport home.   Final next level of care: Home w Home Health Services Barriers to Discharge: Barriers Resolved   Patient Goals and CMS Choice Patient states their goals for this hospitalization and ongoing recovery are:: to return to home CMS Medicare.gov Compare Post Acute Care list provided to:: Patient Choice offered to / list presented to : Patient      Discharge Placement               Home        Discharge Plan and Services Additional resources added to the After Visit Summary for     Discharge Planning Services: CM Consult Post Acute Care Choice: Home Health          DME Arranged: Vac DME Agency: [employer] Date DME Agency Contacted: 01/10/24 Time DME Agency Contacted: 1311 Representative spoke with at DME Agency: Sherrlyn Dolores HH Arranged: RN, PT [facility] Agency: [facility] Date [facility] Agency Contacted: 01/10/24 Time HH Agency Contacted: 1312 Representative spoke with at [facility] Agency: Amy  Social Drivers of Health (SDOH) Interventions SDOH Screenings   Food Insecurity: No Food Insecurity (01/10/2024)  Housing: Low Risk  (01/10/2024)  Transportation Needs: No Transportation Needs (01/10/2024)  Utilities: Not At Risk (01/10/2024)  Social Connections: Socially Integrated (01/10/2024)  Tobacco Use: Low Risk  (01/09/2024)     Readmission Risk Interventions    11/28/2023   11:08 AM  Readmission Risk Prevention Plan  Post Dischage Appt Complete  Medication Screening Complete  Transportation Screening Complete

## 2024-01-13 NOTE — Progress Notes (Signed)
  Progress Note    01/13/2024 7:50 AM 3 Days Post-Op  Subjective:  no complaints.  Waiting for home vac   Vitals:   01/13/24 0425 01/13/24 0744  BP: (!) 160/78 (!) 143/64  Pulse: 65 64  Resp: 17 16  Temp: 98.5 F (36.9 C) 98.5 F (36.9 C)  SpO2: 100% 100%   Physical Exam: Lungs:  non labored Incisions:  R calf with good seal on vac Extremities:  foot warm; dry gangrene 2,3 toes Neurologic: A&O  CBC    Component Value Date/Time   WBC 8.5 01/10/2024 1019   RBC 3.64 (L) 01/10/2024 1019   HGB 9.8 (L) 01/10/2024 1019   HCT 32.2 (L) 01/10/2024 1019   PLT 281 01/10/2024 1019   MCV 88.5 01/10/2024 1019   MCH 26.9 01/10/2024 1019   MCHC 30.4 01/10/2024 1019   RDW 14.6 01/10/2024 1019   LYMPHSABS 2.8 11/28/2023 0349   MONOABS 0.9 11/28/2023 0349   EOSABS 0.3 11/28/2023 0349   BASOSABS 0.0 11/28/2023 0349    BMET    Component Value Date/Time   NA 138 01/13/2024 0530   NA 143 03/26/2019 1403   K 4.1 01/13/2024 0530   CL 103 01/13/2024 0530   CO2 27 01/13/2024 0530   GLUCOSE 138 (H) 01/13/2024 0530   BUN 18 01/13/2024 0530   BUN 11 03/26/2019 1403   CREATININE 1.21 (H) 01/13/2024 0530   CALCIUM  8.6 (L) 01/13/2024 0530   GFRNONAA 46 (L) 01/13/2024 0530   GFRAA 56 (L) 03/26/2019 1403    INR    Component Value Date/Time   INR 1.2 11/15/2023 1546     Intake/Output Summary (Last 24 hours) at 01/13/2024 0750 Last data filed at 01/13/2024 0200 Gross per 24 hour  Intake 360 ml  Output 50 ml  Net 310 ml     Assessment/Plan:  78 y.o. female is s/p R calf I &D 3 Days Post-Op   R calf vac with good seal Continue Bactrim  Home when [facility] and home vac approved   Cordie Deters, PA-C [facility] [phone] 01/13/2024 7:50 AM

## 2024-01-16 ENCOUNTER — Ambulatory Visit: Attending: Vascular Surgery | Admitting: Physician Assistant

## 2024-01-16 VITALS — BP 154/74 | HR 84 | Temp 98.2°F | Ht 65.0 in | Wt 175.0 lb

## 2024-01-16 DIAGNOSIS — S81801A Unspecified open wound, right lower leg, initial encounter: Secondary | ICD-10-CM

## 2024-01-16 NOTE — Discharge Summary (Signed)
 Discharge Summary  Patient ID: Catherine Ramsey [id number] 78 y.o. Apr 20, 1946  Admit date: 01/10/2024  Discharge date and time: 01/13/2024  4:43 PM   Admitting Physician: Philipp Brawn, MD   Discharge Physician: same  Admission Diagnoses: Infection of deep incisional surgical site after procedure, initial encounter [T81.42XA] PAD (peripheral artery disease) (HCC) [I73.9]  Discharge Diagnoses: same  Admission Condition: fair  Discharged Condition: fair  Indication for Admission: post op care  Hospital Course: [name] is a 78 year old female who underwent right common femoral to peroneal artery bypass with PTFE on 11/22/2023 by Dr. Susi Eric.  She developed dehiscence of her below the knee incision and required debridement with application of Kerecis and wound VAC by Dr. Susi Eric on 01/10/2024.  She tolerated procedure well and was admitted to the hospital postoperatively.  TOC was consulted for home health including RN and home VAC.  She was started on Bactrim  postoperatively.  Discharge was delayed due to time spent securing home health RN and home VAC.  Eventually home VAC was delivered to the room and VAC was changed on 01/13/24 prior to discharge home.  She will follow-up in the office on Monday, 01/16/2024 for VAC change.  She was prescribed 7 more days of Bactrim .  She was prescribed narcotic pain medication for postoperative pain control.  She was discharged home in stable condition.  Consults: None  Treatments: surgery: Irrigation and debridement of right below the knee incision with placement of Kerecis and wound VAC on 01/10/2024  Discharge Exam: See progress note 01/13/24 Vitals:   01/13/24 0744 01/13/24 1541  BP: (!) 143/64 (!) 121/58  Pulse: 64 77  Resp: 16 16  Temp: 98.5 F (36.9 C) 98.2 F (36.8 C)  SpO2: 100% 100%     Disposition: Discharge disposition: 01-Home or Self Care       Patient Instructions:  Allergies as of 01/13/2024       Reactions    Lovastatin Itching, Rash   Nitrofurantoin Rash        Medication List     TAKE these medications    acetaminophen  500 MG tablet Commonly known as: TYLENOL  Take 1,000 mg by mouth every 6 (six) hours as needed for moderate pain.   amLODipine  10 MG tablet Commonly known as: NORVASC  Take 10 mg by mouth daily.   apixaban  5 MG Tabs tablet Commonly known as: ELIQUIS  Take 1 tablet (5 mg total) by mouth 2 (two) times daily.   aspirin  81 MG chewable tablet Chew 81 mg by mouth daily.   B-D SINGLE USE SWABS REGULAR Pads   cilostazol  50 MG tablet Commonly known as: PLETAL  Take 1 tablet (50 mg total) by mouth 2 (two) times daily.   ezetimibe  10 MG tablet Commonly known as: ZETIA  Take 1 tablet (10 mg total) by mouth every evening.   gabapentin  100 MG capsule Commonly known as: NEURONTIN  Take 1 capsule (100 mg total) by mouth 3 (three) times daily.   ibuprofen  200 MG tablet Commonly known as: ADVIL  Take 400 mg by mouth daily as needed for moderate pain.   insulin  aspart 100 UNIT/ML FlexPen Commonly known as: NOVOLOG  Inject 10 Units into the skin 3 (three) times daily as needed for high blood sugar. Takes Novolog  10 units with meals (also takes a few extra units if glucose is elevated)   insulin  glargine 100 UNIT/ML injection Commonly known as: LANTUS  Inject 19 Units into the skin daily.   Insulin  Syringe-Needle U-100 31G X 5/16 0.5 ML Misc Use  one syringe twice daily DX:  250.70   Klor-Con  M20 20 MEQ tablet Generic drug: potassium chloride  SA Take 20 mEq by mouth daily.   losartan -hydrochlorothiazide  50-12.5 MG tablet Commonly known as: HYZAAR Take 1 tablet by mouth every morning.   omeprazole 20 MG capsule Commonly known as: PRILOSEC Take 20 mg by mouth daily as needed (acid reflux).   rosuvastatin  20 MG tablet Commonly known as: CRESTOR  TAKE 1 TABLET EVERY DAY (DISCONTINUE PRAVASTATIN )   sulfamethoxazole -trimethoprim  800-160 MG tablet Commonly known as:  BACTRIM  DS Take 1 tablet by mouth every 12 (twelve) hours for 6 days.   traMADol  50 MG tablet Commonly known as: ULTRAM  Take 1 tablet (50 mg total) by mouth every 6 (six) hours as needed.   True Metrix Air Glucose Meter w/Device Kit   True Metrix Blood Glucose Test test strip Generic drug: glucose blood   TRUEplus Lancets 33G Misc       Activity: activity as tolerated Diet: regular diet Wound Care: Wound VAC right leg managed by home health  Follow-up with [facility] in 1 week.  Signed: Cordie Deters, PA-C 01/16/2024 12:41 PM [facility] Office: [phone]

## 2024-01-16 NOTE — Progress Notes (Signed)
 POST OPERATIVE OFFICE NOTE    CC:  F/u for surgery  HPI:  This is a 78 y.o. female who is s/p right common femoral to peroneal artery bypass with PTFE on 11/22/2023 by Dr. Susi Eric.  She developed dehiscence of her below the knee incision and required debridement with application of Kerecis and wound VAC by Dr. Susi Eric on 01/10/2024.  Home health and home VAC was arranged.  She returns to clinic today for a wound VAC change however did not bring any supplies.  Wound VAC was changed on Friday, 01/13/2024 prior to discharge from the hospital.  She denies rest pain.  She has gangrenous 2nd and 3rd toes on her right foot.  She is on Eliquis  and aspirin  daily.  She was prescribed Bactrim  at discharge.  Allergies  Allergen Reactions   Lovastatin Itching and Rash   Nitrofurantoin Rash    Current Outpatient Medications  Medication Sig Dispense Refill   acetaminophen  (TYLENOL ) 500 MG tablet Take 1,000 mg by mouth every 6 (six) hours as needed for moderate pain.     Alcohol  Swabs (B-D SINGLE USE SWABS REGULAR) PADS      amLODipine  (NORVASC ) 10 MG tablet Take 10 mg by mouth daily.     apixaban  (ELIQUIS ) 5 MG TABS tablet Take 1 tablet (5 mg total) by mouth 2 (two) times daily. 180 tablet 1   aspirin  81 MG chewable tablet Chew 81 mg by mouth daily.     Blood Glucose Monitoring Suppl (TRUE METRIX AIR GLUCOSE METER) w/Device KIT      cilostazol  (PLETAL ) 50 MG tablet Take 1 tablet (50 mg total) by mouth 2 (two) times daily. 180 tablet 3   ezetimibe  (ZETIA ) 10 MG tablet Take 1 tablet (10 mg total) by mouth every evening. 90 tablet 3   gabapentin  (NEURONTIN ) 100 MG capsule Take 1 capsule (100 mg total) by mouth 3 (three) times daily. 90 capsule 3   ibuprofen  (ADVIL ) 200 MG tablet Take 400 mg by mouth daily as needed for moderate pain.     insulin  aspart (NOVOLOG ) 100 UNIT/ML FlexPen Inject 10 Units into the skin 3 (three) times daily as needed for high blood sugar. Takes Novolog  10 units with meals (also takes  a few extra units if glucose is elevated)     insulin  glargine (LANTUS ) 100 UNIT/ML injection Inject 19 Units into the skin daily.     Insulin  Syringe-Needle U-100 31G X 5/16 0.5 ML MISC Use one syringe twice daily DX:  250.70     KLOR-CON  M20 20 MEQ tablet Take 20 mEq by mouth daily.  0   losartan -hydrochlorothiazide  (HYZAAR) 50-12.5 MG tablet Take 1 tablet by mouth every morning.     omeprazole (PRILOSEC) 20 MG capsule Take 20 mg by mouth daily as needed (acid reflux).     rosuvastatin  (CRESTOR ) 20 MG tablet TAKE 1 TABLET EVERY DAY (DISCONTINUE PRAVASTATIN ) 90 tablet 0   sulfamethoxazole -trimethoprim  (BACTRIM  DS) 800-160 MG tablet Take 1 tablet by mouth every 12 (twelve) hours for 6 days. 12 tablet 0   traMADol  (ULTRAM ) 50 MG tablet Take 1 tablet (50 mg total) by mouth every 6 (six) hours as needed. 20 tablet 0   TRUE METRIX BLOOD GLUCOSE TEST test strip      TRUEplus Lancets 33G MISC      No current facility-administered medications for this visit.     ROS:  See HPI  Physical Exam:  Vitals:   01/16/24 1011  BP: (!) 154/74  Pulse: 84  Temp:  98.2 F (36.8 C)  TempSrc: Temporal  SpO2: 96%  Weight: 175 lb (79.4 kg)  Height: 5' 5 (1.651 m)    Incision: Wound VAC with good seal Extremities: 2nd and 3rd toes dry gangrene; AT and peroneal by Doppler Neuro: A&O  Assessment/Plan:  This is a 78 y.o. female who is s/p: Irrigation and debridement of right below the knee incision with wound VAC placement  Wound VAC was changed a few days ago prior to discharge from the hospital.  There is a good seal with minimal serosanguineous drainage.  Unfortunately she forgot her wound VAC supplies and we do not have any at the office.  For this reason I did not remove the wound VAC today in the office.  She will contact [facility] for a wound VAC change later this week.  We discussed transitioning to wet-to-dry if she is unable to continue home health due to financial burden.  We will reevaluate her  wound in 2 to 3 weeks.  She will also need a right leg bypass duplex and ABI as her incision heals.  She can continue Betadine paint to the 2nd and 3rd toes of the right foot.  Continue Bactrim  to completion.   Cordie Deters, PA-C [facility] [phone]  Clinic MD:  Charlotte Cookey

## 2024-01-19 DIAGNOSIS — M62262 Nontraumatic ischemic infarction of muscle, left lower leg: Secondary | ICD-10-CM | POA: Diagnosis not present

## 2024-01-19 DIAGNOSIS — R269 Unspecified abnormalities of gait and mobility: Secondary | ICD-10-CM | POA: Diagnosis not present

## 2024-01-20 ENCOUNTER — Ambulatory Visit

## 2024-01-20 ENCOUNTER — Encounter (HOSPITAL_COMMUNITY)

## 2024-01-20 ENCOUNTER — Other Ambulatory Visit (HOSPITAL_COMMUNITY)

## 2024-01-25 ENCOUNTER — Telehealth: Payer: Self-pay

## 2024-01-25 NOTE — Telephone Encounter (Signed)
 Pt called [facility] Triage line asking for a refill of her pain medication, Tramadol .  Patient referred to her PCP for additional pain medication. Patient verbally agreed she would contact her PCP.

## 2024-01-31 ENCOUNTER — Telehealth: Payer: Self-pay

## 2024-01-31 NOTE — Telephone Encounter (Signed)
 [facility] called for verbal order for wound vac placement.  Verbal order given to place wound vac, 125 mmHg RN will come out 2x a week for 8 weeks.

## 2024-02-04 DIAGNOSIS — R269 Unspecified abnormalities of gait and mobility: Secondary | ICD-10-CM | POA: Diagnosis not present

## 2024-02-04 DIAGNOSIS — M62262 Nontraumatic ischemic infarction of muscle, left lower leg: Secondary | ICD-10-CM | POA: Diagnosis not present

## 2024-02-10 ENCOUNTER — Ambulatory Visit: Admitting: Podiatry

## 2024-02-12 DIAGNOSIS — I70221 Atherosclerosis of native arteries of extremities with rest pain, right leg: Secondary | ICD-10-CM | POA: Diagnosis not present

## 2024-02-12 DIAGNOSIS — I739 Peripheral vascular disease, unspecified: Secondary | ICD-10-CM | POA: Diagnosis not present

## 2024-02-12 DIAGNOSIS — T8149XA Infection following a procedure, other surgical site, initial encounter: Secondary | ICD-10-CM | POA: Diagnosis not present

## 2024-02-16 NOTE — Progress Notes (Unsigned)
 POST OPERATIVE OFFICE NOTE    CC:  F/u for surgery  HPI:  This is a 78 y.o. female who is s/p right CFA to peroneal artery bypass with PTFE and vein cuff on 11/22/2023 by Dr. Pearline for CLI with rest pain.  She subsequently underwent debridement of right lower leg wound with application of Kercis and wound vac on 01/10/2024 also by Dr. Pearline.   Pt was last seen on 01/16/2024 and at that time she was not having any rest pain.  She had gangrenous right 2nd and 3rd toes.  She comes in today for wound check.  She was instructed to continue betadine paint to the 2nd and 3rd right toes and continue bactrim  to completion.   Pt returns today for follow up.  Pt states she denies any pain in her foot unless they wrap it too tight.  She states the wound vac is getting changed a couple times per week.  She states she was told she could not get her foot wet.     Allergies  Allergen Reactions   Lovastatin Itching and Rash   Nitrofurantoin Rash    Current Outpatient Medications  Medication Sig Dispense Refill   acetaminophen  (TYLENOL ) 500 MG tablet Take 1,000 mg by mouth every 6 (six) hours as needed for moderate pain.     Alcohol  Swabs (B-D SINGLE USE SWABS REGULAR) PADS      amLODipine  (NORVASC ) 10 MG tablet Take 10 mg by mouth daily.     apixaban  (ELIQUIS ) 5 MG TABS tablet Take 1 tablet (5 mg total) by mouth 2 (two) times daily. 180 tablet 1   aspirin  81 MG chewable tablet Chew 81 mg by mouth daily.     Blood Glucose Monitoring Suppl (TRUE METRIX AIR GLUCOSE METER) w/Device KIT      cilostazol  (PLETAL ) 50 MG tablet Take 1 tablet (50 mg total) by mouth 2 (two) times daily. 180 tablet 3   ezetimibe  (ZETIA ) 10 MG tablet Take 1 tablet (10 mg total) by mouth every evening. 90 tablet 3   gabapentin  (NEURONTIN ) 100 MG capsule Take 1 capsule (100 mg total) by mouth 3 (three) times daily. 90 capsule 3   ibuprofen  (ADVIL ) 200 MG tablet Take 400 mg by mouth daily as needed for moderate pain.     insulin   aspart (NOVOLOG ) 100 UNIT/ML FlexPen Inject 10 Units into the skin 3 (three) times daily as needed for high blood sugar. Takes Novolog  10 units with meals (also takes a few extra units if glucose is elevated)     insulin  glargine (LANTUS ) 100 UNIT/ML injection Inject 19 Units into the skin daily.     Insulin  Syringe-Needle U-100 31G X 5/16 0.5 ML MISC Use one syringe twice daily DX:  250.70     KLOR-CON  M20 20 MEQ tablet Take 20 mEq by mouth daily.  0   losartan -hydrochlorothiazide  (HYZAAR) 50-12.5 MG tablet Take 1 tablet by mouth every morning.     omeprazole (PRILOSEC) 20 MG capsule Take 20 mg by mouth daily as needed (acid reflux).     rosuvastatin  (CRESTOR ) 20 MG tablet TAKE 1 TABLET EVERY DAY (DISCONTINUE PRAVASTATIN ) 90 tablet 0   traMADol  (ULTRAM ) 50 MG tablet Take 1 tablet (50 mg total) by mouth every 6 (six) hours as needed. 20 tablet 0   TRUE METRIX BLOOD GLUCOSE TEST test strip      TRUEplus Lancets 33G MISC      No current facility-administered medications for this visit.     ROS:  See HPI  Physical Exam:  Today's Vitals   02/17/24 0824  BP: (!) 145/76  Pulse: 78  Temp: 97.9 F (36.6 C)  TempSrc: Temporal  Weight: 177 lb 12.8 oz (80.6 kg)  PainSc: 0-No pain   Body mass index is 29.59 kg/m.    Extremities:  RLE wound looks great with beefy red granulation; brisk right peroneal and DP doppler flow; stable gangrenous changes to right 2nd and 3rd toes        Assessment/Plan:  This is a 78 y.o. female who is s/p:  right CFA to peroneal artery bypass with PTFE and vein cuff on 11/22/2023 by Dr. Pearline for CLI with rest pain.  She subsequently underwent debridement of right lower leg wound with application of Kercis and wound vac on 01/10/2024 also by Dr. Pearline.   -pt with brisk doppler flow right foot.  -RLE wound with beefy red granulation tissue.  Pt seen with Dr. Pearline and will change from vac to wet to dry saline dressings daily.  I did teach her and her  husband how to do this and they were given some supplies.  Discussed that she can shower daily with soap and water and then apply dressing starting tomorrow.  She can wash her foot, but discussed to make sure it is dry otherwise.   -will have her return in 6 weeks for wound check.  Suspect this will be near healed.  If so, we will then schedule her for RLE arterial duplex and ABI.  She is in agreement with this plan. -continue asa/Eliquis /statin.   Lucie Apt, [facility] [phone]   Clinic MD:  Pearline

## 2024-02-17 ENCOUNTER — Ambulatory Visit: Attending: Vascular Surgery | Admitting: Physician Assistant

## 2024-02-17 ENCOUNTER — Encounter: Payer: Self-pay | Admitting: Physician Assistant

## 2024-02-17 VITALS — BP 145/76 | HR 78 | Temp 97.9°F | Wt 177.8 lb

## 2024-02-17 DIAGNOSIS — I70221 Atherosclerosis of native arteries of extremities with rest pain, right leg: Secondary | ICD-10-CM

## 2024-02-17 DIAGNOSIS — S81801A Unspecified open wound, right lower leg, initial encounter: Secondary | ICD-10-CM

## 2024-02-17 DIAGNOSIS — Z95828 Presence of other vascular implants and grafts: Secondary | ICD-10-CM

## 2024-02-23 DIAGNOSIS — T8189XA Other complications of procedures, not elsewhere classified, initial encounter: Secondary | ICD-10-CM | POA: Diagnosis not present

## 2024-02-25 ENCOUNTER — Other Ambulatory Visit: Payer: Self-pay | Admitting: Cardiology

## 2024-02-25 DIAGNOSIS — I739 Peripheral vascular disease, unspecified: Secondary | ICD-10-CM

## 2024-03-05 ENCOUNTER — Other Ambulatory Visit: Payer: Self-pay | Admitting: Physician Assistant

## 2024-03-05 ENCOUNTER — Telehealth: Payer: Self-pay

## 2024-03-05 NOTE — Telephone Encounter (Signed)
 Catherine Ramsey [facility] RN called to get verbal order for pt's wound care  Verbal order given for Light compression with Calcium  Alginate to woundbed on right inner calf and right outer lower leg.  Patient also reported needing more pain medication.   Patient is receiving her pain medications from [facility]. Patient knows [facility] will not be prescribing future pain meds for her.

## 2024-03-08 DIAGNOSIS — T8189XA Other complications of procedures, not elsewhere classified, initial encounter: Secondary | ICD-10-CM | POA: Diagnosis not present

## 2024-03-12 ENCOUNTER — Telehealth: Payer: Self-pay

## 2024-03-12 NOTE — Telephone Encounter (Signed)
 Catherine Hamilton, RN [phone] [facility] called reporting pt's right lower extremity small, lateral wound looks infected - red, painful, purulent drainage and warm.  PCP was contacted and will see pt tomorrow.  [facility] will continue to monitor the wound and keep [facility] updated.

## 2024-03-17 DIAGNOSIS — T8189XA Other complications of procedures, not elsewhere classified, initial encounter: Secondary | ICD-10-CM | POA: Diagnosis not present

## 2024-03-27 ENCOUNTER — Telehealth: Payer: Self-pay

## 2024-03-27 DIAGNOSIS — T8189XA Other complications of procedures, not elsewhere classified, initial encounter: Secondary | ICD-10-CM | POA: Diagnosis not present

## 2024-03-27 DIAGNOSIS — I872 Venous insufficiency (chronic) (peripheral): Secondary | ICD-10-CM | POA: Diagnosis not present

## 2024-03-27 DIAGNOSIS — S81801A Unspecified open wound, right lower leg, initial encounter: Secondary | ICD-10-CM | POA: Diagnosis not present

## 2024-03-27 NOTE — Telephone Encounter (Signed)
 Order for as needed visit for 03/27/24 wound care given to [place], [facility].

## 2024-03-30 ENCOUNTER — Ambulatory Visit: Attending: Vascular Surgery | Admitting: Physician Assistant

## 2024-03-30 ENCOUNTER — Encounter: Payer: Self-pay | Admitting: Vascular Surgery

## 2024-03-30 ENCOUNTER — Telehealth: Payer: Self-pay

## 2024-03-30 VITALS — BP 132/66 | HR 83 | Temp 97.7°F | Wt 173.7 lb

## 2024-03-30 DIAGNOSIS — I739 Peripheral vascular disease, unspecified: Secondary | ICD-10-CM

## 2024-03-30 DIAGNOSIS — S81801S Unspecified open wound, right lower leg, sequela: Secondary | ICD-10-CM

## 2024-03-30 DIAGNOSIS — Z95828 Presence of other vascular implants and grafts: Secondary | ICD-10-CM

## 2024-03-30 MED ORDER — COLLAGENASE 250 UNIT/GM EX OINT: 1.0000 | TOPICAL_OINTMENT | Freq: Every day | CUTANEOUS | 1 refills | Status: AC

## 2024-03-30 NOTE — Telephone Encounter (Signed)
 Ted Deiters [facility] contacted with verbal order  To apply Santyl  to lateral right leg wound and wet to dry to medial right leg wound. Wrap with ace bandage.  D/C Light compression with Calcium  Alginate   Visit 3x week.   Cecilia verbalized understanding of the above order.

## 2024-04-03 NOTE — Progress Notes (Signed)
 POST OPERATIVE OFFICE NOTE    CC:  F/u for surgery  HPI:  Catherine Ramsey is a 78 y.o. female who is here for repeat wound check.  She has a history of right common femoral artery to peroneal artery bypass with PTFE and vein cuff on 11/22/2023 by Dr. Pearline for critical limb ischemia with rest pain and tissue loss.  She subsequently required debridement of her right medial lower leg wound with application of Kerecis and wound VAC on 01/10/2024 by Dr. Pearline.  At her last office visit her wound VAC was discontinued and she was instructed to switch to daily wet-to-dry dressing changes of her right medial lower leg wound.  At that time she also had a new, small wound on her right lateral lower leg caused by the wound VAC dressing.  She has had home health assistance with regular wound care since her debridement.  She returns today for follow-up.  She says that home health recently started doing a new dressing on her right leg.  They are wrapping her entire right lower leg with a layer of calcium  alginate followed by Coban for light compression.  The patient says that this leaves her right lower leg wet when the dressing comes off daily.  She feels like her right lower leg wounds are getting smaller, there is some questionable yellow tissue in her lateral leg wound.  She also feels like her toes are almost completely healed.  She has been painting them daily with Betadine.  She denies any rest pain.  She denies any fevers or chills.  She denies any purulent drainage from her wounds.   Allergies  Allergen Reactions   Lovastatin Itching and Rash   Nitrofurantoin Rash    Current Outpatient Medications  Medication Sig Dispense Refill   collagenase  (SANTYL ) 250 UNIT/GM ointment Apply 1 Application topically daily. 30 g 1   acetaminophen  (TYLENOL ) 500 MG tablet Take 1,000 mg by mouth every 6 (six) hours as needed for moderate pain.     Alcohol  Swabs (B-D SINGLE USE SWABS REGULAR) PADS      amLODipine   (NORVASC ) 10 MG tablet Take 10 mg by mouth daily.     apixaban  (ELIQUIS ) 5 MG TABS tablet Take 1 tablet (5 mg total) by mouth 2 (two) times daily. 180 tablet 1   aspirin  81 MG chewable tablet Chew 81 mg by mouth daily.     Blood Glucose Monitoring Suppl (TRUE METRIX AIR GLUCOSE METER) w/Device KIT      cilostazol  (PLETAL ) 50 MG tablet Take 1 tablet (50 mg total) by mouth 2 (two) times daily. Patient must call and schedule annual appointment for further refills first attempt 60 tablet 0   ezetimibe  (ZETIA ) 10 MG tablet Take 1 tablet (10 mg total) by mouth every evening. 90 tablet 3   gabapentin  (NEURONTIN ) 100 MG capsule Take 1 capsule (100 mg total) by mouth 3 (three) times daily. 90 capsule 3   ibuprofen  (ADVIL ) 200 MG tablet Take 400 mg by mouth daily as needed for moderate pain.     insulin  aspart (NOVOLOG ) 100 UNIT/ML FlexPen Inject 10 Units into the skin 3 (three) times daily as needed for high blood sugar. Takes Novolog  10 units with meals (also takes a few extra units if glucose is elevated)     insulin  glargine (LANTUS ) 100 UNIT/ML injection Inject 19 Units into the skin daily.     Insulin  Syringe-Needle U-100 31G X 5/16 0.5 ML MISC Use one syringe twice daily DX:  250.70     KLOR-CON  M20 20 MEQ tablet Take 20 mEq by mouth daily.  0   losartan -hydrochlorothiazide  (HYZAAR) 50-12.5 MG tablet Take 1 tablet by mouth every morning.     omeprazole (PRILOSEC) 20 MG capsule Take 20 mg by mouth daily as needed (acid reflux).     rosuvastatin  (CRESTOR ) 20 MG tablet TAKE 1 TABLET EVERY DAY (DISCONTINUE PRAVASTATIN ) 90 tablet 0   traMADol  (ULTRAM ) 50 MG tablet Take 1 tablet (50 mg total) by mouth every 6 (six) hours as needed. 20 tablet 0   TRUE METRIX BLOOD GLUCOSE TEST test strip      TRUEplus Lancets 33G MISC      No current facility-administered medications for this visit.     ROS:  See HPI  Physical Exam:  Incision: Right medial lower leg wound healing well with healthy granulation  tissue.  Right lateral lower leg wound looks okay with yellow slough in the wound bed, no signs of infection Extremities: Brisk right AT and peroneal Doppler signals.  Significantly improved dry gangrene of right 2nd and 3rd toes Neuro: Intact motor and sensation of right lower extremity           Assessment/Plan:  This is a 78 y.o. female who is here for wound check  - The patient recently underwent right common femoral to peroneal artery bypass with PTFE and vein cuff.  She subsequently required irrigation and debridement of her right medial lower leg wound for dehiscence.  Originally she had a wound VAC placed on this wound, and now she has been transitioned to wet-to-dry dressings - It appears that home health was doing the wrong dressings to the patient's leg over the past couple of weeks.  She was receiving daily dressings to the right lower leg with a layer of calcium  alginate and then Coban for light compression.  Fortunately her medial leg wound appears to be healing still.  Her right lateral leg wound looks okay with significant fibrinous exudate.  There are no signs of infection. - Her right lower extremity remains well-perfused with a brisk peroneal and AT Doppler signal -The gangrenous changes to her right 2nd and 3rd toes appears improved - We have altered her home health orders for further dressing changes to the right leg.  The patient and home health have been instructed to perform normal saline wet-to-dry dressing changes to the medial lower leg wound only.  Santyl  should be applied to the lateral leg wound.  The rest of the lower leg should be wrapped with an ace for light compression - She can follow-up with our office in 1 month for repeat wound check   Ahmed Holster, PA-C [facility] [phone]   Clinic MD:  Pearline

## 2024-04-04 ENCOUNTER — Telehealth: Payer: Self-pay

## 2024-04-04 NOTE — Telephone Encounter (Signed)
 Jenna, RN [phone] [facility] called for verbal order to change frequency of pt care  Verbal order given for nursing care 2x/week for 9 weeks starting week of 04/02/24.

## 2024-04-06 DIAGNOSIS — I872 Venous insufficiency (chronic) (peripheral): Secondary | ICD-10-CM | POA: Diagnosis not present

## 2024-04-06 DIAGNOSIS — S81801A Unspecified open wound, right lower leg, initial encounter: Secondary | ICD-10-CM | POA: Diagnosis not present

## 2024-04-06 DIAGNOSIS — T8189XA Other complications of procedures, not elsewhere classified, initial encounter: Secondary | ICD-10-CM | POA: Diagnosis not present

## 2024-04-09 ENCOUNTER — Telehealth: Payer: Self-pay

## 2024-04-09 NOTE — Telephone Encounter (Signed)
 Jenna from [facility] called reporting pt's right, lateral lower extremity wound is macerated and getting bigger.  Consulted McKenzie Schuh,PA  for change in wound care order.  Verbal order given for right lateral, lower extremity wound: Stop applying Santyl  to the right, lateral lower extremity wound. Perform normal saline, wet-to-dry dressing to the wound bed and wrap the rest of the lower leg from toes to knee with an ACE wrap applying moderate compression.  The wound care for the medial lower leg wound should remain the same - normal saline wet-to-dry dressing.  Jenna verbally expressed she understood the order.

## 2024-04-13 ENCOUNTER — Telehealth: Payer: Self-pay

## 2024-04-13 NOTE — Telephone Encounter (Signed)
 Called patient to follow up on wound care.

## 2024-04-19 ENCOUNTER — Ambulatory Visit: Attending: Vascular Surgery | Admitting: Physician Assistant

## 2024-04-19 ENCOUNTER — Ambulatory Visit: Attending: Cardiology | Admitting: Cardiology

## 2024-04-19 ENCOUNTER — Telehealth: Payer: Self-pay

## 2024-04-19 VITALS — BP 137/66 | HR 70 | Ht 67.0 in | Wt 173.0 lb

## 2024-04-19 VITALS — BP 134/64 | HR 101 | Temp 97.7°F | Wt 175.1 lb

## 2024-04-19 DIAGNOSIS — S81801S Unspecified open wound, right lower leg, sequela: Secondary | ICD-10-CM | POA: Diagnosis not present

## 2024-04-19 DIAGNOSIS — R9431 Abnormal electrocardiogram [ECG] [EKG]: Secondary | ICD-10-CM | POA: Diagnosis not present

## 2024-04-19 DIAGNOSIS — E7800 Pure hypercholesterolemia, unspecified: Secondary | ICD-10-CM

## 2024-04-19 DIAGNOSIS — I739 Peripheral vascular disease, unspecified: Secondary | ICD-10-CM

## 2024-04-19 DIAGNOSIS — I6523 Occlusion and stenosis of bilateral carotid arteries: Secondary | ICD-10-CM | POA: Diagnosis not present

## 2024-04-19 DIAGNOSIS — I499 Cardiac arrhythmia, unspecified: Secondary | ICD-10-CM

## 2024-04-19 NOTE — Progress Notes (Signed)
  Cardiology Office Note   Date:  04/19/2024  ID:  Catherine Ramsey, DOB Feb 17, 1946, MRN [id number] PCP: Yolande Toribio MATSU, MD  [facility] Providers Cardiologist:  Gordy Bergamo, MD     History of Present Illness Catherine Ramsey is a 78 y.o. female with history of DM, HTN, HLD and severe PAD. She has a hx of  right CFA to peroneal artery bypass with PTFE and vein cuff on 11/22/2023 by Dr. Pearline for CLI with rest pain.  She subsequently underwent debridement of right lower leg wound with application of Kercis and wound vac on 01/10/2024 also by Dr. Pearline.   She was being seen in vascular office today for follow up and noted increased HR with irregularity. Pulse recorded at 101 bpm. Cardiology evaluation recommended. No prior history of Afib.   Prior Echo in March 2024 showed normal LV function with mild MR, TR. Myoview low risk at that time.   Of note she was in the hospital for prolonged period of time from April- June and was on telemetry.  ROS: patient denies any palpitations, dizziness, chest pain or dyspnea  Studies Reviewed EKG Interpretation Date/Time:  Thursday April 19 2024 15:42:45 EDT Ventricular Rate:  70 PR Interval:  112 QRS Duration:  74 QT Interval:  378 QTC Calculation: 408 R Axis:   -10  Text Interpretation: Normal sinus rhythm with sinus arrhythmia Inferior infarct (cited on or before 10-Jan-2024) When compared with ECG of 10-Jan-2024 06:09, Premature ventricular complexes are no longer Present Premature atrial complexes are no longer Present Confirmed by [place], Lukas Pelcher [phone]) on 04/19/2024 3:46:01 PM    Risk Assessment/Calculations           Physical Exam VS:  There were no vitals taken for this visit.       Wt Readings from Last 3 Encounters:  04/19/24 175 lb 1.6 oz (79.4 kg)  03/30/24 173 lb 11.2 oz (78.8 kg)  02/17/24 177 lb 12.8 oz (80.6 kg)    GEN: Well nourished, well developed in no acute distress NECK: No JVD; No carotid  bruits CARDIAC: RRR, no murmurs, rubs, gallops RESPIRATORY:  Clear to auscultation without rales, wheezing or rhonchi  ABDOMEN: Soft, non-tender, non-distended EXTREMITIES:  No edema; No deformity   ASSESSMENT AND PLAN Tachycardia. Ecg today shows NSR. I reviewed extensive telemetry tracings from her hospital stay and saw only one very brief run of SVT. No Afib. She is reassured today. No further evaluation needed unless she develops significant symptoms of tachycardia.        Dispo: PRN  Signed, Helmuth Recupero [place], MD

## 2024-04-19 NOTE — Patient Instructions (Signed)
 Medication Instructions:  Continue same   Lab Work: None ordered  Testing/Procedures: None ordered  Follow-Up: At [facility], you and your health needs are our priority.  As part of our continuing mission to provide you with exceptional heart care, our providers are all part of one team.  This team includes your primary Cardiologist (physician) and Advanced Practice Providers or APPs (Physician Assistants and Nurse Practitioners) who all work together to provide you with the care you need, when you need it.  Your next appointment:  As needed    Provider:  Dr.Jordan  We recommend signing up for the patient portal called MyChart.  Sign up information is provided on this After Visit Summary.  MyChart is used to connect with patients for Virtual Visits (Telemedicine).  Patients are able to view lab/test results, encounter notes, upcoming appointments, etc.  Non-urgent messages can be sent to your provider as well.   To learn more about what you can do with MyChart, go to [email or web address].

## 2024-04-19 NOTE — Telephone Encounter (Signed)
 Jenna, RN from [facility] called reporting pt's left lower lateral wound having green drainage.  Triage appt scheduled for 04/19/24 @ 1:00 pm Patient is aware

## 2024-04-19 NOTE — Progress Notes (Signed)
 HISTORY AND PHYSICAL     CC:  follow up. Requesting Provider:  Yolande Toribio MATSU, MD  HPI: This is a 78 y.o. female who is here today for follow up for PAD.  Pt has hx of  right CFA to peroneal artery bypass with PTFE and vein cuff on 11/22/2023 by Dr. Pearline for CLI with rest pain.  She subsequently underwent debridement of right lower leg wound with application of Kercis and wound vac on 01/10/2024 also by Dr. Pearline.   Pt was last seen 03/30/2024 and at that time, pt stated [facility] was wrapping her entire leg with calcium  alginate with coban for light compression.  Pt stated this left her leg wet when the dressing was off.  She felt the wounds were getting smaller.  She felt her toes were completely healed. She was painting them daily with betadine.  Orders were sent to [facility] for wet to dry dressing for medial leg wound only and santyl  to lateral leg wound followed by ace wrap for light compression.  She was scheduled for follow up.    HHRN called the office to say she was having some green drainage and needed to be seen.   The pt returns today for follow up.  She says there hasn't been much change in the wound on the lateral aspect.  The medial wound does continue to have clear drainage.  She feels her toes have improved.    She states she does not have any hx of afib.  She does state that she feels her heart race on occasion.  She is on Eliquis .   The pt is on a statin for cholesterol management.    The pt is not on an aspirin .    Other AC:  Eliquis  The pt is on CCB, ARB, diuretic for hypertension.  The pt is  on diabetic medication. Tobacco hx:  never   Past Medical History:  Diagnosis Date   Achalasia - Type II 05/08/2010   Qualifier: Diagnosis of  By: Avram MD, NOLIA Pitts E    Acute kidney injury (HCC) 02/26/2019   Candida esophagitis (HCC) 03/26/2014   Cataract    COVID-19 virus detected 02/25/2019   Diabetes mellitus    type 2   Family history of adverse reaction to anesthesia     son hard to wake up   GERD (gastroesophageal reflux disease)    Hyperlipidemia    Hypertension    Peripheral vascular disease (HCC)    Primary hypertension 02/25/2019   Syncope 02/25/2019   Trigger ring finger of right hand 10/07/2016    Past Surgical History:  Procedure Laterality Date   ABDOMINAL AORTOGRAM N/A 11/16/2023   Procedure: ABDOMINAL AORTOGRAM;  Surgeon: Lanis Fonda BRAVO, MD;  Location: [facility] INVASIVE CV LAB;  Service: Cardiovascular;  Laterality: N/A;   ABDOMINAL HYSTERECTOMY     APPLICATION OF WOUND VAC Right 01/10/2024   Procedure: APPLICATION, WOUND VAC;  Surgeon: Pearline Norman RAMAN, MD;  Location: [facility] OR;  Service: Vascular;  Laterality: Right;   APPLICATION, SKIN SUBSTITUTE Right 01/10/2024   Procedure: APPLICATION, SKIN SUBSTITUTE KERECIS 38 SQ CM;  Surgeon: Pearline Norman RAMAN, MD;  Location: [facility] OR;  Service: Vascular;  Laterality: Right;   BREAST BIOPSY     left/benign   BYPASS GRAFT FEMORAL-PERONEAL Right 11/22/2023   Procedure: CREATION, BYPASS, ARTERIAL, FEMORAL TO PERONEAL, USING PROPATEN X 80CM GRAFT;  Surgeon: Pearline Norman RAMAN, MD;  Location: [facility] OR;  Service: Vascular;  Laterality: Right;   COLONOSCOPY  01/31/2014   ESOPHAGEAL MANOMETRY N/A 04/01/2014   Procedure: ESOPHAGEAL MANOMETRY (EM);  Surgeon: Lupita FORBES Commander, MD;  Location: [facility] ENDOSCOPY;  Service: Endoscopy;  Laterality: N/A;   HELLER MYOTOMY N/A 12/07/2016   Procedure: LAPAROSCOPIC HELLER MYOTOMY, DOR FUNDOPLICATION, HIATAL HERNIA REPAIR, INTRAOPERATIVE UPPER ENDOSCOPY;  Surgeon: Lily Boas, MD;  Location: [facility] ORS;  Service: General;  Laterality: N/A;   INCISION AND DRAINAGE OF WOUND Right 01/10/2024   Procedure: IRRIGATION AND DEBRIDEMENT WOUND;  Surgeon: Pearline Norman RAMAN, MD;  Location: [facility] OR;  Service: Vascular;  Laterality: Right;   IR ANGIOGRAM PELVIS SELECTIVE OR SUPRASELECTIVE  10/20/2021   IR RADIOLOGIST EVAL & MGMT  10/01/2021   IR RADIOLOGIST EVAL & MGMT  11/25/2021   IR RADIOLOGIST EVAL &  MGMT  11/04/2022   IR TIB-PERO ART ATHEREC INC PTA MOD SED  10/15/2021   IR US  GUIDE VASC ACCESS RIGHT  10/15/2021   LOWER EXTREMITY ANGIOGRAPHY Right 11/16/2023   Procedure: Lower Extremity Angiography;  Surgeon: Lanis Fonda FORBES, MD;  Location: [facility] INVASIVE CV LAB;  Service: Cardiovascular;  Laterality: Right;   LOWER EXTREMITY INTERVENTION Right 11/16/2023   Procedure: LOWER EXTREMITY INTERVENTION;  Surgeon: Lanis Fonda FORBES, MD;  Location: [facility] INVASIVE CV LAB;  Service: Cardiovascular;  Laterality: Right;   PERIPHERAL VASCULAR THROMBECTOMY N/A 11/17/2023   Procedure: PERIPHERAL VASCULAR THROMBECTOMY;  Surgeon: Gretta Lonni PARAS, MD;  Location: [facility] INVASIVE CV LAB;  Service: Vascular;  Laterality: N/A;   TOTAL HIP ARTHROPLASTY     right pin then replacement   UPPER GASTROINTESTINAL ENDOSCOPY      Allergies  Allergen Reactions   Lovastatin Itching and Rash   Nitrofurantoin Rash    Current Outpatient Medications  Medication Sig Dispense Refill   acetaminophen  (TYLENOL ) 500 MG tablet Take 1,000 mg by mouth every 6 (six) hours as needed for moderate pain.     Alcohol  Swabs (B-D SINGLE USE SWABS REGULAR) PADS      amLODipine  (NORVASC ) 10 MG tablet Take 10 mg by mouth daily.     apixaban  (ELIQUIS ) 5 MG TABS tablet Take 1 tablet (5 mg total) by mouth 2 (two) times daily. 180 tablet 1   aspirin  81 MG chewable tablet Chew 81 mg by mouth daily.     Blood Glucose Monitoring Suppl (TRUE METRIX AIR GLUCOSE METER) w/Device KIT      cilostazol  (PLETAL ) 50 MG tablet Take 1 tablet (50 mg total) by mouth 2 (two) times daily. Patient must call and schedule annual appointment for further refills first attempt 60 tablet 0   collagenase  (SANTYL ) 250 UNIT/GM ointment Apply 1 Application topically daily. 30 g 1   ezetimibe  (ZETIA ) 10 MG tablet Take 1 tablet (10 mg total) by mouth every evening. 90 tablet 3   gabapentin  (NEURONTIN ) 100 MG capsule Take 1 capsule (100 mg total) by mouth 3 (three) times daily. 90  capsule 3   ibuprofen  (ADVIL ) 200 MG tablet Take 400 mg by mouth daily as needed for moderate pain.     insulin  aspart (NOVOLOG ) 100 UNIT/ML FlexPen Inject 10 Units into the skin 3 (three) times daily as needed for high blood sugar. Takes Novolog  10 units with meals (also takes a few extra units if glucose is elevated)     insulin  glargine (LANTUS ) 100 UNIT/ML injection Inject 19 Units into the skin daily.     Insulin  Syringe-Needle U-100 31G X 5/16 0.5 ML MISC Use one syringe twice daily DX:  250.70     KLOR-CON  M20 20 MEQ  tablet Take 20 mEq by mouth daily.  0   losartan -hydrochlorothiazide  (HYZAAR) 50-12.5 MG tablet Take 1 tablet by mouth every morning.     omeprazole (PRILOSEC) 20 MG capsule Take 20 mg by mouth daily as needed (acid reflux).     rosuvastatin  (CRESTOR ) 20 MG tablet TAKE 1 TABLET EVERY DAY (DISCONTINUE PRAVASTATIN ) 90 tablet 0   traMADol  (ULTRAM ) 50 MG tablet Take 1 tablet (50 mg total) by mouth every 6 (six) hours as needed. 20 tablet 0   TRUE METRIX BLOOD GLUCOSE TEST test strip      TRUEplus Lancets 33G MISC      No current facility-administered medications for this visit.    Family History  Problem Relation Age of Onset   Diabetes Mother        died at 69 from Covid-19   Hypertension Mother    Hyperlipidemia Mother    Breast cancer Mother    Diabetes Father    Hypertension Father    Hypertension Sister    Hypertension Sister    Colon polyps Daughter    Colon cancer Neg Hx    Esophageal cancer Neg Hx    Rectal cancer Neg Hx    Stomach cancer Neg Hx     Social History   Socioeconomic History   Marital status: Married    Spouse name: Not on file   Number of children: 8   Years of education: Not on file   Highest education level: Not on file  Occupational History   Not on file  Tobacco Use   Smoking status: Never   Smokeless tobacco: Never  Vaping Use   Vaping status: Never Used  Substance and Sexual Activity   Alcohol  use: No   Drug use: No    Sexual activity: Yes  Other Topics Concern   Not on file  Social History Narrative   Patient is married and retired   No alcohol  tobacco or drug use   Social Drivers of Corporate investment banker Strain: Not on file  Food Insecurity: No Food Insecurity (01/10/2024)   Hunger Vital Sign    Worried About Running Out of Food in the Last Year: Never true    Ran Out of Food in the Last Year: Never true  Transportation Needs: No Transportation Needs (01/10/2024)   PRAPARE - Administrator, Civil Service (Medical): No    Lack of Transportation (Non-Medical): No  Physical Activity: Not on file  Stress: Not on file  Social Connections: Socially Integrated (01/10/2024)   Social Connection and Isolation Panel    Frequency of Communication with Friends and Family: More than three times a week    Frequency of Social Gatherings with Friends and Family: More than three times a week    Attends Religious Services: More than 4 times per year    Active Member of [employer] or Organizations: Yes    Attends Engineer, structural: More than 4 times per year    Marital Status: Married  Catering manager Violence: Not At Risk (01/11/2024)   Humiliation, Afraid, Rape, and Kick questionnaire    Fear of Current or Ex-Partner: No    Emotionally Abused: No    Physically Abused: No    Sexually Abused: No     REVIEW OF SYSTEMS:   [X]  denotes positive finding, [ ]  denotes negative finding Cardiac  Comments:  Chest pain or chest pressure:    Shortness of breath upon exertion:    Short of breath  when lying flat:    Irregular heart rhythm:        Vascular    Pain in calf, thigh, or hip brought on by ambulation:    Pain in feet at night that wakes you up from your sleep:     Blood clot in your veins:    Leg swelling:         Pulmonary    Oxygen at home:    Productive cough:     Wheezing:         Neurologic    Sudden weakness in arms or legs:     Sudden numbness in arms or legs:      Sudden onset of difficulty speaking or slurred speech:    Temporary loss of vision in one eye:     Problems with dizziness:         Gastrointestinal    Blood in stool:     Vomited blood:         Genitourinary    Burning when urinating:     Blood in urine:        Psychiatric    Major depression:         Hematologic    Bleeding problems:    Problems with blood clotting too easily:        Skin    Rashes or ulcers: x       Constitutional    Fever or chills:      PHYSICAL EXAMINATION:  Today's Vitals   04/19/24 1236  BP: 134/64  Pulse: (!) 101  Temp: 97.7 F (36.5 C)  TempSrc: Temporal  Weight: 175 lb 1.6 oz (79.4 kg)  PainSc: 0-No pain   Body mass index is 29.14 kg/m.   General:  WDWN in NAD; vital signs documented above Gait: Not observed HENT: WNL, normocephalic Pulmonary: normal non-labored breathing , without wheezing Cardiac: irregular HR Skin: without rashes Vascular Exam/Pulses: Brisk peroneal and DP doppler flow right foot Extremities:      Musculoskeletal: no muscle wasting or atrophy  Neurologic: A&O X 3 Psychiatric:  The pt has Normal affect.   Non-Invasive Vascular Imaging:   None today    ASSESSMENT/PLAN:: 78 y.o. female here for follow up for PAD with hx of  right CFA to peroneal artery bypass with PTFE and vein cuff on 11/22/2023 by Dr. Pearline for CLI with rest pain.  She subsequently underwent debridement of right lower leg wound with application of Kercis and wound vac on 01/10/2024 also by Dr. Pearline.   PAD/wounds -lateral leg wound does not appear infected and has not really changed.  This is painful for the pt.  Discussed with Dr. Lanis and will put in referral to the wound care center.  Until seen by them, continue wet to dry dressings to medial and lateral wounds.  The medial wound is healthy granulation tissue. -she is okay to shower with soap and water and reapply dressing.  -continue asa/statin  Irregular rhythm -pt seen  today and rhythm is irregular and HR 101.  She states she can feel her heart racing at times but not bothered by this.  She is not on a beta blocker but is on Eliquis . -we did get her an appt with cardiology today to get an EKG and further evaluation.    Lucie Apt, [facility] [phone]  Clinic MD:   Lanis

## 2024-04-20 ENCOUNTER — Telehealth: Payer: Self-pay

## 2024-04-20 NOTE — Telephone Encounter (Signed)
 Writer called [facility] to give verbal order to continue wet to dry dressings on both medial and lateral right lower leg wound.  The patient will be seen 2x a week until she is picked up by the wound care center.  Jenna verbalized understanding of order.

## 2024-04-25 NOTE — Progress Notes (Addendum)
 Catherine Ramsey                                          MRN: [id number]   07/20/2024   The [facility] Quality Team Specialist reviewed this patient medical record for the purposes of chart review for care gap closure. The following were reviewed: abstraction for care gap closure-controlling blood pressure.    [facility] Quality Team

## 2024-04-26 ENCOUNTER — Encounter (HOSPITAL_BASED_OUTPATIENT_CLINIC_OR_DEPARTMENT_OTHER): Attending: General Surgery | Admitting: General Surgery

## 2024-04-26 DIAGNOSIS — L97812 Non-pressure chronic ulcer of other part of right lower leg with fat layer exposed: Secondary | ICD-10-CM | POA: Diagnosis not present

## 2024-04-26 DIAGNOSIS — S81801A Unspecified open wound, right lower leg, initial encounter: Secondary | ICD-10-CM | POA: Diagnosis not present

## 2024-04-26 DIAGNOSIS — E11622 Type 2 diabetes mellitus with other skin ulcer: Secondary | ICD-10-CM | POA: Diagnosis not present

## 2024-04-26 DIAGNOSIS — E1151 Type 2 diabetes mellitus with diabetic peripheral angiopathy without gangrene: Secondary | ICD-10-CM | POA: Diagnosis not present

## 2024-04-26 DIAGNOSIS — T8189XA Other complications of procedures, not elsewhere classified, initial encounter: Secondary | ICD-10-CM | POA: Diagnosis not present

## 2024-04-26 DIAGNOSIS — I739 Peripheral vascular disease, unspecified: Secondary | ICD-10-CM | POA: Diagnosis not present

## 2024-04-26 DIAGNOSIS — T8131XA Disruption of external operation (surgical) wound, not elsewhere classified, initial encounter: Secondary | ICD-10-CM | POA: Diagnosis not present

## 2024-04-26 DIAGNOSIS — I872 Venous insufficiency (chronic) (peripheral): Secondary | ICD-10-CM | POA: Diagnosis not present

## 2024-05-01 ENCOUNTER — Ambulatory Visit (HOSPITAL_BASED_OUTPATIENT_CLINIC_OR_DEPARTMENT_OTHER): Admitting: Internal Medicine

## 2024-05-01 DIAGNOSIS — I872 Venous insufficiency (chronic) (peripheral): Secondary | ICD-10-CM | POA: Diagnosis not present

## 2024-05-01 DIAGNOSIS — E11622 Type 2 diabetes mellitus with other skin ulcer: Secondary | ICD-10-CM | POA: Diagnosis not present

## 2024-05-01 DIAGNOSIS — L97812 Non-pressure chronic ulcer of other part of right lower leg with fat layer exposed: Secondary | ICD-10-CM | POA: Diagnosis not present

## 2024-05-01 DIAGNOSIS — T8131XA Disruption of external operation (surgical) wound, not elsewhere classified, initial encounter: Secondary | ICD-10-CM | POA: Diagnosis not present

## 2024-05-03 DIAGNOSIS — S81801A Unspecified open wound, right lower leg, initial encounter: Secondary | ICD-10-CM | POA: Diagnosis not present

## 2024-05-03 DIAGNOSIS — T8189XA Other complications of procedures, not elsewhere classified, initial encounter: Secondary | ICD-10-CM | POA: Diagnosis not present

## 2024-05-03 DIAGNOSIS — I872 Venous insufficiency (chronic) (peripheral): Secondary | ICD-10-CM | POA: Diagnosis not present

## 2024-05-04 ENCOUNTER — Ambulatory Visit: Attending: Vascular Surgery | Admitting: Physician Assistant

## 2024-05-04 ENCOUNTER — Encounter: Payer: Self-pay | Admitting: Physician Assistant

## 2024-05-04 ENCOUNTER — Telehealth: Payer: Self-pay

## 2024-05-04 VITALS — BP 125/66 | HR 79 | Temp 98.2°F | Wt 175.0 lb

## 2024-05-04 DIAGNOSIS — S81801S Unspecified open wound, right lower leg, sequela: Secondary | ICD-10-CM | POA: Diagnosis not present

## 2024-05-04 NOTE — Progress Notes (Signed)
 POST OPERATIVE OFFICE NOTE    CC:  F/u for surgery  HPI:  This is a 78 y.o. female who is s/pPt has hx of  right CFA to peroneal artery bypass with PTFE and vein cuff on 11/22/2023 by Dr. Pearline for CLI with rest pain.  She subsequently underwent debridement of right lower leg wound with application of Kercis and wound vac on 01/10/2024 also by Dr. Pearline.   Pt had been seen on 03/30/2024  and at that time, pt stated [facility] was wrapping her entire leg with calcium  alginate with coban for light compression.  Pt stated this left her leg wet when the dressing was off.  She felt the wounds were getting smaller.  She felt her toes were completely healed. She was painting them daily with betadine.  Orders were sent to [facility] for wet to dry dressing for medial leg wound only and santyl  to lateral leg wound followed by ace wrap for light compression.  She was scheduled for follow up.    She was last seen on 04/19/2024 and she felt there hadn't been much change and was having clear drainage from the medial wound.  She felt her toes had improved.  She was tachycardic and irregular and she was sent up to cardiology.  She was not in afib. She is on on Eliquis .  She was referred to the wound care clinic.   Pt returns today for follow up.  Pt states she feels her wounds are getting smaller.  Her toe wounds remained healed.  She states that she has been going to the wound care center and they have debrided the wounds.  She denies any pain in the left foot.     Allergies  Allergen Reactions   Lovastatin Itching and Rash   Nitrofurantoin Rash    Current Outpatient Medications  Medication Sig Dispense Refill   acetaminophen  (TYLENOL ) 500 MG tablet Take 1,000 mg by mouth every 6 (six) hours as needed for moderate pain.     Alcohol  Swabs (B-D SINGLE USE SWABS REGULAR) PADS      amLODipine  (NORVASC ) 10 MG tablet Take 10 mg by mouth daily.     apixaban  (ELIQUIS ) 5 MG TABS tablet Take 1 tablet (5 mg total) by mouth 2  (two) times daily. 180 tablet 1   aspirin  81 MG chewable tablet Chew 81 mg by mouth daily.     Blood Glucose Monitoring Suppl (TRUE METRIX AIR GLUCOSE METER) w/Device KIT      cilostazol  (PLETAL ) 50 MG tablet Take 1 tablet (50 mg total) by mouth 2 (two) times daily. Patient must call and schedule annual appointment for further refills first attempt 60 tablet 0   collagenase  (SANTYL ) 250 UNIT/GM ointment Apply 1 Application topically daily. 30 g 1   ezetimibe  (ZETIA ) 10 MG tablet Take 1 tablet (10 mg total) by mouth every evening. 90 tablet 3   gabapentin  (NEURONTIN ) 100 MG capsule Take 1 capsule (100 mg total) by mouth 3 (three) times daily. 90 capsule 3   ibuprofen  (ADVIL ) 200 MG tablet Take 400 mg by mouth daily as needed for moderate pain.     insulin  aspart (NOVOLOG ) 100 UNIT/ML FlexPen Inject 10 Units into the skin 3 (three) times daily as needed for high blood sugar. Takes Novolog  10 units with meals (also takes a few extra units if glucose is elevated)     insulin  glargine (LANTUS ) 100 UNIT/ML injection Inject 19 Units into the skin daily.     Insulin  Syringe-Needle  U-100 31G X 5/16 0.5 ML MISC Use one syringe twice daily DX:  250.70     KLOR-CON  M20 20 MEQ tablet Take 20 mEq by mouth daily.  0   losartan -hydrochlorothiazide  (HYZAAR) 50-12.5 MG tablet Take 1 tablet by mouth every morning.     omeprazole (PRILOSEC) 20 MG capsule Take 20 mg by mouth daily as needed (acid reflux).     rosuvastatin  (CRESTOR ) 20 MG tablet TAKE 1 TABLET EVERY DAY (DISCONTINUE PRAVASTATIN ) 90 tablet 0   traMADol  (ULTRAM ) 50 MG tablet Take 1 tablet (50 mg total) by mouth every 6 (six) hours as needed. 20 tablet 0   TRUE METRIX BLOOD GLUCOSE TEST test strip      TRUEplus Lancets 33G MISC      No current facility-administered medications for this visit.     ROS:  See HPI  Physical Exam:  Today's Vitals   05/04/24 1428  BP: 125/66  Pulse: 79  Temp: 98.2 F (36.8 C)  SpO2: 98%  Weight: 175 lb (79.4 kg)    Body mass index is 27.41 kg/m.    Extremities:  brisk doppler flow right DP/pero>PT Right medial wound  Right lateral wound       Assessment/Plan:  This is a 78 y.o. female who is s/p: right CFA to peroneal artery bypass with PTFE and vein cuff on 11/22/2023 by Dr. Pearline for CLI with rest pain.  She subsequently underwent debridement of right lower leg wound with application of Kercis and wound vac on 01/10/2024 also by Dr. Pearline.   -brisk doppler flow right foot; wounds on right lower leg improving.  The medial wound is smaller than at her last visit.  The lateral wound has cleaned up nicely and improved from when I saw her last.   -continue with wound care center and we will see her back in a couple of months.  Once her wounds are healed, we will get RLE arterial duplex and ABI.   -I did give them some supplies to do wet to dry dressings daily until they return to the wound center on Tuesday.   -continue asa/Crestor    Lucie Apt, [facility] [phone]   Clinic MD:  pt seen with Dr. Pearline

## 2024-05-04 NOTE — Telephone Encounter (Signed)
 Jenna, RN [phone] [facility] called to let [facility] know that she would not be seeing the patient today since she has an appt with [facility] today, 05/04/24.

## 2024-05-08 ENCOUNTER — Encounter (HOSPITAL_BASED_OUTPATIENT_CLINIC_OR_DEPARTMENT_OTHER): Attending: General Surgery | Admitting: General Surgery

## 2024-05-08 DIAGNOSIS — E11622 Type 2 diabetes mellitus with other skin ulcer: Secondary | ICD-10-CM | POA: Insufficient documentation

## 2024-05-08 DIAGNOSIS — N183 Chronic kidney disease, stage 3 unspecified: Secondary | ICD-10-CM | POA: Insufficient documentation

## 2024-05-08 DIAGNOSIS — E1122 Type 2 diabetes mellitus with diabetic chronic kidney disease: Secondary | ICD-10-CM | POA: Diagnosis not present

## 2024-05-08 DIAGNOSIS — M71022 Abscess of bursa, left elbow: Secondary | ICD-10-CM | POA: Diagnosis not present

## 2024-05-08 DIAGNOSIS — T8131XA Disruption of external operation (surgical) wound, not elsewhere classified, initial encounter: Secondary | ICD-10-CM | POA: Diagnosis not present

## 2024-05-08 DIAGNOSIS — L98492 Non-pressure chronic ulcer of skin of other sites with fat layer exposed: Secondary | ICD-10-CM | POA: Diagnosis not present

## 2024-05-08 DIAGNOSIS — Z7901 Long term (current) use of anticoagulants: Secondary | ICD-10-CM | POA: Diagnosis not present

## 2024-05-08 DIAGNOSIS — E1151 Type 2 diabetes mellitus with diabetic peripheral angiopathy without gangrene: Secondary | ICD-10-CM | POA: Diagnosis not present

## 2024-05-08 DIAGNOSIS — L97812 Non-pressure chronic ulcer of other part of right lower leg with fat layer exposed: Secondary | ICD-10-CM | POA: Insufficient documentation

## 2024-05-08 DIAGNOSIS — L02414 Cutaneous abscess of left upper limb: Secondary | ICD-10-CM | POA: Insufficient documentation

## 2024-05-08 DIAGNOSIS — I872 Venous insufficiency (chronic) (peripheral): Secondary | ICD-10-CM | POA: Diagnosis not present

## 2024-05-08 DIAGNOSIS — Z794 Long term (current) use of insulin: Secondary | ICD-10-CM | POA: Insufficient documentation

## 2024-05-12 DIAGNOSIS — S81801A Unspecified open wound, right lower leg, initial encounter: Secondary | ICD-10-CM | POA: Diagnosis not present

## 2024-05-12 DIAGNOSIS — T8189XA Other complications of procedures, not elsewhere classified, initial encounter: Secondary | ICD-10-CM | POA: Diagnosis not present

## 2024-05-12 DIAGNOSIS — I872 Venous insufficiency (chronic) (peripheral): Secondary | ICD-10-CM | POA: Diagnosis not present

## 2024-05-15 ENCOUNTER — Encounter (HOSPITAL_BASED_OUTPATIENT_CLINIC_OR_DEPARTMENT_OTHER): Admitting: General Surgery

## 2024-05-15 DIAGNOSIS — E11622 Type 2 diabetes mellitus with other skin ulcer: Secondary | ICD-10-CM | POA: Diagnosis not present

## 2024-05-15 DIAGNOSIS — M7022 Olecranon bursitis, left elbow: Secondary | ICD-10-CM | POA: Diagnosis not present

## 2024-05-15 DIAGNOSIS — L97812 Non-pressure chronic ulcer of other part of right lower leg with fat layer exposed: Secondary | ICD-10-CM | POA: Diagnosis not present

## 2024-05-15 DIAGNOSIS — E1151 Type 2 diabetes mellitus with diabetic peripheral angiopathy without gangrene: Secondary | ICD-10-CM | POA: Diagnosis not present

## 2024-05-15 DIAGNOSIS — I872 Venous insufficiency (chronic) (peripheral): Secondary | ICD-10-CM | POA: Diagnosis not present

## 2024-05-15 DIAGNOSIS — E785 Hyperlipidemia, unspecified: Secondary | ICD-10-CM | POA: Diagnosis not present

## 2024-05-15 DIAGNOSIS — M71022 Abscess of bursa, left elbow: Secondary | ICD-10-CM | POA: Diagnosis not present

## 2024-05-15 DIAGNOSIS — K219 Gastro-esophageal reflux disease without esophagitis: Secondary | ICD-10-CM | POA: Diagnosis not present

## 2024-05-15 DIAGNOSIS — I739 Peripheral vascular disease, unspecified: Secondary | ICD-10-CM | POA: Diagnosis not present

## 2024-05-15 DIAGNOSIS — T8131XA Disruption of external operation (surgical) wound, not elsewhere classified, initial encounter: Secondary | ICD-10-CM | POA: Diagnosis not present

## 2024-05-15 DIAGNOSIS — I10 Essential (primary) hypertension: Secondary | ICD-10-CM | POA: Diagnosis not present

## 2024-05-16 DIAGNOSIS — S81801A Unspecified open wound, right lower leg, initial encounter: Secondary | ICD-10-CM | POA: Diagnosis not present

## 2024-05-16 DIAGNOSIS — I872 Venous insufficiency (chronic) (peripheral): Secondary | ICD-10-CM | POA: Diagnosis not present

## 2024-05-16 DIAGNOSIS — T8189XA Other complications of procedures, not elsewhere classified, initial encounter: Secondary | ICD-10-CM | POA: Diagnosis not present

## 2024-05-22 ENCOUNTER — Encounter (HOSPITAL_BASED_OUTPATIENT_CLINIC_OR_DEPARTMENT_OTHER): Admitting: General Surgery

## 2024-05-22 DIAGNOSIS — E11622 Type 2 diabetes mellitus with other skin ulcer: Secondary | ICD-10-CM | POA: Diagnosis not present

## 2024-05-22 DIAGNOSIS — M71022 Abscess of bursa, left elbow: Secondary | ICD-10-CM | POA: Diagnosis not present

## 2024-05-22 DIAGNOSIS — I872 Venous insufficiency (chronic) (peripheral): Secondary | ICD-10-CM | POA: Diagnosis not present

## 2024-05-22 DIAGNOSIS — T8131XA Disruption of external operation (surgical) wound, not elsewhere classified, initial encounter: Secondary | ICD-10-CM | POA: Diagnosis not present

## 2024-05-22 DIAGNOSIS — L97812 Non-pressure chronic ulcer of other part of right lower leg with fat layer exposed: Secondary | ICD-10-CM | POA: Diagnosis not present

## 2024-05-29 ENCOUNTER — Encounter (HOSPITAL_BASED_OUTPATIENT_CLINIC_OR_DEPARTMENT_OTHER): Admitting: General Surgery

## 2024-05-29 DIAGNOSIS — T8131XA Disruption of external operation (surgical) wound, not elsewhere classified, initial encounter: Secondary | ICD-10-CM | POA: Diagnosis not present

## 2024-05-29 DIAGNOSIS — E11622 Type 2 diabetes mellitus with other skin ulcer: Secondary | ICD-10-CM | POA: Diagnosis not present

## 2024-05-29 DIAGNOSIS — L97812 Non-pressure chronic ulcer of other part of right lower leg with fat layer exposed: Secondary | ICD-10-CM | POA: Diagnosis not present

## 2024-05-29 DIAGNOSIS — L98492 Non-pressure chronic ulcer of skin of other sites with fat layer exposed: Secondary | ICD-10-CM | POA: Diagnosis not present

## 2024-05-29 DIAGNOSIS — I872 Venous insufficiency (chronic) (peripheral): Secondary | ICD-10-CM | POA: Diagnosis not present

## 2024-05-31 DIAGNOSIS — T8189XA Other complications of procedures, not elsewhere classified, initial encounter: Secondary | ICD-10-CM | POA: Diagnosis not present

## 2024-05-31 DIAGNOSIS — S81801A Unspecified open wound, right lower leg, initial encounter: Secondary | ICD-10-CM | POA: Diagnosis not present

## 2024-05-31 DIAGNOSIS — I872 Venous insufficiency (chronic) (peripheral): Secondary | ICD-10-CM | POA: Diagnosis not present

## 2024-06-04 DIAGNOSIS — T8189XA Other complications of procedures, not elsewhere classified, initial encounter: Secondary | ICD-10-CM | POA: Diagnosis not present

## 2024-06-04 DIAGNOSIS — S81801A Unspecified open wound, right lower leg, initial encounter: Secondary | ICD-10-CM | POA: Diagnosis not present

## 2024-06-04 DIAGNOSIS — S51802A Unspecified open wound of left forearm, initial encounter: Secondary | ICD-10-CM | POA: Diagnosis not present

## 2024-06-05 ENCOUNTER — Encounter (HOSPITAL_BASED_OUTPATIENT_CLINIC_OR_DEPARTMENT_OTHER): Attending: General Surgery | Admitting: General Surgery

## 2024-06-05 DIAGNOSIS — L97812 Non-pressure chronic ulcer of other part of right lower leg with fat layer exposed: Secondary | ICD-10-CM | POA: Insufficient documentation

## 2024-06-05 DIAGNOSIS — E11622 Type 2 diabetes mellitus with other skin ulcer: Secondary | ICD-10-CM | POA: Insufficient documentation

## 2024-06-05 DIAGNOSIS — M71022 Abscess of bursa, left elbow: Secondary | ICD-10-CM | POA: Diagnosis not present

## 2024-06-05 DIAGNOSIS — I872 Venous insufficiency (chronic) (peripheral): Secondary | ICD-10-CM | POA: Diagnosis not present

## 2024-06-05 DIAGNOSIS — E1151 Type 2 diabetes mellitus with diabetic peripheral angiopathy without gangrene: Secondary | ICD-10-CM | POA: Insufficient documentation

## 2024-06-05 DIAGNOSIS — T8131XA Disruption of external operation (surgical) wound, not elsewhere classified, initial encounter: Secondary | ICD-10-CM | POA: Diagnosis not present

## 2024-06-13 ENCOUNTER — Encounter (HOSPITAL_BASED_OUTPATIENT_CLINIC_OR_DEPARTMENT_OTHER): Admitting: General Surgery

## 2024-06-13 DIAGNOSIS — T8131XA Disruption of external operation (surgical) wound, not elsewhere classified, initial encounter: Secondary | ICD-10-CM | POA: Diagnosis not present

## 2024-06-13 DIAGNOSIS — L97812 Non-pressure chronic ulcer of other part of right lower leg with fat layer exposed: Secondary | ICD-10-CM | POA: Diagnosis not present

## 2024-06-13 DIAGNOSIS — E11622 Type 2 diabetes mellitus with other skin ulcer: Secondary | ICD-10-CM | POA: Diagnosis not present

## 2024-06-13 DIAGNOSIS — I872 Venous insufficiency (chronic) (peripheral): Secondary | ICD-10-CM | POA: Diagnosis not present

## 2024-06-19 ENCOUNTER — Encounter (HOSPITAL_BASED_OUTPATIENT_CLINIC_OR_DEPARTMENT_OTHER): Admitting: General Surgery

## 2024-06-19 DIAGNOSIS — E11622 Type 2 diabetes mellitus with other skin ulcer: Secondary | ICD-10-CM | POA: Diagnosis not present

## 2024-06-19 DIAGNOSIS — L97812 Non-pressure chronic ulcer of other part of right lower leg with fat layer exposed: Secondary | ICD-10-CM | POA: Diagnosis not present

## 2024-06-19 DIAGNOSIS — I872 Venous insufficiency (chronic) (peripheral): Secondary | ICD-10-CM | POA: Diagnosis not present

## 2024-06-19 DIAGNOSIS — T8131XA Disruption of external operation (surgical) wound, not elsewhere classified, initial encounter: Secondary | ICD-10-CM | POA: Diagnosis not present

## 2024-07-03 ENCOUNTER — Encounter (HOSPITAL_BASED_OUTPATIENT_CLINIC_OR_DEPARTMENT_OTHER): Attending: General Surgery | Admitting: General Surgery

## 2024-07-03 DIAGNOSIS — I872 Venous insufficiency (chronic) (peripheral): Secondary | ICD-10-CM | POA: Diagnosis not present

## 2024-07-03 DIAGNOSIS — L97812 Non-pressure chronic ulcer of other part of right lower leg with fat layer exposed: Secondary | ICD-10-CM | POA: Diagnosis not present

## 2024-07-03 DIAGNOSIS — T8131XA Disruption of external operation (surgical) wound, not elsewhere classified, initial encounter: Secondary | ICD-10-CM | POA: Diagnosis not present

## 2024-07-03 DIAGNOSIS — E1151 Type 2 diabetes mellitus with diabetic peripheral angiopathy without gangrene: Secondary | ICD-10-CM | POA: Diagnosis not present

## 2024-07-03 DIAGNOSIS — E11622 Type 2 diabetes mellitus with other skin ulcer: Secondary | ICD-10-CM | POA: Insufficient documentation

## 2024-07-05 NOTE — Progress Notes (Signed)
 Office Note     CC:  follow up Requesting Provider:  Yolande Toribio MATSU, MD  HPI: Catherine Ramsey is a 78 y.o. (07-10-1946) female who presents for follow up wound check. She has history of right CFA to peroneal artery bypass with PTFE and vein cuff on 11/22/2023 by Dr. Pearline for CLI with rest pain. She subsequently underwent debridement of right lower leg wound with application of Kercis and wound vac on 01/10/2024 also by Dr. Pearline. At her last visit her wounds were still open but improving [facility].   Her wounds are managed by Dr. Marolyn at the [facility]. She reports she feels wound are improving. She denies any pain on ambulation or rest. Just some discomfort with the compression wrap on her right leg. She has been having some swelling on left leg but no pain in it either. She is medically managed on Aspirin , statin, Eliquis .  Past Medical History:  Diagnosis Date   Achalasia - Type II 05/08/2010   Qualifier: Diagnosis of  By: Avram MD, NOLIA Pitts E    Acute kidney injury 02/26/2019   Candida esophagitis (HCC) 03/26/2014   Cataract    COVID-19 virus detected 02/25/2019   Diabetes mellitus    type 2   Family history of adverse reaction to anesthesia    son hard to wake up   GERD (gastroesophageal reflux disease)    Hyperlipidemia    Hypertension    Peripheral vascular disease    Primary hypertension 02/25/2019   Syncope 02/25/2019   Trigger ring finger of right hand 10/07/2016    Past Surgical History:  Procedure Laterality Date   ABDOMINAL AORTOGRAM N/A 11/16/2023   Procedure: ABDOMINAL AORTOGRAM;  Surgeon: Lanis Fonda BRAVO, MD;  Location: [facility] INVASIVE CV LAB;  Service: Cardiovascular;  Laterality: N/A;   ABDOMINAL HYSTERECTOMY     APPLICATION OF WOUND VAC Right 01/10/2024   Procedure: APPLICATION, WOUND VAC;  Surgeon: Pearline Norman RAMAN, MD;  Location: [facility] OR;  Service: Vascular;  Laterality: Right;   APPLICATION, SKIN SUBSTITUTE  Right 01/10/2024   Procedure: APPLICATION, SKIN SUBSTITUTE KERECIS 38 SQ CM;  Surgeon: Pearline Norman RAMAN, MD;  Location: [facility] OR;  Service: Vascular;  Laterality: Right;   BREAST BIOPSY     left/benign   BYPASS GRAFT FEMORAL-PERONEAL Right 11/22/2023   Procedure: CREATION, BYPASS, ARTERIAL, FEMORAL TO PERONEAL, USING PROPATEN X 80CM GRAFT;  Surgeon: Pearline Norman RAMAN, MD;  Location: [facility] OR;  Service: Vascular;  Laterality: Right;   COLONOSCOPY  01/31/2014   ESOPHAGEAL MANOMETRY N/A 04/01/2014   Procedure: ESOPHAGEAL MANOMETRY (EM);  Surgeon: Pitts BRAVO Avram, MD;  Location: [facility] ENDOSCOPY;  Service: Endoscopy;  Laterality: N/A;   HELLER MYOTOMY N/A 12/07/2016   Procedure: LAPAROSCOPIC HELLER MYOTOMY, DOR FUNDOPLICATION, HIATAL HERNIA REPAIR, INTRAOPERATIVE UPPER ENDOSCOPY;  Surgeon: Lily Boas, MD;  Location: [facility] ORS;  Service: General;  Laterality: N/A;   INCISION AND DRAINAGE OF WOUND Right 01/10/2024   Procedure: IRRIGATION AND DEBRIDEMENT WOUND;  Surgeon: Pearline Norman RAMAN, MD;  Location: [facility] OR;  Service: Vascular;  Laterality: Right;   IR ANGIOGRAM PELVIS SELECTIVE OR SUPRASELECTIVE  10/20/2021   IR RADIOLOGIST EVAL & MGMT  10/01/2021   IR RADIOLOGIST EVAL & MGMT  11/25/2021   IR RADIOLOGIST EVAL & MGMT  11/04/2022   IR TIB-PERO ART ATHEREC INC PTA MOD SED  10/15/2021   IR US  GUIDE VASC ACCESS RIGHT  10/15/2021   LOWER EXTREMITY ANGIOGRAPHY Right 11/16/2023  Procedure: Lower Extremity Angiography;  Surgeon: Lanis Fonda BRAVO, MD;  Location: [facility] INVASIVE CV LAB;  Service: Cardiovascular;  Laterality: Right;   LOWER EXTREMITY INTERVENTION Right 11/16/2023   Procedure: LOWER EXTREMITY INTERVENTION;  Surgeon: Lanis Fonda BRAVO, MD;  Location: [facility] INVASIVE CV LAB;  Service: Cardiovascular;  Laterality: Right;   PERIPHERAL VASCULAR THROMBECTOMY N/A 11/17/2023   Procedure: PERIPHERAL VASCULAR THROMBECTOMY;  Surgeon: Gretta Lonni PARAS, MD;  Location: [facility] INVASIVE CV LAB;  Service: Vascular;  Laterality:  N/A;   TOTAL HIP ARTHROPLASTY     right pin then replacement   UPPER GASTROINTESTINAL ENDOSCOPY      Social History   Socioeconomic History   Marital status: Married    Spouse name: Not on file   Number of children: 8   Years of education: Not on file   Highest education level: Not on file  Occupational History   Not on file  Tobacco Use   Smoking status: Never   Smokeless tobacco: Never  Vaping Use   Vaping status: Never Used  Substance and Sexual Activity   Alcohol  use: No   Drug use: No   Sexual activity: Yes  Other Topics Concern   Not on file  Social History Narrative   Patient is married and retired   No alcohol  tobacco or drug use   Social Drivers of Corporate Investment Banker Strain: Not on file  Food Insecurity: No Food Insecurity (01/10/2024)   Hunger Vital Sign    Worried About Running Out of Food in the Last Year: Never true    Ran Out of Food in the Last Year: Never true  Transportation Needs: No Transportation Needs (01/10/2024)   PRAPARE - Administrator, Civil Service (Medical): No    Lack of Transportation (Non-Medical): No  Physical Activity: Not on file  Stress: Not on file  Social Connections: Socially Integrated (01/10/2024)   Social Connection and Isolation Panel    Frequency of Communication with Friends and Family: More than three times a week    Frequency of Social Gatherings with Friends and Family: More than three times a week    Attends Religious Services: More than 4 times per year    Active Member of [employer] or Organizations: Yes    Attends Engineer, Structural: More than 4 times per year    Marital Status: Married  Catering Manager Violence: Not At Risk (01/11/2024)   Humiliation, Afraid, Rape, and Kick questionnaire    Fear of Current or Ex-Partner: No    Emotionally Abused: No    Physically Abused: No    Sexually Abused: No    Family History  Problem Relation Age of Onset   Diabetes Mother        died at 93  from Covid-19   Hypertension Mother    Hyperlipidemia Mother    Breast cancer Mother    Diabetes Father    Hypertension Father    Hypertension Sister    Hypertension Sister    Colon polyps Daughter    Colon cancer Neg Hx    Esophageal cancer Neg Hx    Rectal cancer Neg Hx    Stomach cancer Neg Hx     Current Outpatient Medications  Medication Sig Dispense Refill   acetaminophen  (TYLENOL ) 500 MG tablet Take 1,000 mg by mouth every 6 (six) hours as needed for moderate pain.     Alcohol  Swabs (B-D SINGLE USE SWABS REGULAR) PADS      amLODipine  (NORVASC )  10 MG tablet Take 10 mg by mouth daily.     apixaban  (ELIQUIS ) 5 MG TABS tablet Take 1 tablet (5 mg total) by mouth 2 (two) times daily. 180 tablet 1   aspirin  81 MG chewable tablet Chew 81 mg by mouth daily.     Blood Glucose Monitoring Suppl (TRUE METRIX AIR GLUCOSE METER) w/Device KIT      cilostazol  (PLETAL ) 50 MG tablet Take 1 tablet (50 mg total) by mouth 2 (two) times daily. Patient must call and schedule annual appointment for further refills first attempt 60 tablet 0   collagenase  (SANTYL ) 250 UNIT/GM ointment Apply 1 Application topically daily. 30 g 1   ezetimibe  (ZETIA ) 10 MG tablet Take 1 tablet (10 mg total) by mouth every evening. 90 tablet 3   gabapentin  (NEURONTIN ) 100 MG capsule Take 1 capsule (100 mg total) by mouth 3 (three) times daily. 90 capsule 3   ibuprofen  (ADVIL ) 200 MG tablet Take 400 mg by mouth daily as needed for moderate pain.     insulin  aspart (NOVOLOG ) 100 UNIT/ML FlexPen Inject 10 Units into the skin 3 (three) times daily as needed for high blood sugar. Takes Novolog  10 units with meals (also takes a few extra units if glucose is elevated)     insulin  glargine (LANTUS ) 100 UNIT/ML injection Inject 19 Units into the skin daily.     Insulin  Syringe-Needle U-100 31G X 5/16 0.5 ML MISC Use one syringe twice daily DX:  250.70     KLOR-CON  M20 20 MEQ tablet Take 20 mEq by mouth daily.  0    losartan -hydrochlorothiazide  (HYZAAR) 50-12.5 MG tablet Take 1 tablet by mouth every morning.     omeprazole (PRILOSEC) 20 MG capsule Take 20 mg by mouth daily as needed (acid reflux).     rosuvastatin  (CRESTOR ) 20 MG tablet TAKE 1 TABLET EVERY DAY (DISCONTINUE PRAVASTATIN ) 90 tablet 0   traMADol  (ULTRAM ) 50 MG tablet Take 1 tablet (50 mg total) by mouth every 6 (six) hours as needed. 20 tablet 0   TRUE METRIX BLOOD GLUCOSE TEST test strip      TRUEplus Lancets 33G MISC      No current facility-administered medications for this visit.    Allergies  Allergen Reactions   Lovastatin Itching and Rash   Nitrofurantoin Rash     REVIEW OF SYSTEMS:  Negative unless noted in HPI [X]  denotes positive finding, [ ]  denotes negative finding Cardiac  Comments:  Chest pain or chest pressure:    Shortness of breath upon exertion:    Short of breath when lying flat:    Irregular heart rhythm:        Vascular    Pain in calf, thigh, or hip brought on by ambulation:    Pain in feet at night that wakes you up from your sleep:     Blood clot in your veins:    Leg swelling:         Pulmonary    Oxygen at home:    Productive cough:     Wheezing:         Neurologic    Sudden weakness in arms or legs:     Sudden numbness in arms or legs:     Sudden onset of difficulty speaking or slurred speech:    Temporary loss of vision in one eye:     Problems with dizziness:         Gastrointestinal    Blood in stool:  Vomited blood:         Genitourinary    Burning when urinating:     Blood in urine:        Psychiatric    Major depression:         Hematologic    Bleeding problems:    Problems with blood clotting too easily:        Skin    Rashes or ulcers:        Constitutional    Fever or chills:      PHYSICAL EXAMINATION:  Vitals:   07/06/24 1013  BP: (!) 151/67  Pulse: (!) 101  Temp: 97.7 F (36.5 C)  TempSrc: Temporal  Weight: 186 lb 4.8 oz (84.5 kg)    General:  WDWN  in NAD; vital signs documented above Gait: Normal HENT: WNL, normocephalic Pulmonary: normal non-labored breathing Cardiac: regular HR Abdomen: soft Vascular Exam/Pulses: 2+ femoral, Doppler right DP/ PT/ Pero, left PT/Pero signals. Feet warm and well perfused Extremities: without ischemic changes, without Gangrene , without cellulitis; with open wound of distal lateral right leg and mid medial right leg as shown below. Healthy appearing granulation tissue in wound beds  Lateral right leg  Mid medial right leg Musculoskeletal: no muscle wasting or atrophy  Neurologic: A&O X 3 Psychiatric:  The pt has Normal affect.  ASSESSMENT/PLAN:: 78 y.o. female here for follow up wound check. She has history of right CFA to peroneal artery bypass with PTFE and vein cuff on 11/22/2023 by Dr. Pearline for CLI with rest pain. She subsequently underwent debridement of right lower leg wound with application of Kercis and wound vac on 01/10/2024 also by Dr. Pearline. Her wounds continue to slowly improve. She is without claudication or rest pain. No new wounds. She has Doppler signals bilaterally - Continue follow up/ wound care management with Dr. Marolyn - continue Aspirin , Statin, Plavix  - I anticipate improvement of wounds enough to have non invasive studies at next follow up  - I will have her return in 6-8 weeks with RLE bypass graft duplex and ABI   Teretha Damme, PA-C [facility] [phone]  Clinic MD:   Pearline

## 2024-07-06 ENCOUNTER — Ambulatory Visit: Attending: Vascular Surgery | Admitting: Physician Assistant

## 2024-07-06 VITALS — BP 151/67 | HR 101 | Temp 97.7°F | Wt 186.3 lb

## 2024-07-06 DIAGNOSIS — I739 Peripheral vascular disease, unspecified: Secondary | ICD-10-CM

## 2024-07-06 DIAGNOSIS — Z95828 Presence of other vascular implants and grafts: Secondary | ICD-10-CM

## 2024-07-06 DIAGNOSIS — S81801S Unspecified open wound, right lower leg, sequela: Secondary | ICD-10-CM

## 2024-07-09 ENCOUNTER — Other Ambulatory Visit: Payer: Self-pay

## 2024-07-09 DIAGNOSIS — I739 Peripheral vascular disease, unspecified: Secondary | ICD-10-CM

## 2024-07-11 ENCOUNTER — Encounter (HOSPITAL_BASED_OUTPATIENT_CLINIC_OR_DEPARTMENT_OTHER): Admitting: Internal Medicine

## 2024-07-11 DIAGNOSIS — I739 Peripheral vascular disease, unspecified: Secondary | ICD-10-CM

## 2024-07-11 DIAGNOSIS — E11622 Type 2 diabetes mellitus with other skin ulcer: Secondary | ICD-10-CM | POA: Diagnosis not present

## 2024-07-14 ENCOUNTER — Other Ambulatory Visit: Payer: Self-pay | Admitting: Vascular Surgery

## 2024-07-17 ENCOUNTER — Encounter (HOSPITAL_BASED_OUTPATIENT_CLINIC_OR_DEPARTMENT_OTHER): Admitting: General Surgery

## 2024-07-17 DIAGNOSIS — I739 Peripheral vascular disease, unspecified: Secondary | ICD-10-CM

## 2024-07-17 DIAGNOSIS — E11622 Type 2 diabetes mellitus with other skin ulcer: Secondary | ICD-10-CM | POA: Diagnosis not present

## 2024-07-20 ENCOUNTER — Other Ambulatory Visit: Payer: Self-pay | Admitting: Family Medicine

## 2024-07-20 DIAGNOSIS — R1032 Left lower quadrant pain: Secondary | ICD-10-CM

## 2024-07-23 ENCOUNTER — Other Ambulatory Visit: Payer: Self-pay

## 2024-07-23 MED ORDER — APIXABAN 5 MG PO TABS: 5.0000 mg | ORAL_TABLET | Freq: Two times a day (BID) | ORAL | 1 refills | Status: AC

## 2024-07-24 ENCOUNTER — Encounter (HOSPITAL_BASED_OUTPATIENT_CLINIC_OR_DEPARTMENT_OTHER): Admitting: General Surgery

## 2024-07-24 DIAGNOSIS — I739 Peripheral vascular disease, unspecified: Secondary | ICD-10-CM

## 2024-07-24 DIAGNOSIS — E11622 Type 2 diabetes mellitus with other skin ulcer: Secondary | ICD-10-CM | POA: Diagnosis not present

## 2024-07-31 ENCOUNTER — Encounter (HOSPITAL_BASED_OUTPATIENT_CLINIC_OR_DEPARTMENT_OTHER): Admitting: General Surgery

## 2024-07-31 DIAGNOSIS — E11622 Type 2 diabetes mellitus with other skin ulcer: Secondary | ICD-10-CM | POA: Diagnosis not present

## 2024-07-31 DIAGNOSIS — I739 Peripheral vascular disease, unspecified: Secondary | ICD-10-CM

## 2024-08-07 ENCOUNTER — Encounter (HOSPITAL_BASED_OUTPATIENT_CLINIC_OR_DEPARTMENT_OTHER): Attending: General Surgery | Admitting: General Surgery

## 2024-08-07 DIAGNOSIS — I739 Peripheral vascular disease, unspecified: Secondary | ICD-10-CM | POA: Insufficient documentation

## 2024-08-07 DIAGNOSIS — E11622 Type 2 diabetes mellitus with other skin ulcer: Secondary | ICD-10-CM | POA: Insufficient documentation

## 2024-08-07 DIAGNOSIS — L97812 Non-pressure chronic ulcer of other part of right lower leg with fat layer exposed: Secondary | ICD-10-CM | POA: Insufficient documentation

## 2024-08-13 ENCOUNTER — Inpatient Hospital Stay
Admission: RE | Admit: 2024-08-13 | Discharge: 2024-08-13 | Disposition: A | Payer: Self-pay | Source: Ambulatory Visit | Attending: Family Medicine | Admitting: Family Medicine

## 2024-08-13 DIAGNOSIS — R1032 Left lower quadrant pain: Secondary | ICD-10-CM

## 2024-08-13 MED ORDER — IOPAMIDOL (ISOVUE-300) INJECTION 61%
80.0000 mL | Freq: Once | INTRAVENOUS | Status: AC | PRN
  Administered 2024-08-13: 80 mL via INTRAVENOUS

## 2024-08-14 ENCOUNTER — Encounter (HOSPITAL_BASED_OUTPATIENT_CLINIC_OR_DEPARTMENT_OTHER): Admitting: General Surgery

## 2024-08-14 DIAGNOSIS — E11622 Type 2 diabetes mellitus with other skin ulcer: Secondary | ICD-10-CM | POA: Diagnosis not present

## 2024-08-21 ENCOUNTER — Encounter (HOSPITAL_BASED_OUTPATIENT_CLINIC_OR_DEPARTMENT_OTHER): Admitting: General Surgery

## 2024-08-21 DIAGNOSIS — E11622 Type 2 diabetes mellitus with other skin ulcer: Secondary | ICD-10-CM | POA: Diagnosis not present

## 2024-08-24 ENCOUNTER — Ambulatory Visit (HOSPITAL_COMMUNITY)
Admission: RE | Admit: 2024-08-24 | Discharge: 2024-08-24 | Disposition: A | Source: Ambulatory Visit | Attending: Vascular Surgery

## 2024-08-24 ENCOUNTER — Ambulatory Visit: Admitting: Physician Assistant

## 2024-08-24 ENCOUNTER — Encounter: Payer: Self-pay | Admitting: Physician Assistant

## 2024-08-24 ENCOUNTER — Ambulatory Visit (HOSPITAL_BASED_OUTPATIENT_CLINIC_OR_DEPARTMENT_OTHER)
Admission: RE | Admit: 2024-08-24 | Discharge: 2024-08-24 | Disposition: A | Source: Ambulatory Visit | Attending: Vascular Surgery

## 2024-08-24 VITALS — BP 155/85 | HR 76 | Temp 97.9°F | Ht 67.0 in | Wt 178.0 lb

## 2024-08-24 DIAGNOSIS — S81801S Unspecified open wound, right lower leg, sequela: Secondary | ICD-10-CM | POA: Insufficient documentation

## 2024-08-24 DIAGNOSIS — Z95828 Presence of other vascular implants and grafts: Secondary | ICD-10-CM | POA: Diagnosis present

## 2024-08-24 DIAGNOSIS — I739 Peripheral vascular disease, unspecified: Secondary | ICD-10-CM

## 2024-08-24 LAB — VAS US ABI WITH/WO TBI: Left ABI: 0.88

## 2024-08-24 NOTE — Progress Notes (Signed)
 " Office Note     CC:  follow up Requesting Provider:  Yolande Toribio MATSU, MD  HPI: Catherine Ramsey is a 79 y.o. (07-30-1946) female who presents for follow up wound check and non invasive studies. She is s/p right CFA to peroneal artery bypass with PTFE and vein cuff on 11/22/2023 by Dr. Pearline for CLI with rest pain. She subsequently underwent debridement of right lower leg wound with application of Kercis and wound vac on 01/10/2024 also by Dr. Pearline. At her last visit her wounds were still open but improving under management of Dr. Marolyn at the [facility].    Today she is here with her Husband. She says overall she is doing well. She says occasionally when its really cold her right foot will hurt but otherwise she denies any pain on ambulation or rest. She continues to have some swelling on left leg but no pain in it either. She says she has been trying to do a lot of walking. She was most recently seen by Dr. Marolyn on Tuesday 1/20 and she feels her wounds are doing really good. The lateral wound is all healed and then medial wound is improving a lot. They will follow up with Dr. Marolyn in 1 week.  She is medically managed on Aspirin , statin, Eliquis .  Past Medical History:  Diagnosis Date   Achalasia - Type II 05/08/2010   Qualifier: Diagnosis of  By: Avram MD, NOLIA Pitts E    Acute kidney injury 02/26/2019   Candida esophagitis (HCC) 03/26/2014   Cataract    COVID-19 virus detected 02/25/2019   Diabetes mellitus    type 2   Family history of adverse reaction to anesthesia    son hard to wake up   GERD (gastroesophageal reflux disease)    Hyperlipidemia    Hypertension    Peripheral vascular disease    Primary hypertension 02/25/2019   Syncope 02/25/2019   Trigger ring finger of right hand 10/07/2016    Past Surgical History:  Procedure Laterality Date   ABDOMINAL AORTOGRAM N/A 11/16/2023   Procedure: ABDOMINAL AORTOGRAM;  Surgeon: Lanis Fonda BRAVO, MD;  Location: [facility]  INVASIVE CV LAB;  Service: Cardiovascular;  Laterality: N/A;   ABDOMINAL HYSTERECTOMY     APPLICATION OF WOUND VAC Right 01/10/2024   Procedure: APPLICATION, WOUND VAC;  Surgeon: Pearline Norman RAMAN, MD;  Location: [facility] OR;  Service: Vascular;  Laterality: Right;   APPLICATION, SKIN SUBSTITUTE Right 01/10/2024   Procedure: APPLICATION, SKIN SUBSTITUTE KERECIS 38 SQ CM;  Surgeon: Pearline Norman RAMAN, MD;  Location: [facility] OR;  Service: Vascular;  Laterality: Right;   BREAST BIOPSY     left/benign   BYPASS GRAFT FEMORAL-PERONEAL Right 11/22/2023   Procedure: CREATION, BYPASS, ARTERIAL, FEMORAL TO PERONEAL, USING PROPATEN X 80CM GRAFT;  Surgeon: Pearline Norman RAMAN, MD;  Location: [facility] OR;  Service: Vascular;  Laterality: Right;   COLONOSCOPY  01/31/2014   ESOPHAGEAL MANOMETRY N/A 04/01/2014   Procedure: ESOPHAGEAL MANOMETRY (EM);  Surgeon: Pitts BRAVO Avram, MD;  Location: [facility] ENDOSCOPY;  Service: Endoscopy;  Laterality: N/A;   HELLER MYOTOMY N/A 12/07/2016   Procedure: LAPAROSCOPIC HELLER MYOTOMY, DOR FUNDOPLICATION, HIATAL HERNIA REPAIR, INTRAOPERATIVE UPPER ENDOSCOPY;  Surgeon: Lily Boas, MD;  Location: [facility] ORS;  Service: General;  Laterality: N/A;   INCISION AND DRAINAGE OF WOUND Right 01/10/2024   Procedure: IRRIGATION AND DEBRIDEMENT WOUND;  Surgeon: Pearline Norman RAMAN, MD;  Location: [facility] OR;  Service: Vascular;  Laterality: Right;   IR ANGIOGRAM  PELVIS SELECTIVE OR SUPRASELECTIVE  10/20/2021   IR RADIOLOGIST EVAL & MGMT  10/01/2021   IR RADIOLOGIST EVAL & MGMT  11/25/2021   IR RADIOLOGIST EVAL & MGMT  11/04/2022   IR TIB-PERO ART ATHEREC INC PTA MOD SED  10/15/2021   IR US  GUIDE VASC ACCESS RIGHT  10/15/2021   LOWER EXTREMITY ANGIOGRAPHY Right 11/16/2023   Procedure: Lower Extremity Angiography;  Surgeon: Lanis Fonda BRAVO, MD;  Location: [facility] INVASIVE CV LAB;  Service: Cardiovascular;  Laterality: Right;   LOWER EXTREMITY INTERVENTION Right 11/16/2023   Procedure: LOWER EXTREMITY INTERVENTION;  Surgeon:  Lanis Fonda BRAVO, MD;  Location: [facility] INVASIVE CV LAB;  Service: Cardiovascular;  Laterality: Right;   PERIPHERAL VASCULAR THROMBECTOMY N/A 11/17/2023   Procedure: PERIPHERAL VASCULAR THROMBECTOMY;  Surgeon: Gretta Lonni PARAS, MD;  Location: [facility] INVASIVE CV LAB;  Service: Vascular;  Laterality: N/A;   TOTAL HIP ARTHROPLASTY     right pin then replacement   UPPER GASTROINTESTINAL ENDOSCOPY      Social History   Socioeconomic History   Marital status: Married    Spouse name: Not on file   Number of children: 8   Years of education: Not on file   Highest education level: Not on file  Occupational History   Not on file  Tobacco Use   Smoking status: Never   Smokeless tobacco: Never  Vaping Use   Vaping status: Never Used  Substance and Sexual Activity   Alcohol  use: No   Drug use: No   Sexual activity: Yes  Other Topics Concern   Not on file  Social History Narrative   Patient is married and retired   No alcohol  tobacco or drug use   Social Drivers of Health   Tobacco Use: Low Risk (08/24/2024)   Patient History    Smoking Tobacco Use: Never    Smokeless Tobacco Use: Never    Passive Exposure: Not on file  Financial Resource Strain: Not on file  Food Insecurity: No Food Insecurity (01/10/2024)   Hunger Vital Sign    Worried About Running Out of Food in the Last Year: Never true    Ran Out of Food in the Last Year: Never true  Transportation Needs: No Transportation Needs (01/10/2024)   PRAPARE - Administrator, Civil Service (Medical): No    Lack of Transportation (Non-Medical): No  Physical Activity: Not on file  Stress: Not on file  Social Connections: Socially Integrated (01/10/2024)   Social Connection and Isolation Panel    Frequency of Communication with Friends and Family: More than three times a week    Frequency of Social Gatherings with Friends and Family: More than three times a week    Attends Religious Services: More than 4 times per year     Active Member of [employer] or Organizations: Yes    Attends Banker Meetings: More than 4 times per year    Marital Status: Married  Catering Manager Violence: Not At Risk (01/11/2024)   Humiliation, Afraid, Rape, and Kick questionnaire    Fear of Current or Ex-Partner: No    Emotionally Abused: No    Physically Abused: No    Sexually Abused: No  Depression (PHQ2-9): Not on file  Alcohol  Screen: Not on file  Housing: Low Risk (01/10/2024)   Housing Stability Vital Sign    Unable to Pay for Housing in the Last Year: No    Number of Times Moved in the Last Year: 0    Homeless  in the Last Year: No  Utilities: Not At Risk (01/10/2024)   AHC Utilities    Threatened with loss of utilities: No  Health Literacy: Not on file    Family History  Problem Relation Age of Onset   Diabetes Mother        died at 64 from Covid-19   Hypertension Mother    Hyperlipidemia Mother    Breast cancer Mother    Diabetes Father    Hypertension Father    Hypertension Sister    Hypertension Sister    Colon polyps Daughter    Colon cancer Neg Hx    Esophageal cancer Neg Hx    Rectal cancer Neg Hx    Stomach cancer Neg Hx     Current Outpatient Medications  Medication Sig Dispense Refill   acetaminophen  (TYLENOL ) 500 MG tablet Take 1,000 mg by mouth every 6 (six) hours as needed for moderate pain.     Alcohol  Swabs (B-D SINGLE USE SWABS REGULAR) PADS      amLODipine  (NORVASC ) 10 MG tablet Take 10 mg by mouth daily.     apixaban  (ELIQUIS ) 5 MG TABS tablet Take 1 tablet (5 mg total) by mouth 2 (two) times daily. 180 tablet 1   aspirin  81 MG chewable tablet Chew 81 mg by mouth daily.     Blood Glucose Monitoring Suppl (TRUE METRIX AIR GLUCOSE METER) w/Device KIT      cilostazol  (PLETAL ) 50 MG tablet Take 1 tablet (50 mg total) by mouth 2 (two) times daily. Patient must call and schedule annual appointment for further refills first attempt 60 tablet 0   collagenase  (SANTYL ) 250 UNIT/GM ointment  Apply 1 Application topically daily. 30 g 1   ezetimibe  (ZETIA ) 10 MG tablet Take 1 tablet (10 mg total) by mouth every evening. 90 tablet 3   gabapentin  (NEURONTIN ) 100 MG capsule Take 1 capsule (100 mg total) by mouth 3 (three) times daily. 90 capsule 3   ibuprofen  (ADVIL ) 200 MG tablet Take 400 mg by mouth daily as needed for moderate pain.     insulin  aspart (NOVOLOG ) 100 UNIT/ML FlexPen Inject 10 Units into the skin 3 (three) times daily as needed for high blood sugar. Takes Novolog  10 units with meals (also takes a few extra units if glucose is elevated)     insulin  glargine (LANTUS ) 100 UNIT/ML injection Inject 19 Units into the skin daily.     Insulin  Syringe-Needle U-100 31G X 5/16 0.5 ML MISC Use one syringe twice daily DX:  250.70     KLOR-CON  M20 20 MEQ tablet Take 20 mEq by mouth daily.  0   losartan -hydrochlorothiazide  (HYZAAR) 50-12.5 MG tablet Take 1 tablet by mouth every morning.     omeprazole (PRILOSEC) 20 MG capsule Take 20 mg by mouth daily as needed (acid reflux).     rosuvastatin  (CRESTOR ) 20 MG tablet TAKE 1 TABLET EVERY DAY (DISCONTINUE PRAVASTATIN ) 90 tablet 0   traMADol  (ULTRAM ) 50 MG tablet Take 1 tablet (50 mg total) by mouth every 6 (six) hours as needed. 20 tablet 0   TRUE METRIX BLOOD GLUCOSE TEST test strip      TRUEplus Lancets 33G MISC      No current facility-administered medications for this visit.    Allergies[1]   REVIEW OF SYSTEMS:  Negative unless noted in HPI [X]  denotes positive finding, [ ]  denotes negative finding Cardiac  Comments:  Chest pain or chest pressure:    Shortness of breath upon exertion:  Short of breath when lying flat:    Irregular heart rhythm:        Vascular    Pain in calf, thigh, or hip brought on by ambulation:    Pain in feet at night that wakes you up from your sleep:     Blood clot in your veins:    Leg swelling:         Pulmonary    Oxygen at home:    Productive cough:     Wheezing:         Neurologic     Sudden weakness in arms or legs:     Sudden numbness in arms or legs:     Sudden onset of difficulty speaking or slurred speech:    Temporary loss of vision in one eye:     Problems with dizziness:         Gastrointestinal    Blood in stool:     Vomited blood:         Genitourinary    Burning when urinating:     Blood in urine:        Psychiatric    Major depression:         Hematologic    Bleeding problems:    Problems with blood clotting too easily:        Skin    Rashes or ulcers:        Constitutional    Fever or chills:      PHYSICAL EXAMINATION:  Vitals:   08/24/24 1231  BP: (!) 155/85  Pulse: 76  Temp: 97.9 F (36.6 C)  SpO2: 98%  Weight: 178 lb (80.7 kg)  Height: 5' 7 (1.702 m)    General:  WDWN in NAD; vital signs documented above Gait: normal HENT: WNL, normocephalic Pulmonary: normal non-labored breathing Cardiac: regular HR Abdomen: soft Vascular Exam/Pulses: 2+ femoral, no palpable distal pulses. Feet warm and well perfused Extremities: without ischemic changes, without Gangrene , without cellulitis; with open wound of right medical calf along incision line as shown below. Almost completely healed. Right lateral distal leg wound healed  Medial wound above  Right lateral distal leg wound well healed Musculoskeletal: no muscle wasting or atrophy  Neurologic: A&O X 3 Psychiatric:  The pt has Normal affect.   Non-Invasive Vascular Imaging:   +-------+----------------+-----------+------------+------------+  ABI/TBIToday's ABI     Today's TBIPrevious ABIPrevious TBI  +-------+----------------+-----------+------------+------------+  Right non-compressible.36        .07         0             +-------+----------------+-----------+------------+------------+  Left  .88             .22        .70         0             +-------+----------------+-----------+------------+------------+   Bilateral ABIs and TBIs appear increased  compared to prior study on 11/15/2023.     VAS US  Lower Extremity Bypass graft duplex: Summary:  Right: Widely patent CFA to peroneal artery bypass graft without evidence of stenosis. Occluded distal PTA.    ASSESSMENT/PLAN:: 79 y.o. female presents  for follow up wound check and non invasive studies. She is s/p right CFA to peroneal artery bypass with PTFE and vein cuff on 11/22/2023 by Dr. Pearline for CLI with rest pain. She subsequently underwent debridement of right lower leg wound with application of Kercis and wound vac on 01/10/2024 also by  Dr. Pearline. Her wounds are almost both completely healed. Just the right medical incisional wound still open. Seeing Dr. Marolyn weekly for wound management. - Duplex today shows patent right CF to peroneal bypass - ABI's are improved bilaterally - She has known single vessel peroneal runoff - continue Aspirin , Statin, Eliquis  - she will continue wound care with Dr. Marolyn - Follow up in 3 months with RLE bypass graft duplex and ABI   Teretha Damme, PA-C [facility] [phone]  Clinic MD:  Pearline      [1]  Allergies Allergen Reactions   Lovastatin Itching and Rash   Nitrofurantoin Rash   "

## 2024-08-28 ENCOUNTER — Encounter (HOSPITAL_BASED_OUTPATIENT_CLINIC_OR_DEPARTMENT_OTHER): Admitting: General Surgery

## 2024-08-28 DIAGNOSIS — E11622 Type 2 diabetes mellitus with other skin ulcer: Secondary | ICD-10-CM | POA: Diagnosis not present

## 2024-09-04 ENCOUNTER — Encounter (HOSPITAL_BASED_OUTPATIENT_CLINIC_OR_DEPARTMENT_OTHER): Admitting: General Surgery

## 2024-09-11 ENCOUNTER — Encounter (HOSPITAL_BASED_OUTPATIENT_CLINIC_OR_DEPARTMENT_OTHER): Admitting: General Surgery

## 2024-11-16 ENCOUNTER — Ambulatory Visit

## 2024-11-16 ENCOUNTER — Ambulatory Visit (HOSPITAL_COMMUNITY)
